# Patient Record
Sex: Female | Born: 1990 | Race: Black or African American | Hispanic: No | Marital: Married | Smoking: Never smoker
Health system: Southern US, Community
[De-identification: ages and names within clinical notes are randomized; demographics above are authoritative.]

## PROBLEM LIST (undated history)

## (undated) ENCOUNTER — Inpatient Hospital Stay (HOSPITAL_COMMUNITY): Payer: Self-pay

## (undated) DIAGNOSIS — I10 Essential (primary) hypertension: Secondary | ICD-10-CM

## (undated) HISTORY — PX: NO PAST SURGERIES: SHX2092

## (undated) HISTORY — PX: BONE MARROW TRANSPLANT: SHX200

---

## 2012-05-07 ENCOUNTER — Encounter (HOSPITAL_COMMUNITY): Payer: Self-pay | Admitting: *Deleted

## 2012-05-07 ENCOUNTER — Emergency Department (HOSPITAL_COMMUNITY)
Admission: EM | Admit: 2012-05-07 | Discharge: 2012-05-07 | Disposition: A | Discharge status: Home / Self Care | Payer: Self-pay | Attending: Emergency Medicine | Admitting: Emergency Medicine

## 2012-05-07 DIAGNOSIS — IMO0002 Reserved for concepts with insufficient information to code with codable children: Secondary | ICD-10-CM | POA: Insufficient documentation

## 2012-05-07 DIAGNOSIS — L0390 Cellulitis, unspecified: Secondary | ICD-10-CM

## 2012-05-07 MED ORDER — CEPHALEXIN 250 MG PO CAPS
500.0000 mg | ORAL_CAPSULE | Freq: Once | ORAL | Status: AC
  Administered 2012-05-07: 500 mg via ORAL
  Filled 2012-05-07: qty 2

## 2012-05-07 MED ORDER — CEPHALEXIN 500 MG PO CAPS: 500.0000 mg | ORAL_CAPSULE | Freq: Four times a day (QID) | ORAL | Status: DC

## 2012-05-07 NOTE — ED Provider Notes (Signed)
History   This chart was scribed for Diamond Robbins B. Bernette Mayers, MD, by Frederik Pear, ER scribe. The patient was seen in room TR06C/TR06C and the patient's care was started at 1341.    CSN: [id number]  Arrival date & time 05/07/12  1316   First MD Initiated Contact with Patient 05/07/12 1341      Chief Complaint  Patient presents with  . redness and swelling to right arm     (Consider location/radiation/quality/duration/timing/severity/associated sxs/prior treatment) HPI  Diamond Robbins is a 21 y.o. female who presents to the Emergency Department complaining of constant, moderate right forearm pain with associated erythema and swelling that began when she awoke. She denies any known insect bites or injuries to the area.She denies any chronic health conditions including DM or taking any daily medications She denies any allergies to medications.  History reviewed. No pertinent past medical history.  History reviewed. No pertinent past surgical history.  No family history on file.  History  Substance Use Topics  . Smoking status: Never Smoker   . Smokeless tobacco: Not on file  . Alcohol Use: No    OB History    Grav Para Term Preterm Abortions TAB SAB Ect Mult Living                  Review of Systems A complete 10 system review of systems was obtained and all systems are negative except as noted in the HPI and PMH.   Allergies  Review of patient's allergies indicates no known allergies.  Home Medications  No current outpatient prescriptions on file.  BP 155/105  Pulse 71  Temp 98.3 F (36.8 C) (Oral)  Resp 18  SpO2 99%  LMP 05/05/2012  Physical Exam  Constitutional: She is oriented to person, place, and time. She appears well-developed and well-nourished.  HENT:  Head: Normocephalic and atraumatic.  Neck: Neck supple.  Pulmonary/Chest: Effort normal.  Neurological: She is alert and oriented to person, place, and time. No cranial nerve deficit.  Skin:   She has an area that is erythemas and warm with no fluctuants or streaking to the right forearm.  Psychiatric: She has a normal mood and affect. Her behavior is normal.    ED Course  Procedures (including critical care time)  DIAGNOSTIC STUDIES: Oxygen Saturation is 99% on room air, normal by my interpretation.    COORDINATION OF CARE:  13:52- Discussed planned course of treatment with the patient, including a course of antibiotics and coming back to the ED if the symptoms worsen, who is agreeable at this time.   Labs Reviewed - No data to display No results found.   No diagnosis found.    MDM  Cellulitis of R forearm. Erythema margin is not well demarcated, but marked as best as we could tell. Will begin Keflex, advised ED follow up if not resolved.   I personally performed the services described in this documentation, which was scribed in my presence. The recorded information has been reviewed and is accurate.        Sostenes Kauffmann B. Bernette Mayers, MD 05/07/12 1357

## 2012-05-07 NOTE — ED Notes (Signed)
Pt is here with right forearm redness, swelling and pain that she woke up with.  Pulse is present.

## 2012-05-07 NOTE — Discharge Instructions (Signed)
Cellulitis Cellulitis is an infection of the skin and the tissue beneath it. The infected area is usually red and tender. Cellulitis occurs most often in the arms and lower legs.   CAUSES   Cellulitis is caused by bacteria that enter the skin through cracks or cuts in the skin. The most common types of bacteria that cause cellulitis are Staphylococcus and Streptococcus. SYMPTOMS    Redness and warmth.   Swelling.   Tenderness or pain.   Fever.  DIAGNOSIS  Your caregiver can usually determine what is wrong based on a physical exam. Blood tests may also be done. TREATMENT   Treatment usually involves taking an antibiotic medicine. HOME CARE INSTRUCTIONS    Take your antibiotics as directed. Finish them even if you start to feel better.   Keep the infected arm or leg elevated to reduce swelling.   Apply a warm cloth to the affected area up to 4 times per day to relieve pain.   Only take over-the-counter or prescription medicines for pain, discomfort, or fever as directed by your caregiver.   Keep all follow-up appointments as directed by your caregiver.  SEEK MEDICAL CARE IF:    You notice red streaks coming from the infected area.   Your red area gets larger or turns dark in color.   Your bone or joint underneath the infected area becomes painful after the skin has healed.   Your infection returns in the same area or another area.   You notice a swollen bump in the infected area.   You develop new symptoms.  SEEK IMMEDIATE MEDICAL CARE IF:    You have a fever.   You feel very sleepy.   You develop vomiting or diarrhea.   You have a general ill feeling (malaise) with muscle aches and pains.  MAKE SURE YOU:    Understand these instructions.   Will watch your condition.   Will get help right away if you are not doing well or get worse.  Document Released: 02/09/2005 Document Revised: 11/01/2011 Document Reviewed: 07/18/2011 [employer] Patient Information 2013  [employer]    

## 2013-03-07 ENCOUNTER — Encounter (HOSPITAL_COMMUNITY): Payer: Self-pay | Admitting: Emergency Medicine

## 2013-03-07 ENCOUNTER — Emergency Department (HOSPITAL_COMMUNITY)
Admission: EM | Admit: 2013-03-07 | Discharge: 2013-03-07 | Disposition: A | Discharge status: Home / Self Care | Payer: No Typology Code available for payment source | Attending: Emergency Medicine | Admitting: Emergency Medicine

## 2013-03-07 DIAGNOSIS — Y92410 Unspecified street and highway as the place of occurrence of the external cause: Secondary | ICD-10-CM | POA: Insufficient documentation

## 2013-03-07 DIAGNOSIS — M791 Myalgia, unspecified site: Secondary | ICD-10-CM

## 2013-03-07 DIAGNOSIS — Y939 Activity, unspecified: Secondary | ICD-10-CM | POA: Insufficient documentation

## 2013-03-07 DIAGNOSIS — M546 Pain in thoracic spine: Secondary | ICD-10-CM | POA: Insufficient documentation

## 2013-03-07 MED ORDER — IBUPROFEN 400 MG PO TABS
800.0000 mg | ORAL_TABLET | Freq: Once | ORAL | Status: AC
  Administered 2013-03-07: 800 mg via ORAL
  Filled 2013-03-07: qty 2

## 2013-03-07 MED ORDER — NAPROXEN 500 MG PO TABS: 500.0000 mg | ORAL_TABLET | Freq: Two times a day (BID) | ORAL | Status: DC

## 2013-03-07 MED ORDER — CYCLOBENZAPRINE HCL 10 MG PO TABS: 10.0000 mg | ORAL_TABLET | Freq: Two times a day (BID) | ORAL | Status: DC | PRN
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
## 2013-03-08 NOTE — ED Provider Notes (Signed)
Medical screening examination/treatment/procedure(s) were performed by non-physician practitioner and as supervising physician I was immediately available for consultation/collaboration.  EKG Interpretation   None         Layla Maw Raiyah Speakman, DO 03/08/13 2300

## 2016-05-31 ENCOUNTER — Encounter (HOSPITAL_COMMUNITY): Payer: Self-pay | Admitting: *Deleted

## 2016-06-23 ENCOUNTER — Ambulatory Visit (HOSPITAL_COMMUNITY): Payer: Self-pay

## 2016-07-07 ENCOUNTER — Ambulatory Visit (HOSPITAL_COMMUNITY): Payer: Self-pay

## 2016-07-11 ENCOUNTER — Encounter: Payer: Self-pay | Admitting: Obstetrics and Gynecology

## 2016-08-03 ENCOUNTER — Telehealth (HOSPITAL_COMMUNITY): Payer: Self-pay | Admitting: *Deleted

## 2016-08-03 NOTE — Telephone Encounter (Signed)
Telephoned patient at home number and left message to return call to [facility] 

## 2016-09-06 ENCOUNTER — Encounter (HOSPITAL_COMMUNITY): Payer: Self-pay

## 2016-09-06 ENCOUNTER — Ambulatory Visit (HOSPITAL_COMMUNITY)
Admission: RE | Admit: 2016-09-06 | Discharge: 2016-09-06 | Disposition: A | Discharge status: Home / Self Care | Payer: Self-pay | Source: Ambulatory Visit | Attending: Obstetrics and Gynecology | Admitting: Obstetrics and Gynecology

## 2016-09-06 VITALS — BP 160/100 | Ht 64.0 in | Wt 169.0 lb

## 2016-09-06 DIAGNOSIS — R87810 Cervical high risk human papillomavirus (HPV) DNA test positive: Secondary | ICD-10-CM

## 2016-09-06 DIAGNOSIS — Z1239 Encounter for other screening for malignant neoplasm of breast: Secondary | ICD-10-CM

## 2016-09-06 DIAGNOSIS — R87610 Atypical squamous cells of undetermined significance on cytologic smear of cervix (ASC-US): Secondary | ICD-10-CM

## 2016-09-06 HISTORY — DX: Essential (primary) hypertension: I10

## 2016-09-06 NOTE — Patient Instructions (Signed)
Explained breast self awareness with [name]. Patient did not need a Pap smear today due to last Pap smear was 05/18/2016. Patient referred to the Center for [facility] at [facility] for a colpscopy. Appointment scheduled for Tuesday, Sep 13, 2016 at 1440. Patient aware of appointment and will be there. Let patient know that a screening mammogram is recommended at age 26 unless clinically indicated prior.  Diamond Robbins verbalized understanding.  [name], Arvil Chaco, RN 3:17 PM

## 2016-09-06 NOTE — Progress Notes (Signed)
Patient referred to [place] by the [facility] Department due to having an abnormal Pap smear 05/18/2016 that a colposcopy is recommended for follow up.   Pap Smear: Pap smear not completed today. Last Pap smear was 05/18/2016 at the [facility] Department and ASCUS with positive HPV. Patient referred to the Center for [facility] at [facility] for a colpscopy. Appointment scheduled for Tuesday, Sep 13, 2016 at 1440. Per patient has no history of abnormal Pap smears prior to the most recent Pap smear. Last Pap smear result is in EPIC.  Physical exam: Breasts Breasts symmetrical. No skin abnormalities bilateral breasts. No nipple retraction bilateral breasts. No nipple discharge bilateral breasts. No lymphadenopathy. No lumps palpated bilateral breasts. No complaints of pain or tenderness on exam. Screening mammogram recommended at age 26 unless clinically indicated prior.     Pelvic/Bimanual No Pap smear completed today since last Pap smear was 05/18/2016. Pap smear not indicated per [facility] guidelines.   Smoking History: Patient has never smoked.  Patient Navigation: Patient education provided. Access to services provided for patient through [facility] program.

## 2016-09-07 ENCOUNTER — Encounter (HOSPITAL_COMMUNITY): Payer: Self-pay | Admitting: *Deleted

## 2016-09-13 ENCOUNTER — Ambulatory Visit: Payer: Self-pay | Admitting: Obstetrics and Gynecology

## 2016-10-11 ENCOUNTER — Ambulatory Visit: Payer: Self-pay | Admitting: Family Medicine

## 2016-11-30 ENCOUNTER — Encounter (HOSPITAL_COMMUNITY): Payer: Self-pay | Admitting: *Deleted

## 2016-11-30 NOTE — Progress Notes (Signed)
Letter mailed to patient concerning missed colpo appointment.

## 2019-03-30 ENCOUNTER — Emergency Department (HOSPITAL_COMMUNITY)
Admission: EM | Admit: 2019-03-30 | Discharge: 2019-03-30 | Disposition: A | Discharge status: Home / Self Care | Payer: Medicaid Other | Attending: Emergency Medicine | Admitting: Emergency Medicine

## 2019-03-30 ENCOUNTER — Encounter (HOSPITAL_COMMUNITY): Payer: Self-pay | Admitting: Emergency Medicine

## 2019-03-30 ENCOUNTER — Other Ambulatory Visit: Payer: Self-pay

## 2019-03-30 DIAGNOSIS — S3992XA Unspecified injury of lower back, initial encounter: Secondary | ICD-10-CM | POA: Diagnosis present

## 2019-03-30 DIAGNOSIS — M546 Pain in thoracic spine: Secondary | ICD-10-CM | POA: Insufficient documentation

## 2019-03-30 DIAGNOSIS — I10 Essential (primary) hypertension: Secondary | ICD-10-CM | POA: Diagnosis not present

## 2019-03-30 DIAGNOSIS — S39012A Strain of muscle, fascia and tendon of lower back, initial encounter: Secondary | ICD-10-CM | POA: Insufficient documentation

## 2019-03-30 DIAGNOSIS — Z79899 Other long term (current) drug therapy: Secondary | ICD-10-CM | POA: Insufficient documentation

## 2019-03-30 DIAGNOSIS — W01198A Fall on same level from slipping, tripping and stumbling with subsequent striking against other object, initial encounter: Secondary | ICD-10-CM | POA: Diagnosis not present

## 2019-03-30 DIAGNOSIS — W19XXXA Unspecified fall, initial encounter: Secondary | ICD-10-CM | POA: Diagnosis not present

## 2019-03-30 DIAGNOSIS — Y92008 Other place in unspecified non-institutional (private) residence as the place of occurrence of the external cause: Secondary | ICD-10-CM | POA: Diagnosis not present

## 2019-03-30 DIAGNOSIS — Y9389 Activity, other specified: Secondary | ICD-10-CM | POA: Insufficient documentation

## 2019-03-30 DIAGNOSIS — Y999 Unspecified external cause status: Secondary | ICD-10-CM | POA: Diagnosis not present

## 2019-03-30 DIAGNOSIS — M545 Low back pain: Secondary | ICD-10-CM | POA: Diagnosis not present

## 2019-03-30 DIAGNOSIS — R52 Pain, unspecified: Secondary | ICD-10-CM | POA: Diagnosis not present

## 2019-03-30 DIAGNOSIS — T148XXA Other injury of unspecified body region, initial encounter: Secondary | ICD-10-CM

## 2019-03-30 MED ORDER — KETOROLAC TROMETHAMINE 60 MG/2ML IM SOLN
30.0000 mg | Freq: Once | INTRAMUSCULAR | Status: AC
  Administered 2019-03-30: 30 mg via INTRAMUSCULAR
  Filled 2019-03-30: qty 2

## 2019-03-30 NOTE — ED Triage Notes (Signed)
Patient here from home with complaints of lower back pain. Reports that she slipped on water in kitchen.

## 2019-03-30 NOTE — ED Provider Notes (Signed)
[facility] DEPT Provider Note  CSN: [id number] Arrival date & time: 03/30/19 0126  Chief Complaint(s) Back Pain  HPI Diamond Robbins is a 28 y.o. female who presents to the emergency department with mid and lower back pain following mechanical fall at home.  Patient reports that she slipped on wet floor causing her to fall backwards.  Pain is aching in nature, moderate to severe.  Exacerbated with movement and palpation.  Localized to bilateral paraspinal musculature.  No lower extremity weakness or loss of sensation.  No pain radiation to the lower extremities.  No bladder/bowel incontinence.  Patient has not tried taking any medicine for the pain.  Denied any head trauma or loss of consciousness.  No other physical complaints.  HPI  Past Medical History Past Medical History:  Diagnosis Date  . Hypertension    There are no active problems to display for this patient.  Home Medication(s) Prior to Admission medications   Medication Sig Start Date End Date Taking? Authorizing Provider  ibuprofen (ADVIL) 200 MG tablet Take 400 mg by mouth every 6 (six) hours as needed for moderate pain.   Yes [provider]  cyclobenzaprine (FLEXERIL) 10 MG tablet Take 1 tablet (10 mg total) by mouth 2 (two) times daily as needed for muscle spasms. Patient not taking: Reported on 09/06/2016 03/07/13   Sciacca, Mable Fill, PA-C  naproxen (NAPROSYN) 500 MG tablet Take 1 tablet (500 mg total) by mouth 2 (two) times daily. Patient not taking: Reported on 09/06/2016 03/07/13   Jamse Mead, PA-C                                                                                                                                    Past Surgical History History reviewed. No pertinent surgical history. Family History Family History  Problem Relation Age of Onset  . Hypertension Mother   . Hypertension Father   . Diabetes Maternal Grandmother     Social History Social  History   Tobacco Use  . Smoking status: Never Smoker  . Smokeless tobacco: Never Used  Substance Use Topics  . Alcohol use: Yes    Comment: occ  . Drug use: No   Allergies Patient has no known allergies.  Review of Systems Review of Systems All other systems are reviewed and are negative for acute change except as noted in the HPI  Physical Exam Vital Signs  I have reviewed the triage vital signs BP (!) 168/108 (BP Location: Left Arm)   Pulse 93   Temp 98.4 F (36.9 C) (Oral)   Resp 20   SpO2 100%   Physical Exam Vitals signs reviewed.  Constitutional:      General: She is not in acute distress.    Appearance: She is well-developed. She is not diaphoretic.  HENT:     Head: Normocephalic and atraumatic.     Right Ear: External ear normal.  Left Ear: External ear normal.     Nose: Nose normal.  Eyes:     General: No scleral icterus.    Conjunctiva/sclera: Conjunctivae normal.  Neck:     Musculoskeletal: Normal range of motion.     Trachea: Phonation normal.  Cardiovascular:     Rate and Rhythm: Normal rate and regular rhythm.  Pulmonary:     Effort: Pulmonary effort is normal. No respiratory distress.     Breath sounds: No stridor.  Abdominal:     General: There is no distension.  Musculoskeletal: Normal range of motion.     Thoracic back: She exhibits tenderness and spasm. She exhibits no bony tenderness.     Lumbar back: She exhibits tenderness and spasm. She exhibits no bony tenderness.       Back:  Neurological:     Mental Status: She is alert and oriented to person, place, and time.     Comments: Spine Exam: Strength: 5/5 throughout LE bilaterally (hip flexion/extension, adduction/abduction; knee flexion/extension; foot dorsiflexion/plantarflexion, inversion/eversion; great toe inversion) Sensation: Intact to light touch in proximal and distal LE bilaterally Reflexes: 1+ quadriceps and achilles reflexes   Psychiatric:        Behavior: Behavior  normal.     ED Results and Treatments Labs (all labs ordered are listed, but only abnormal results are displayed) Labs Reviewed - No data to display                                                                                                                       EKG  EKG Interpretation  Date/Time:    Ventricular Rate:    PR Interval:    QRS Duration:   QT Interval:    QTC Calculation:   R Axis:     Text Interpretation:        Radiology No results found.  Pertinent labs & imaging results that were available during my care of the patient were reviewed by me and considered in my medical decision making (see chart for details).  Medications Ordered in ED Medications  ketorolac (TORADOL) injection 30 mg (30 mg Intramuscular Given 03/30/19 0303)                                                                                                                                    Procedures Procedures  (including critical care time)  Medical Decision Making / ED Course I  have reviewed the nursing notes for this encounter and the patient's prior records (if available in EHR or on provided paperwork).   Diamond Robbins was evaluated in Emergency Department on 03/30/2019 for the symptoms described in the history of present illness. She was evaluated in the context of the global COVID-19 pandemic, which necessitated consideration that the patient might be at risk for infection with the SARS-CoV-2 virus that causes COVID-19. Institutional protocols and algorithms that pertain to the evaluation of patients at risk for COVID-19 are in a state of rapid change based on information released by regulatory bodies including the CDC and federal and state organizations. These policies and algorithms were followed during the patient's care in the ED.  28 y.o. female presents with back pain in thoracolumbar area for 2 hrs without signs of radicular pain.  No red flag symptoms of fever, weight loss,  saddle anesthesia, weakness, fecal/urinary incontinence or urinary retention.   Suspect muscular etiology. No indication for imaging emergently.   IM toradol provided with near complete resolution of pain. Sleeping comfortably on reassessment.  Patient was recommended to take short course of scheduled NSAIDs and engage in early mobility as definitive treatment. Return precautions discussed for worsening or new concerning symptoms.        Final Clinical Impression(s) / ED Diagnoses Final diagnoses:  Fall, initial encounter  Muscle strain    The patient appears reasonably screened and/or stabilized for discharge and I doubt any other medical condition or other [facility] requiring further screening, evaluation, or treatment in the ED at this time prior to discharge.  Disposition: Discharge  Condition: Good  I have discussed the results, Dx and Tx plan with the patient who expressed understanding and agree(s) with the plan. Discharge instructions discussed at great length. The patient was given strict return precautions who verbalized understanding of the instructions. No further questions at time of discharge.    ED Discharge Orders    None        Follow Up: Primary care provider  Schedule an appointment as soon as possible for a visit  As needed      This chart was dictated using voice recognition software.  Despite best efforts to proofread,  errors can occur which can change the documentation meaning.   Fatima Blank, MD 03/30/19 [phone]

## 2019-03-30 NOTE — Discharge Instructions (Addendum)
You may use over-the-counter Motrin (Ibuprofen), Acetaminophen (Tylenol), topical muscle creams such as SalonPas, Icy Hot, Bengay, etc. Please stretch, apply heat, and have massage therapy for additional assistance. ° °

## 2019-05-24 ENCOUNTER — Inpatient Hospital Stay (HOSPITAL_COMMUNITY): Payer: Medicaid Other

## 2019-05-24 ENCOUNTER — Other Ambulatory Visit: Payer: Self-pay

## 2019-05-24 ENCOUNTER — Encounter (HOSPITAL_COMMUNITY): Payer: Self-pay | Admitting: Obstetrics and Gynecology

## 2019-05-24 ENCOUNTER — Inpatient Hospital Stay (HOSPITAL_COMMUNITY)
Admission: AD | Admit: 2019-05-24 | Discharge: 2019-05-25 | Disposition: A | Discharge status: Home / Self Care | Payer: Medicaid Other | Attending: Obstetrics and Gynecology | Admitting: Obstetrics and Gynecology

## 2019-05-24 DIAGNOSIS — O209 Hemorrhage in early pregnancy, unspecified: Secondary | ICD-10-CM | POA: Insufficient documentation

## 2019-05-24 DIAGNOSIS — Z8249 Family history of ischemic heart disease and other diseases of the circulatory system: Secondary | ICD-10-CM | POA: Diagnosis not present

## 2019-05-24 DIAGNOSIS — O200 Threatened abortion: Secondary | ICD-10-CM | POA: Diagnosis not present

## 2019-05-24 DIAGNOSIS — Z3A01 Less than 8 weeks gestation of pregnancy: Secondary | ICD-10-CM | POA: Diagnosis not present

## 2019-05-24 DIAGNOSIS — O10911 Unspecified pre-existing hypertension complicating pregnancy, first trimester: Secondary | ICD-10-CM | POA: Insufficient documentation

## 2019-05-24 DIAGNOSIS — Z79899 Other long term (current) drug therapy: Secondary | ICD-10-CM | POA: Insufficient documentation

## 2019-05-24 DIAGNOSIS — O3411 Maternal care for benign tumor of corpus uteri, first trimester: Secondary | ICD-10-CM | POA: Diagnosis not present

## 2019-05-24 DIAGNOSIS — O4690 Antepartum hemorrhage, unspecified, unspecified trimester: Secondary | ICD-10-CM

## 2019-05-24 DIAGNOSIS — Z3A10 10 weeks gestation of pregnancy: Secondary | ICD-10-CM | POA: Insufficient documentation

## 2019-05-24 DIAGNOSIS — A599 Trichomoniasis, unspecified: Secondary | ICD-10-CM

## 2019-05-24 DIAGNOSIS — E1059 Type 1 diabetes mellitus with other circulatory complications: Secondary | ICD-10-CM | POA: Diagnosis present

## 2019-05-24 DIAGNOSIS — D252 Subserosal leiomyoma of uterus: Secondary | ICD-10-CM | POA: Diagnosis not present

## 2019-05-24 DIAGNOSIS — I10 Essential (primary) hypertension: Secondary | ICD-10-CM | POA: Diagnosis present

## 2019-05-24 DIAGNOSIS — I152 Hypertension secondary to endocrine disorders: Secondary | ICD-10-CM | POA: Diagnosis present

## 2019-05-24 LAB — CBC
HCT: 32.7 % — ABNORMAL LOW (ref 36.0–46.0)
Hemoglobin: 10.7 g/dL — ABNORMAL LOW (ref 12.0–15.0)
MCH: 27.8 pg (ref 26.0–34.0)
MCHC: 32.7 g/dL (ref 30.0–36.0)
MCV: 84.9 fL (ref 80.0–100.0)
Platelets: 291 10*3/uL (ref 150–400)
RBC: 3.85 MIL/uL — ABNORMAL LOW (ref 3.87–5.11)
RDW: 15.3 % (ref 11.5–15.5)
WBC: 8.7 10*3/uL (ref 4.0–10.5)
nRBC: 0 % (ref 0.0–0.2)

## 2019-05-24 LAB — URINALYSIS, ROUTINE W REFLEX MICROSCOPIC
Bilirubin Urine: NEGATIVE
Glucose, UA: 150 mg/dL — AB
Ketones, ur: 5 mg/dL — AB
Leukocytes,Ua: NEGATIVE
Nitrite: NEGATIVE
Protein, ur: 100 mg/dL — AB
RBC / HPF: >50 RBC/hpf — ABNORMAL HIGH (ref 0–5)
Specific Gravity, Urine: 1.027 (ref 1.005–1.030)
pH: 6 (ref 5.0–8.0)

## 2019-05-24 LAB — WET PREP, GENITAL
Sperm: NONE SEEN
Yeast Wet Prep HPF POC: NONE SEEN

## 2019-05-24 LAB — ABO/RH: ABO/RH(D): B POS

## 2019-05-24 LAB — HCG, QUANTITATIVE, PREGNANCY: hCG, Beta Chain, Quant, S: 1104 m[IU]/mL — ABNORMAL HIGH (ref <5)

## 2019-05-24 LAB — POCT PREGNANCY, URINE: Preg Test, Ur: POSITIVE — AB

## 2019-05-24 MED ORDER — LABETALOL HCL 100 MG PO TABS
200.0000 mg | ORAL_TABLET | Freq: Once | ORAL | Status: AC
  Administered 2019-05-24: 200 mg via ORAL
  Filled 2019-05-24: qty 2

## 2019-05-24 MED ORDER — METRONIDAZOLE 500 MG PO TABS
2000.0000 mg | ORAL_TABLET | Freq: Once | ORAL | Status: AC
  Administered 2019-05-24: 2000 mg via ORAL
  Filled 2019-05-24: qty 4

## 2019-05-24 NOTE — MAU Note (Addendum)
Has 1st appt with Ione on 05-31-2019.  Pink spotting x2 days then today at 7:30 pm, crampy pains and then went to bathroom and bleeding increased like starting period.  Takes Labetalol 100 mg twice a day - started in November - prescribed by Urgent Care.  Took her morning dose but hasn't took evening dose yet.

## 2019-05-24 NOTE — MAU Provider Note (Addendum)
History     CSN: [id number]  Arrival date and time: 05/24/19 2036   First Provider Initiated Contact with Patient 05/24/19 2211      Chief Complaint  Patient presents with  . Vaginal Bleeding   Diamond Robbins is a 29 y.o. G1P0 at 10.1 by Definite LMP weeks who plans to receive care at [facility] starting May 31, 2019.  She presents today for Vaginal Bleeding.  She states she was having dinner with her BF and started having stomach pain.  She states she went to the bathroom and noticed blood in the toilet.  She states that the amount was similar to starting a cycle.  She states the pain was located in her lower abdomen and is described as an "intense cramp."  She states the pain was "continuous for awhile and has eased up and now goes and comes and is real mild right now."  Patient rates pain a 3/10 currently, but reports it was a 7/10 when the bleeding started.  She denies vaginal discharge prior to the bleeding and has had no sexual activity in the past 3 days.  She also denies visual disturbances, headaches, or SOB. Patient endorses a history of CHTN and states she is on labetalol 100mg  bid.  She reports she took her initial dose at 0800, but has not taken her 2nd dose this evening.       OB History    Gravida  1   Para  0   Term  0   Preterm  0   AB  0   Living  0     SAB  0   TAB  0   Ectopic  0   Multiple  0   Live Births  0           Past Medical History:  Diagnosis Date  . Hypertension     Past Surgical History:  Procedure Laterality Date  . NO PAST SURGERIES      Family History  Problem Relation Age of Onset  . Hypertension Mother   . Hypertension Father   . Diabetes Maternal Grandmother     Social History   Tobacco Use  . Smoking status: Never Smoker  . Smokeless tobacco: Never Used  Substance Use Topics  . Alcohol use: Not Currently    Comment: occ  . Drug use: No    Allergies: No Known Allergies  Medications Prior to  Admission  Medication Sig Dispense Refill Last Dose  . labetalol (NORMODYNE) 100 MG tablet Take 100 mg by mouth 2 (two) times daily.   05/24/2019 at Unknown time  . Prenatal Vit-Fe Fumarate-FA (PRENATAL MULTIVITAMIN) TABS tablet Take 1 tablet by mouth daily at 12 noon.   05/24/2019 at Unknown time  . cyclobenzaprine (FLEXERIL) 10 MG tablet Take 1 tablet (10 mg total) by mouth 2 (two) times daily as needed for muscle spasms. (Patient not taking: Reported on 09/06/2016) 20 tablet 0   . ibuprofen (ADVIL) 200 MG tablet Take 400 mg by mouth every 6 (six) hours as needed for moderate pain.     . naproxen (NAPROSYN) 500 MG tablet Take 1 tablet (500 mg total) by mouth 2 (two) times daily. (Patient not taking: Reported on 09/06/2016) 30 tablet 0     Review of Systems  Constitutional: Negative for chills and fever.  Eyes: Negative for visual disturbance.  Respiratory: Negative for cough and shortness of breath.   Gastrointestinal: Positive for abdominal pain. Negative for constipation, diarrhea,  nausea and vomiting.  Genitourinary: Positive for vaginal bleeding. Negative for difficulty urinating, dyspareunia, dysuria, pelvic pain and vaginal discharge.  Musculoskeletal: Negative for back pain.  Neurological: Negative for dizziness, light-headedness and headaches.   Physical Exam   Blood pressure (!) 160/110, pulse 99, temperature 98.8 F (37.1 C), temperature source Oral, resp. rate 16, height 5\' 4"  (1.626 m), weight 83.9 kg, last menstrual period 03/14/2019, SpO2 98 %.   Vitals:   05/24/19 2316 05/24/19 2320 05/24/19 2340 05/25/19 0018  BP: (!) 145/99 (!) 143/89 (!) 159/108 (!) 135/95  Pulse: 94 96 (!) 102 (!) 107  Resp:    16  Temp:    98.6 F (37 C)  TempSrc:    Oral  SpO2:      Weight:      Height:         Physical Exam  Constitutional: She is oriented to person, place, and time. She appears well-developed and well-nourished. No distress.  HENT:  Head: Normocephalic and atraumatic.   Eyes: Conjunctivae are normal.  Cardiovascular: Normal rate.  Respiratory: Effort normal.  GI: Soft. She exhibits no mass. There is no abdominal tenderness. There is no guarding.  Genitourinary: Cervix exhibits friability.    Vaginal bleeding present.  There is bleeding in the vagina.    Genitourinary Comments: Speculum Exam: -Normal External Genitalia: Non tender, Questionable gestational sac noted introitus. Collected and sent to pathology. -Vaginal Vault: Pink mucosa with good rugae. Small amt blood noted and removed with faux swab x 2 -wet prep collected -Cervix:Pink, no lesions, cysts, or polyps.  Appears closed. Bleeding from os wiped away with faux swab with some bleeding from external area of cervix noted-GC/CT collected -Bimanual Exam:  No tenderness noted.  Uterus size difficult to assess, but not c/w [redacted]wk GA.    Musculoskeletal:        General: Normal range of motion.     Cervical back: Normal range of motion.  Neurological: She is alert and oriented to person, place, and time.  Skin: Skin is warm and dry.  Psychiatric: She has a normal mood and affect. Her behavior is normal.    MAU Course  Procedures Results for orders placed or performed during the hospital encounter of 05/24/19 (from the past 24 hour(s))  Pregnancy, urine POC     Status: Abnormal   Collection Time: 05/24/19  9:29 PM  Result Value Ref Range   Preg Test, Ur POSITIVE (A) NEGATIVE  Urinalysis, Routine w reflex microscopic     Status: Abnormal   Collection Time: 05/24/19  9:36 PM  Result Value Ref Range   Color, Urine YELLOW YELLOW   APPearance CLOUDY (A) CLEAR   Specific Gravity, Urine 1.027 1.005 - 1.030   pH 6.0 5.0 - 8.0   Glucose, UA 150 (A) NEGATIVE mg/dL   Hgb urine dipstick LARGE (A) NEGATIVE   Bilirubin Urine NEGATIVE NEGATIVE   Ketones, ur 5 (A) NEGATIVE mg/dL   Protein, ur 100 (A) NEGATIVE mg/dL   Nitrite NEGATIVE NEGATIVE   Leukocytes,Ua NEGATIVE NEGATIVE   RBC / HPF >50 (H) 0 - 5  RBC/hpf   WBC, UA 11-20 0 - 5 WBC/hpf   Bacteria, UA RARE (A) NONE SEEN   Mucus PRESENT   Wet prep, genital     Status: Abnormal   Collection Time: 05/24/19 10:19 PM   Specimen: Cervix  Result Value Ref Range   Yeast Wet Prep HPF POC NONE SEEN NONE SEEN   Trich, Wet Prep PRESENT (A) NONE  SEEN   Clue Cells Wet Prep HPF POC PRESENT (A) NONE SEEN   WBC, Wet Prep HPF POC MANY (A) NONE SEEN   Sperm NONE SEEN   ABO/Rh     Status: None   Collection Time: 05/24/19 10:32 PM  Result Value Ref Range   ABO/RH(D) B POS    No rh immune globuloin      NOT A RH IMMUNE GLOBULIN CANDIDATE, PT RH POSITIVE Performed at [facility] [address] [place],  [address]   CBC     Status: Abnormal   Collection Time: 05/24/19 10:32 PM  Result Value Ref Range   WBC 8.7 4.0 - 10.5 K/uL   RBC 3.85 (L) 3.87 - 5.11 MIL/uL   Hemoglobin 10.7 (L) 12.0 - 15.0 g/dL   HCT 32.7 (L) 36.0 - 46.0 %   MCV 84.9 80.0 - 100.0 fL   MCH 27.8 26.0 - 34.0 pg   MCHC 32.7 30.0 - 36.0 g/dL   RDW 15.3 11.5 - 15.5 %   Platelets 291 150 - 400 K/uL   nRBC 0.0 0.0 - 0.2 %  hCG, quantitative, pregnancy     Status: Abnormal   Collection Time: 05/24/19 10:32 PM  Result Value Ref Range   hCG, Beta Chain, Quant, S 1,104 (H) <5 mIU/mL   US OB LESS THAN 14 WEEKS WITH OB TRANSVAGINAL  Result Date: 05/24/2019 CLINICAL DATA:  Vaginal bleeding with positive urine pregnancy test EXAM: OBSTETRIC <14 WK [place] AND TRANSVAGINAL OB US TECHNIQUE: Both transabdominal and transvaginal ultrasound examinations were performed for complete evaluation of the gestation as well as the maternal uterus, adnexal regions, and pelvic cul-de-sac. Transvaginal technique was performed to assess early pregnancy. COMPARISON:  None. FINDINGS: Intrauterine gestational sac: Irregular fluid collection within the uterus. Yolk sac:  Not Visualized. Embryo:  Not Visualized. MSD:  8.07 mm 5 weeks 4 days Maternal uterus/adnexae: Posterior subserosal uterine fibroid  measuring 2.7 by 2.3 x 1.7 cm. Anterior fundal intramural fibroid measuring 3.6 x 2.2 x 2.3 cm. No significant free fluid. Ovaries are within normal limits. Right ovary contains corpus luteum and measures 2.5 by 3.8 x 2.3 cm. Left ovary measures 1.8 x 2.4 x 1.8 cm. IMPRESSION: Irregular fluid collection within the uterus is questionable for intrauterine gestational sac. Negative for yolk sac or embryo. Recommend trending of HCG and potential ultrasound follow-up. Uterine fibroids Electronically Signed   By: Donavan Foil M.D.   On: 05/24/2019 23:16    MDM Physical Exam Pelvic Exam; Wet Prep and GC/CT Labs: UA, UPT, CBC, hCG, ABO Ultrasound Assessment and Plan  29 year old G1P0 at 10.1weeks CHTN Vaginal Bleeding  -Given Labetalol 200mg  after initial review of vitals.  -Once at bedside, extensive discussion on need to follow up with primary care provider regarding management of CHTN. -Reviewed POC regarding vaginal bleeding. -Exam performed and findings discussed.  -Cultures collected and pending.  -Informed that questionable gestational sac noted at introitus and will be sent to pathology.  -Labs ordered. -Will send for [place]. -Patient offered and declines pain medication. -Will await results.   Maryann Conners 05/24/2019, 10:11 PM   Reassessment (11:40 PM) Irregular IUGS Trichomoniasis Threatened Abortion  -[place] results as above. -Reviewed how findings not c/w definite LMP. -Discussed need for follow up in 48 hours for repeat lab work. -Scheduled for Monday afternoon at 2pm in [place] office. -Patient informed of need keep appt.   -Informed of trichomoniasis diagnosis including what it is and how to treat  it. -Patient agreeable to EPT and script given for Delila Pereyra II DOB 01/03/1987. -Discussed need to abstain from sex until at least 10 days s/p partner treatment. -Bleeding Precautions given. -Improvement of blood pressures with labetalol 200mg  dosing. -Will send script for  modification in therapy from 100 to 200mg  BID. -Reiterated need for PCP if pregnancy does not got to fruition for monitoring of blood pressure and mgmt of medications.  -Encouraged to call or return to MAU if symptoms worsen or with the onset of new symptoms. -Discharged to home in stable condition.  Maryann Conners MSN, CNM Advanced Practice Provider, [facility]

## 2019-05-25 MED ORDER — LABETALOL HCL 200 MG PO TABS: 200.0000 mg | ORAL_TABLET | Freq: Two times a day (BID) | ORAL | 3 refills | Status: DC

## 2019-05-25 NOTE — Discharge Instructions (Signed)
Threatened Miscarriage  A threatened miscarriage occurs when a woman has vaginal bleeding during the first 20 weeks of pregnancy but the pregnancy has not ended. If you have vaginal bleeding during this time, your health care provider will do tests to make sure you are still pregnant. If the tests show that you are still pregnant and that the developing baby (fetus) inside your uterus is still growing, your condition is considered a threatened miscarriage. A threatened miscarriage does not mean your pregnancy will end, but it does increase the risk of losing your pregnancy (complete miscarriage). What are the causes? The cause of this condition is usually not known. For women who go on to have a complete miscarriage, the most common cause is an abnormal number of chromosomes in the developing baby. Chromosomes are the structures inside cells that hold all of a person's genetic material. What increases the risk? The following lifestyle factors may increase your risk of a miscarriage in early pregnancy:  Smoking.  Drinking excessive amounts of alcohol or caffeine.  Recreational drug use. The following preexisting health conditions may increase your risk of a miscarriage in early pregnancy:  Polycystic ovary syndrome.  Uterine fibroids.  Infections.  Diabetes mellitus. What are the signs or symptoms? Symptoms of this condition include:  Vaginal bleeding.  Mild abdominal pain or cramps. How is this diagnosed? If you have bleeding with or without abdominal pain before 20 weeks of pregnancy, your health care provider will do tests to check whether you are still pregnant. These will include:  Ultrasound. This test uses sound waves to create images of the inside of your uterus. This allows your health care provider to look at your developing baby and other structures, such as your placenta.  Pelvic exam. This is an internal exam of your vagina and cervix.  Measurement of your baby's heart  rate.  Laboratory tests such as blood tests, urine tests, or swabs for infection You may be diagnosed with a threatened miscarriage if:  Ultrasound testing shows that you are still pregnant.  Your baby's heart rate is strong.  A pelvic exam shows that the opening between your uterus and your vagina (cervix) is closed.  Blood tests confirm that you are still pregnant. How is this treated? No treatments have been shown to prevent a threatened miscarriage from going on to a complete miscarriage. However, the right home care is important. Follow these instructions at home:  Get plenty of rest.  Do not have sex or use tampons if you have vaginal bleeding.  Do not douche.  Do not smoke or use recreational drugs.  Do not drink alcohol.  Avoid caffeine.  Keep all follow-up prenatal visits as told by your health care provider. This is important. Contact a health care provider if:  You have light vaginal bleeding or spotting while pregnant.  You have abdominal pain or cramping.  You have a fever. Get help right away if:  You have heavy vaginal bleeding.  You have blood clots coming from your vagina.  You pass tissue from your vagina.  You leak fluid, or you have a gush of fluid from your vagina.  You have severe low back pain or abdominal cramps.  You have fever, chills, and severe abdominal pain. Summary  A threatened miscarriage occurs when a woman has vaginal bleeding during the first 20 weeks of pregnancy but the pregnancy has not ended.  The cause of a threatened miscarriage is usually not known.  Symptoms of this condition may   include vaginal bleeding and mild abdominal pain or cramps.  No treatments have been shown to prevent a threatened miscarriage from going on to a complete miscarriage.  Keep all follow-up prenatal visits as told by your health care provider. This is important. This information is not intended to replace advice given to you by your health  care provider. Make sure you discuss any questions you have with your health care provider. Document Revised: 06/08/2017 Document Reviewed: 07/29/2016 Elsevier Patient Education  2020 Elsevier Inc.  

## 2019-05-27 ENCOUNTER — Ambulatory Visit (INDEPENDENT_AMBULATORY_CARE_PROVIDER_SITE_OTHER): Payer: Medicaid Other | Admitting: *Deleted

## 2019-05-27 ENCOUNTER — Other Ambulatory Visit: Payer: Self-pay

## 2019-05-27 ENCOUNTER — Encounter: Payer: Self-pay | Admitting: Obstetrics and Gynecology

## 2019-05-27 ENCOUNTER — Telehealth: Payer: Self-pay | Admitting: *Deleted

## 2019-05-27 DIAGNOSIS — Z3490 Encounter for supervision of normal pregnancy, unspecified, unspecified trimester: Secondary | ICD-10-CM

## 2019-05-27 DIAGNOSIS — O3680X0 Pregnancy with inconclusive fetal viability, not applicable or unspecified: Secondary | ICD-10-CM | POA: Insufficient documentation

## 2019-05-27 LAB — BETA HCG QUANT (REF LAB): hCG Quant: 59 m[IU]/mL

## 2019-05-27 LAB — GC/CHLAMYDIA PROBE AMP (~~LOC~~) NOT AT ARMC
Chlamydia: NEGATIVE
Comment: NEGATIVE
Comment: NORMAL
Neisseria Gonorrhea: NEGATIVE

## 2019-05-27 LAB — SURGICAL PATHOLOGY

## 2019-05-27 NOTE — Progress Notes (Addendum)
Pt was seen MAU on 1/8 with abd pain and bleeding. Still having some pain in lower abd that is achy, crampy and rates as 3. States bleeding is less than has been. Here for repeat BHCG. Understands we will get results today and call her with result and plan of care. Understanding voiced 1615 BHCG results called in of 43. Results discussed with Dr Nehemiah Settle. Pt not to get pregnant for 5months. Make appt with office if she would like birth control. Return to office if she does not have a period in 6wks. Will call pt with results and give her above info from Dr Nehemiah Settle

## 2019-05-27 NOTE — Addendum Note (Signed)
Addended by: Kandyce Rud on: 05/27/2019 02:52 PM   Modules accepted: Orders

## 2019-05-27 NOTE — Telephone Encounter (Signed)
Pt notified of BHCG results today which indicate a miscarriage. Pt advised not to get pregnant for 78mos. If she desires birth control she may make appt to discuss that. Also if she has not had a period in 6wks call and make an appt. Pt states she has an appt at [facility] this Friday and should she keep that appt. Advised pt to call that office in am and tell them what has happened. Pt and [facility] can discuss further plan of care. Pt voices understanding and agrees with plan. Condolences offered to pt

## 2019-11-14 DIAGNOSIS — Z419 Encounter for procedure for purposes other than remedying health state, unspecified: Secondary | ICD-10-CM | POA: Diagnosis not present

## 2019-12-15 DIAGNOSIS — Z419 Encounter for procedure for purposes other than remedying health state, unspecified: Secondary | ICD-10-CM | POA: Diagnosis not present

## 2020-01-15 DIAGNOSIS — Z419 Encounter for procedure for purposes other than remedying health state, unspecified: Secondary | ICD-10-CM | POA: Diagnosis not present

## 2020-02-14 DIAGNOSIS — Z419 Encounter for procedure for purposes other than remedying health state, unspecified: Secondary | ICD-10-CM | POA: Diagnosis not present

## 2020-02-22 DIAGNOSIS — J189 Pneumonia, unspecified organism: Secondary | ICD-10-CM | POA: Diagnosis not present

## 2020-02-22 DIAGNOSIS — N170 Acute kidney failure with tubular necrosis: Secondary | ICD-10-CM | POA: Diagnosis not present

## 2020-02-22 DIAGNOSIS — Z781 Physical restraint status: Secondary | ICD-10-CM | POA: Diagnosis not present

## 2020-02-22 DIAGNOSIS — R778 Other specified abnormalities of plasma proteins: Secondary | ICD-10-CM | POA: Diagnosis not present

## 2020-02-22 DIAGNOSIS — E875 Hyperkalemia: Secondary | ICD-10-CM | POA: Diagnosis not present

## 2020-02-22 DIAGNOSIS — N390 Urinary tract infection, site not specified: Secondary | ICD-10-CM | POA: Diagnosis not present

## 2020-02-22 DIAGNOSIS — C9000 Multiple myeloma not having achieved remission: Secondary | ICD-10-CM | POA: Diagnosis not present

## 2020-02-22 DIAGNOSIS — Z20822 Contact with and (suspected) exposure to covid-19: Secondary | ICD-10-CM | POA: Diagnosis not present

## 2020-02-22 DIAGNOSIS — D696 Thrombocytopenia, unspecified: Secondary | ICD-10-CM | POA: Diagnosis not present

## 2020-02-22 DIAGNOSIS — D509 Iron deficiency anemia, unspecified: Secondary | ICD-10-CM | POA: Diagnosis not present

## 2020-02-22 DIAGNOSIS — E1010 Type 1 diabetes mellitus with ketoacidosis without coma: Secondary | ICD-10-CM | POA: Diagnosis not present

## 2020-02-22 DIAGNOSIS — J9691 Respiratory failure, unspecified with hypoxia: Secondary | ICD-10-CM | POA: Diagnosis not present

## 2020-02-22 DIAGNOSIS — N921 Excessive and frequent menstruation with irregular cycle: Secondary | ICD-10-CM | POA: Diagnosis not present

## 2020-02-22 DIAGNOSIS — G9341 Metabolic encephalopathy: Secondary | ICD-10-CM | POA: Diagnosis not present

## 2020-02-22 DIAGNOSIS — R55 Syncope and collapse: Secondary | ICD-10-CM | POA: Diagnosis not present

## 2020-02-22 DIAGNOSIS — B9620 Unspecified Escherichia coli [E. coli] as the cause of diseases classified elsewhere: Secondary | ICD-10-CM | POA: Diagnosis not present

## 2020-02-22 DIAGNOSIS — E559 Vitamin D deficiency, unspecified: Secondary | ICD-10-CM | POA: Diagnosis not present

## 2020-02-23 DIAGNOSIS — E1110 Type 2 diabetes mellitus with ketoacidosis without coma: Secondary | ICD-10-CM

## 2020-02-23 DIAGNOSIS — E109 Type 1 diabetes mellitus without complications: Secondary | ICD-10-CM

## 2020-02-23 HISTORY — DX: Type 1 diabetes mellitus without complications: E10.9

## 2020-02-23 HISTORY — DX: Type 2 diabetes mellitus with ketoacidosis without coma: E11.10

## 2020-03-09 ENCOUNTER — Inpatient Hospital Stay (HOSPITAL_COMMUNITY): Payer: Medicaid Other | Attending: Hematology | Admitting: Hematology

## 2020-03-09 ENCOUNTER — Encounter (HOSPITAL_COMMUNITY): Payer: Self-pay | Admitting: Hematology

## 2020-03-09 ENCOUNTER — Other Ambulatory Visit: Payer: Self-pay

## 2020-03-09 VITALS — BP 140/94 | HR 114 | Temp 97.5°F | Resp 18 | Ht 63.0 in | Wt 158.2 lb

## 2020-03-09 DIAGNOSIS — D472 Monoclonal gammopathy: Secondary | ICD-10-CM

## 2020-03-09 DIAGNOSIS — E1010 Type 1 diabetes mellitus with ketoacidosis without coma: Secondary | ICD-10-CM | POA: Insufficient documentation

## 2020-03-09 DIAGNOSIS — C9000 Multiple myeloma not having achieved remission: Secondary | ICD-10-CM | POA: Diagnosis not present

## 2020-03-09 DIAGNOSIS — Z7189 Other specified counseling: Secondary | ICD-10-CM

## 2020-03-09 DIAGNOSIS — I10 Essential (primary) hypertension: Secondary | ICD-10-CM | POA: Diagnosis not present

## 2020-03-09 DIAGNOSIS — Z0001 Encounter for general adult medical examination with abnormal findings: Secondary | ICD-10-CM | POA: Insufficient documentation

## 2020-03-09 DIAGNOSIS — Z7689 Persons encountering health services in other specified circumstances: Secondary | ICD-10-CM | POA: Insufficient documentation

## 2020-03-09 NOTE — Progress Notes (Signed)
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 Vern Claude from [facility] in [place], [place].  Today she is accompanied by her fiancee. She reports feeling well since discharge from the hospital. She reports that she was diagnosed with HTN when she was 20 controlled with metoprolol, but during the recent hospitalization was informed that she had type 1 DM. She denies having any new pains.  She denies having any history of cancer or T1DM in her family, though she has family history of HTN. She used to work in a daycare center; now she is unemployed. She denies having any chemical exposure. She grew up in this area, between [place]. She had an unprovoked abortion at 10 weeks in January 2021; she denies having any other pregnancies.   MEDICAL HISTORY:  Past Medical History:  Diagnosis Date  . DKA (diabetic ketoacidosis) ([place]) 02/23/2020  . Hypertension   . Type 1 diabetes mellitus ([place]) 02/23/2020    SURGICAL HISTORY: Past Surgical History:  Procedure Laterality Date  . NO PAST SURGERIES      SOCIAL HISTORY: Social History   Socioeconomic History  . Marital status: Single    Spouse name: Not on file  . Number of children: Not on file  . Years of education: Not on file  . Highest education level: Not on file  Occupational History  . Occupation: unemployed  Tobacco Use  . Smoking status: Never Smoker  . Smokeless tobacco: Never Used  Vaping Use  . Vaping Use: Never used  Substance and Sexual Activity  . Alcohol use: Not Currently    Comment: occ  .  Drug use: No  . Sexual activity: Yes    Birth control/protection: Condom  Other Topics Concern  . Not on file  Social History Narrative   Pt is engaged   Social Determinants of Health   Financial Resource Strain: Low Risk   . Difficulty of Paying Living Expenses: Not hard at all  Food Insecurity: No Food Insecurity  . Worried About Charity fundraiser in the Last Year: Never true  . Ran Out of Food in the Last Year: Never true  Transportation Needs: No Transportation Needs  . Lack of Transportation (Medical): No  . Lack of Transportation (Non-Medical): No  Physical Activity: Insufficiently Active  . Days of Exercise per Week: 3 days  . Minutes of Exercise per Session: 10 min  Stress: No Stress Concern Present  . Feeling of Stress : Not at all  Social Connections: Unknown  . Frequency of Communication with Friends and Family: More than three times a week  . Frequency of Social Gatherings with Friends and Family: More than three times a week  . Attends Religious Services: More than 4 times per year  . Active Member of Clubs or Organizations: No  . Attends Archivist Meetings: More than 4 times per year  . Marital Status: Not on file  Intimate Partner Violence: Not At Risk  . Fear of Current or Ex-Partner: No  . Emotionally Abused: No  . Physically Abused: No  . Sexually Abused: No    FAMILY HISTORY:  Family History  Problem Relation Age of Onset  . Hypertension Mother   . Diabetes Mother   . Hypertension Father   . Diabetes Maternal Grandmother     ALLERGIES:  has No Known Allergies.  MEDICATIONS:  Current Outpatient Medications  Medication Sig Dispense Refill  . ACCU-CHEK GUIDE test strip     . Accu-Chek Softclix Lancets lancets 1 each 3 (three) times daily.    Marland Kitchen amLODipine (NORVASC) 10 MG tablet Take 10 mg by mouth daily.    Marland Kitchen ELIQUIS 5 MG TABS tablet Take by mouth at bedtime.    Marland Kitchen LANTUS SOLOSTAR 100 UNIT/ML Solostar Pen Inject 20 Units into the skin  at bedtime.    . metoprolol tartrate (LOPRESSOR) 25 MG tablet Take 25 mg by mouth daily.    . predniSONE (DELTASONE) 20 MG tablet Take 20 mg by mouth 2 (two) times daily.     No current facility-administered medications for this visit.    REVIEW OF SYSTEMS:   Review of Systems  Constitutional: Positive for appetite change (75%) and fatigue (50%).  Cardiovascular: Positive for leg swelling (feet swelling).  Musculoskeletal: Negative for arthralgias.  All other systems reviewed and are negative.    PHYSICAL EXAMINATION: ECOG PERFORMANCE STATUS: 1 - Symptomatic but completely ambulatory  Vitals:   03/09/20 1543  BP: (!) 140/94  Pulse: (!) 114  Resp: 18  Temp: (!) 97.5 F (36.4 C)  SpO2: 100%   Filed Weights   03/09/20 1543  Weight: 158 lb 3.2 oz (71.8 kg)   Physical Exam Vitals reviewed.  Constitutional:      Appearance: Normal appearance.  Cardiovascular:     Rate and Rhythm: Normal rate and regular rhythm.     Pulses: Normal pulses.     Heart sounds: Normal heart sounds.  Pulmonary:     Effort: Pulmonary effort is normal.     Breath sounds: Examination of the right-lower field reveals decreased breath sounds. Examination of the left-lower field reveals decreased breath sounds. Decreased breath sounds present.  Abdominal:     Palpations: Abdomen is soft. There is no hepatomegaly, splenomegaly or mass.     Tenderness: There is no abdominal tenderness.     Hernia: No hernia is present.  Musculoskeletal:     Right lower leg: Edema (1+) present.     Left lower leg: Edema (1+) present.  Lymphadenopathy:     Upper Body:     Right upper body: No axillary or pectoral adenopathy.     Left upper body: No axillary or pectoral adenopathy.     Lower Body: No right inguinal adenopathy. No left inguinal adenopathy.  Neurological:     General: No focal deficit present.     Mental Status: She is alert and oriented to person, place, and time.  Psychiatric:        Mood and  Affect: Mood normal.        Behavior: Behavior normal.      LABORATORY DATA:  I have reviewed the data as listed No results found for this or any previous visit (from the past 2160 hour(s)).  RADIOGRAPHIC STUDIES: I have personally reviewed the radiological images as listed and agreed with the findings in the report. No results found.  ASSESSMENT:  1.  IgA kappa multiple myeloma: -Presentation to [facility] in [place] with confusion and weakness. -Found to have diabetic ketoacidosis with newly diagnosed type 1 diabetes. -Also found to have acute renal failure. -Bone marrow biopsy on 02/25/2020 with  flow cytometry with the population CD138 positive, CD56 positive IgA kappa monoclonal plasma cells 12%.  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 -Ultrasound abdomen on 02/23/2020 showed increased hepatic echogenicity with normal spleen. -Renal ultrasound on 02/24/2020 was normal. -CT chest on 02/22/2020 with moth eaten appearance of the bones.  2.  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  Non-smoker. -No family history of myeloma or other malignancies.   PLAN:  1.  IgA kappa multiple myeloma: -I have discussed with new diagnosis of multiple myeloma. -I would request cytogenetics and FISH from bone marrow biopsy from [facility]. -If they were not done, we need to repeat the biopsy. -Recommended PET CT scan ASAP. -We will also check blood work including baseline SPEP, free light chains, immunofixation, CBC, CMP, 24-hour urine for UPEP, urine immunofixation and light chains.  We will check LDH and beta-2 microglobulin. -Talk to her about 4 drug regimen based on [place] study with daratumumab, Velcade, Revlimid and dexamethasone. -We also talked about this regimen given every 21 days for 4  cycles followed by autologous stem cell transplant. -We will need to start her on acyclovir 400 mg twice daily. -Her medication list shows Eliquis 5 mg at bedtime.  Not clear why she is on it.  We will obtain records.  2.  Type I diabetes: -Newly diagnosed during the most recent admission. -She is currently on Lantus 20 units at bedtime. -We will get a baseline hemoglobin A1c.  We need to seek help from endocrinology once we start her on dexamethasone regimen for myeloma.  3.  Hypertension: -Continue Norvasc 10 mg daily.  Continue metoprolol 25 mg daily.   All questions were answered. The patient knows to call the clinic with any problems, questions or concerns.  [name], MD 03/09/20 4:34 PM  [place] [phone]   I, Milinda Antis, am acting as a scribe for Dr. Sanda Linger.  I, [name] MD, have reviewed the above documentation for accuracy and completeness, and I agree with the above.

## 2020-03-09 NOTE — Progress Notes (Signed)
START ON PATHWAY REGIMEN - Multiple Myeloma and Other Plasma Cell Dyscrasias   DaraVRd (Daratumumab SUBQ + Bortezomib SUBQ + Lenalidomide PO + Dexamethasone IV/PO) q21 Days (Induction Schema):   A cycle is every 21 days:     Lenalidomide      Dexamethasone      Bortezomib      Daratumumab and hyaluronidase-fihj   **Always confirm dose/schedule in your pharmacy ordering system**  DaraVRd (Daratumumab SUBQ + Bortezomib SUBQ + Lenalidomide PO + Dexamethasone IV/PO) q21 Days (Consolidation Schema):   A cycle is every 21 days:     Lenalidomide      Dexamethasone      Bortezomib      Daratumumab and hyaluronidase-fihj   **Always confirm dose/schedule in your pharmacy ordering system**  Patient Characteristics: Multiple Myeloma, Newly Diagnosed, Transplant Eligible, High Risk Disease Classification: Multiple Myeloma R-ISS Staging: Unknown Therapeutic Status: Newly Diagnosed Is Patient Eligible for Transplant<= Transplant Eligible Risk Status: High Risk Intent of Therapy: Non-Curative / Palliative Intent, Discussed with Patient

## 2020-03-09 NOTE — Patient Instructions (Addendum)
[place] at [facility] Discharge Instructions  You were seen and examined today by Dr. Delton Coombes. Dr. Delton Coombes is a medical oncologist, meaning he specializes in the management of cancer. Dr. Delton Coombes discussed your past medical history, family history of cancer and the events that led to you being here today.  Dr. Delton Coombes has recommended a PET scan, which is a specialized CT scan, that illuminates cancer present in your body. Dr. Delton Coombes has also recommended additional blood work.  We will call the hospital to see if a FISH test was ordered on the bone marrow biopsy. The FISH test will show Dr. Delton Coombes the "big picture." The FISH test will help drive recommendations on long term treatment following the bone marrow transplant because it tells Dr. Delton Coombes how you multiple myeloma behaves.  Generally, multiple myeloma is a very treatable type of cancer. Multiple myeloma is not curable. Dr. Delton Coombes has recommended chemotherapy followed by a bone marrow transplant. Chemotherapy consists of pills and shots. Shots are done here in the clinic. A bone marrow transplant is done at [facility] or [facility] typically.   Thank you for choosing [place] at [facility] to provide your oncology and hematology care.  To afford each patient quality time with our provider, please arrive at least 15 minutes before your scheduled appointment time.   If you have a lab appointment with the [place] please come in thru the Main Entrance and check in at the main information desk.  You need to re-schedule your appointment should you arrive 10 or more minutes late.  We strive to give you quality time with our providers, and arriving late affects you and other patients whose appointments are after yours.  Also, if you no show three or more times for appointments you may be dismissed from the clinic at the providers discretion.     Again, thank you for  choosing [facility].  Our hope is that these requests will decrease the amount of time that you wait before being seen by our physicians.       _____________________________________________________________  Should you have questions after your visit to [facility], please contact our office at [phone] and follow the prompts.  Our office hours are 8:00 a.m. and 4:30 p.m. Monday - Friday.  Please note that voicemails left after 4:00 p.m. may not be returned until the following business day.  We are closed weekends and major holidays.  You do have access to a nurse 24-7, just call the main number to the clinic [phone] and do not press any options, hold on the line and a nurse will answer the phone.    For prescription refill requests, have your pharmacy contact our office and allow 72 hours.    Due to Covid, you will need to wear a mask upon entering the hospital. If you do not have a mask, a mask will be given to you at the Main Entrance upon arrival. For doctor visits, patients may have 1 support person age 26 or older with them. For treatment visits, patients can not have anyone with them due to social distancing guidelines and our immunocompromised population.

## 2020-03-11 ENCOUNTER — Inpatient Hospital Stay (HOSPITAL_COMMUNITY): Payer: Medicaid Other

## 2020-03-11 ENCOUNTER — Other Ambulatory Visit: Payer: Self-pay

## 2020-03-11 ENCOUNTER — Other Ambulatory Visit (HOSPITAL_COMMUNITY): Payer: Self-pay

## 2020-03-11 DIAGNOSIS — E1010 Type 1 diabetes mellitus with ketoacidosis without coma: Secondary | ICD-10-CM | POA: Diagnosis not present

## 2020-03-11 DIAGNOSIS — D472 Monoclonal gammopathy: Secondary | ICD-10-CM

## 2020-03-11 DIAGNOSIS — C9000 Multiple myeloma not having achieved remission: Secondary | ICD-10-CM | POA: Diagnosis not present

## 2020-03-11 DIAGNOSIS — I10 Essential (primary) hypertension: Secondary | ICD-10-CM | POA: Diagnosis not present

## 2020-03-11 LAB — COMPREHENSIVE METABOLIC PANEL
ALT: 25 U/L (ref 0–44)
AST: 16 U/L (ref 15–41)
Albumin: 3.7 g/dL (ref 3.5–5.0)
Alkaline Phosphatase: 87 U/L (ref 38–126)
Anion gap: 12 (ref 5–15)
BUN: 10 mg/dL (ref 6–20)
CO2: 20 mmol/L — ABNORMAL LOW (ref 22–32)
Calcium: 8 mg/dL — ABNORMAL LOW (ref 8.9–10.3)
Chloride: 102 mmol/L (ref 98–111)
Creatinine, Ser: 0.66 mg/dL (ref 0.44–1.00)
GFR, Estimated: >60 mL/min (ref >60)
Glucose, Bld: 353 mg/dL — ABNORMAL HIGH (ref 70–99)
Potassium: 4.4 mmol/L (ref 3.5–5.1)
Sodium: 134 mmol/L — ABNORMAL LOW (ref 135–145)
Total Bilirubin: 0.8 mg/dL (ref 0.3–1.2)
Total Protein: 8 g/dL (ref 6.5–8.1)

## 2020-03-11 LAB — LACTATE DEHYDROGENASE: LDH: 322 U/L — ABNORMAL HIGH (ref 98–192)

## 2020-03-11 LAB — CBC WITH DIFFERENTIAL/PLATELET
Abs Immature Granulocytes: 0.23 10*3/uL — ABNORMAL HIGH (ref 0.00–0.07)
Basophils Absolute: 0.1 10*3/uL (ref 0.0–0.1)
Basophils Relative: 1 %
Eosinophils Absolute: 0 10*3/uL (ref 0.0–0.5)
Eosinophils Relative: 0 %
HCT: 36.2 % (ref 36.0–46.0)
Hemoglobin: 11.7 g/dL — ABNORMAL LOW (ref 12.0–15.0)
Immature Granulocytes: 2 %
Lymphocytes Relative: 13 %
Lymphs Abs: 1.7 10*3/uL (ref 0.7–4.0)
MCH: 28.7 pg (ref 26.0–34.0)
MCHC: 32.3 g/dL (ref 30.0–36.0)
MCV: 88.9 fL (ref 80.0–100.0)
Monocytes Absolute: 0.6 10*3/uL (ref 0.1–1.0)
Monocytes Relative: 5 %
Neutro Abs: 10.2 10*3/uL — ABNORMAL HIGH (ref 1.7–7.7)
Neutrophils Relative %: 79 %
Platelets: 199 10*3/uL (ref 150–400)
RBC: 4.07 MIL/uL (ref 3.87–5.11)
RDW: 17.7 % — ABNORMAL HIGH (ref 11.5–15.5)
WBC: 12.8 10*3/uL — ABNORMAL HIGH (ref 4.0–10.5)
nRBC: 0.4 % — ABNORMAL HIGH (ref 0.0–0.2)

## 2020-03-12 DIAGNOSIS — C9000 Multiple myeloma not having achieved remission: Secondary | ICD-10-CM | POA: Diagnosis not present

## 2020-03-12 DIAGNOSIS — E1110 Type 2 diabetes mellitus with ketoacidosis without coma: Secondary | ICD-10-CM | POA: Diagnosis not present

## 2020-03-12 DIAGNOSIS — N289 Disorder of kidney and ureter, unspecified: Secondary | ICD-10-CM | POA: Diagnosis not present

## 2020-03-12 DIAGNOSIS — R000 Tachycardia, unspecified: Secondary | ICD-10-CM | POA: Diagnosis not present

## 2020-03-12 LAB — BETA 2 MICROGLOBULIN, SERUM: Beta-2 Microglobulin: 2.1 mg/L (ref 0.6–2.4)

## 2020-03-12 LAB — HEMOGLOBIN A1C
Hgb A1c MFr Bld: 8.8 % — ABNORMAL HIGH (ref 4.8–5.6)
Mean Plasma Glucose: 206 mg/dL

## 2020-03-13 LAB — UPEP/UIFE/LIGHT CHAINS/TP, 24-HR UR
% BETA, Urine: 29.8 %
ALPHA 1 URINE: 3.2 %
Albumin, U: 30.6 %
Alpha 2, Urine: 11.4 %
Free Kappa Lt Chains,Ur: 76.29 mg/L (ref 0.63–113.79)
Free Kappa/Lambda Ratio: 3.52 (ref 1.03–31.76)
Free Lambda Lt Chains,Ur: 21.67 mg/L — ABNORMAL HIGH (ref 0.47–11.77)
GAMMA GLOBULIN URINE: 25 %
Total Protein, Urine-Ur/day: 251 mg/24 hr — ABNORMAL HIGH (ref 30–150)
Total Protein, Urine: 17.3 mg/dL
Total Volume: 1450

## 2020-03-13 NOTE — Progress Notes (Signed)
Per Dr. Delton Coombes for first dose of chemotherapy give pre-medications of diphenhydramine 50 mg and dexamethasone 20 mg as intravenous medications.  Diphenhydramine 50 mg IV push and Dexamethasone 20 mg IVPB added to cycle 1 day 1.  T.O. Dr Rhys Martini, PharmD

## 2020-03-14 LAB — IMMUNOFIXATION ELECTROPHORESIS
IgA: 105 mg/dL (ref 87–352)
IgG (Immunoglobin G), Serum: 1366 mg/dL (ref 586–1602)
IgM (Immunoglobulin M), Srm: 60 mg/dL (ref 26–217)
Total Protein ELP: 7.2 g/dL (ref 6.0–8.5)

## 2020-03-16 ENCOUNTER — Other Ambulatory Visit (HOSPITAL_COMMUNITY): Payer: Self-pay

## 2020-03-16 ENCOUNTER — Inpatient Hospital Stay (HOSPITAL_COMMUNITY): Payer: Medicaid Other | Attending: Hematology

## 2020-03-16 ENCOUNTER — Encounter (HOSPITAL_COMMUNITY): Payer: Self-pay

## 2020-03-16 ENCOUNTER — Other Ambulatory Visit: Payer: Self-pay

## 2020-03-16 DIAGNOSIS — Z794 Long term (current) use of insulin: Secondary | ICD-10-CM | POA: Diagnosis not present

## 2020-03-16 DIAGNOSIS — E876 Hypokalemia: Secondary | ICD-10-CM | POA: Diagnosis not present

## 2020-03-16 DIAGNOSIS — E109 Type 1 diabetes mellitus without complications: Secondary | ICD-10-CM | POA: Diagnosis not present

## 2020-03-16 DIAGNOSIS — Z5112 Encounter for antineoplastic immunotherapy: Secondary | ICD-10-CM | POA: Insufficient documentation

## 2020-03-16 DIAGNOSIS — C9000 Multiple myeloma not having achieved remission: Secondary | ICD-10-CM

## 2020-03-16 DIAGNOSIS — I10 Essential (primary) hypertension: Secondary | ICD-10-CM | POA: Diagnosis not present

## 2020-03-16 DIAGNOSIS — Z419 Encounter for procedure for purposes other than remedying health state, unspecified: Secondary | ICD-10-CM | POA: Diagnosis not present

## 2020-03-16 LAB — PREGNANCY, URINE: Preg Test, Ur: NEGATIVE

## 2020-03-16 MED ORDER — LENALIDOMIDE 20 MG PO CAPS: 20.0000 mg | ORAL_CAPSULE | Freq: Every day | ORAL | 0 refills | Status: DC

## 2020-03-16 NOTE — Progress Notes (Signed)
Revlimid prescription faxed to [facility]. [employer] Auth # [id number] obtained on 03/16/2020.

## 2020-03-16 NOTE — Progress Notes (Signed)
Urine pregnancy order placed prior to Revlimid prescription per Dr. Delton Coombes

## 2020-03-17 LAB — KAPPA/LAMBDA LIGHT CHAINS
Kappa free light chain: 36.8 mg/L — ABNORMAL HIGH (ref 3.3–19.4)
Kappa, lambda light chain ratio: 2.85 — ABNORMAL HIGH (ref 0.26–1.65)
Lambda free light chains: 12.9 mg/L (ref 5.7–26.3)

## 2020-03-18 ENCOUNTER — Encounter (HOSPITAL_COMMUNITY): Payer: Self-pay

## 2020-03-18 LAB — PROTEIN ELECTROPHORESIS, SERUM
A/G Ratio: 0.9 (ref 0.7–1.7)
Albumin ELP: 3.6 g/dL (ref 2.9–4.4)
Alpha-1-Globulin: 0.4 g/dL (ref 0.0–0.4)
Alpha-2-Globulin: 1 g/dL (ref 0.4–1.0)
Beta Globulin: 1.2 g/dL (ref 0.7–1.3)
Gamma Globulin: 1.2 g/dL (ref 0.4–1.8)
Globulin, Total: 3.8 g/dL (ref 2.2–3.9)
Total Protein ELP: 7.4 g/dL (ref 6.0–8.5)

## 2020-03-18 NOTE — Progress Notes (Signed)
Revlimid prescription clarified with [employer]. 2 weeks on, 1 week of for a 21 day cycle per Dr. Delton Coombes.

## 2020-03-23 ENCOUNTER — Other Ambulatory Visit: Payer: Self-pay

## 2020-03-23 ENCOUNTER — Encounter (HOSPITAL_COMMUNITY)
Admission: RE | Admit: 2020-03-23 | Discharge: 2020-03-23 | Disposition: A | Discharge status: Home / Self Care | Payer: Medicaid Other | Source: Ambulatory Visit | Attending: Hematology | Admitting: Hematology

## 2020-03-23 DIAGNOSIS — D72829 Elevated white blood cell count, unspecified: Secondary | ICD-10-CM | POA: Diagnosis not present

## 2020-03-23 DIAGNOSIS — C9000 Multiple myeloma not having achieved remission: Secondary | ICD-10-CM | POA: Diagnosis not present

## 2020-03-23 DIAGNOSIS — D472 Monoclonal gammopathy: Secondary | ICD-10-CM

## 2020-03-23 DIAGNOSIS — R000 Tachycardia, unspecified: Secondary | ICD-10-CM | POA: Diagnosis not present

## 2020-03-23 DIAGNOSIS — E109 Type 1 diabetes mellitus without complications: Secondary | ICD-10-CM | POA: Diagnosis not present

## 2020-03-23 DIAGNOSIS — R8290 Unspecified abnormal findings in urine: Secondary | ICD-10-CM | POA: Diagnosis not present

## 2020-03-23 MED ORDER — FLUDEOXYGLUCOSE F - 18 (FDG) INJECTION
8.3200 | Freq: Once | INTRAVENOUS | Status: AC | PRN
  Administered 2020-03-23: 8.32 via INTRAVENOUS

## 2020-03-25 ENCOUNTER — Inpatient Hospital Stay (HOSPITAL_COMMUNITY): Payer: Medicaid Other

## 2020-03-25 ENCOUNTER — Encounter (HOSPITAL_COMMUNITY): Payer: Self-pay

## 2020-03-25 ENCOUNTER — Other Ambulatory Visit: Payer: Self-pay

## 2020-03-25 ENCOUNTER — Other Ambulatory Visit (HOSPITAL_COMMUNITY): Payer: Self-pay

## 2020-03-25 ENCOUNTER — Inpatient Hospital Stay (HOSPITAL_BASED_OUTPATIENT_CLINIC_OR_DEPARTMENT_OTHER): Payer: Medicaid Other | Admitting: Hematology

## 2020-03-25 VITALS — BP 130/90 | HR 137 | Temp 98.1°F | Resp 16 | Wt 150.0 lb

## 2020-03-25 DIAGNOSIS — I10 Essential (primary) hypertension: Secondary | ICD-10-CM | POA: Diagnosis not present

## 2020-03-25 DIAGNOSIS — E876 Hypokalemia: Secondary | ICD-10-CM | POA: Diagnosis not present

## 2020-03-25 DIAGNOSIS — C9000 Multiple myeloma not having achieved remission: Secondary | ICD-10-CM

## 2020-03-25 DIAGNOSIS — Z5112 Encounter for antineoplastic immunotherapy: Secondary | ICD-10-CM | POA: Diagnosis not present

## 2020-03-25 DIAGNOSIS — E109 Type 1 diabetes mellitus without complications: Secondary | ICD-10-CM | POA: Diagnosis not present

## 2020-03-25 DIAGNOSIS — Z794 Long term (current) use of insulin: Secondary | ICD-10-CM | POA: Diagnosis not present

## 2020-03-25 LAB — COMPREHENSIVE METABOLIC PANEL
ALT: 17 U/L (ref 0–44)
AST: 13 U/L — ABNORMAL LOW (ref 15–41)
Albumin: 3.6 g/dL (ref 3.5–5.0)
Alkaline Phosphatase: 95 U/L (ref 38–126)
Anion gap: 10 (ref 5–15)
BUN: 11 mg/dL (ref 6–20)
CO2: 20 mmol/L — ABNORMAL LOW (ref 22–32)
Calcium: 9 mg/dL (ref 8.9–10.3)
Chloride: 104 mmol/L (ref 98–111)
Creatinine, Ser: 0.69 mg/dL (ref 0.44–1.00)
GFR, Estimated: >60 mL/min (ref >60)
Glucose, Bld: 294 mg/dL — ABNORMAL HIGH (ref 70–99)
Potassium: 3.8 mmol/L (ref 3.5–5.1)
Sodium: 134 mmol/L — ABNORMAL LOW (ref 135–145)
Total Bilirubin: 0.5 mg/dL (ref 0.3–1.2)
Total Protein: 7.6 g/dL (ref 6.5–8.1)

## 2020-03-25 LAB — CBC WITH DIFFERENTIAL/PLATELET
Abs Immature Granulocytes: 0.03 10*3/uL (ref 0.00–0.07)
Basophils Absolute: 0 10*3/uL (ref 0.0–0.1)
Basophils Relative: 1 %
Eosinophils Absolute: 0.3 10*3/uL (ref 0.0–0.5)
Eosinophils Relative: 4 %
HCT: 32.8 % — ABNORMAL LOW (ref 36.0–46.0)
Hemoglobin: 10.5 g/dL — ABNORMAL LOW (ref 12.0–15.0)
Immature Granulocytes: 0 %
Lymphocytes Relative: 19 %
Lymphs Abs: 1.3 10*3/uL (ref 0.7–4.0)
MCH: 29.5 pg (ref 26.0–34.0)
MCHC: 32 g/dL (ref 30.0–36.0)
MCV: 92.1 fL (ref 80.0–100.0)
Monocytes Absolute: 0.6 10*3/uL (ref 0.1–1.0)
Monocytes Relative: 8 %
Neutro Abs: 4.8 10*3/uL (ref 1.7–7.7)
Neutrophils Relative %: 68 %
Platelets: 317 10*3/uL (ref 150–400)
RBC: 3.56 MIL/uL — ABNORMAL LOW (ref 3.87–5.11)
RDW: 17.7 % — ABNORMAL HIGH (ref 11.5–15.5)
WBC: 7 10*3/uL (ref 4.0–10.5)
nRBC: 0 % (ref 0.0–0.2)

## 2020-03-25 LAB — LACTATE DEHYDROGENASE: LDH: 143 U/L (ref 98–192)

## 2020-03-25 LAB — PREGNANCY, URINE: Preg Test, Ur: NEGATIVE

## 2020-03-25 MED ORDER — LENALIDOMIDE 20 MG PO CAPS
20.0000 mg | ORAL_CAPSULE | Freq: Every day | ORAL | 0 refills | Status: DC
Start: 2020-03-25 — End: 2020-03-25

## 2020-03-25 MED ORDER — ASPIRIN EC 81 MG PO TBEC: 81.0000 mg | DELAYED_RELEASE_TABLET | Freq: Every day | ORAL | 11 refills | Status: DC

## 2020-03-25 MED ORDER — LENALIDOMIDE 20 MG PO CAPS
20.0000 mg | ORAL_CAPSULE | Freq: Every day | ORAL | 0 refills | Status: DC
Start: 2020-03-25 — End: 2020-04-14

## 2020-03-25 MED ORDER — ACYCLOVIR 400 MG PO TABS: 400.0000 mg | ORAL_TABLET | Freq: Two times a day (BID) | ORAL | 6 refills | Status: DC

## 2020-03-25 NOTE — Progress Notes (Signed)
I have faxed prescription and negative urine pregnancy test to [employer]. Call placed to patient's fiance, Randall Hiss. I informed Randall Hiss that [facility] does not have a payment plan. Randall Hiss tells me that he is contacting foundations that may assist with financial expenses of fertility preservation and call the clinic if the patient's treatment will be delayed. I also spoke with the patient about transportation concerns. I provided the number to [facility] [phone], per Webb Silversmith (LCSW) they can arrange transport through her insurance. All questions were addressed and answered. Randall Hiss to call the clinic with updates regarding fertility preservation.

## 2020-03-25 NOTE — Progress Notes (Signed)
[name], [name]   CLINIC:  Medical Oncology/Hematology  PCP:  Patient, No Pcp Per None  None  REASON FOR VISIT:  Follow-up for IgA kappa multiple myeloma  PRIOR THERAPY: None  CURRENT THERAPY: RVD to begin  INTERVAL HISTORY:  [name], a 29 y.o. female, returns for routine follow-up for her multiple myeloma. [name] was last seen on 03/09/2020.  Today she is accompanied by her fiancee and she reports feeling okay. She already submitted her urine sample for the pregnancy test. She has finished her bottle of Eliquis. She is taking 25 units of Lantus at bedtime; she continues taking metoprolol and amlodipine.   REVIEW OF SYSTEMS:  Review of Systems  Constitutional: Positive for appetite change (75%) and fatigue (75%).  All other systems reviewed and are negative.   PAST MEDICAL/SURGICAL HISTORY:  Past Medical History:  Diagnosis Date  . DKA (diabetic ketoacidosis) ([place]) 02/23/2020  . Hypertension   . Type 1 diabetes mellitus ([place]) 02/23/2020   Past Surgical History:  Procedure Laterality Date  . NO PAST SURGERIES      SOCIAL HISTORY:  Social History   Socioeconomic History  . Marital status: Single    Spouse name: Not on file  . Number of children: Not on file  . Years of education: Not on file  . Highest education level: Not on file  Occupational History  . Occupation: unemployed  Tobacco Use  . Smoking status: Never Smoker  . Smokeless tobacco: Never Used  Vaping Use  . Vaping Use: Never used  Substance and Sexual Activity  . Alcohol use: Not Currently    Comment: occ  . Drug use: No  . Sexual activity: Yes    Birth control/protection: Condom  Other Topics Concern  . Not on file  Social History Narrative   Pt is engaged   Social Determinants of Health   Financial Resource Strain: Low Risk   . Difficulty of Paying Living Expenses: Not hard at all  Food Insecurity: No Food Insecurity    . Worried About Charity fundraiser in the Last Year: Never true  . Ran Out of Food in the Last Year: Never true  Transportation Needs: No Transportation Needs  . Lack of Transportation (Medical): No  . Lack of Transportation (Non-Medical): No  Physical Activity: Insufficiently Active  . Days of Exercise per Week: 3 days  . Minutes of Exercise per Session: 10 min  Stress: No Stress Concern Present  . Feeling of Stress : Not at all  Social Connections: Unknown  . Frequency of Communication with Friends and Family: More than three times a week  . Frequency of Social Gatherings with Friends and Family: More than three times a week  . Attends Religious Services: More than 4 times per year  . Active Member of Clubs or Organizations: No  . Attends Archivist Meetings: More than 4 times per year  . Marital Status: Not on file  Intimate Partner Violence: Not At Risk  . Fear of Current or Ex-Partner: No  . Emotionally Abused: No  . Physically Abused: No  . Sexually Abused: No    FAMILY HISTORY:  Family History  Problem Relation Age of Onset  . Hypertension Mother   . Diabetes Mother   . Hypertension Father   . Diabetes Maternal Grandmother     CURRENT MEDICATIONS:  Current Outpatient Medications  Medication Sig Dispense Refill  . ACCU-CHEK GUIDE test strip     .  Accu-Chek Softclix Lancets lancets 1 each 3 (three) times daily.    Marland Kitchen amLODipine (NORVASC) 10 MG tablet Take 10 mg by mouth daily.    . Blood Glucose Monitoring Suppl (ACCU-CHEK GUIDE) w/Device KIT     . ELIQUIS 5 MG TABS tablet Take by mouth at bedtime.    Marland Kitchen LANTUS SOLOSTAR 100 UNIT/ML Solostar Pen Inject 20 Units into the skin at bedtime.    Marland Kitchen lenalidomide (REVLIMID) 20 MG capsule Take 1 capsule (20 mg total) by mouth daily. 14 capsule 0  . metoprolol tartrate (LOPRESSOR) 25 MG tablet Take 25 mg by mouth daily.    Marland Kitchen acyclovir (ZOVIRAX) 400 MG tablet Take 1 tablet (400 mg total) by mouth 2 (two) times daily. 60  tablet 6   No current facility-administered medications for this visit.    ALLERGIES:  No Known Allergies  PHYSICAL EXAM:  Performance status (ECOG): 1 - Symptomatic but completely ambulatory  Vitals:   03/25/20 1040  BP: 130/90  Pulse: (!) 137  Resp: 16  Temp: 98.1 F (36.7 C)  SpO2: 100%   Wt Readings from Last 3 Encounters:  03/25/20 150 lb (68 kg)  03/09/20 158 lb 3.2 oz (71.8 kg)  05/24/19 185 lb (83.9 kg)   Physical Exam Vitals reviewed.  Constitutional:      Appearance: Normal appearance.  Neurological:     General: No focal deficit present.     Mental Status: She is alert and oriented to person, place, and time.  Psychiatric:        Mood and Affect: Mood normal.        Behavior: Behavior normal.     LABORATORY DATA:  I have reviewed the labs as listed.  CBC Latest Ref Rng & Units 03/11/2020 05/24/2019  WBC 4.0 - 10.5 K/uL 12.8(H) 8.7  Hemoglobin 12.0 - 15.0 g/dL 11.7(L) 10.7(L)  Hematocrit 36 - 46 % 36.2 32.7(L)  Platelets 150 - 400 K/uL 199 291   CMP Latest Ref Rng & Units 03/11/2020  Glucose 70 - 99 mg/dL 353(H)  BUN 6 - 20 mg/dL 10  Creatinine 0.44 - 1.00 mg/dL 0.66  Sodium 135 - 145 mmol/L 134(L)  Potassium 3.5 - 5.1 mmol/L 4.4  Chloride 98 - 111 mmol/L 102  CO2 22 - 32 mmol/L 20(L)  Calcium 8.9 - 10.3 mg/dL 8.0(L)  Total Protein 6.5 - 8.1 g/dL 8.0  Total Bilirubin 0.3 - 1.2 mg/dL 0.8  Alkaline Phos 38 - 126 U/L 87  AST 15 - 41 U/L 16  ALT 0 - 44 U/L 25      Component Value Date/Time   RBC 4.07 03/11/2020 1411   MCV 88.9 03/11/2020 1411   MCH 28.7 03/11/2020 1411   MCHC 32.3 03/11/2020 1411   RDW 17.7 (H) 03/11/2020 1411   LYMPHSABS 1.7 03/11/2020 1411   MONOABS 0.6 03/11/2020 1411   EOSABS 0.0 03/11/2020 1411   BASOSABS 0.1 03/11/2020 1411   Lab Results  Component Value Date   LDH 322 (H) 03/11/2020   Lab Results  Component Value Date   TOTALPROTELP 7.4 03/16/2020   ALBUMINELP 3.6 03/16/2020   A1GS 0.4 03/16/2020   A2GS 1.0  03/16/2020   BETS 1.2 03/16/2020   GAMS 1.2 03/16/2020   MSPIKE Not Observed 03/16/2020   SPEI Comment 03/16/2020    Lab Results  Component Value Date   KPAFRELGTCHN 36.8 (H) 03/16/2020   LAMBDASER 12.9 03/16/2020   KAPLAMBRATIO 2.85 (H) 03/16/2020    DIAGNOSTIC IMAGING:  I have independently  reviewed the scans and discussed with the patient.   ASSESSMENT:  1.  IgA kappa multiple myeloma: -Presentation to [facility] in [place] with confusion and weakness. -Found to have diabetic ketoacidosis with newly diagnosed type 1 diabetes. -Also found to have acute renal failure and hypercalcemia. -Bone marrow biopsy on 02/25/2020 with flow cytometry with the population CD138 positive, CD56 positive IgA kappa monoclonal plasma cells 12%.  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 -Ultrasound abdomen on 02/23/2020 showed increased hepatic echogenicity with normal spleen. -Renal ultrasound on 02/24/2020 was normal. -CT chest on 02/22/2020 with moth eaten appearance of the bones. -Beta-2 microglobulin 2.1.  LDH 322.  SPEP and immunofixation were normal.  Kappa light chains are elevated at 36.8.  Lambda light chains 12.9 and ratio of 2.85. -24-hour urine immunofixation was negative.  M spike was negative. -PET scan on 03/23/2020 showed no FDG radiotracer activity, no soft tissue plasmacytoma.  Diffuse multiple small lytic lesions within the pelvis, spine and vertebral bodies consistent with myeloma. -Chromosome analysis was 36, XX.  Multiple myeloma FISH panel was normal.  2.  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  Non-smoker. -No family history of myeloma or other malignancies.   PLAN:  1.  Stage III IgA kappa multiple myeloma: -We talked about the PET scan and blood work in detail. -We have also talked about  fertility preservation.  They would be interested in looking into it. -We will obtain Revlimid for her. -We have talked about 4 drug regimen with daratumumab, bortezomib, dexamethasone and Revlimid. -We discussed side effects in detail. -She is on Eliquis which was started for DVT prophylaxis in the hospital.  We have told her to discontinue Eliquis. -She was told to take aspirin 81 mg daily. -We have sent a prescription for acyclovir twice daily. -We'll likely start her treatment as soon as her fertility preservation is done.  2.  Type I diabetes: -Her hemoglobin A1c is 8.8. -She is currently on Lantus 25 units at bedtime.  3.  Hypertension: -Continue Norvasc 10 mg daily.  Continue metoprolol 25 mg daily.  Orders placed this encounter:  No orders of the defined types were placed in this encounter.    [name], MD [place] [phone]   I, Milinda Antis, am acting as a scribe for Dr. Sanda Linger.  I, [name] MD, have reviewed the above documentation for accuracy and completeness, and I agree with the above.

## 2020-03-25 NOTE — Patient Instructions (Signed)
[place] at [facility] Discharge Instructions  You were seen today by Dr. Delton Coombes. He went over your recent results. You will be started on Velcade injections several times per week, along with Revlimid and Decadron to take on a schedule; if you receive the Revlimid, hold it until you start treatment on Monday, November 15th. You will be prescribed acyclovir to take daily. Purchase aspirin 81 mg over the counter and take 1 tablet daily. You will be referred to a transplant center, either in [place] or [facility]. Dr. Delton Coombes will see you back on November 29th for labs and follow up.   Thank you for choosing [place] at [facility] to provide your oncology and hematology care.  To afford each patient quality time with our provider, please arrive at least 15 minutes before your scheduled appointment time.   If you have a lab appointment with the [place] please come in thru the Main Entrance and check in at the main information desk  You need to re-schedule your appointment should you arrive 10 or more minutes late.  We strive to give you quality time with our providers, and arriving late affects you and other patients whose appointments are after yours.  Also, if you no show three or more times for appointments you may be dismissed from the clinic at the providers discretion.     Again, thank you for choosing [facility].  Our hope is that these requests will decrease the amount of time that you wait before being seen by our physicians.       _____________________________________________________________  Should you have questions after your visit to [facility], please contact our office at [phone] between the hours of 8:00 a.m. and 4:30 p.m.  Voicemails left after 4:00 p.m. will not be returned until the following business day.  For prescription refill requests, have your pharmacy contact our  office and allow 72 hours.    Cancer Center Support Programs:   > Cancer Support Group  2nd Tuesday of the month 1pm-2pm, Journey Room

## 2020-03-26 LAB — KAPPA/LAMBDA LIGHT CHAINS
Kappa free light chain: 47.6 mg/L — ABNORMAL HIGH (ref 3.3–19.4)
Kappa, lambda light chain ratio: 2.74 — ABNORMAL HIGH (ref 0.26–1.65)
Lambda free light chains: 17.4 mg/L (ref 5.7–26.3)

## 2020-03-27 ENCOUNTER — Inpatient Hospital Stay: Payer: Medicaid Other | Admitting: Family Medicine

## 2020-03-28 NOTE — Patient Instructions (Addendum)
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  You will be treated in the clinic twice weekly for the first eight cycles of treatment with two drugs - bortezomib (Velcade) and daratumumab (Darzalex).  You will also take a chemo pill (Revlimid) at home per the directions given to you by Dr. Delton Coombes.  The intent of treatment is to control your disease and to alleviate any symptoms you may be having related to this disease. You will see the doctor regularly throughout treatment.  We will obtain blood work from you prior to every treatment and monitor your results to make sure it is safe to give your treatment. The doctor monitors your response to treatment by the way you are feeling, your blood work, and by obtaining scans periodically. There will be wait times while you are here for treatment.  It will take about 30 minutes to 1 hour for your lab work to result.  Then there will be wait times while pharmacy mixes your medications.    Daratumumab (Darzalex Faspro)  About This Drug Daratumumab is used to treat cancer. It is given under the skin in your abdomen (subcutaneously).  Possible Side Effects  . You may have a reaction to the drug. Sometimes you may be given medication to stop or lessen these side effects. Your nurse will check you closely for these signs: fever or shaking chills, flushing, facial swelling, feeling dizzy, headache, trouble breathing, rash, itching, chest tightness, or chest pain. These reactions may happen after your infusion. If this happens, call 911 for emergency care.  . Decrease in the number of white blood cells and platelets. This may raise your risk of infection, and raise your risk of bleeding.  . Fever and chills  . Tiredness  . Feeling dizzy  . Trouble sleeping  . Cough and trouble breathing  . Upper respiratory infection  . Nausea and throwing up (vomiting)  . Loose bowel movements  (diarrhea)  . Constipation (not able to move bowels)  . Muscle spasms  . Pain in the joints  . Back pain  . Swelling of your legs, ankles and/or feet  . Effects on the nerves are called peripheral neuropathy. You may feel numbness, tingling, or pain in your hands and feet. It may be hard for you to button your clothes, open jars, or walk as usual. The effect on the nerves may get worse with more doses of the drug. These effects get better in some people after the drug is stopped but it does not get better in all people.  Note: Each of the side effects above was reported in 20% or greater of patients treated with daratumumab. Not all possible side effects are included above.  Warnings and Precautions  . Severe decrease in the number of white blood cells and platelets    Severe allergic reaction  . This medication can affect the results of blood tests that match your blood type. Your blood type will be tested before treatment. Be sure to tell all healthcare providers you are taking this medicine before receiving blood transfusions, even for 6 months after your last dose.  Important Information  . This drug may be present in the saliva, tears, sweat, urine, stool, vomit, semen, and vaginal secretions. Talk to your doctor and/or your nurse about the necessary precautions to take during this time.  Treating Side Effects  . Drink plenty of fluids (a minimum of eight glasses per day is  recommended).  . If you throw up or have loose bowel movements, you should drink more fluids so that you do not become dehydrated (lack of water in the body from losing too much fluid).  . To help with nausea and vomiting, eat small, frequent meals instead of three large meals a day. Choose foods and drinks that are at room temperature. Ask your nurse or doctor about other helpful tips and medicine that is available to help stop or lessen these symptoms.  . If you have diarrhea, eat low-fiber foods that  are high in protein and calories and avoid foods that can irritate your digestive tracts or lead to cramping.  . Ask your nurse or doctor about medicine that can lessen or stop your diarrhea or constipation.  . If you are not able to move your bowels, check with your doctor or nurse before you use enemas, laxatives, or suppositories.  . Manage tiredness by pacing your activities for the day.  . Be sure to include periods of rest between energy-draining activities.  . To decrease the risk of infection, wash your hands regularly.  . Avoid close contact with people who have a cold, the flu, or other infections.  . Take your temperature as your doctor or nurse tells you, and whenever you feel like you may have a fever.  . To help decrease the risk of bleeding, use a soft toothbrush. Check with your nurse before using dental floss.  . Be very careful when using knives or tools.  . Use an electric shaver instead of a razor.  [place] [employer] Keeping your pain under control is important to your well-being. Please tell your doctor or nurse if you are experiencing pain.  . If you are dizzy, get up slowly after sitting or lying.  . If you are having trouble sleeping, talk to your nurse or doctor on tips to help you sleep better.  . If you have numbness and tingling in your hands and feet, be careful when cooking, walking, and handling sharp objects and hot liquids.  . Infusion reactions may rarely occur after your infusion. If this happens, call 911 for emergency care.  Food and Drug Interactions  . There are no known interactions of daratumumab with food.  . This drug may interact with other medicines. Tell your doctor and pharmacist about all the prescription and over-the-counter medicines and dietary supplements (vitamins, minerals, herbs and others) that you are taking at this time. Also, check with your doctor or pharmacist before starting any new prescription or over-the-counter medicines, or  dietary supplements to make sure that there are no interactions.  When to Call the Doctor  Call your doctor or nurse if you have any of these symptoms and/or any new or unusual symptoms:  . Fever of 100.4 F (38 C) or higher  . Chills  . Pain in your chest  . Coughing up yellow, green, or bloody mucus.  . Wheezing or trouble breathing  . Tiredness that interferes with your daily activities  . Trouble falling or staying asleep  . Feeling dizzy or lightheaded  . Easy bleeding or bruising  . Nausea that stops you from eating or drinking and/or is not relieved by prescribed medicines  . Vomiting  . No bowel movement in 3 days or when you feel uncomfortable.  . Loose bowel movements (diarrhea) 4 times a day or loose bowel movements with lack of strength or a feeling of being dizzy  . Weight gain of 5 pounds  in one week (fluid retention)  . Swelling of your legs, ankles and/or feet  . Pain that does not go away, or is not relieved by prescribed medicines  . Signs of infusion reaction: fever or shaking chills, flushing, facial swelling, feeling dizzy, headache, trouble breathing, rash, itching, chest tightness, or chest pain. If this happens, call 911 for emergency care.  . Numbness, tingling, or pain in your hands and feet  . If you think you may be pregnant or may have impregnated your partner  Reproduction Warnings  . Pregnancy warning: This drug may have harmful effects on the unborn baby. Women of childbearing potential should use effective methods of birth control during your cancer treatment and for at least 3 months after treatment. Let your doctor know right away if you think you may be pregnant.  . Breastfeeding warning: It is not known if this drug passes into breast milk. For this reason, women should talk to their doctor about the risks and benefits of breastfeeding during treatment with this drug because this drug may enter the breast milk and cause harm to a  breastfeeding baby.  . Fertility warning: Human fertility studies have not been done with this drug. Talk with your doctor or nurse if you plan to have children. Ask for information on sperm or egg banking.   Bortezomib (Velcade)  About This Drug  Bortezomib is used to treat cancer. It is given in the vein (IV) or by a shot under the skin (subcutaneously).  You will receive this injection under your skin.  Possible Side Effects  . Bone marrow suppression. Decrease in the number of white blood cells, red blood cells, and platelets. This may raise your risk of infection, make you tired and weak (fatigue), and raise your risk of bleeding.  . Nausea and vomiting (throwing up)  . Constipation (not able to move bowels)  . Diarrhea (loose bowel movements)  . Fever  . Tiredness  . Decreased appetite (decreased hunger)  . Effects on the nerves are called peripheral neuropathy. You may feel numbness, tingling, or pain in your hands and feet. It may be hard for you to button your clothes, open jars, or walk as usual. The effect on the nerves may get worse with more doses of the drug. These effects get better in some people after the drug is stopped but it does not get better in all people.  . Rash  Note: Each of the side effects above was reported in 20% or greater of patients treated with bortezomib. Not all possible side effects are included above.  Warnings and Precautions  . Severe peripheral neuropathy  . Low blood pressure  . Congestive heart failure - your heart has less ability to pump blood properly.  . Trouble breathing because of fluid build-up and/or inflammation in your lungs  . Nausea, vomiting, diarrhea and constipation which sometimes requires treatment to help lessen these side effects. There is also an increased risk of developing a partial or complete blockage of your small and/or large intestine.  . Changes in your central nervous system can happen. The central  nervous system is made up of your brain and spinal cord. You could feel extreme tiredness, agitation, confusion, have hallucinations (see or hear things that are not there), trouble understanding or speaking, loss of control of your bowels or bladder, eyesight changes, numbness or lack of strength to your arms, legs, face, or body, seizures or coma. If you start to have any of these symptoms  let your doctor know right away.  . Tumor lysis syndrome: This drug may act on the cancer cells very quickly. This may affect how your kidneys work.  . Changes in your liver function Increased risk of a syndrome that affects your red blood cells, platelets and blood vessels in your kidneys, which can cause kidney failure and be life-threatening.  Important Information  . This drug may be present in the saliva, tears, sweat, urine, stool, vomit, semen, and vaginal secretions. Talk to your doctor and/or your nurse about the necessary precautions to take during this time.  . This drug may impair your ability to drive or use machinery. Use caution and tell your nurse or doctor if you feel dizzy, very sleepy, and/or experience low blood pressure.  Treating Side Effects  . Manage tiredness by pacing your activities for the day.  . Be sure to include periods of rest between energy-draining activities.  . To decrease the risk of infection, wash your hands regularly.  . Avoid close contact with people who have a cold, the flu, or other infections.  . Take your temperature as your doctor or nurse tells you, and whenever you feel like you may have a fever.  . To help decrease the risk of bleeding, use a soft toothbrush. Check with your nurse before using dental floss.  . Be very careful when using knives or tools.  . Use an electric shaver instead of a razor.  . Ask your doctor or nurse about medicines that are available to help stop or lessen constipation.  . If you are not able to move your bowels,  check with your doctor or nurse before you use enemas, laxatives, or suppositories.  . Drink plenty of fluids (a minimum of eight glasses per day is recommended).  . If you throw up or have loose bowel movements, you should drink more fluids so that you do not become dehydrated (lack of water in the body from losing too much fluid).  . If you have diarrhea, eat low-fiber foods that are high in protein and calories and avoid foods that can irritate your digestive tracts or lead to cramping.  . Ask your nurse or doctor about medicine that can lessen or stop your diarrhea.  . To help with nausea and vomiting, eat small, frequent meals instead of three large meals a day. Choose foods and drinks that are at room temperature. Ask your nurse or doctor about other helpful tips and medicine that is available to help stop or lessen these symptoms.  . To help with decreased appetite, eat foods high in calories and protein, such as meat, poultry, fish, dry beans, tofu, eggs, nuts, milk, yogurt, cheese, ice cream, pudding, and nutritional supplements.  . Consider using sauces and spices to increase taste. Daily exercise, with your doctor's approval, may increase your appetite.  . If you have numbness and tingling in your hands and feet, be careful when cooking, walking, and handling sharp objects and hot liquids.  . If you get a rash do not put anything on it unless your doctor or nurse says you may. Keep the area around the rash clean and dry. Ask your doctor for medicine if your rash bothers you.  Food and Drug Interactions  . This drug may interact with grapefruit and grapefruit juice. Talk to your doctor as this could make side effects worse.  . Check with your doctor or pharmacist about all other prescription medicines and over-the-counter medicines and dietary supplements (vitamins,  minerals, herbs and others) you are taking before starting this medicine as there are known drug interactions with  bortezomib. Also, check with your doctor or pharmacist before starting any new prescription or over-the-counter medicines, or dietary supplements to make sure that there are no interactions.  . Avoid the use of St. John's Wort with bortezomib as this may lower the levels of the drug in your body, which can make it less effective.  When to Call the Doctor  Call your doctor or nurse if you have any of these symptoms and/or any new or unusual symptoms:  . Fever of 100.4 F (38 C) or higher  . Chills  . Tiredness that interferes with your daily activities  . Feeling dizzy or lightheaded  . Feeling that your heart is beating in a fast or not normal way (palpitations)  . Cough  . Wheezing or trouble breathing  . Easy bleeding or bruising  . Confusion and/or agitation  . Hallucinations  . Trouble understanding or speaking  . Blurry vision or changes in your eyesight  . Numbness or lack of strength to your arms, legs, face, or body  . Symptoms of a seizure such as confusion, blacking out, passing out, loss of hearing or vision, blurred vision, unusual smells or tastes (such as burning rubber), trouble talking, tremors or shaking in parts or all of the body, repeated body movements, tense muscles that do not relax, and loss of control of urine and bowels. If you or your family member suspects you are having a seizure, call 911 right away.  . Nausea that stops you from eating or drinking and/or is not relieved by prescribed medicines  . Throwing up   . Lasting loss of appetite or rapid weight loss of five pounds in a week  . No bowel movement in 3 days or when you feel uncomfortable.  . Abdominal pain that does not go away  . Diarrhea, 4 times in one day or diarrhea with lack of strength or a feeling of being dizzy  . Numbness, tingling, or pain your hands and feet  . Swelling of legs, ankles, and/or feet  . Weight gain of 5 pounds in one week (fluid retention)  .  Decreased urine, or very dark urine  . New rash and/or itching  . Rash that is not relieved by prescribed medicines  . Signs of tumor lysis: Confusion or agitation, decreased urine, nausea/vomiting, diarrhea, muscle cramping, numbness and/or tingling, seizures.  . Signs of possible liver problems: dark urine, pale bowel movements, bad stomach pain, feeling very tired and weak, unusual itching, or yellowing of the eyes or skin  . If you think you are pregnant or may have impregnated your partner  Reproduction Warnings  . Pregnancy warning: This drug can have harmful effects on the unborn baby. Women of child bearing potential should use effective methods of birth control during your cancer treatment and for at least 7 months after treatment. Men with female partners of childbearing potential should use effective methods of birth control during your cancer treatment and for at least 4 months after your cancer treatment. Let your doctor know right away if you think you may be pregnant or may have impregnated your partner.  . Breastfeeding warning: Women should not breastfeed during treatment and for 2 months month after treatment because this drug could enter the breast milk and cause harm to a breastfeeding baby.  . Fertility warning: In men and women both, this drug may affect your  ability to have children in the future. Talk with your doctor or nurse if you plan to have children. Ask for information on sperm or egg banking.   Lenalidomide (Revlimid)  About This Drug Lenalidomide is used to treat cancer. It is given orally (by mouth).  Possible Side Effects . Bone marrow suppression. This is a decrease in the number of white blood cells, red blood cells, and platelets. This may raise your risk of infection, make you tired and weak (fatigue), and raise your risk of bleeding.  . Nausea  . Diarrhea (loose bowel movements)  . Constipation (unable to move bowels)  . Inflammation of your  stomach and/or intestines  . Pain in your abdomen or back pain  . Fever  . Tiredness and weakness  . Swelling of your legs, ankles and/or feet  . Decreased appetite (decreased hunger)  . Muscle cramps/spasms  . Pain in your joints  . Headache  . Feeling dizzy  . Tremor  . Trouble sleeping  . Nosebleed  . Upper respiratory infection, bronchitis  . Inflammation of the nasal passages and throat  . Trouble breathing  . Cough  . Rash and itching  Note: Each of the side effects above was reported in 15% or greater of patients treated with lenalidomide. Not all possible side effects are included above.  Warnings and Precautions . Blood clots and events such as stroke and heart attack. A blood clot in your leg may cause your leg to swell, appear red and warm, and/or cause pain. A blood clot in your lungs may cause trouble breathing, pain when breathing, and/or chest pain.  . Severe bone marrow suppression  . Changes in your liver function, which may cause liver failure and be life-threatening.  . Tumor lysis syndrome: This drug may act on the cancer cells very quickly. This may affect how your kidneys work and can be life-threatening.  . Changes in your thyroid function  . Severe allergic skin reaction which may be life-threatening. You may develop blisters on your skin that are filled with fluid or a severe red rash all over your body that may be painful.  . This drug may raise your risk of getting a second cancer.  . You may develop a syndrome called tumor flare reaction. You may have painful lymph nodes, enlarged spleen, fever, and a rash.  . This drug may make it more difficult to collect your stem cells if a stem cell transplant is part of your treatment plan.  . There is a rare increased risk of death in patients with chronic lymphocytic leukemia and a risk of early death (dying sooner) in patient with mantle cell lymphoma.  . Allergic reactions, including  anaphylaxis are rare but may happen in some patients. Signs of allergic reaction to this drug may be swelling of the face, feeling like your tongue or throat are swelling, trouble breathing, rash, itching, fever, chills, feeling dizzy, and/or feeling that your heart is beating in a fast or not normal way. If this happens, do not take another dose of this drug. You should get urgent medical treatment.  Note: Some of the side effects above are very rare. If you have concerns and/or questions, please discuss them with your medical team.  Important Information . You will need to sign up for a special program called Revlimid REMS when you start taking this drug. Your nurse will help you get started.  . Two negative pregnancy tests are required in women of childbearing potential  prior to starting treatment. Routine pregnancy tests are required during treatment.  . Do not donate blood during your treatment and for 4 weeks after your treatment.  . Men should not donate sperm during your treatment and for 4 weeks after your treatment because this drug is present in semen and may badly harm a baby.   How to Take Your Medication . Swallow the medicine whole with water, with or without food. Do not chew, break, or open it.  . Take this medicine at about the same time each day  . Missed dose: If you miss a dose, take it as soon as you think about it ONLY if it has been less than 12 hours since you normally take the missed dose. If it has been more than 12 hours, skip the missed dose and contact your physician. Take your next dose at the regular time. Do not take 2 doses at the same time and do not double up on the next dose.  . If you vomit a dose, take your next dose at the regular time.  . Handling: Wash your hands after handling your medicine, your caretakers should not handle your medicine with bare hands and should wear latex gloves.  . If you get any of the content of a broken capsules on your  skin, you should wash the area of the skin well with soap and water right away. Call your doctor if you get a skin reaction.  . This drug may be present in the saliva, tears, sweat, urine, stool, vomit, semen, and vaginal secretions. Talk to your doctor and/or your nurse about the necessary precautions to take during this time.  . Storage: Store this medicine in the original container at room temperature.  . Disposal of unused medicine: Do not flush any expired and/or unused medicine down the toilet or drain unless you are specifically instructed to do so on the medication label. Some facilities have take-back programs and/or other options. If you do not have a take-back program in your area, then please discuss with your nurse or your doctor how to dispose of unused medicine.  Treating Side Effects . Manage tiredness by pacing your activities for the day.  . Be sure to include periods of rest between energy-draining activities.  . If you are dizzy, get up slowly after sitting or lying.  . To decrease the risk of infection, wash your hands regularly.  . Avoid close contact with people who have a cold, the flu, or other infections.  . Take your temperature as your doctor or nurse tells you, and whenever you feel like you may have a fever.  . To help decrease the risk of bleeding, use a soft toothbrush. Check with your nurse before using dental floss.  . Be very careful when using knives or tools.  . Use an electric shaver instead of a razor.  . Ask your doctor or nurse about medicines that are available to help stop or lessen constipation and/or diarrhea.  . If you are not able to move your bowels, check with your doctor or nurse before you use enemas, laxatives, or suppositories.  . Drink plenty of fluids (a minimum of eight glasses per day is recommended).  . Drink fluids that contribute calories (whole milk, juice, soft drinks, sweetened beverages, milkshakes, and nutritional  supplements) instead of water.  . If you throw up or have loose bowel movements, you should drink more fluids so that you do not become dehydrated (lack of  water in the body from losing too much fluid).  . If you have diarrhea, eat low-fiber foods that are high in protein and calories and avoid foods that can irritate your digestive tracts or lead to cramping.  . To help with nausea and vomiting, eat small, frequent meals instead of three large meals a day. Choose foods and drinks that are at room temperature. Ask your nurse or doctor about other helpful tips and medicine that is available to help stop or lessen these symptoms.  . To help with decreased appetite, eat small, frequent meals. Eat foods high in calories and protein, such as meat, poultry, fish, dry beans, tofu, eggs, nuts, milk, yogurt, cheese, ice cream, pudding, and nutritional supplements.  . Consider using sauces and spices to increase taste. Daily exercise, with your doctor's approval, may increase your appetite.  . If you get a rash, do not put anything on it unless your doctor or nurse says you may. Keep the area around the rash clean and dry. Ask your doctor for medicine if your rash bothers you.  Marland Kitchen Keeping your pain under control is important to your well-being. Please tell your doctor or nurse if you are experiencing pain.  . If you are having trouble sleeping, talk to your nurse or doctor on tips to help you sleep better.  . If you have a nosebleed, sit with your head tipped slightly forward. Apply pressure by lightly pinching the bridge of your nose between your thumb and forefinger. Call your doctor if you feel dizzy or faint or if the bleeding does not stop after 10 to 15 minutes.  . Moisturize your skin several times a day.  . Avoid sun exposure and apply sunscreen routinely when outdoors.  Food and Drug Interactions . There are no known interactions of lenalidomide with food.  . Check with your doctor or  pharmacist about all other prescription medicines and over-the-counter medicines and dietary supplements (vitamins, minerals, herbs, and others) you are taking before starting this medicine as there are known drug interactions with lenalidomide. Also, check with your doctor or pharmacist before starting any new prescription or over-the-counter medicines, or dietary supplements to make sure that there are no interactions.  . There are known interactions of lenalidomide with blood-thinning medicine such as warfarin. Ask your doctor what precautions you should take.  When to Call the Doctor Call your doctor or nurse if you have any of these symptoms and/or any new or unusual symptoms: . Fever of 100.4 F (38 C) or higher . Chills . Tiredness that interferes with your daily activities . Feeling dizzy or lightheaded . Easy bleeding or bruising . Your leg or arm is swollen, red, warm, and/or painful . Headache that does not go away . Nosebleed that does not stop bleeding after 10-15 minutes . Painful lymph nodes . Wheezing and/or trouble breathing . Chest pain or symptoms of a heart attack. Most heart attacks involve pain in the center of the chest that lasts more than a few minutes. The pain may go away and come back. It can feel like pressure, squeezing, fullness, or pain. Sometimes pain is felt in one or both arms, the back, neck, jaw, or stomach. If any of these symptoms last 2 minutes, call 911. Marland Kitchen Symptoms of a stroke such as sudden numbness or weakness of your face, arm, or leg, mostly on one side of your body; sudden confusion, trouble speaking or understanding; sudden trouble seeing in one or both eyes; sudden trouble  walking, feeling dizzy, loss of balance or coordination; or sudden, bad headache with no known cause. If you have any of these symptoms for 2 minutes, call 911. . Signs of allergic reaction: swelling of the face, feeling like your tongue or throat are swelling, trouble breathing,  rash, itching, fever, chills, feeling dizzy, and/or feeling that your heart is beating in a fast or not normal way. If this happens, call 911 for emergency care. . Coughing up yellow, green, or bloody mucus . Feeling that your heart is beating in a fast or not normal way (palpitations) . Nausea that stops you from eating or drinking and/or is not relieved by prescribed medicines . Throwing up more than 3 times a day . Loose bowel movements (diarrhea) 4 times a day or loose bowel movements with lack of strength or a feeling of being dizzy . No bowel movement in 3 days or when you feel uncomfortable . Trouble falling or staying asleep . Pain in your abdomen that does not go away . Weight gain of 5 pounds in one week (fluid retention) . Swelling of your legs, ankles and/or feet . Unexplained weight gain . Lasting loss of appetite or rapid weight loss of five pounds in a week . Pain that does not go away, or is not relieved by prescribed medicines . Flu-like symptoms: fever, headache, muscle and joint aches, and fatigue (low energy, feeling weak) . A new rash or itching that is not relieved by prescribed medicines . Signs of possible liver problems: dark urine, pale bowel movements, bad stomach pain, feeling very tired and weak, unusual itching, or yellowing of the eyes or skin . Signs of tumor lysis: confusion or agitation, decreased urine, nausea/vomiting, diarrhea, muscle cramping, numbness and/or tingling, seizures . If you think you may be pregnant or may have impregnated your partner  Reproduction Warnings . Pregnancy warning: This drug can have harmful effects on the unborn baby. Women of childbearing potential must commit to abstain from heterosexual intercourse or use 2 effective methods of birth control, one of which, must be a highly effective method of birth control, beginning at least 4 weeks before treatment starts, during your cancer treatment, including dose interruptions, and for at  least 4 weeks after treatment. A highly effective method of birth control includes tubal ligation, intrauterine device (IUD), hormonal (birth control pills, injections, patch and/or implants) or a partner's vasectomy. Stop taking lenalidomide immediately and let your doctor know right away if you think you may be pregnant, miss your menstrual period, or experience unusual menstrual bleeding. . Men with female partners of childbearing potential should use effective methods of birth control during your cancer treatment and for at least 4 weeks after your cancer treatment.   . Breastfeeding warning: Women should not breastfeed during treatment because this drug could enter the breast milk and cause harm to a breastfeeding baby.  . Fertility warning: Human fertility studies have not been done with this drug. Talk with your doctor or nurse if you plan to have children. Ask for information on sperm or egg banking.  Dexamethasone (Decadron)  About This Drug  Dexamethasone is used to treat cancer, to decrease inflammation and sometimes used before and after chemotherapy to prevent or treat nausea and/or vomiting. It is given in the vein (IV) or orally (by mouth).  Possible Side Effects  . Headache  . High blood pressure  . Abnormal heart beat  . Tiredness and weakness  . Changes in mood, which may include depression  or a feeling of extreme well-being  . Trouble sleeping  . Increased sweating  . Increased appetite (increased hunger)  . Weight gain  . Increase risk of infections  . Pain in your abdomen  . Nausea  . Skin changes such as rash, dryness, redness  . Blood sugar levels may change  . Electrolyte changes  . Swelling of your legs, ankles and/or feet  . Changes in your liver function  . You may be at risk for cataracts, glaucoma or infections of the eye  . Muscle loss and / or weakness (lack of muscle strength)  . Increased risk of developing osteoporosis- your  bones may become weak and brittle  Note: Not all possible side effects are included above.  Warnings and Precautions  . This drug may cause you to feel irritable, nervous or restless.  . Allergic reactions, including anaphylaxis are rare but may happen in some patients. Signs of allergic reaction to this drug may be swelling of the face, feeling like your tongue or throat are swelling, trouble breathing, rash, itching, fever, chills, feeling dizzy, and/or feeling that your heart is beating in a fast or not normal way. If this happens, do not take another dose of this drug. You should get urgent medical treatment.  . High blood pressure and changes in electrolytes, which can cause fluid build-up around your heart, lungs or elsewhere.  . Increased risk of developing a hole in your stomach, small, and/or large intestine if you have ulcers in the lining of your stomach and/or intestine, or have diverticulitis, ulcerative colitis and/or other diseases that affect the gastrointestinal tract.  . Effects on the endocrine glands including the pituitary, adrenals or thyroid during or after use of this medication.  . Changes in the tissue of the heart, that can cause your heart to have less ability to pump blood. You may be short of breath or our arms, hands, legs and feet may swell.  . Increased risk of heart attack.  . Severe depression and other psychiatric disorders such as mood changes.  . Burning, pain and itching around your anus may happen when this drug is given in the vein too rapidly (IV). It usually happens suddenly and resolves in less than 1 minute.  Important Information  . Talk to your doctor or your nurse before stopping this medication, it should be stopped gradually. Depending on the dose and length of treatment, you could experience serious side effects if stopped abruptly (suddenly).  . Talk to your doctor before receiving any vaccinations during your treatment. Some  vaccinations are not recommended while receiving dexamethasone.  How to Take Your Medication  . For Oral (by mouth): You can take the medicine with or without food. If you have nausea or upset stomach, take it with food.  . Missed dose: If you miss a dose, do not take 2 doses at the same time or extra doses. . If you vomit a dose, take your next dose at the regular time. Do not take 2 doses at the same time  . Handling: Wash your hands after handling your medicine, your caretakers should not handle your medicine with bare hands and should wear latex gloves.  . Storage: Store this medicine in the original container at room temperature. Protect from moisture and light. Discuss with your nurse or your doctor how to dispose of unused medicine.   Treating Side Effects  . Drink plenty of fluids (a minimum of eight glasses per day is recommended).  Marland Kitchen  To help with nausea and vomiting, eat small, frequent meals instead of three large meals a day. Choose foods and drinks that are at room temperature. Ask your nurse or doctor about other helpful tips and medicine that is available to help stop or lessen these symptoms.  . If you throw up, you should drink more fluids so that you do not become dehydrated (lack of water in the body from losing too much fluid).  . Manage tiredness by pacing your activities for the day.  . Be sure to include periods of rest between energy-draining activities.  . To help with muscle weakness, get regular exercise. If you feel too tired to exercise vigorously, try taking a short walk.  . If you are having trouble sleeping, talk to your nurse or doctor on tips to help you sleep better.  . If you are feeling depressed, talk to your nurse or doctor about it.  Marland Kitchen Keeping your pain under control is important to your well-being. Please tell your doctor or nurse if you are experiencing pain.  . If you have diabetes, keep good control of your blood sugar level. Tell your  nurse or your doctor if your glucose levels are higher or lower than normal.  . To decrease the risk of infection, wash your hands regularly.  . Avoid close contact with people who have a cold, the flu, or other infections.  . Take your temperature as your doctor or nurse tells you, and whenever you feel like you may have a fever.  . If you get a rash do not put anything on it unless your doctor or nurse says you may. Keep the area around the rash clean and dry. Ask your doctor for medicine if your rash bothers you.  . Moisturize your skin several times day.  . Avoid sun exposure and apply sunscreen routinely when outdoors.  Food and Drug Interactions  . There are no known interactions of dexamethasone with food.  . Check with your doctor or pharmacist about all other prescription medicines and over-the-counter medicines and dietary supplements (vitamins, minerals, herbs and others) you are taking before starting this medicine as there are known drug interactions with dexamethasone. Also, check with your doctor or pharmacist before starting any new prescription or over-the-counter medicines, or dietary supplement to make sure that there are no interactions.  . There are known interactions of dexamethasone with other medicines and products like acetaminophen, aspirin, and ibuprofen. Ask your doctor what over-the-counter (OTC) medicines you can take.  When to Call the Doctor  Call your doctor or nurse if you have any of these symptoms and/or any new or unusual symptoms:  . Fever of 100.4 F (38 C) or higher  . Chills  . A headache that does not go away  . Trouble breathing  . Blurry vision or other changes in eyesight  . Feel irritable, nervous or restless  . Trouble falling or staying asleep  . Severe mood changes such as depression or unusual thoughts and/or behaviors  . Thoughts of hurting yourself or others, and suicide  . Tiredness that interferes with your daily  activities  . Feeling that your heart is beating in a fast, slow or not normal way  . Feeling dizzy or lightheaded  . Chest pain or symptoms of a heart attack. Most heart attacks involve pain in the center of the chest that lasts more than a few minutes. The pain may go away and come back, or it can be constant.  It can feel like pressure, squeezing, fullness, or pain. Sometimes pain is felt in one or both arms, the back, neck, jaw, or stomach. If any of these symptoms last 2 minutes, call 911.  Marland Kitchen Heartburn or indigestion  . Nausea that stops you from eating or drinking and/or is not relieved by prescribed medicines  . Throwing up more than 3 times a day  . Pain in your abdomen that does not go away  . Abnormal blood sugar  . Unusual thirst, passing urine often, headache, sweating, shakiness, irritability  . Swelling of legs, ankles, or feet  . Weight gain of 5 pounds in one week (fluid retention)  . Signs of possible liver problems: dark urine, pale bowel movements, bad stomach pain, feeling very tired and weak, unusual itching, or yellowing of the eyes or skin  . Severe muscle weakness  . A new rash or a rash that is not relieved by prescribed medicines  . Signs of allergic reaction: swelling of the face, feeling like your tongue or throat are swelling, trouble breathing, rash, itching, fever, chills, feeling dizzy, and/or feeling that your heart is beating in a fast or not normal way. If this happens, call 911 for emergency care.  . If you think you may be pregnant  Reproduction Warnings  . Pregnancy warning: It is not known if this drug may harm an unborn child. For this reason, be sure to talk with your doctor if you are pregnant or planning to become pregnant while receiving this drug. Let your doctor know right away if you think you may be pregnant or may have impregnated your partner.  . Breastfeeding warning: It is not known if this drug passes into breast milk. For this  reason, women should talk to their doctor about the risks and benefits of breastfeeding during treatment with this drug because this drug may enter the breast milk and cause harm to a breastfeeding baby.  . Fertility warning: Human fertility studies have not been done with this drug. Talk with your doctor or nurse if you plan to have children. Ask for information on sperm banking.    SELF CARE ACTIVITIES WHILE ON IMMUNOTHERAPY:  Hydration Increase your fluid intake 48 hours prior to treatment and drink at least 8 to 12 cups (64 ounces) of water/decaffeinated beverages per day after treatment. You can still have your cup of coffee or soda but these beverages do not count as part of your 8 to 12 cups that you need to drink daily. No alcohol intake.  Medications Continue taking your normal prescription medication as prescribed.  If you start any new herbal or new supplements please let us know first to make sure it is safe.  Mouth Care Have teeth cleaned professionally before starting treatment. Keep dentures and partial plates clean. Use soft toothbrush and do not use mouthwashes that contain alcohol. Biotene is a good mouthwash that is available at most pharmacies or may be ordered by calling [phone]. Use warm salt water gargles (1 teaspoon salt per 1 quart warm water) before and after meals and at bedtime. Or you may rinse with 2 tablespoons of three-percent hydrogen peroxide mixed in eight ounces of water. If you are still having problems with your mouth or sores in your mouth please call the clinic. If you need dental work, please let the doctor know before you go for your appointment so that we can coordinate the best possible time for you in regards to your chemo regimen. You  need to also let your dentist know that you are actively taking chemo. We may need to do labs prior to your dental appointment.  Skin Care Always use sunscreen that has not expired and with SPF (Sun Protection  Factor) of 50 or higher. Wear hats to protect your head from the sun. Remember to use sunscreen on your hands, ears, face, & feet.  Use good moisturizing lotions such as udder cream, eucerin, or even Vaseline. Some chemotherapies can cause dry skin, color changes in your skin and nails.    . Avoid long, hot showers or baths. . Use gentle, fragrance-free soaps and laundry detergent. . Use moisturizers, preferably creams or ointments rather than lotions because the thicker consistency is better at preventing skin dehydration. Apply the cream or ointment within 15 minutes of showering. Reapply moisturizer at night, and moisturize your hands every time after you wash them.   Infection Prevention Please wash your hands for at least 30 seconds using warm soapy water. Handwashing is the #1 way to prevent the spread of germs. Stay away from sick people or people who are getting over a cold. If you develop respiratory systems such as green/yellow mucus production or productive cough or persistent cough let us know and we will see if you need an antibiotic. It is a good idea to keep a pair of gloves on when going into grocery stores/[employer] to decrease your risk of coming into contact with germs on the carts, etc. Carry alcohol hand gel with you at all times and use it frequently if out in public. If your temperature reaches 100.5 or higher please call the clinic and let us know.  If it is after hours or on the weekend please go to the ER if your temperature is over 100.4.  Please have your own personal thermometer at home to use.    Sex and bodily fluids If you are going to have sex, a condom must be used to protect the person that isn't taking immunotherapy. For a few days after treatment, immunotherapy can be excreted through your bodily fluids.  When using the toilet please close the lid and flush the toilet twice.  Do this for a few day after you have had immunotherapy.   Contraception It is not known for  sure whether or not immunotherapy drugs can be passed on through semen or secretions from the vagina. Because of this some doctors advise people to use a barrier method if you have sex during treatment. This applies to vaginal, anal or oral sex.  Generally, doctors advise a barrier method only for the time you are actually having the treatment and for about a week after your treatment.  Advice like this can be worrying, but this does not mean that you have to avoid being intimate with your partner. You can still have close contact with your partner and continue to enjoy sex.  Animals If you have cats or birds we just ask that you not change the litter or change the cage.  Please have someone else do this for you while you are on immunotherapy.   Food Safety During and After Cancer Treatment Food safety is important for people both during and after cancer treatment. Cancer and cancer treatments, such as chemotherapy, radiation therapy, and stem cell/bone marrow transplantation, often weaken the immune system. This makes it harder for your body to protect itself from foodborne illness, also called food poisoning. Foodborne illness is caused by eating food that contains harmful bacteria,  parasites, or viruses.  Foods to avoid Some foods have a higher risk of becoming tainted with bacteria. These include: Marland Kitchen Unwashed fresh fruit and vegetables, especially leafy vegetables that can hide dirt and other contaminants . Raw sprouts, such as alfalfa sprouts . Raw or undercooked beef, especially ground beef, or other raw or undercooked meat and poultry . Fatty, fried, or spicy foods immediately before or after treatment.  These can sit heavy on your stomach and make you feel nauseous. . Raw or undercooked shellfish, such as oysters. . Sushi and sashimi, which often contain raw fish.  . Unpasteurized beverages, such as unpasteurized fruit juices, raw milk, raw yogurt, or cider . Undercooked eggs, such as  soft boiled, over easy, and poached; raw, unpasteurized eggs; or foods made with raw egg, such as homemade raw cookie dough and homemade mayonnaise  Simple steps for food safety  Shop smart. . Do not buy food stored or displayed in an unclean area. . Do not buy bruised or damaged fruits or vegetables. . Do not buy cans that have cracks, dents, or bulges. . Pick up foods that can spoil at the end of your shopping trip and store them in a cooler on the way home.  Prepare and clean up foods carefully. . Rinse all fresh fruits and vegetables under running water, and dry them with a clean towel or paper towel. . Clean the top of cans before opening them. . After preparing food, wash your hands for 20 seconds with hot water and soap. Pay special attention to areas between fingers and under nails. . Clean your utensils and dishes with hot water and soap. Marland Kitchen Disinfect your kitchen and cutting boards using 1 teaspoon of liquid, unscented bleach mixed into 1 quart of water.    Dispose of old food. . Eat canned and packaged food before its expiration date (the "use by" or "best before" date). . Consume refrigerated leftovers within 3 to 4 days. After that time, throw out the food. Even if the food does not smell or look spoiled, it still may be unsafe. Some bacteria, such as Listeria, can grow even on foods stored in the refrigerator if they are kept for too long.  Take precautions when eating out. . At restaurants, avoid buffets and salad bars where food sits out for a long time and comes in contact with many people. Food can become contaminated when someone with a virus, often a norovirus, or another "bug" handles it. . Put any leftover food in a "to-go" container yourself, rather than having the server do it. And, refrigerate leftovers as soon as you get home. . Choose restaurants that are clean and that are willing to prepare your food as you order it cooked.    SYMPTOMS TO REPORT AS SOON AS  POSSIBLE AFTER TREATMENT:   FEVER GREATER THAN 100.4 F  CHILLS WITH OR WITHOUT FEVER  NAUSEA AND VOMITING THAT IS NOT CONTROLLED WITH YOUR NAUSEA MEDICATION  UNUSUAL SHORTNESS OF BREATH  UNUSUAL BRUISING OR BLEEDING  TENDERNESS IN MOUTH AND THROAT WITH OR WITHOUT PRESENCE OF ULCERS  URINARY PROBLEMS  BOWEL PROBLEMS  UNUSUAL RASH     Wear comfortable clothing and clothing appropriate for easy access to any Portacath or PICC line. Let us know if there is anything that we can do to make your therapy better!   What to do if you need assistance after hours or on the weekends: CALL [phone].  HOLD on the line, do not hang up.  You will hear multiple messages but at the end you will be connected with a nurse triage line.  They will contact the doctor if necessary.  Most of the time they will be able to assist you.  Do not call the hospital operator.    I have been informed and understand all of the instructions given to me and have received a copy. I have been instructed to call the clinic [phone] or my family physician as soon as possible for continued medical care, if indicated. I do not have any more questions at this time but understand that I may call the [facility] or the Patient Navigator at [phone] during office hours should I have questions or need assistance in obtaining follow-up care.

## 2020-03-30 ENCOUNTER — Inpatient Hospital Stay (HOSPITAL_COMMUNITY): Payer: Medicaid Other

## 2020-03-30 ENCOUNTER — Ambulatory Visit (HOSPITAL_COMMUNITY): Payer: Medicaid Other

## 2020-03-30 DIAGNOSIS — C9000 Multiple myeloma not having achieved remission: Secondary | ICD-10-CM

## 2020-03-30 LAB — PROTEIN ELECTROPHORESIS, SERUM
A/G Ratio: 0.8 (ref 0.7–1.7)
Albumin ELP: 3.1 g/dL (ref 2.9–4.4)
Alpha-1-Globulin: 0.4 g/dL (ref 0.0–0.4)
Alpha-2-Globulin: 1.1 g/dL — ABNORMAL HIGH (ref 0.4–1.0)
Beta Globulin: 1.2 g/dL (ref 0.7–1.3)
Gamma Globulin: 1.1 g/dL (ref 0.4–1.8)
Globulin, Total: 3.9 g/dL (ref 2.2–3.9)
Total Protein ELP: 7 g/dL (ref 6.0–8.5)

## 2020-03-31 ENCOUNTER — Inpatient Hospital Stay (HOSPITAL_COMMUNITY): Payer: Medicaid Other

## 2020-03-31 VITALS — BP 100/70 | HR 101 | Temp 97.9°F | Resp 18 | Wt 149.0 lb

## 2020-03-31 DIAGNOSIS — I10 Essential (primary) hypertension: Secondary | ICD-10-CM | POA: Diagnosis not present

## 2020-03-31 DIAGNOSIS — E109 Type 1 diabetes mellitus without complications: Secondary | ICD-10-CM | POA: Diagnosis not present

## 2020-03-31 DIAGNOSIS — Z5112 Encounter for antineoplastic immunotherapy: Secondary | ICD-10-CM | POA: Diagnosis not present

## 2020-03-31 DIAGNOSIS — E876 Hypokalemia: Secondary | ICD-10-CM | POA: Diagnosis not present

## 2020-03-31 DIAGNOSIS — Z794 Long term (current) use of insulin: Secondary | ICD-10-CM | POA: Diagnosis not present

## 2020-03-31 DIAGNOSIS — C9000 Multiple myeloma not having achieved remission: Secondary | ICD-10-CM

## 2020-03-31 LAB — TYPE AND SCREEN
ABO/RH(D): B POS
Antibody Screen: NEGATIVE

## 2020-03-31 MED ORDER — DIPHENHYDRAMINE HCL 50 MG/ML IJ SOLN
50.0000 mg | Freq: Once | INTRAMUSCULAR | Status: AC
  Administered 2020-03-31: 50 mg via INTRAVENOUS
  Filled 2020-03-31: qty 1

## 2020-03-31 MED ORDER — DARATUMUMAB-HYALURONIDASE-FIHJ 1800-30000 MG-UT/15ML ~~LOC~~ SOLN
1800.0000 mg | Freq: Once | SUBCUTANEOUS | Status: AC
  Administered 2020-03-31: 1800 mg via SUBCUTANEOUS
  Filled 2020-03-31: qty 15

## 2020-03-31 MED ORDER — BORTEZOMIB CHEMO SQ INJECTION 3.5 MG (2.5MG/ML)
1.3000 mg/m2 | Freq: Once | INTRAMUSCULAR | Status: AC
  Administered 2020-03-31: 2.25 mg via SUBCUTANEOUS
  Filled 2020-03-31: qty 0.9

## 2020-03-31 MED ORDER — SODIUM CHLORIDE 0.9 % IV SOLN: INTRAVENOUS | Status: DC

## 2020-03-31 MED ORDER — SODIUM CHLORIDE 0.9 % IV SOLN
20.0000 mg | Freq: Once | INTRAVENOUS | Status: AC
  Administered 2020-03-31: 20 mg via INTRAVENOUS
  Filled 2020-03-31: qty 20

## 2020-03-31 MED ORDER — MONTELUKAST SODIUM 10 MG PO TABS
10.0000 mg | ORAL_TABLET | Freq: Once | ORAL | Status: AC
  Administered 2020-03-31: 10 mg via ORAL
  Filled 2020-03-31: qty 1

## 2020-03-31 MED ORDER — ACETAMINOPHEN 325 MG PO TABS
650.0000 mg | ORAL_TABLET | Freq: Once | ORAL | Status: AC
  Administered 2020-03-31: 650 mg via ORAL
  Filled 2020-03-31: qty 2

## 2020-03-31 NOTE — Progress Notes (Signed)
Per Dr. Delton Coombes for first dose of chemotherapy give pre-medications of diphenhydramine 50 mg and dexamethasone 20 mg as intravenous medications.  Diphenhydramine 50 mg IV push and Dexamethasone 20 mg IVPB added to cycle 1 day 1.  See original chart progress note from 03/13/20.  T.O. Dr Rhys Martini, PharmD

## 2020-03-31 NOTE — Progress Notes (Signed)
Chemotherapy/Immunotherapy education packet given and discussed with pt in detail.  Discussed diagnosis, staging, tx regimen, and intent of tx.  Reviewed chemotherapy/immunotherapy medications and side effects.  Instructed on how to manage side effects at home, and when to call the clinic.  Importance of fever/chills discussed with pt and family. Discussed precautions to implement at home after receiving tx, as well as self care strategies. Phone numbers provided for clinic during regular working hours, also how to reach the clinic after hours and on weekends. Pt provided the opportunity to ask questions - all questions answered to pt's satisfaction.

## 2020-03-31 NOTE — Progress Notes (Signed)
Diamond Robbins presents today for D1C1 Daratumumab and Velcade injections. Pt denies any new changes or symptoms since last visit. Lab results from 03-25-20 office visit reviewed and are stable and within parameters for treatment. VSS, HR recheck 102 which is patients baseline HR. Type and screen and RBC phenotype drawn prior to any treatment. Findings reviewed with Dr. Delton Coombes who has approved proceeding with treatment today as planned.  Injections tolerated without incident or complaint. VSS upon completion of treatment. IV flushed and removed per protocol, see MAR and IV flowsheet for details. Discharged in satisfactory condition with follow up instructions.

## 2020-03-31 NOTE — Patient Instructions (Signed)
[facility] Discharge Instructions for Patients Receiving Chemotherapy   Beginning January 23rd 2017 lab work for the [facility] will be done in the  Main lab at [facility] on 1st floor. If you have a lab appointment with the [facility] please come in thru the  Main Entrance and check in at the main information desk   Today you received the following chemotherapy agents Velcade and Daratumumab  To help prevent nausea and vomiting after your treatment, we encourage you to take your nausea medication   If you develop nausea and vomiting, or diarrhea that is not controlled by your medication, call the clinic.  The clinic phone number is [phone]. Office hours are Monday-Friday 8:30am-5:00pm.  BELOW ARE SYMPTOMS THAT SHOULD BE REPORTED IMMEDIATELY:  *FEVER GREATER THAN 101.0 F  *CHILLS WITH OR WITHOUT FEVER  NAUSEA AND VOMITING THAT IS NOT CONTROLLED WITH YOUR NAUSEA MEDICATION  *UNUSUAL SHORTNESS OF BREATH  *UNUSUAL BRUISING OR BLEEDING  TENDERNESS IN MOUTH AND THROAT WITH OR WITHOUT PRESENCE OF ULCERS  *URINARY PROBLEMS  *BOWEL PROBLEMS  UNUSUAL RASH Items with * indicate a potential emergency and should be followed up as soon as possible. If you have an emergency after office hours please contact your primary care physician or go to the nearest emergency department.  Please call the clinic during office hours if you have any questions or concerns.   You may also contact the Patient Navigator at [phone] should you have any questions or need assistance in obtaining follow up care.      Resources For Cancer Patients and their Caregivers ? American Cancer Society: Can assist with transportation, wigs, general needs, runs Look Good Feel Better.        607 311 8666 ? Cancer Care: Provides financial assistance, online support groups, medication/co-pay assistance.  1-800-813-HOPE 325-172-5008) ? [place] Assists  [place] cancer patients and their families through emotional , educational and financial support.  267-612-1773 ? [place] DSS Where to apply for food stamps, Medicaid and utility assistance. 304-383-5238 ? [employer]: Transportation to medical appointments. (308) 174-5049 ? Social Security Administration: May apply for disability if have a Stage IV cancer. 240-257-5176 785-457-5163 ? [employer], Disability and Transit Services: Assists with nutrition, care and transit needs. 2561455198

## 2020-04-01 ENCOUNTER — Telehealth (HOSPITAL_COMMUNITY): Payer: Self-pay

## 2020-04-01 LAB — PRETREATMENT RBC PHENOTYPE

## 2020-04-01 NOTE — Telephone Encounter (Signed)
24 hr Chemo F/U call: Pt doing well without any complaints at this time. Pt encouraged to call for any problems or questions and verbalized understanding

## 2020-04-02 ENCOUNTER — Ambulatory Visit (HOSPITAL_COMMUNITY): Payer: Medicaid Other

## 2020-04-03 ENCOUNTER — Encounter (HOSPITAL_COMMUNITY): Payer: Self-pay

## 2020-04-03 ENCOUNTER — Inpatient Hospital Stay (HOSPITAL_COMMUNITY): Payer: Medicaid Other

## 2020-04-03 ENCOUNTER — Other Ambulatory Visit: Payer: Self-pay

## 2020-04-03 VITALS — BP 130/82 | HR 104 | Temp 97.3°F | Resp 18

## 2020-04-03 DIAGNOSIS — C9000 Multiple myeloma not having achieved remission: Secondary | ICD-10-CM

## 2020-04-03 DIAGNOSIS — Z5112 Encounter for antineoplastic immunotherapy: Secondary | ICD-10-CM | POA: Diagnosis not present

## 2020-04-03 DIAGNOSIS — Z794 Long term (current) use of insulin: Secondary | ICD-10-CM | POA: Diagnosis not present

## 2020-04-03 DIAGNOSIS — E876 Hypokalemia: Secondary | ICD-10-CM | POA: Diagnosis not present

## 2020-04-03 DIAGNOSIS — E109 Type 1 diabetes mellitus without complications: Secondary | ICD-10-CM | POA: Diagnosis not present

## 2020-04-03 DIAGNOSIS — I10 Essential (primary) hypertension: Secondary | ICD-10-CM | POA: Diagnosis not present

## 2020-04-03 MED ORDER — BORTEZOMIB CHEMO SQ INJECTION 3.5 MG (2.5MG/ML)
1.3000 mg/m2 | Freq: Once | INTRAMUSCULAR | Status: AC
  Administered 2020-04-03: 2.25 mg via SUBCUTANEOUS
  Filled 2020-04-03: qty 0.9

## 2020-04-03 MED ORDER — DEXAMETHASONE 4 MG PO TABS
20.0000 mg | ORAL_TABLET | Freq: Once | ORAL | Status: AC
  Administered 2020-04-03: 20 mg via ORAL
  Filled 2020-04-03: qty 5

## 2020-04-03 NOTE — Progress Notes (Signed)
HR recheck 104; pt alert, in no distress.  Okay to proceed with Velcade injection today per Lorretta Harp, NP.   Diamond Robbins presents today for injection per the provider's orders.  Velcade administration without incident; injection site WNL; see MAR for injection details.  Patient tolerated procedure well and without incident.  No questions or complaints noted at this time.  Discharged ambulatory in stable condition.

## 2020-04-04 ENCOUNTER — Other Ambulatory Visit: Payer: Self-pay

## 2020-04-04 ENCOUNTER — Encounter (HOSPITAL_COMMUNITY): Payer: Self-pay | Admitting: Emergency Medicine

## 2020-04-04 ENCOUNTER — Emergency Department (HOSPITAL_COMMUNITY)
Admission: EM | Admit: 2020-04-04 | Discharge: 2020-04-04 | Disposition: A | Discharge status: Home / Self Care | Payer: Medicaid Other | Attending: Emergency Medicine | Admitting: Emergency Medicine

## 2020-04-04 DIAGNOSIS — I10 Essential (primary) hypertension: Secondary | ICD-10-CM | POA: Insufficient documentation

## 2020-04-04 DIAGNOSIS — E10649 Type 1 diabetes mellitus with hypoglycemia without coma: Secondary | ICD-10-CM | POA: Insufficient documentation

## 2020-04-04 DIAGNOSIS — Z79899 Other long term (current) drug therapy: Secondary | ICD-10-CM | POA: Diagnosis not present

## 2020-04-04 DIAGNOSIS — E1165 Type 2 diabetes mellitus with hyperglycemia: Secondary | ICD-10-CM | POA: Diagnosis not present

## 2020-04-04 DIAGNOSIS — Z794 Long term (current) use of insulin: Secondary | ICD-10-CM | POA: Diagnosis not present

## 2020-04-04 DIAGNOSIS — Z7982 Long term (current) use of aspirin: Secondary | ICD-10-CM | POA: Insufficient documentation

## 2020-04-04 DIAGNOSIS — R739 Hyperglycemia, unspecified: Secondary | ICD-10-CM

## 2020-04-04 LAB — I-STAT CHEM 8, ED
BUN: 14 mg/dL (ref 6–20)
Calcium, Ion: 1.11 mmol/L — ABNORMAL LOW (ref 1.15–1.40)
Chloride: 103 mmol/L (ref 98–111)
Creatinine, Ser: 0.7 mg/dL (ref 0.44–1.00)
Glucose, Bld: 360 mg/dL — ABNORMAL HIGH (ref 70–99)
HCT: 31 % — ABNORMAL LOW (ref 36.0–46.0)
Hemoglobin: 10.5 g/dL — ABNORMAL LOW (ref 12.0–15.0)
Potassium: 4.5 mmol/L (ref 3.5–5.1)
Sodium: 134 mmol/L — ABNORMAL LOW (ref 135–145)
TCO2: 22 mmol/L (ref 22–32)

## 2020-04-04 LAB — CBG MONITORING, ED: Glucose-Capillary: 381 mg/dL — ABNORMAL HIGH (ref 70–99)

## 2020-04-04 MED ORDER — SODIUM CHLORIDE 0.9 % IV BOLUS: 1000.0000 mL | Freq: Once | INTRAVENOUS | Status: DC

## 2020-04-04 NOTE — ED Notes (Signed)
Pt has drank 4 cups of water and given two more.

## 2020-04-04 NOTE — Discharge Instructions (Signed)
Please follow up with your primary care provider within 5-7 days for re-evaluation of your symptoms. If you do not have a primary care provider, information for a healthcare clinic has been provided for you to make arrangements for follow up care. Please return to the emergency department for any new or worsening symptoms. ° °

## 2020-04-04 NOTE — ED Notes (Signed)
Pt says she got her Lantus pen today and took 7 units today because she only had 13 units last night as that is all her old pen had left in it. Pt says she is supposed to take 25 units at night, which she has not taken yet. Pt given cup of water and encouraged to drink. Pt is having no symptoms at this time.

## 2020-04-04 NOTE — ED Notes (Signed)
Pt has drank 3 cups of water and is working on 4th cup now.

## 2020-04-04 NOTE — ED Triage Notes (Addendum)
Pt c/o of hyperglycemia, but denies being symptomatic. Patient states she forgot to get another Lantis pen today. Pt states her CBG was 555 prior to arrival. CBG in triage is 381.

## 2020-04-04 NOTE — ED Provider Notes (Signed)
[facility] EMERGENCY DEPARTMENT Provider Note   CSN: [id number] Arrival date & time: 04/04/20  1851     History Chief Complaint  Patient presents with  . Hyperglycemia    Diamond Robbins is a 29 y.o. female.  HPI   28 year old female with a history of DKA, hypertension, type 1 diabetes, who presents emergency department today for evaluation of hyperglycemia.  States last night she was at her fianc's house and did not realize that her Lantus pen only had 13 units in it.  She states she normally takes 20 5 at night.  She took 13 last night and took the remainder of her 7 units this morning.  She checked her blood sugar at home prior to this and noted that it was 555.  She drank some fluids prior to arrival and came to the ED for further evaluation.  She denies any vomiting, nausea, abdominal pain, chest pain, shortness of breath, frequent urination or other symptoms at this time.  Past Medical History:  Diagnosis Date  . DKA (diabetic ketoacidosis) ([place]) 02/23/2020  . Hypertension   . Type 1 diabetes mellitus ([place]) 02/23/2020    Patient Active Problem List   Diagnosis Date Noted  . Multiple myeloma ([place]) 03/09/2020  . Goals of care, counseling/discussion 03/09/2020  . Pregnancy of unknown anatomic location 05/27/2019  . Chronic hypertension 05/24/2019    Past Surgical History:  Procedure Laterality Date  . NO PAST SURGERIES       OB History    Gravida  1   Para  0   Term  0   Preterm  0   AB  0   Living  0     SAB  0   TAB  0   Ectopic  0   Multiple  0   Live Births  0           Family History  Problem Relation Age of Onset  . Hypertension Mother   . Diabetes Mother   . Hypertension Father   . Diabetes Maternal Grandmother     Social History   Tobacco Use  . Smoking status: Never Smoker  . Smokeless tobacco: Never Used  Vaping Use  . Vaping Use: Never used  Substance Use Topics  . Alcohol use: Not Currently    Comment: occ  .  Drug use: No    Home Medications Prior to Admission medications   Medication Sig Start Date End Date Taking? Authorizing Provider  acyclovir (ZOVIRAX) 400 MG tablet Take 1 tablet (400 mg total) by mouth 2 (two) times daily. 03/25/20  Yes [name], MD  amLODipine (NORVASC) 10 MG tablet Take 10 mg by mouth daily. 03/06/20  Yes [provider]  aspirin EC 81 MG tablet Take 1 tablet (81 mg total) by mouth daily. Swallow whole. 03/25/20  Yes [name], MD  bortezomib IV (VELCADE) 3.5 MG injection Inject 1.3 mg/m2 into the vein once.   Yes [provider]  Daratumumab-Hyaluronidase-fihj (DARZALEX FASPRO [place]) Inject 1,800 mg into the skin.   Yes [provider]  LANTUS SOLOSTAR 100 UNIT/ML Solostar Pen Inject 25 Units into the skin at bedtime.  03/06/20  Yes [provider]  metoprolol tartrate (LOPRESSOR) 25 MG tablet Take 25 mg by mouth daily. 03/06/20  Yes [provider]  ACCU-CHEK GUIDE test strip  03/08/20   [provider]  Accu-Chek Softclix Lancets lancets 1 each 3 (three) times daily. 03/08/20   [provider]  Blood Glucose  Monitoring Suppl (ACCU-CHEK GUIDE) w/Device KIT  03/08/20   [provider]  lenalidomide (REVLIMID) 20 MG capsule Take 1 capsule (20 mg total) by mouth daily. Patient not taking: Reported on 04/04/2020 03/25/20   [name], MD    Allergies    Patient has no known allergies.  Review of Systems   Review of Systems  Constitutional: Negative for fever.  HENT: Negative for ear pain and sore throat.   Eyes: Negative for visual disturbance.  Respiratory: Negative for cough and shortness of breath.   Cardiovascular: Negative for chest pain.  Gastrointestinal: Negative for abdominal pain, constipation, diarrhea, nausea and vomiting.  Genitourinary: Negative for dysuria and hematuria.  Musculoskeletal: Negative for back pain.  Skin: Negative for rash.    Neurological: Negative for seizures and syncope.  All other systems reviewed and are negative.   Physical Exam Updated Vital Signs BP (!) 137/93   Pulse 86   Temp 97.7 F (36.5 C) (Oral)   Resp 19   Ht 5' 2"  (1.575 m)   Wt 67.6 kg   LMP 04/04/2020 (Exact Date)   SpO2 100%   BMI 27.25 kg/m   Physical Exam Vitals and nursing note reviewed.  Constitutional:      General: She is not in acute distress.    Appearance: She is well-developed.  HENT:     Head: Normocephalic and atraumatic.  Eyes:     Conjunctiva/sclera: Conjunctivae normal.  Cardiovascular:     Rate and Rhythm: Normal rate and regular rhythm.     Heart sounds: No murmur heard.   Pulmonary:     Effort: Pulmonary effort is normal. No respiratory distress.     Breath sounds: Normal breath sounds.  Abdominal:     General: Bowel sounds are normal.     Palpations: Abdomen is soft.     Tenderness: There is no abdominal tenderness.  Musculoskeletal:     Cervical back: Neck supple.  Skin:    General: Skin is warm and dry.  Neurological:     Mental Status: She is alert.     ED Results / Procedures / Treatments   Labs (all labs ordered are listed, but only abnormal results are displayed) Labs Reviewed  CBG MONITORING, ED - Abnormal; Notable for the following components:      Result Value   Glucose-Capillary 381 (*)    All other components within normal limits  I-STAT CHEM 8, ED - Abnormal; Notable for the following components:   Sodium 134 (*)    Glucose, Bld 360 (*)    Calcium, Ion 1.11 (*)    Hemoglobin 10.5 (*)    HCT 31.0 (*)    All other components within normal limits    EKG None  Radiology No results found.  Procedures Procedures (including critical care time)  Medications Ordered in ED Medications - No data to display  ED Course  I have reviewed the triage vital signs and the nursing notes.  Pertinent labs & imaging results that were available during my care of the patient were  reviewed by me and considered in my medical decision making (see chart for details).    MDM Rules/Calculators/A&P                          29 year old female presented emergency department today for evaluation of hyperglycemia.  Did not take Lantus last night and blood sugar was 555 today.  She took additional Lantus this morning but did  not take the full dose.  She has no systemic symptoms at this time.  Blood sugar in the ED elevated above 350.  Will check Chem-8 and orally hydrate patient.  Chem-8 with glucose of 360, normal bicarb. Normal anion gap at 9.  No signs of DKA today.  Advised patient to take remainder of her dose today and resume normal dose tomorrow night.  Advise follow-up with PCP and return the ED for new or worsening symptoms.  She voiced understanding of plan reasons to return but all questions answered.  Patient stable for discharge.   Final Clinical Impression(s) / ED Diagnoses Final diagnoses:  Hyperglycemia    Rx / DC Orders ED Discharge Orders    None       Bishop Dublin 04/04/20 2136    [name], MD 04/05/20 1538

## 2020-04-06 ENCOUNTER — Ambulatory Visit (HOSPITAL_COMMUNITY): Payer: Medicaid Other

## 2020-04-06 ENCOUNTER — Other Ambulatory Visit (HOSPITAL_COMMUNITY): Payer: Medicaid Other

## 2020-04-06 DIAGNOSIS — E109 Type 1 diabetes mellitus without complications: Secondary | ICD-10-CM | POA: Diagnosis not present

## 2020-04-06 DIAGNOSIS — C9000 Multiple myeloma not having achieved remission: Secondary | ICD-10-CM | POA: Diagnosis not present

## 2020-04-06 DIAGNOSIS — R000 Tachycardia, unspecified: Secondary | ICD-10-CM | POA: Diagnosis not present

## 2020-04-06 DIAGNOSIS — R8290 Unspecified abnormal findings in urine: Secondary | ICD-10-CM | POA: Diagnosis not present

## 2020-04-07 ENCOUNTER — Other Ambulatory Visit: Payer: Self-pay

## 2020-04-07 ENCOUNTER — Inpatient Hospital Stay (HOSPITAL_COMMUNITY): Payer: Medicaid Other

## 2020-04-07 ENCOUNTER — Encounter (HOSPITAL_COMMUNITY): Payer: Self-pay

## 2020-04-07 VITALS — BP 104/70 | HR 97 | Temp 98.4°F | Resp 18 | Wt 151.0 lb

## 2020-04-07 DIAGNOSIS — C9000 Multiple myeloma not having achieved remission: Secondary | ICD-10-CM

## 2020-04-07 DIAGNOSIS — I10 Essential (primary) hypertension: Secondary | ICD-10-CM | POA: Diagnosis not present

## 2020-04-07 DIAGNOSIS — E109 Type 1 diabetes mellitus without complications: Secondary | ICD-10-CM | POA: Diagnosis not present

## 2020-04-07 DIAGNOSIS — Z794 Long term (current) use of insulin: Secondary | ICD-10-CM | POA: Diagnosis not present

## 2020-04-07 DIAGNOSIS — Z5112 Encounter for antineoplastic immunotherapy: Secondary | ICD-10-CM | POA: Diagnosis not present

## 2020-04-07 DIAGNOSIS — E876 Hypokalemia: Secondary | ICD-10-CM | POA: Diagnosis not present

## 2020-04-07 LAB — CBC WITH DIFFERENTIAL/PLATELET
Abs Immature Granulocytes: 0.03 10*3/uL (ref 0.00–0.07)
Basophils Absolute: 0 10*3/uL (ref 0.0–0.1)
Basophils Relative: 0 %
Eosinophils Absolute: 0.2 10*3/uL (ref 0.0–0.5)
Eosinophils Relative: 3 %
HCT: 34.4 % — ABNORMAL LOW (ref 36.0–46.0)
Hemoglobin: 11.5 g/dL — ABNORMAL LOW (ref 12.0–15.0)
Immature Granulocytes: 0 %
Lymphocytes Relative: 12 %
Lymphs Abs: 0.9 10*3/uL (ref 0.7–4.0)
MCH: 30.7 pg (ref 26.0–34.0)
MCHC: 33.4 g/dL (ref 30.0–36.0)
MCV: 92 fL (ref 80.0–100.0)
Monocytes Absolute: 0.7 10*3/uL (ref 0.1–1.0)
Monocytes Relative: 9 %
Neutro Abs: 5.4 10*3/uL (ref 1.7–7.7)
Neutrophils Relative %: 76 %
Platelets: 304 10*3/uL (ref 150–400)
RBC: 3.74 MIL/uL — ABNORMAL LOW (ref 3.87–5.11)
RDW: 16.6 % — ABNORMAL HIGH (ref 11.5–15.5)
WBC: 7.2 10*3/uL (ref 4.0–10.5)
nRBC: 0 % (ref 0.0–0.2)

## 2020-04-07 LAB — COMPREHENSIVE METABOLIC PANEL
ALT: 13 U/L (ref 0–44)
AST: 9 U/L — ABNORMAL LOW (ref 15–41)
Albumin: 3.6 g/dL (ref 3.5–5.0)
Alkaline Phosphatase: 93 U/L (ref 38–126)
Anion gap: 11 (ref 5–15)
BUN: 6 mg/dL (ref 6–20)
CO2: 24 mmol/L (ref 22–32)
Calcium: 8.9 mg/dL (ref 8.9–10.3)
Chloride: 101 mmol/L (ref 98–111)
Creatinine, Ser: 0.61 mg/dL (ref 0.44–1.00)
GFR, Estimated: >60 mL/min (ref >60)
Glucose, Bld: 181 mg/dL — ABNORMAL HIGH (ref 70–99)
Potassium: 3.3 mmol/L — ABNORMAL LOW (ref 3.5–5.1)
Sodium: 136 mmol/L (ref 135–145)
Total Bilirubin: 0.5 mg/dL (ref 0.3–1.2)
Total Protein: 7.5 g/dL (ref 6.5–8.1)

## 2020-04-07 MED ORDER — DARATUMUMAB-HYALURONIDASE-FIHJ 1800-30000 MG-UT/15ML ~~LOC~~ SOLN
1800.0000 mg | Freq: Once | SUBCUTANEOUS | Status: AC
  Administered 2020-04-07: 1800 mg via SUBCUTANEOUS
  Filled 2020-04-07: qty 15

## 2020-04-07 MED ORDER — DEXAMETHASONE 4 MG PO TABS
20.0000 mg | ORAL_TABLET | Freq: Once | ORAL | Status: AC
  Administered 2020-04-07: 20 mg via ORAL
  Filled 2020-04-07: qty 5

## 2020-04-07 MED ORDER — ACETAMINOPHEN 325 MG PO TABS
650.0000 mg | ORAL_TABLET | Freq: Once | ORAL | Status: AC
  Administered 2020-04-07: 650 mg via ORAL
  Filled 2020-04-07: qty 2

## 2020-04-07 MED ORDER — DIPHENHYDRAMINE HCL 25 MG PO CAPS
50.0000 mg | ORAL_CAPSULE | Freq: Once | ORAL | Status: AC
  Administered 2020-04-07: 50 mg via ORAL
  Filled 2020-04-07: qty 2

## 2020-04-07 MED ORDER — BORTEZOMIB CHEMO SQ INJECTION 3.5 MG (2.5MG/ML)
1.3000 mg/m2 | Freq: Once | INTRAMUSCULAR | Status: AC
  Administered 2020-04-07: 2.25 mg via SUBCUTANEOUS
  Filled 2020-04-07: qty 0.9

## 2020-04-07 MED ORDER — MONTELUKAST SODIUM 10 MG PO TABS
10.0000 mg | ORAL_TABLET | Freq: Once | ORAL | Status: AC
  Administered 2020-04-07: 10 mg via ORAL
  Filled 2020-04-07: qty 1

## 2020-04-07 NOTE — Progress Notes (Signed)
Pt here for C1D8.  No complaints today and no pain. Labs are WNL limits for treatment.   Tolerated treatment well today.  1 Hr wait time post dara.  Vital signs stable prior to discharge.  Discharged in stable condition ambulatory.

## 2020-04-07 NOTE — Patient Instructions (Signed)
[facility] Discharge Instructions for Patients Receiving Chemotherapy  Today you received the following chemotherapy agents   To help prevent nausea and vomiting after your treatment, we encourage you to take your nausea medication   If you develop nausea and vomiting that is not controlled by your nausea medication, call the clinic.   BELOW ARE SYMPTOMS THAT SHOULD BE REPORTED IMMEDIATELY:  *FEVER GREATER THAN 100.5 F  *CHILLS WITH OR WITHOUT FEVER  NAUSEA AND VOMITING THAT IS NOT CONTROLLED WITH YOUR NAUSEA MEDICATION  *UNUSUAL SHORTNESS OF BREATH  *UNUSUAL BRUISING OR BLEEDING  TENDERNESS IN MOUTH AND THROAT WITH OR WITHOUT PRESENCE OF ULCERS  *URINARY PROBLEMS  *BOWEL PROBLEMS  UNUSUAL RASH Items with * indicate a potential emergency and should be followed up as soon as possible.  Feel free to call the clinic should you have any questions or concerns. The clinic phone number is [phone].  Please show the CHEMO ALERT CARD at check-in to the Emergency Department and triage nurse.   

## 2020-04-13 ENCOUNTER — Ambulatory Visit (HOSPITAL_COMMUNITY): Payer: Medicaid Other

## 2020-04-13 ENCOUNTER — Other Ambulatory Visit (HOSPITAL_COMMUNITY): Payer: Medicaid Other

## 2020-04-13 ENCOUNTER — Ambulatory Visit (HOSPITAL_COMMUNITY): Payer: Medicaid Other | Admitting: Hematology

## 2020-04-14 ENCOUNTER — Inpatient Hospital Stay (HOSPITAL_BASED_OUTPATIENT_CLINIC_OR_DEPARTMENT_OTHER): Payer: Medicaid Other | Admitting: Hematology

## 2020-04-14 ENCOUNTER — Inpatient Hospital Stay (HOSPITAL_COMMUNITY): Payer: Medicaid Other

## 2020-04-14 ENCOUNTER — Telehealth: Payer: Medicaid Other | Admitting: Internal Medicine

## 2020-04-14 ENCOUNTER — Other Ambulatory Visit (HOSPITAL_COMMUNITY): Payer: Self-pay

## 2020-04-14 ENCOUNTER — Other Ambulatory Visit: Payer: Self-pay

## 2020-04-14 VITALS — BP 125/89 | HR 120 | Temp 98.4°F | Resp 18 | Wt 147.4 lb

## 2020-04-14 DIAGNOSIS — Z794 Long term (current) use of insulin: Secondary | ICD-10-CM | POA: Diagnosis not present

## 2020-04-14 DIAGNOSIS — C9000 Multiple myeloma not having achieved remission: Secondary | ICD-10-CM

## 2020-04-14 DIAGNOSIS — E109 Type 1 diabetes mellitus without complications: Secondary | ICD-10-CM | POA: Diagnosis not present

## 2020-04-14 DIAGNOSIS — E876 Hypokalemia: Secondary | ICD-10-CM | POA: Diagnosis not present

## 2020-04-14 DIAGNOSIS — I10 Essential (primary) hypertension: Secondary | ICD-10-CM | POA: Diagnosis not present

## 2020-04-14 DIAGNOSIS — Z5112 Encounter for antineoplastic immunotherapy: Secondary | ICD-10-CM | POA: Diagnosis not present

## 2020-04-14 LAB — COMPREHENSIVE METABOLIC PANEL
ALT: 18 U/L (ref 0–44)
AST: 16 U/L (ref 15–41)
Albumin: 3.7 g/dL (ref 3.5–5.0)
Alkaline Phosphatase: 92 U/L (ref 38–126)
Anion gap: 11 (ref 5–15)
BUN: <5 mg/dL — ABNORMAL LOW (ref 6–20)
CO2: 23 mmol/L (ref 22–32)
Calcium: 8.9 mg/dL (ref 8.9–10.3)
Chloride: 104 mmol/L (ref 98–111)
Creatinine, Ser: 0.54 mg/dL (ref 0.44–1.00)
GFR, Estimated: >60 mL/min (ref >60)
Glucose, Bld: 162 mg/dL — ABNORMAL HIGH (ref 70–99)
Potassium: 3.1 mmol/L — ABNORMAL LOW (ref 3.5–5.1)
Sodium: 138 mmol/L (ref 135–145)
Total Bilirubin: 0.5 mg/dL (ref 0.3–1.2)
Total Protein: 7.5 g/dL (ref 6.5–8.1)

## 2020-04-14 LAB — CBC WITH DIFFERENTIAL/PLATELET
Abs Immature Granulocytes: 0.04 10*3/uL (ref 0.00–0.07)
Basophils Absolute: 0 10*3/uL (ref 0.0–0.1)
Basophils Relative: 0 %
Eosinophils Absolute: 0.2 10*3/uL (ref 0.0–0.5)
Eosinophils Relative: 4 %
HCT: 39.2 % (ref 36.0–46.0)
Hemoglobin: 12.7 g/dL (ref 12.0–15.0)
Immature Granulocytes: 1 %
Lymphocytes Relative: 10 %
Lymphs Abs: 0.6 10*3/uL — ABNORMAL LOW (ref 0.7–4.0)
MCH: 29.9 pg (ref 26.0–34.0)
MCHC: 32.4 g/dL (ref 30.0–36.0)
MCV: 92.2 fL (ref 80.0–100.0)
Monocytes Absolute: 0.9 10*3/uL (ref 0.1–1.0)
Monocytes Relative: 15 %
Neutro Abs: 4.1 10*3/uL (ref 1.7–7.7)
Neutrophils Relative %: 70 %
Platelets: 343 10*3/uL (ref 150–400)
RBC: 4.25 MIL/uL (ref 3.87–5.11)
RDW: 15.6 % — ABNORMAL HIGH (ref 11.5–15.5)
WBC: 5.9 10*3/uL (ref 4.0–10.5)
nRBC: 0 % (ref 0.0–0.2)

## 2020-04-14 LAB — PREGNANCY, URINE: Preg Test, Ur: NEGATIVE

## 2020-04-14 MED ORDER — DIPHENHYDRAMINE HCL 25 MG PO CAPS
50.0000 mg | ORAL_CAPSULE | Freq: Once | ORAL | Status: AC
  Administered 2020-04-14: 50 mg via ORAL
  Filled 2020-04-14: qty 2

## 2020-04-14 MED ORDER — POTASSIUM CHLORIDE CRYS ER 20 MEQ PO TBCR: 20.0000 meq | EXTENDED_RELEASE_TABLET | Freq: Every day | ORAL | 0 refills | Status: DC

## 2020-04-14 MED ORDER — ACETAMINOPHEN 325 MG PO TABS
650.0000 mg | ORAL_TABLET | Freq: Once | ORAL | Status: AC
  Administered 2020-04-14: 650 mg via ORAL
  Filled 2020-04-14: qty 2

## 2020-04-14 MED ORDER — DARATUMUMAB-HYALURONIDASE-FIHJ 1800-30000 MG-UT/15ML ~~LOC~~ SOLN
1800.0000 mg | Freq: Once | SUBCUTANEOUS | Status: AC
  Administered 2020-04-14: 1800 mg via SUBCUTANEOUS
  Filled 2020-04-14: qty 15

## 2020-04-14 MED ORDER — LENALIDOMIDE 20 MG PO CAPS
20.0000 mg | ORAL_CAPSULE | Freq: Every day | ORAL | 0 refills | Status: DC
Start: 2020-04-14 — End: 2020-05-05

## 2020-04-14 MED ORDER — MONTELUKAST SODIUM 10 MG PO TABS
10.0000 mg | ORAL_TABLET | Freq: Once | ORAL | Status: AC
  Administered 2020-04-14: 10 mg via ORAL
  Filled 2020-04-14: qty 1

## 2020-04-14 MED ORDER — DEXAMETHASONE 4 MG PO TABS
20.0000 mg | ORAL_TABLET | Freq: Once | ORAL | Status: AC
  Administered 2020-04-14: 20 mg via ORAL
  Filled 2020-04-14: qty 5

## 2020-04-14 NOTE — Progress Notes (Signed)
Patient has been examined, vital signs and labs have been reviewed by Dr. Delton Coombes. ANC, Creatinine, LFTs, hemoglobin, and platelets are within treatment parameters per Dr. Delton Coombes. Patient is okay to proceed with treatment per M.D.   Lubna Stegeman presents today for injection per the provider's orders.  Darazalex Faspro administration without incident; injection site WNL; see MAR for injection details.  Patient tolerated procedure well and without incident.  No questions or complaints noted at this time.  Discharged ambulatory in stable condition.

## 2020-04-14 NOTE — Progress Notes (Signed)
[place], [place] [address]   CLINIC:  Medical Oncology/Hematology  PCP:  Patient, No Pcp Per None None   REASON FOR VISIT:  Follow-up for IgA kappa multiple myeloma  PRIOR THERAPY: None  NGS Results: Not done  CURRENT THERAPY: RVD and Darzalex Faspro weekly  BRIEF ONCOLOGIC HISTORY:  Oncology History  Multiple myeloma ([place])  03/09/2020 Initial Diagnosis   Multiple myeloma ([place])   03/31/2020 -  Chemotherapy   The patient had dexamethasone (DECADRON) tablet 20 mg, 20 mg, Oral,  Once, 1 of 8 cycles Administration: 20 mg (04/03/2020) dexamethasone (DECADRON) 4 MG tablet, 2 of 13 cycles Administration: 20 mg (04/07/2020) daratumumab-hyaluronidase-fihj (DARZALEX FASPRO) 1800-30000 MG-UT/15ML chemo SQ injection 1,800 mg, 1,800 mg, Subcutaneous,  Once, 1 of 12 cycles Administration: 1,800 mg (03/31/2020), 1,800 mg (04/07/2020) bortezomib SQ (VELCADE) chemo injection (2.5mg /mL concentration) 2.25 mg, 1.3 mg/m2 = 2.25 mg, Subcutaneous,  Once, 1 of 8 cycles Administration: 2.25 mg (03/31/2020), 2.25 mg (04/03/2020), 2.25 mg (04/07/2020)  for chemotherapy treatment.      CANCER STAGING: Cancer Staging No matching staging information was found for the patient.  INTERVAL HISTORY:  Diamond Robbins, a 29 y.o. female, returns for routine follow-up and consideration for next cycle of chemotherapy. Diamond Robbins was last seen on 03/25/2020.  Due for day #15 of cycle #1 of Velcade and Darzalex Faspro today.   Overall, she tells me she has been feeling pretty well. She tolerated the previous treatment well and denies N/V/D/C, numbness or tingling, extreme fatigue, extremity weakness or rashes. She reports having some discoloration after getting the shot. She took her last Revlimid yesterday. She is up to 30 units of Lantus at bedtime. She is trying to eat healthier to control her diabetes. She denies having jitteriness or restlessness since starting  Decadron.  Overall, she feels ready for next cycle of chemo today.    REVIEW OF SYSTEMS:  Review of Systems  Constitutional: Positive for fatigue (95%). Negative for appetite change.  Gastrointestinal: Negative for constipation, diarrhea, nausea and vomiting.  Skin: Negative for rash.  Neurological: Negative for extremity weakness and numbness.  All other systems reviewed and are negative.   PAST MEDICAL/SURGICAL HISTORY:  Past Medical History:  Diagnosis Date  . DKA (diabetic ketoacidosis) ([place]) 02/23/2020  . Hypertension   . Type 1 diabetes mellitus ([place]) 02/23/2020   Past Surgical History:  Procedure Laterality Date  . NO PAST SURGERIES      SOCIAL HISTORY:  Social History   Socioeconomic History  . Marital status: Single    Spouse name: Not on file  . Number of children: Not on file  . Years of education: Not on file  . Highest education level: Not on file  Occupational History  . Occupation: unemployed  Tobacco Use  . Smoking status: Never Smoker  . Smokeless tobacco: Never Used  Vaping Use  . Vaping Use: Never used  Substance and Sexual Activity  . Alcohol use: Not Currently    Comment: occ  . Drug use: No  . Sexual activity: Yes    Birth control/protection: Condom  Other Topics Concern  . Not on file  Social History Narrative   Pt is engaged   Social Determinants of Health   Financial Resource Strain: Low Risk   . Difficulty of Paying Living Expenses: Not hard at all  Food Insecurity: No Food Insecurity  . Worried About Charity fundraiser in the Last Year: Never true  . Ran  Out of Food in the Last Year: Never true  Transportation Needs: No Transportation Needs  . Lack of Transportation (Medical): No  . Lack of Transportation (Non-Medical): No  Physical Activity: Insufficiently Active  . Days of Exercise per Week: 3 days  . Minutes of Exercise per Session: 10 min  Stress: No Stress Concern Present  . Feeling of Stress : Not at all  Social  Connections: Unknown  . Frequency of Communication with Friends and Family: More than three times a week  . Frequency of Social Gatherings with Friends and Family: More than three times a week  . Attends Religious Services: More than 4 times per year  . Active Member of Clubs or Organizations: No  . Attends Archivist Meetings: More than 4 times per year  . Marital Status: Not on file  Intimate Partner Violence: Not At Risk  . Fear of Current or Ex-Partner: No  . Emotionally Abused: No  . Physically Abused: No  . Sexually Abused: No    FAMILY HISTORY:  Family History  Problem Relation Age of Onset  . Hypertension Mother   . Diabetes Mother   . Hypertension Father   . Diabetes Maternal Grandmother     CURRENT MEDICATIONS:  Current Outpatient Medications  Medication Sig Dispense Refill  . ACCU-CHEK GUIDE test strip     . Accu-Chek Softclix Lancets lancets 1 each 3 (three) times daily.    Marland Kitchen acyclovir (ZOVIRAX) 400 MG tablet Take 1 tablet (400 mg total) by mouth 2 (two) times daily. 60 tablet 6  . amLODipine (NORVASC) 10 MG tablet Take 10 mg by mouth daily.    Marland Kitchen aspirin EC 81 MG tablet Take 1 tablet (81 mg total) by mouth daily. Swallow whole. 30 tablet 11  . Blood Glucose Monitoring Suppl (ACCU-CHEK GUIDE) w/Device KIT     . bortezomib IV (VELCADE) 3.5 MG injection Inject 1.3 mg/m2 into the vein once.    . Daratumumab-Hyaluronidase-fihj (DARZALEX FASPRO Pe Ell) Inject 1,800 mg into the skin.    Marland Kitchen LANTUS SOLOSTAR 100 UNIT/ML Solostar Pen Inject 25 Units into the skin at bedtime.     Marland Kitchen lenalidomide (REVLIMID) 20 MG capsule Take 1 capsule (20 mg total) by mouth daily. 14 capsule 0  . metoprolol tartrate (LOPRESSOR) 25 MG tablet Take 25 mg by mouth daily.    . potassium chloride SA (KLOR-CON) 20 MEQ tablet Take 1 tablet (20 mEq total) by mouth daily. 30 tablet 0   No current facility-administered medications for this visit.    ALLERGIES:  No Known Allergies  PHYSICAL EXAM:   Performance status (ECOG): 1 - Symptomatic but completely ambulatory  Vitals:   04/14/20 0852  BP: 125/89  Pulse: (!) 120  Resp: 18  Temp: 98.4 F (36.9 C)  SpO2: 98%   Wt Readings from Last 3 Encounters:  04/14/20 147 lb 6.4 oz (66.9 kg)  04/07/20 151 lb (68.5 kg)  04/04/20 149 lb (67.6 kg)   Physical Exam Vitals reviewed.  Constitutional:      Appearance: Normal appearance.  Cardiovascular:     Rate and Rhythm: Normal rate and regular rhythm.     Pulses: Normal pulses.     Heart sounds: Normal heart sounds.  Pulmonary:     Effort: Pulmonary effort is normal.     Breath sounds: Normal breath sounds.  Musculoskeletal:     Right lower leg: No edema.     Left lower leg: No edema.  Neurological:     General: No  focal deficit present.     Mental Status: She is alert and oriented to person, place, and time.  Psychiatric:        Mood and Affect: Mood normal.        Behavior: Behavior normal.     LABORATORY DATA:  I have reviewed the labs as listed.  CBC Latest Ref Rng & Units 04/14/2020 04/07/2020 04/04/2020  WBC 4.0 - 10.5 K/uL 5.9 7.2 -  Hemoglobin 12.0 - 15.0 g/dL 12.7 11.5(L) 10.5(L)  Hematocrit 36 - 46 % 39.2 34.4(L) 31.0(L)  Platelets 150 - 400 K/uL 343 304 -   CMP Latest Ref Rng & Units 04/14/2020 04/07/2020 04/04/2020  Glucose 70 - 99 mg/dL 162(H) 181(H) 360(H)  BUN 6 - 20 mg/dL <5(L) 6 14  Creatinine 0.44 - 1.00 mg/dL 0.54 0.61 0.70  Sodium 135 - 145 mmol/L 138 136 134(L)  Potassium 3.5 - 5.1 mmol/L 3.1(L) 3.3(L) 4.5  Chloride 98 - 111 mmol/L 104 101 103  CO2 22 - 32 mmol/L 23 24 -  Calcium 8.9 - 10.3 mg/dL 8.9 8.9 -  Total Protein 6.5 - 8.1 g/dL 7.5 7.5 -  Total Bilirubin 0.3 - 1.2 mg/dL 0.5 0.5 -  Alkaline Phos 38 - 126 U/L 92 93 -  AST 15 - 41 U/L 16 9(L) -  ALT 0 - 44 U/L 18 13 -    DIAGNOSTIC IMAGING:  I have independently reviewed the scans and discussed with the patient. NM PET Image Initial (PI) Whole Body  Result Date:  03/25/2020 CLINICAL DATA:  Initial treatment strategy for multiple myeloma. EXAM: NUCLEAR MEDICINE PET WHOLE BODY TECHNIQUE: 8.3 mCi F-18 FDG was injected intravenously. Full-ring PET imaging was performed from the head to foot after the radiotracer. CT data was obtained and used for attenuation correction and anatomic localization. Fasting blood glucose: 178 mg/dl COMPARISON:  None. FINDINGS: Mediastinal blood pool activity: SUV max 2.8 HEAD/NECK: No hypermetabolic activity in the scalp. No hypermetabolic cervical lymph nodes. Incidental CT findings: None CHEST: No hypermetabolic mediastinal or hilar nodes. No suspicious pulmonary nodules on the CT scan. Incidental CT findings: none ABDOMEN/PELVIS: No abnormal hypermetabolic activity within the liver, pancreas, adrenal glands, or spleen. No hypermetabolic lymph nodes in the abdomen or pelvis. Incidental CT findings: Normal volume spleen. Soft tissue plasmacytoma. SKELETON: No focal hypermetabolic activity to suggest skeletal metastasis. Incidental CT findings: Extensive diffuse lucencies through the iliac bones and sacrum. Findings extend into the lumbar vertebral bodies with there multiple small lucent lesions. Similar findings in the thoracic vertebral bodies. Small lucent lesions within the scapula. EXTREMITIES: No abnormal hypermetabolic activity in the lower extremities. Incidental CT findings: No significant lucent lesions identified. IMPRESSION: 1. No focal FDG radiotracer activity to suggest hypermetabolic active multiple myeloma. 2. No soft tissue plasmacytoma. 3. Diffuse multiple small lytic lesions within the pelvis, spine and vertebral bodies is a pattern typical of multiple myeloma. Electronically Signed   By: Suzy Bouchard M.D.   On: 03/25/2020 16:57     ASSESSMENT:  1.  Stage IIIIgA kappa multiple myeloma: -Presentation to [facility] in [place] with confusion and weakness. -Found to have diabetic ketoacidosis with  newly diagnosed type 1 diabetes. -Also found to have acute renal failure and hypercalcemia. -Bone marrow biopsy on 02/25/2020 with flow cytometry with the population CD138 positive, CD56 positive IgA kappa monoclonal plasma cells 12%. 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 -Ultrasound abdomen on 02/23/2020 showed  increased hepatic echogenicity with normal spleen. -Renal ultrasound on 02/24/2020 was normal. -CT chest on 02/22/2020 withmoth eatenappearance of the bones. -Beta-2 microglobulin 2.1.  LDH 322.  SPEP and immunofixation were normal.  Kappa light chains are elevated at 36.8.  Lambda light chains 12.9 and ratio of 2.85. -24-hour urine immunofixation was negative.  M spike was negative. -PET scan on 03/23/2020 showed no FDG radiotracer activity, no soft tissue plasmacytoma.  Diffuse multiple small lytic lesions within the pelvis, spine and vertebral bodies consistent with myeloma. -Chromosome analysis was 66, XX.  Multiple myeloma FISH panel was normal. -She is considered high risk based on elevated LDH level. -Cycle 1 of Velcade, Revlimid, Darzalex and dexamethasone started on 03/31/2020.  2. 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 Non-smoker. -No family history of myeloma or other malignancies.   PLAN:  1.  Stage IIIIgA kappa multiple myeloma: -She decided not to do fertility preservation because of cost reasons.  She started cycle 1 of treatment on 03/31/2020. -She has tolerated Revlimid very well.  Did not experience any major side effects from Velcade and Darzalex. -Reviewed labs from 04/14/2020 which showed normal creatinine and calcium levels.  CBC was also normal. -We will proceed with day 15 of Darzalex today. -She will start her next cycle of Revlimid next week along with cycle 3. -RTC 4 weeks with labs.  2.  Type I diabetes: -Continue Lantus 25 units at bedtime.  3. Hypertension: -Continue Norvasc 10 mg and metoprolol 25 mg daily.  4.  Hypokalemia: -Potassium today is 3.1.  We will start her on potassium 20 mEq daily.   Orders placed this encounter:  No orders of the defined types were placed in this encounter.    [name], MD [place] [phone]   I, Milinda Antis, am acting as a scribe for Dr. Sanda Linger.  I, [name] MD, have reviewed the above documentation for accuracy and completeness, and I agree with the above.

## 2020-04-14 NOTE — Progress Notes (Signed)
Patient has been taking Revlimid as prescribed and denies any side effects.

## 2020-04-14 NOTE — Telephone Encounter (Signed)
Revlimid refilled per Dr. Katragadda 

## 2020-04-14 NOTE — Progress Notes (Signed)
Patient was assessed by Dr. Katragadda and labs have been reviewed.  Patient is okay to proceed with treatment today. Primary RN and pharmacy aware.   

## 2020-04-14 NOTE — Patient Instructions (Signed)
[place] at [facility] Discharge Instructions  You were seen today by Dr. Delton Coombes. He went over your recent results. You received your treatment today; continue getting your treatment weekly. You will be prescribed potassium 20 mEq to take daily. Dr. Delton Coombes will see you back in 4 weeks for labs and follow up.   Thank you for choosing [place] at [facility] to provide your oncology and hematology care.  To afford each patient quality time with our provider, please arrive at least 15 minutes before your scheduled appointment time.   If you have a lab appointment with the [facility] please come in thru the Main Entrance and check in at the main information desk  You need to re-schedule your appointment should you arrive 10 or more minutes late.  We strive to give you quality time with our providers, and arriving late affects you and other patients whose appointments are after yours.  Also, if you no show three or more times for appointments you may be dismissed from the clinic at the providers discretion.     Again, thank you for choosing [facility].  Our hope is that these requests will decrease the amount of time that you wait before being seen by our physicians.       _____________________________________________________________  Should you have questions after your visit to [facility], please contact our office at [phone] between the hours of 8:00 a.m. and 4:30 p.m.  Voicemails left after 4:00 p.m. will not be returned until the following business day.  For prescription refill requests, have your pharmacy contact our office and allow 72 hours.    Cancer Center Support Programs:   > Cancer Support Group  2nd Tuesday of the month 1pm-2pm, Journey Room

## 2020-04-15 DIAGNOSIS — Z419 Encounter for procedure for purposes other than remedying health state, unspecified: Secondary | ICD-10-CM | POA: Diagnosis not present

## 2020-04-21 ENCOUNTER — Inpatient Hospital Stay (HOSPITAL_COMMUNITY): Payer: Medicaid Other

## 2020-04-21 ENCOUNTER — Other Ambulatory Visit: Payer: Self-pay

## 2020-04-21 ENCOUNTER — Other Ambulatory Visit: Payer: Self-pay | Admitting: Hematology and Oncology

## 2020-04-21 ENCOUNTER — Inpatient Hospital Stay (HOSPITAL_COMMUNITY): Payer: Medicaid Other | Attending: Hematology and Oncology

## 2020-04-21 ENCOUNTER — Encounter (HOSPITAL_COMMUNITY): Payer: Self-pay

## 2020-04-21 VITALS — BP 122/83 | HR 96 | Temp 97.4°F | Resp 17

## 2020-04-21 DIAGNOSIS — Z79899 Other long term (current) drug therapy: Secondary | ICD-10-CM | POA: Insufficient documentation

## 2020-04-21 DIAGNOSIS — E876 Hypokalemia: Secondary | ICD-10-CM | POA: Diagnosis not present

## 2020-04-21 DIAGNOSIS — C9000 Multiple myeloma not having achieved remission: Secondary | ICD-10-CM | POA: Insufficient documentation

## 2020-04-21 DIAGNOSIS — Z794 Long term (current) use of insulin: Secondary | ICD-10-CM | POA: Diagnosis not present

## 2020-04-21 DIAGNOSIS — Z5112 Encounter for antineoplastic immunotherapy: Secondary | ICD-10-CM | POA: Diagnosis not present

## 2020-04-21 DIAGNOSIS — E108 Type 1 diabetes mellitus with unspecified complications: Secondary | ICD-10-CM | POA: Diagnosis not present

## 2020-04-21 DIAGNOSIS — I10 Essential (primary) hypertension: Secondary | ICD-10-CM | POA: Insufficient documentation

## 2020-04-21 LAB — CBC WITH DIFFERENTIAL/PLATELET
Abs Immature Granulocytes: 0.01 10*3/uL (ref 0.00–0.07)
Basophils Absolute: 0.1 10*3/uL (ref 0.0–0.1)
Basophils Relative: 1 %
Eosinophils Absolute: 0.1 10*3/uL (ref 0.0–0.5)
Eosinophils Relative: 2 %
HCT: 37.3 % (ref 36.0–46.0)
Hemoglobin: 12 g/dL (ref 12.0–15.0)
Immature Granulocytes: 0 %
Lymphocytes Relative: 22 %
Lymphs Abs: 1 10*3/uL (ref 0.7–4.0)
MCH: 30.2 pg (ref 26.0–34.0)
MCHC: 32.2 g/dL (ref 30.0–36.0)
MCV: 94 fL (ref 80.0–100.0)
Monocytes Absolute: 0.8 10*3/uL (ref 0.1–1.0)
Monocytes Relative: 17 %
Neutro Abs: 2.7 10*3/uL (ref 1.7–7.7)
Neutrophils Relative %: 58 %
Platelets: 497 10*3/uL — ABNORMAL HIGH (ref 150–400)
RBC: 3.97 MIL/uL (ref 3.87–5.11)
RDW: 15.4 % (ref 11.5–15.5)
WBC: 4.6 10*3/uL (ref 4.0–10.5)
nRBC: 0 % (ref 0.0–0.2)

## 2020-04-21 LAB — COMPREHENSIVE METABOLIC PANEL
ALT: 14 U/L (ref 0–44)
AST: 12 U/L — ABNORMAL LOW (ref 15–41)
Albumin: 3.8 g/dL (ref 3.5–5.0)
Alkaline Phosphatase: 116 U/L (ref 38–126)
Anion gap: 10 (ref 5–15)
BUN: 7 mg/dL (ref 6–20)
CO2: 22 mmol/L (ref 22–32)
Calcium: 9 mg/dL (ref 8.9–10.3)
Chloride: 105 mmol/L (ref 98–111)
Creatinine, Ser: 0.56 mg/dL (ref 0.44–1.00)
GFR, Estimated: >60 mL/min (ref >60)
Glucose, Bld: 149 mg/dL — ABNORMAL HIGH (ref 70–99)
Potassium: 3.5 mmol/L (ref 3.5–5.1)
Sodium: 137 mmol/L (ref 135–145)
Total Bilirubin: 0.4 mg/dL (ref 0.3–1.2)
Total Protein: 7.7 g/dL (ref 6.5–8.1)

## 2020-04-21 MED ORDER — DIPHENHYDRAMINE HCL 25 MG PO CAPS
ORAL_CAPSULE | ORAL | Status: AC
  Filled 2020-04-21: qty 2

## 2020-04-21 MED ORDER — DEXAMETHASONE 4 MG PO TABS
20.0000 mg | ORAL_TABLET | Freq: Once | ORAL | Status: AC
  Administered 2020-04-21: 20 mg via ORAL

## 2020-04-21 MED ORDER — ACETAMINOPHEN 325 MG PO TABS
ORAL_TABLET | ORAL | Status: AC
  Filled 2020-04-21: qty 2

## 2020-04-21 MED ORDER — DEXAMETHASONE 4 MG PO TABS
ORAL_TABLET | ORAL | Status: AC
  Filled 2020-04-21: qty 1

## 2020-04-21 MED ORDER — DIPHENHYDRAMINE HCL 25 MG PO CAPS
50.0000 mg | ORAL_CAPSULE | Freq: Once | ORAL | Status: AC
  Administered 2020-04-21: 50 mg via ORAL

## 2020-04-21 MED ORDER — DEXAMETHASONE 4 MG PO TABS
ORAL_TABLET | ORAL | Status: AC
  Filled 2020-04-21: qty 5

## 2020-04-21 MED ORDER — DARATUMUMAB-HYALURONIDASE-FIHJ 1800-30000 MG-UT/15ML ~~LOC~~ SOLN
1800.0000 mg | Freq: Once | SUBCUTANEOUS | Status: AC
  Administered 2020-04-21: 1800 mg via SUBCUTANEOUS
  Filled 2020-04-21: qty 15

## 2020-04-21 MED ORDER — ACETAMINOPHEN 325 MG PO TABS
650.0000 mg | ORAL_TABLET | Freq: Once | ORAL | Status: AC
  Administered 2020-04-21: 650 mg via ORAL

## 2020-04-21 MED ORDER — BORTEZOMIB CHEMO SQ INJECTION 3.5 MG (2.5MG/ML)
1.3000 mg/m2 | Freq: Once | INTRAMUSCULAR | Status: AC
  Administered 2020-04-21: 2.25 mg via SUBCUTANEOUS
  Filled 2020-04-21: qty 0.9

## 2020-04-21 NOTE — Progress Notes (Signed)
Diamond Robbins presents today for injection per the provider's orders.  Velcade and Darzalex Faspro administrations without incident; injection site WNL; see MAR for injection details.  Patient tolerated procedure well and without incident.  No questions or complaints noted at this time.  Discharged ambulatory in stable condition.

## 2020-04-24 ENCOUNTER — Inpatient Hospital Stay (HOSPITAL_COMMUNITY): Payer: Medicaid Other

## 2020-04-24 ENCOUNTER — Other Ambulatory Visit: Payer: Self-pay

## 2020-04-24 ENCOUNTER — Other Ambulatory Visit (HOSPITAL_COMMUNITY): Payer: Medicaid Other

## 2020-04-24 VITALS — BP 118/77 | HR 99 | Temp 97.5°F | Resp 18

## 2020-04-24 DIAGNOSIS — Z5112 Encounter for antineoplastic immunotherapy: Secondary | ICD-10-CM | POA: Diagnosis not present

## 2020-04-24 DIAGNOSIS — C9000 Multiple myeloma not having achieved remission: Secondary | ICD-10-CM | POA: Diagnosis not present

## 2020-04-24 DIAGNOSIS — E876 Hypokalemia: Secondary | ICD-10-CM | POA: Diagnosis not present

## 2020-04-24 DIAGNOSIS — E108 Type 1 diabetes mellitus with unspecified complications: Secondary | ICD-10-CM | POA: Diagnosis not present

## 2020-04-24 DIAGNOSIS — I10 Essential (primary) hypertension: Secondary | ICD-10-CM | POA: Diagnosis not present

## 2020-04-24 DIAGNOSIS — Z794 Long term (current) use of insulin: Secondary | ICD-10-CM | POA: Diagnosis not present

## 2020-04-24 DIAGNOSIS — Z79899 Other long term (current) drug therapy: Secondary | ICD-10-CM | POA: Diagnosis not present

## 2020-04-24 MED ORDER — DEXAMETHASONE 4 MG PO TABS
20.0000 mg | ORAL_TABLET | Freq: Once | ORAL | Status: AC
  Administered 2020-04-24: 20 mg via ORAL
  Filled 2020-04-24: qty 5

## 2020-04-24 MED ORDER — BORTEZOMIB CHEMO SQ INJECTION 3.5 MG (2.5MG/ML)
1.3000 mg/m2 | Freq: Once | INTRAMUSCULAR | Status: AC
  Administered 2020-04-24: 2.25 mg via SUBCUTANEOUS
  Filled 2020-04-24: qty 0.9

## 2020-04-24 NOTE — Patient Instructions (Signed)
[facility] Discharge Instructions for Patients Receiving Chemotherapy  Today you received the following chemotherapy agents   To help prevent nausea and vomiting after your treatment, we encourage you to take your nausea medication   If you develop nausea and vomiting that is not controlled by your nausea medication, call the clinic.   BELOW ARE SYMPTOMS THAT SHOULD BE REPORTED IMMEDIATELY:  *FEVER GREATER THAN 100.5 F  *CHILLS WITH OR WITHOUT FEVER  NAUSEA AND VOMITING THAT IS NOT CONTROLLED WITH YOUR NAUSEA MEDICATION  *UNUSUAL SHORTNESS OF BREATH  *UNUSUAL BRUISING OR BLEEDING  TENDERNESS IN MOUTH AND THROAT WITH OR WITHOUT PRESENCE OF ULCERS  *URINARY PROBLEMS  *BOWEL PROBLEMS  UNUSUAL RASH Items with * indicate a potential emergency and should be followed up as soon as possible.  Feel free to call the clinic should you have any questions or concerns. The clinic phone number is [phone].  Please show the CHEMO ALERT CARD at check-in to the Emergency Department and triage nurse.   

## 2020-04-24 NOTE — Progress Notes (Signed)
Velcade injection given today per MD orders. Tolerated without adverse affects. Vital signs stable. No complaints at this time. Discharged from clinic ambulatory in stable condition. Alert and oriented x 3. F/U with [facility] as scheduled.   

## 2020-04-24 NOTE — Progress Notes (Signed)
Patient presents for Velcade injection today for.  Vital signs are stable. Patient taking Revlimid as ordered.  No new complaints since last visit.  Labs were drawn on 04/21/20 and were with in parameters for treatment.    Treatment plan, nursing communication advises that no labs are required for this treatment.

## 2020-04-28 ENCOUNTER — Inpatient Hospital Stay (HOSPITAL_COMMUNITY): Payer: Medicaid Other

## 2020-04-28 ENCOUNTER — Other Ambulatory Visit: Payer: Self-pay

## 2020-04-28 ENCOUNTER — Encounter (HOSPITAL_COMMUNITY): Payer: Self-pay

## 2020-04-28 VITALS — BP 109/69 | HR 96 | Temp 97.5°F | Resp 18 | Wt 147.0 lb

## 2020-04-28 DIAGNOSIS — C9000 Multiple myeloma not having achieved remission: Secondary | ICD-10-CM

## 2020-04-28 DIAGNOSIS — Z794 Long term (current) use of insulin: Secondary | ICD-10-CM | POA: Diagnosis not present

## 2020-04-28 DIAGNOSIS — Z5112 Encounter for antineoplastic immunotherapy: Secondary | ICD-10-CM | POA: Diagnosis not present

## 2020-04-28 DIAGNOSIS — E108 Type 1 diabetes mellitus with unspecified complications: Secondary | ICD-10-CM | POA: Diagnosis not present

## 2020-04-28 DIAGNOSIS — Z79899 Other long term (current) drug therapy: Secondary | ICD-10-CM | POA: Diagnosis not present

## 2020-04-28 DIAGNOSIS — I10 Essential (primary) hypertension: Secondary | ICD-10-CM | POA: Diagnosis not present

## 2020-04-28 DIAGNOSIS — E876 Hypokalemia: Secondary | ICD-10-CM | POA: Diagnosis not present

## 2020-04-28 LAB — CBC WITH DIFFERENTIAL/PLATELET
Abs Immature Granulocytes: 0.01 10*3/uL (ref 0.00–0.07)
Basophils Absolute: 0 10*3/uL (ref 0.0–0.1)
Basophils Relative: 1 %
Eosinophils Absolute: 0.2 10*3/uL (ref 0.0–0.5)
Eosinophils Relative: 3 %
HCT: 34.8 % — ABNORMAL LOW (ref 36.0–46.0)
Hemoglobin: 11.3 g/dL — ABNORMAL LOW (ref 12.0–15.0)
Immature Granulocytes: 0 %
Lymphocytes Relative: 15 %
Lymphs Abs: 0.9 10*3/uL (ref 0.7–4.0)
MCH: 30 pg (ref 26.0–34.0)
MCHC: 32.5 g/dL (ref 30.0–36.0)
MCV: 92.3 fL (ref 80.0–100.0)
Monocytes Absolute: 0.4 10*3/uL (ref 0.1–1.0)
Monocytes Relative: 6 %
Neutro Abs: 4.4 10*3/uL (ref 1.7–7.7)
Neutrophils Relative %: 75 %
Platelets: 269 10*3/uL (ref 150–400)
RBC: 3.77 MIL/uL — ABNORMAL LOW (ref 3.87–5.11)
RDW: 15.2 % (ref 11.5–15.5)
WBC: 5.8 10*3/uL (ref 4.0–10.5)
nRBC: 0 % (ref 0.0–0.2)

## 2020-04-28 LAB — COMPREHENSIVE METABOLIC PANEL
ALT: 11 U/L (ref 0–44)
AST: 8 U/L — ABNORMAL LOW (ref 15–41)
Albumin: 3.6 g/dL (ref 3.5–5.0)
Alkaline Phosphatase: 134 U/L — ABNORMAL HIGH (ref 38–126)
Anion gap: 9 (ref 5–15)
BUN: 8 mg/dL (ref 6–20)
CO2: 22 mmol/L (ref 22–32)
Calcium: 8.4 mg/dL — ABNORMAL LOW (ref 8.9–10.3)
Chloride: 103 mmol/L (ref 98–111)
Creatinine, Ser: 0.54 mg/dL (ref 0.44–1.00)
GFR, Estimated: >60 mL/min (ref >60)
Glucose, Bld: 174 mg/dL — ABNORMAL HIGH (ref 70–99)
Potassium: 3.5 mmol/L (ref 3.5–5.1)
Sodium: 134 mmol/L — ABNORMAL LOW (ref 135–145)
Total Bilirubin: 0.7 mg/dL (ref 0.3–1.2)
Total Protein: 6.8 g/dL (ref 6.5–8.1)

## 2020-04-28 MED ORDER — ACETAMINOPHEN 325 MG PO TABS
650.0000 mg | ORAL_TABLET | Freq: Once | ORAL | Status: AC
  Administered 2020-04-28: 09:00:00 650 mg via ORAL
  Filled 2020-04-28: qty 2

## 2020-04-28 MED ORDER — DIPHENHYDRAMINE HCL 25 MG PO CAPS
50.0000 mg | ORAL_CAPSULE | Freq: Once | ORAL | Status: AC
  Administered 2020-04-28: 09:00:00 50 mg via ORAL
  Filled 2020-04-28: qty 2

## 2020-04-28 MED ORDER — DEXAMETHASONE 4 MG PO TABS
20.0000 mg | ORAL_TABLET | Freq: Once | ORAL | Status: AC
  Administered 2020-04-28: 09:00:00 20 mg via ORAL
  Filled 2020-04-28: qty 5

## 2020-04-28 MED ORDER — BORTEZOMIB CHEMO SQ INJECTION 3.5 MG (2.5MG/ML)
1.3000 mg/m2 | Freq: Once | INTRAMUSCULAR | Status: AC
  Administered 2020-04-28: 10:00:00 2.25 mg via SUBCUTANEOUS
  Filled 2020-04-28: qty 0.9

## 2020-04-28 MED ORDER — DARATUMUMAB-HYALURONIDASE-FIHJ 1800-30000 MG-UT/15ML ~~LOC~~ SOLN
1800.0000 mg | Freq: Once | SUBCUTANEOUS | Status: AC
  Administered 2020-04-28: 10:00:00 1800 mg via SUBCUTANEOUS
  Filled 2020-04-28: qty 15

## 2020-04-28 NOTE — Progress Notes (Signed)
Patient tolerating Revlimid with no complaints voiced.  No s/s of distress noted.   Patient tolerated Velcade and Daratumumab injection with no complaints voiced.  Lab work reviewed.  See MAR for details.  Injection sites clean and dry with no bruising or swelling noted.  Band aids applied.  VSS.  Patient left in satisfactory condition with no s/s of distress noted.

## 2020-05-01 ENCOUNTER — Other Ambulatory Visit (HOSPITAL_COMMUNITY): Payer: Medicaid Other

## 2020-05-01 ENCOUNTER — Other Ambulatory Visit: Payer: Self-pay

## 2020-05-01 ENCOUNTER — Inpatient Hospital Stay (HOSPITAL_COMMUNITY): Payer: Medicaid Other

## 2020-05-01 VITALS — BP 120/69 | HR 78 | Temp 97.2°F | Resp 18

## 2020-05-01 DIAGNOSIS — C9000 Multiple myeloma not having achieved remission: Secondary | ICD-10-CM | POA: Diagnosis not present

## 2020-05-01 DIAGNOSIS — Z79899 Other long term (current) drug therapy: Secondary | ICD-10-CM | POA: Diagnosis not present

## 2020-05-01 DIAGNOSIS — E108 Type 1 diabetes mellitus with unspecified complications: Secondary | ICD-10-CM | POA: Diagnosis not present

## 2020-05-01 DIAGNOSIS — I10 Essential (primary) hypertension: Secondary | ICD-10-CM | POA: Diagnosis not present

## 2020-05-01 DIAGNOSIS — Z794 Long term (current) use of insulin: Secondary | ICD-10-CM | POA: Diagnosis not present

## 2020-05-01 DIAGNOSIS — Z5112 Encounter for antineoplastic immunotherapy: Secondary | ICD-10-CM | POA: Diagnosis not present

## 2020-05-01 DIAGNOSIS — E876 Hypokalemia: Secondary | ICD-10-CM | POA: Diagnosis not present

## 2020-05-01 MED ORDER — BORTEZOMIB CHEMO SQ INJECTION 3.5 MG (2.5MG/ML)
1.3000 mg/m2 | Freq: Once | INTRAMUSCULAR | Status: AC
  Administered 2020-05-01: 11:00:00 2.25 mg via SUBCUTANEOUS
  Filled 2020-05-01: qty 0.9

## 2020-05-01 MED ORDER — DEXAMETHASONE 4 MG PO TABS
20.0000 mg | ORAL_TABLET | Freq: Once | ORAL | Status: AC
  Administered 2020-05-01: 11:00:00 20 mg via ORAL
  Filled 2020-05-01: qty 5

## 2020-05-01 NOTE — Progress Notes (Signed)
Patient presents today for Velcade injection.  Vital signs with in parameters for treatment.  Labs on 04/28/20 within parameters for treatment.  Patient has no new complaints since last visit.  Treatment given today per MD orders.  Tolerated injection without adverse affects.  Injection site WNL.  Vital signs stable.  No complaints at this time.  Discharge from clinic ambulatory in stable condition.  Alert and oriented X 3.  Follow up with [facility] as scheduled.

## 2020-05-01 NOTE — Patient Instructions (Signed)
[facility] Discharge Instructions for Patients Receiving Chemotherapy  Today you received the following chemotherapy agents   To help prevent nausea and vomiting after your treatment, we encourage you to take your nausea medication   If you develop nausea and vomiting that is not controlled by your nausea medication, call the clinic.   BELOW ARE SYMPTOMS THAT SHOULD BE REPORTED IMMEDIATELY:  *FEVER GREATER THAN 100.5 F  *CHILLS WITH OR WITHOUT FEVER  NAUSEA AND VOMITING THAT IS NOT CONTROLLED WITH YOUR NAUSEA MEDICATION  *UNUSUAL SHORTNESS OF BREATH  *UNUSUAL BRUISING OR BLEEDING  TENDERNESS IN MOUTH AND THROAT WITH OR WITHOUT PRESENCE OF ULCERS  *URINARY PROBLEMS  *BOWEL PROBLEMS  UNUSUAL RASH Items with * indicate a potential emergency and should be followed up as soon as possible.  Feel free to call the clinic should you have any questions or concerns. The clinic phone number is [phone].  Please show the CHEMO ALERT CARD at check-in to the Emergency Department and triage nurse.   

## 2020-05-05 ENCOUNTER — Other Ambulatory Visit: Payer: Self-pay

## 2020-05-05 ENCOUNTER — Other Ambulatory Visit (HOSPITAL_COMMUNITY): Payer: Self-pay

## 2020-05-05 ENCOUNTER — Encounter (HOSPITAL_COMMUNITY): Payer: Self-pay

## 2020-05-05 ENCOUNTER — Inpatient Hospital Stay (HOSPITAL_COMMUNITY): Payer: Medicaid Other

## 2020-05-05 VITALS — BP 114/74 | HR 73 | Temp 98.3°F | Resp 18 | Wt 151.0 lb

## 2020-05-05 DIAGNOSIS — E108 Type 1 diabetes mellitus with unspecified complications: Secondary | ICD-10-CM | POA: Diagnosis not present

## 2020-05-05 DIAGNOSIS — C9000 Multiple myeloma not having achieved remission: Secondary | ICD-10-CM

## 2020-05-05 DIAGNOSIS — E876 Hypokalemia: Secondary | ICD-10-CM | POA: Diagnosis not present

## 2020-05-05 DIAGNOSIS — Z5112 Encounter for antineoplastic immunotherapy: Secondary | ICD-10-CM | POA: Diagnosis not present

## 2020-05-05 DIAGNOSIS — Z794 Long term (current) use of insulin: Secondary | ICD-10-CM | POA: Diagnosis not present

## 2020-05-05 DIAGNOSIS — Z79899 Other long term (current) drug therapy: Secondary | ICD-10-CM | POA: Diagnosis not present

## 2020-05-05 DIAGNOSIS — I10 Essential (primary) hypertension: Secondary | ICD-10-CM | POA: Diagnosis not present

## 2020-05-05 LAB — COMPREHENSIVE METABOLIC PANEL
ALT: 11 U/L (ref 0–44)
AST: 10 U/L — ABNORMAL LOW (ref 15–41)
Albumin: 3.5 g/dL (ref 3.5–5.0)
Alkaline Phosphatase: 125 U/L (ref 38–126)
Anion gap: 8 (ref 5–15)
BUN: 9 mg/dL (ref 6–20)
CO2: 23 mmol/L (ref 22–32)
Calcium: 8.5 mg/dL — ABNORMAL LOW (ref 8.9–10.3)
Chloride: 103 mmol/L (ref 98–111)
Creatinine, Ser: 0.54 mg/dL (ref 0.44–1.00)
GFR, Estimated: >60 mL/min (ref >60)
Glucose, Bld: 203 mg/dL — ABNORMAL HIGH (ref 70–99)
Potassium: 3.5 mmol/L (ref 3.5–5.1)
Sodium: 134 mmol/L — ABNORMAL LOW (ref 135–145)
Total Bilirubin: 0.4 mg/dL (ref 0.3–1.2)
Total Protein: 6.4 g/dL — ABNORMAL LOW (ref 6.5–8.1)

## 2020-05-05 LAB — CBC WITH DIFFERENTIAL/PLATELET
Abs Immature Granulocytes: 0.03 10*3/uL (ref 0.00–0.07)
Basophils Absolute: 0 10*3/uL (ref 0.0–0.1)
Basophils Relative: 0 %
Eosinophils Absolute: 0.1 10*3/uL (ref 0.0–0.5)
Eosinophils Relative: 3 %
HCT: 34.5 % — ABNORMAL LOW (ref 36.0–46.0)
Hemoglobin: 11.3 g/dL — ABNORMAL LOW (ref 12.0–15.0)
Immature Granulocytes: 1 %
Lymphocytes Relative: 12 %
Lymphs Abs: 0.6 10*3/uL — ABNORMAL LOW (ref 0.7–4.0)
MCH: 30.1 pg (ref 26.0–34.0)
MCHC: 32.8 g/dL (ref 30.0–36.0)
MCV: 92 fL (ref 80.0–100.0)
Monocytes Absolute: 0.9 10*3/uL (ref 0.1–1.0)
Monocytes Relative: 16 %
Neutro Abs: 3.5 10*3/uL (ref 1.7–7.7)
Neutrophils Relative %: 68 %
Platelets: 259 10*3/uL (ref 150–400)
RBC: 3.75 MIL/uL — ABNORMAL LOW (ref 3.87–5.11)
RDW: 14.8 % (ref 11.5–15.5)
WBC: 5.2 10*3/uL (ref 4.0–10.5)
nRBC: 0 % (ref 0.0–0.2)

## 2020-05-05 LAB — PREGNANCY, URINE: Preg Test, Ur: NEGATIVE

## 2020-05-05 MED ORDER — DEXAMETHASONE 4 MG PO TABS
20.0000 mg | ORAL_TABLET | Freq: Once | ORAL | Status: AC
Start: 2020-05-05 — End: 2020-05-05
  Administered 2020-05-05: 10:00:00 20 mg via ORAL
  Filled 2020-05-05: qty 5

## 2020-05-05 MED ORDER — LENALIDOMIDE 20 MG PO CAPS: 20.0000 mg | ORAL_CAPSULE | Freq: Every day | ORAL | 0 refills | Status: DC

## 2020-05-05 MED ORDER — ACETAMINOPHEN 325 MG PO TABS
650.0000 mg | ORAL_TABLET | Freq: Once | ORAL | Status: AC
  Administered 2020-05-05: 10:00:00 650 mg via ORAL
  Filled 2020-05-05: qty 2

## 2020-05-05 MED ORDER — DARATUMUMAB-HYALURONIDASE-FIHJ 1800-30000 MG-UT/15ML ~~LOC~~ SOLN
1800.0000 mg | Freq: Once | SUBCUTANEOUS | Status: AC
  Administered 2020-05-05: 10:00:00 1800 mg via SUBCUTANEOUS
  Filled 2020-05-05: qty 15

## 2020-05-05 MED ORDER — DIPHENHYDRAMINE HCL 25 MG PO CAPS
50.0000 mg | ORAL_CAPSULE | Freq: Once | ORAL | Status: AC
  Administered 2020-05-05: 10:00:00 50 mg via ORAL
  Filled 2020-05-05: qty 2

## 2020-05-05 NOTE — Telephone Encounter (Signed)
Chart reviewed. Revlimid refilled per Dr. Delton Coombes.

## 2020-05-05 NOTE — Progress Notes (Signed)
Diamond Robbins tolerated Darzalex injection well without complaints or incident. Labs reviewed prior to administering this medication.Pt continues to take her Revlimid PO as prescribed without issues. Pt discharged self ambulatory in satisfactory condition

## 2020-05-05 NOTE — Patient Instructions (Signed)
[facility] Discharge Instructions for Patients Receiving Chemotherapy   Beginning January 23rd 2017 lab work for the [facility] will be done in the  Main lab at [facility] on 1st floor. If you have a lab appointment with the [facility] please come in thru the  Main Entrance and check in at the main information desk   Today you received the following chemotherapy agents Darzalex SQ.Follow-up as scheduled  To help prevent nausea and vomiting after your treatment, we encourage you to take your nausea medication   If you develop nausea and vomiting, or diarrhea that is not controlled by your medication, call the clinic.  The clinic phone number is [phone]. Office hours are Monday-Friday 8:30am-5:00pm.  BELOW ARE SYMPTOMS THAT SHOULD BE REPORTED IMMEDIATELY:  *FEVER GREATER THAN 101.0 F  *CHILLS WITH OR WITHOUT FEVER  NAUSEA AND VOMITING THAT IS NOT CONTROLLED WITH YOUR NAUSEA MEDICATION  *UNUSUAL SHORTNESS OF BREATH  *UNUSUAL BRUISING OR BLEEDING  TENDERNESS IN MOUTH AND THROAT WITH OR WITHOUT PRESENCE OF ULCERS  *URINARY PROBLEMS  *BOWEL PROBLEMS  UNUSUAL RASH Items with * indicate a potential emergency and should be followed up as soon as possible. If you have an emergency after office hours please contact your primary care physician or go to the nearest emergency department.  Please call the clinic during office hours if you have any questions or concerns.   You may also contact the Patient Navigator at [phone] should you have any questions or need assistance in obtaining follow up care.      Resources For Cancer Patients and their Caregivers ? American Cancer Society: Can assist with transportation, wigs, general needs, runs Look Good Feel Better.        323-184-5813 ? Cancer Care: Provides financial assistance, online support groups, medication/co-pay assistance.  1-800-813-HOPE 614 744 4234) ? [place] Assists [place] cancer patients and their families through emotional , educational and financial support.  585-791-5257 ? [place] DSS Where to apply for food stamps, Medicaid and utility assistance. (804)677-0992 ? [facility]: Transportation to medical appointments. 720-877-6866 ? Social Security Administration: May apply for disability if have a Stage IV cancer. 8197474614 534-104-0972 ? [employer], Disability and Transit Services: Assists with nutrition, care and transit needs. [phone]

## 2020-05-06 LAB — KAPPA/LAMBDA LIGHT CHAINS
Kappa free light chain: 8.3 mg/L (ref 3.3–19.4)
Kappa, lambda light chain ratio: 1.8 — ABNORMAL HIGH (ref 0.26–1.65)
Lambda free light chains: 4.6 mg/L — ABNORMAL LOW (ref 5.7–26.3)

## 2020-05-06 LAB — PROTEIN ELECTROPHORESIS, SERUM
A/G Ratio: 1.3 (ref 0.7–1.7)
Albumin ELP: 3.5 g/dL (ref 2.9–4.4)
Alpha-1-Globulin: 0.3 g/dL (ref 0.0–0.4)
Alpha-2-Globulin: 0.9 g/dL (ref 0.4–1.0)
Beta Globulin: 1 g/dL (ref 0.7–1.3)
Gamma Globulin: 0.7 g/dL (ref 0.4–1.8)
Globulin, Total: 2.8 g/dL (ref 2.2–3.9)
M-Spike, %: 0.2 g/dL — ABNORMAL HIGH
Total Protein ELP: 6.3 g/dL (ref 6.0–8.5)

## 2020-05-12 ENCOUNTER — Inpatient Hospital Stay (HOSPITAL_COMMUNITY): Payer: Medicaid Other

## 2020-05-12 ENCOUNTER — Other Ambulatory Visit: Payer: Self-pay

## 2020-05-12 ENCOUNTER — Inpatient Hospital Stay (HOSPITAL_BASED_OUTPATIENT_CLINIC_OR_DEPARTMENT_OTHER): Payer: Medicaid Other | Admitting: Hematology

## 2020-05-12 VITALS — BP 111/77 | HR 71 | Temp 97.6°F | Resp 17 | Wt 156.0 lb

## 2020-05-12 DIAGNOSIS — Z794 Long term (current) use of insulin: Secondary | ICD-10-CM | POA: Diagnosis not present

## 2020-05-12 DIAGNOSIS — E108 Type 1 diabetes mellitus with unspecified complications: Secondary | ICD-10-CM | POA: Diagnosis not present

## 2020-05-12 DIAGNOSIS — I10 Essential (primary) hypertension: Secondary | ICD-10-CM | POA: Diagnosis not present

## 2020-05-12 DIAGNOSIS — Z5112 Encounter for antineoplastic immunotherapy: Secondary | ICD-10-CM | POA: Diagnosis not present

## 2020-05-12 DIAGNOSIS — C9000 Multiple myeloma not having achieved remission: Secondary | ICD-10-CM | POA: Diagnosis not present

## 2020-05-12 DIAGNOSIS — E876 Hypokalemia: Secondary | ICD-10-CM | POA: Diagnosis not present

## 2020-05-12 DIAGNOSIS — Z79899 Other long term (current) drug therapy: Secondary | ICD-10-CM | POA: Diagnosis not present

## 2020-05-12 LAB — CBC WITH DIFFERENTIAL/PLATELET
Abs Immature Granulocytes: 0.02 10*3/uL (ref 0.00–0.07)
Basophils Absolute: 0.1 10*3/uL (ref 0.0–0.1)
Basophils Relative: 1 %
Eosinophils Absolute: 0.8 10*3/uL — ABNORMAL HIGH (ref 0.0–0.5)
Eosinophils Relative: 12 %
HCT: 36.2 % (ref 36.0–46.0)
Hemoglobin: 11.5 g/dL — ABNORMAL LOW (ref 12.0–15.0)
Immature Granulocytes: 0 %
Lymphocytes Relative: 14 %
Lymphs Abs: 0.9 10*3/uL (ref 0.7–4.0)
MCH: 30.3 pg (ref 26.0–34.0)
MCHC: 31.8 g/dL (ref 30.0–36.0)
MCV: 95.5 fL (ref 80.0–100.0)
Monocytes Absolute: 0.9 10*3/uL (ref 0.1–1.0)
Monocytes Relative: 14 %
Neutro Abs: 4 10*3/uL (ref 1.7–7.7)
Neutrophils Relative %: 59 %
Platelets: 475 10*3/uL — ABNORMAL HIGH (ref 150–400)
RBC: 3.79 MIL/uL — ABNORMAL LOW (ref 3.87–5.11)
RDW: 14.6 % (ref 11.5–15.5)
WBC: 6.7 10*3/uL (ref 4.0–10.5)
nRBC: 0 % (ref 0.0–0.2)

## 2020-05-12 LAB — COMPREHENSIVE METABOLIC PANEL
ALT: 11 U/L (ref 0–44)
AST: 12 U/L — ABNORMAL LOW (ref 15–41)
Albumin: 3.6 g/dL (ref 3.5–5.0)
Alkaline Phosphatase: 138 U/L — ABNORMAL HIGH (ref 38–126)
Anion gap: 10 (ref 5–15)
BUN: 6 mg/dL (ref 6–20)
CO2: 19 mmol/L — ABNORMAL LOW (ref 22–32)
Calcium: 8.1 mg/dL — ABNORMAL LOW (ref 8.9–10.3)
Chloride: 106 mmol/L (ref 98–111)
Creatinine, Ser: 0.41 mg/dL — ABNORMAL LOW (ref 0.44–1.00)
GFR, Estimated: >60 mL/min (ref >60)
Glucose, Bld: 181 mg/dL — ABNORMAL HIGH (ref 70–99)
Potassium: 3.6 mmol/L (ref 3.5–5.1)
Sodium: 135 mmol/L (ref 135–145)
Total Bilirubin: 0.4 mg/dL (ref 0.3–1.2)
Total Protein: 6.5 g/dL (ref 6.5–8.1)

## 2020-05-12 MED ORDER — BORTEZOMIB CHEMO SQ INJECTION 3.5 MG (2.5MG/ML)
1.3000 mg/m2 | Freq: Once | INTRAMUSCULAR | Status: AC
Start: 2020-05-12 — End: 2020-05-12
  Administered 2020-05-12: 12:00:00 2.25 mg via SUBCUTANEOUS
  Filled 2020-05-12: qty 0.9

## 2020-05-12 MED ORDER — ACETAMINOPHEN 325 MG PO TABS
650.0000 mg | ORAL_TABLET | Freq: Once | ORAL | Status: AC
  Administered 2020-05-12: 11:00:00 650 mg via ORAL
  Filled 2020-05-12: qty 2

## 2020-05-12 MED ORDER — DIPHENHYDRAMINE HCL 25 MG PO CAPS
50.0000 mg | ORAL_CAPSULE | Freq: Once | ORAL | Status: AC
  Administered 2020-05-12: 11:00:00 50 mg via ORAL
  Filled 2020-05-12: qty 2

## 2020-05-12 MED ORDER — DEXAMETHASONE 4 MG PO TABS
20.0000 mg | ORAL_TABLET | Freq: Once | ORAL | Status: AC
  Administered 2020-05-12: 11:00:00 20 mg via ORAL
  Filled 2020-05-12: qty 5

## 2020-05-12 MED ORDER — DARATUMUMAB-HYALURONIDASE-FIHJ 1800-30000 MG-UT/15ML ~~LOC~~ SOLN
1800.0000 mg | Freq: Once | SUBCUTANEOUS | Status: AC
Start: 2020-05-12 — End: 2020-05-12
  Administered 2020-05-12: 12:00:00 1800 mg via SUBCUTANEOUS
  Filled 2020-05-12: qty 15

## 2020-05-12 NOTE — Progress Notes (Signed)
Patient presents today for D1C3 treatment.  Patient stated no problems and feeling well.  No complaints of nausea with Revlimid.  No s/s of distress noted.   Labs reviewed with oncology visit and ok to treat today verbal order Dr. Ellin Saba.   Patient stated after oncology visit she had a "fine" rash to appear on bilateral arms that itches prn.  No new detergents or soaps.  Reviewed with Dr. Ellin Saba with verbal order ok to proceed with treatment.   Patient tolerated Velcade and Daratumumab injection with no complaints voiced.  Lab work reviewed.  See MAR for details.  Injection sites clean and dry with no bruising or swelling noted.  Band aids applied.  VSS.  Patient left in satisfactory condition with no s/s of distress noted.

## 2020-05-12 NOTE — Patient Instructions (Signed)
[facility] at [facility] Discharge Instructions  You were seen today by Dr. Ellin Saba. He went over your recent results. You received your treatment today; continue getting your scheduled treatments. Purchase calcium over the counter and taking 600 mg daily. You will be referred to Dr. Dimas Aguas in [facility] for your transplant planning. Dr. Ellin Saba will see you back in 4 weeks for labs and follow up.   Thank you for choosing [facility] at [facility] to provide your oncology and hematology care.  To afford each patient quality time with our provider, please arrive at least 15 minutes before your scheduled appointment time.   If you have a lab appointment with the Cancer Center please come in thru the Main Entrance and check in at the main information desk  You need to re-schedule your appointment should you arrive 10 or more minutes late.  We strive to give you quality time with our providers, and arriving late affects you and other patients whose appointments are after yours.  Also, if you no show three or more times for appointments you may be dismissed from the clinic at the providers discretion.     Again, thank you for choosing [facility].  Our hope is that these requests will decrease the amount of time that you wait before being seen by our physicians.       _____________________________________________________________  Should you have questions after your visit to [facility], please contact our office at [phone] between the hours of 8:00 a.m. and 4:30 p.m.  Voicemails left after 4:00 p.m. will not be returned until the following business day.  For prescription refill requests, have your pharmacy contact our office and allow 72 hours.    Cancer Center Support Programs:   > Cancer Support Group  2nd Tuesday of the month 1pm-2pm, Journey Room

## 2020-05-12 NOTE — Progress Notes (Signed)
Diamond Robbins, [place] [address]   CLINIC:  Medical Oncology/Hematology  PCP:  Patient, No Pcp Per None None   REASON FOR VISIT:  Follow-up for IgA kappa multiple myeloma  PRIOR THERAPY: None  NGS Results: Not done  CURRENT THERAPY: RVD and Darzalex Faspro q 3 days for 3 weeks; Revlimid 2 weeks on, 1 week off  BRIEF ONCOLOGIC HISTORY:  Oncology History  Multiple myeloma ([place])  03/09/2020 Initial Diagnosis   Multiple myeloma ([place])   03/31/2020 -  Chemotherapy   The patient had dexamethasone (DECADRON) tablet 20 mg, 20 mg, Oral,  Once, 2 of 8 cycles Administration: 20 mg (04/03/2020), 20 mg (04/24/2020), 20 mg (05/01/2020) dexamethasone (DECADRON) 4 MG tablet, 3 of 13 cycles Administration: 20 mg (04/07/2020), 20 mg (04/14/2020), 20 mg (04/21/2020), 20 mg (04/28/2020), 20 mg (05/05/2020) daratumumab-hyaluronidase-fihj (DARZALEX FASPRO) 1800-30000 MG-UT/15ML chemo SQ injection 1,800 mg, 1,800 mg, Subcutaneous,  Once, 2 of 12 cycles Administration: 1,800 mg (03/31/2020), 1,800 mg (04/07/2020), 1,800 mg (04/14/2020), 1,800 mg (04/21/2020), 1,800 mg (04/28/2020), 1,800 mg (05/05/2020) bortezomib SQ (VELCADE) chemo injection (2.17m/mL concentration) 2.25 mg, 1.3 mg/m2 = 2.25 mg, Subcutaneous,  Once, 2 of 8 cycles Administration: 2.25 mg (03/31/2020), 2.25 mg (04/03/2020), 2.25 mg (04/07/2020), 2.25 mg (04/21/2020), 2.25 mg (04/24/2020), 2.25 mg (04/28/2020), 2.25 mg (05/01/2020)  for chemotherapy treatment.      CANCER STAGING: Cancer Staging No matching staging information was found for the patient.  INTERVAL HISTORY:  Diamond Robbins a 29y.o. female, returns for routine follow-up and consideration for next cycle of chemotherapy. [place] last seen on 04/14/2020.  Due for cycle #3 of Velcade and Darzalex Faspro today.   Overall, Diamond Robbins tells me Diamond Robbins has been feeling pretty well. Diamond Robbins tolerated the previous treatment well. Diamond Robbins will start  taking the Revlimid today and has 14 tablets. Diamond Robbins is checking her BG at home daily and notes that the steroid raises her BG. Diamond Robbins denies having any cough, SOB, leg swelling or rashes. Diamond Robbins denies having any dental issues or carries.  Overall, Diamond Robbins feels ready for next cycle of chemo today.    REVIEW OF SYSTEMS:  Review of Systems  Constitutional: Positive for fatigue (75%). Negative for appetite change.  Respiratory: Negative for cough and shortness of breath.   Cardiovascular: Negative for leg swelling.  Skin: Negative for rash.  All other systems reviewed and are negative.   PAST MEDICAL/SURGICAL HISTORY:  Past Medical History:  Diagnosis Date  . [place]23[place]  . Hypertension   . Type 1 diabetes mellitus ([place] 02/23/2020   Past Surgical History:  Procedure Laterality Date  . NO PAST SURGERIES      SOCIAL HISTORY:  Social History   Socioeconomic History  . Marital status: Single    Spouse name: Not on file  . Number of children: Not on file  . Years of education: Not on file  . Highest education level: Not on file  Occupational History  . Occupation: unemployed  Tobacco Use  . Smoking status: Never Smoker  . Smokeless tobacco: Never Used  Vaping Use  . Vaping Use: Never used  Substance and Sexual Activity  . Alcohol use: Not Currently    Comment: occ  . Drug use: No  . Sexual activity: Yes    Birth control/protection: Condom  Other Topics Concern  . Not on file  Social History Narrative   Pt is engaged   Social Determinants of HEngineer, drilling  Resource Strain: Low Risk   . Difficulty of Paying Living Expenses: Not hard at all  Food Insecurity: No Food Insecurity  . Worried About Charity fundraiser in the Last Year: Never true  . Ran Out of Food in the Last Year: Never true  Transportation Needs: No Transportation Needs  . Lack of Transportation (Medical): No  . Lack of Transportation (Non-Medical): No  Physical Activity:  Insufficiently Active  . Days of Exercise per Week: 3 days  . Minutes of Exercise per Session: 10 min  Stress: No Stress Concern Present  . Feeling of Stress : Not at all  Social Connections: Unknown  . Frequency of Communication with Friends and Family: More than three times a week  . Frequency of Social Gatherings with Friends and Family: More than three times a week  . Attends Religious Services: More than 4 times per year  . Active Member of Clubs or Organizations: No  . Attends Archivist Meetings: More than 4 times per year  . Marital Status: Not on file  Intimate Partner Violence: Not At Risk  . Fear of Current or Ex-Partner: No  . Emotionally Abused: No  . Physically Abused: No  . Sexually Abused: No    FAMILY HISTORY:  Family History  Problem Relation Age of Onset  . Hypertension Mother   . Diabetes Mother   . Hypertension Father   . Diabetes Maternal Grandmother     CURRENT MEDICATIONS:  Current Outpatient Medications  Medication Sig Dispense Refill  . ACCU-CHEK GUIDE test strip     . Accu-Chek Softclix Lancets lancets 1 each 3 (three) times daily.    Marland Kitchen acyclovir (ZOVIRAX) 400 MG tablet Take 1 tablet (400 mg total) by mouth 2 (two) times daily. 60 tablet 6  . amLODipine (NORVASC) 10 MG tablet Take 10 mg by mouth daily.    Marland Kitchen aspirin EC 81 MG tablet Take 1 tablet (81 mg total) by mouth daily. Swallow whole. 30 tablet 11  . Blood Glucose Monitoring Suppl (ACCU-CHEK GUIDE) w/Device KIT     . bortezomib IV (VELCADE) 3.5 MG injection Inject 1.3 mg/m2 into the vein once.    . Daratumumab-Hyaluronidase-fihj (DARZALEX FASPRO [place]) Inject 1,800 mg into the skin.    Marland Kitchen LANTUS SOLOSTAR 100 UNIT/ML Solostar Pen Inject 25 Units into the skin at bedtime.     Marland Kitchen lenalidomide (REVLIMID) 20 MG capsule Take 1 capsule (20 mg total) by mouth daily. 14 capsule 0  . metoprolol tartrate (LOPRESSOR) 25 MG tablet Take 25 mg by mouth daily.    . potassium chloride SA (KLOR-CON) 20 MEQ  tablet Take 1 tablet (20 mEq total) by mouth daily. 30 tablet 0   No current facility-administered medications for this visit.    ALLERGIES:  No Known Allergies  PHYSICAL EXAM:  Performance status (ECOG): 1 - Symptomatic but completely ambulatory  Vitals:   05/12/20 0908  BP: 111/77  Pulse: 71  Resp: 17  Temp: 97.6 F (36.4 C)  SpO2: 100%   Wt Readings from Last 3 Encounters:  05/12/20 156 lb (70.8 kg)  05/05/20 151 lb (68.5 kg)  04/28/20 147 lb (66.7 kg)   Physical Exam Vitals reviewed.  Constitutional:      Appearance: Normal appearance.  Cardiovascular:     Rate and Rhythm: Normal rate and regular rhythm.     Pulses: Normal pulses.     Heart sounds: Normal heart sounds.  Pulmonary:     Effort: Pulmonary effort is normal.  Breath sounds: Normal breath sounds.  Musculoskeletal:     Right lower leg: No edema.     Left lower leg: No edema.  Neurological:     General: No focal deficit present.     Mental Status: Diamond Robbins is alert and oriented to person, place, and time.  Psychiatric:        Mood and Affect: Mood normal.        Behavior: Behavior normal.     LABORATORY DATA:  I have reviewed the labs as listed.  CBC Latest Ref Rng & Units 05/12/2020 05/05/2020 04/28/2020  WBC 4.0 - 10.5 K/uL 6.7 5.2 5.8  Hemoglobin 12.0 - 15.0 g/dL 11.5(L) 11.3(L) 11.3(L)  Hematocrit 36.0 - 46.0 % 36.2 34.5(L) 34.8(L)  Platelets 150 - 400 K/uL 475(H) 259 269   CMP Latest Ref Rng & Units 05/12/2020 05/05/2020 04/28/2020  Glucose 70 - 99 mg/dL 181(H) 203(H) 174(H)  BUN 6 - 20 mg/dL _0 Creatinine 0.44 - 1.00 mg/dL 0.41(L) 0.54 0.54  Sodium 135 - 145 mmol/L 135 134(L) 134(L)  Potassium 3.5 - 5.1 mmol/L 3.6 3.5 3.5  Chloride 98 - 111 mmol/L 106 103 103  CO2 22 - 32 mmol/L 19(L) 23 22  Calcium 8.9 - 10.3 mg/dL 8.1(L) 8.5(L) 8.4(L)  Total Protein 6.5 - 8.1 g/dL 6.5 6.4(L) 6.8  Total Bilirubin 0.3 - 1.2 mg/dL 0.4 0.4 0.7  Alkaline Phos 38 - 126 U/L 138(H) 125 134(H)  AST 15  - 41 U/L 12(L) 10(L) 8(L)  ALT 0 - 44 U/L _1 DIAGNOSTIC IMAGING:  I have independently reviewed the scans and discussed with the patient. No results found.   ASSESSMENT:  1.  Stage IIIIgA kappa multiple myeloma: -Presentation to [facility] in [place] with confusion and weakness. -Found to have diabetic ketoacidosis with newly diagnosed type 1 diabetes. -Also found to have acute renal failureand hypercalcemia. -Bone marrow biopsy on 02/25/2020 with flow cytometry with the population CD138 positive, CD56 positive IgA kappa monoclonal plasma cells 12%. 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 -Ultrasound abdomen on 02/23/2020 showed increased hepatic echogenicity with normal spleen. -Renal ultrasound on 02/24/2020 was normal. -CT chest on 02/22/2020 withmoth eatenappearance of the bones. -Beta-2 microglobulin 2.1. LDH 322. SPEP and immunofixation were normal. Kappa light chains are elevated at 36.8. Lambda light chains 12.9 and ratio of 2.85. -24-hour urine immunofixation was negative. M spike was negative. -PET scan on 03/23/2020 showed no FDG radiotracer activity, no soft tissue plasmacytoma. Diffuse multiple small lytic lesions within the pelvis, spine and vertebral bodies consistent with myeloma. -Chromosome analysis was 91, XX. Multiple myeloma FISH panel was normal. -Diamond Robbins is considered high risk based on elevated LDH level. -Cycle 1 of Velcade, Revlimid, Darzalex and dexamethasone started on 03/31/2020.  2. 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 Non-smoker. -No family history of myeloma or other malignancies.   PLAN:  1.Stage IIIIgA kappa multiple myeloma: -Diamond Robbins is tolerating current regimen very well. -Reviewed myeloma labs from 05/05/2020.  Kappa light chains improved to 8.3.   Light chain ratio also improved to 1.8.  M spike increased to 0.2, likely from Darzalex. -Recommend continuing with cycle 3 today. -Recommend consultation with Dr. Junius Finner at [facility] for autologous stem cell transplant. -RTC 4 weeks for follow-up with repeat myeloma labs.  2. Type I diabetes: -Continue Lantus 25 units at bedtime.  3. Hypertension: -Continue Norvasc 10 mg daily and metoprolol 25 mg daily.  4.  Hypokalemia: -Diamond Robbins took potassium for a month and stopped it.  Potassium today is 3.6.   Orders placed this encounter:  No orders of the defined types were placed in this encounter.    [name], MD [place] [phone]   I, Milinda Antis, am acting as a scribe for Dr. Sanda Linger.  I, [name] MD, have reviewed the above documentation for accuracy and completeness, and I agree with the above.

## 2020-05-13 ENCOUNTER — Encounter (HOSPITAL_COMMUNITY): Payer: Self-pay | Admitting: Lab

## 2020-05-13 NOTE — Progress Notes (Unsigned)
Referral sent [facility] Dr Dimas Aguas.  Spoke with Renee at [place] and they will contact pt for appt

## 2020-05-16 DIAGNOSIS — Z419 Encounter for procedure for purposes other than remedying health state, unspecified: Secondary | ICD-10-CM | POA: Diagnosis not present

## 2020-05-19 ENCOUNTER — Other Ambulatory Visit: Payer: Self-pay

## 2020-05-19 ENCOUNTER — Inpatient Hospital Stay (HOSPITAL_COMMUNITY): Payer: Medicaid Other | Attending: Hematology

## 2020-05-19 ENCOUNTER — Inpatient Hospital Stay (HOSPITAL_COMMUNITY): Payer: Medicaid Other

## 2020-05-19 VITALS — BP 122/85 | HR 78 | Temp 97.3°F | Resp 18 | Wt 154.4 lb

## 2020-05-19 DIAGNOSIS — E876 Hypokalemia: Secondary | ICD-10-CM | POA: Insufficient documentation

## 2020-05-19 DIAGNOSIS — Z5112 Encounter for antineoplastic immunotherapy: Secondary | ICD-10-CM | POA: Insufficient documentation

## 2020-05-19 DIAGNOSIS — I10 Essential (primary) hypertension: Secondary | ICD-10-CM | POA: Insufficient documentation

## 2020-05-19 DIAGNOSIS — E109 Type 1 diabetes mellitus without complications: Secondary | ICD-10-CM | POA: Insufficient documentation

## 2020-05-19 DIAGNOSIS — C9000 Multiple myeloma not having achieved remission: Secondary | ICD-10-CM | POA: Insufficient documentation

## 2020-05-19 LAB — CBC WITH DIFFERENTIAL/PLATELET
Abs Immature Granulocytes: 0.03 10*3/uL (ref 0.00–0.07)
Basophils Absolute: 0.1 10*3/uL (ref 0.0–0.1)
Basophils Relative: 1 %
Eosinophils Absolute: 0.6 10*3/uL — ABNORMAL HIGH (ref 0.0–0.5)
Eosinophils Relative: 11 %
HCT: 37.5 % (ref 36.0–46.0)
Hemoglobin: 12.4 g/dL (ref 12.0–15.0)
Immature Granulocytes: 1 %
Lymphocytes Relative: 14 %
Lymphs Abs: 0.7 10*3/uL (ref 0.7–4.0)
MCH: 30.6 pg (ref 26.0–34.0)
MCHC: 33.1 g/dL (ref 30.0–36.0)
MCV: 92.6 fL (ref 80.0–100.0)
Monocytes Absolute: 0.4 10*3/uL (ref 0.1–1.0)
Monocytes Relative: 7 %
Neutro Abs: 3.5 10*3/uL (ref 1.7–7.7)
Neutrophils Relative %: 66 %
Platelets: 308 10*3/uL (ref 150–400)
RBC: 4.05 MIL/uL (ref 3.87–5.11)
RDW: 14 % (ref 11.5–15.5)
WBC: 5.3 10*3/uL (ref 4.0–10.5)
nRBC: 0 % (ref 0.0–0.2)

## 2020-05-19 LAB — COMPREHENSIVE METABOLIC PANEL
ALT: 13 U/L (ref 0–44)
AST: 12 U/L — ABNORMAL LOW (ref 15–41)
Albumin: 3.7 g/dL (ref 3.5–5.0)
Alkaline Phosphatase: 169 U/L — ABNORMAL HIGH (ref 38–126)
Anion gap: 9 (ref 5–15)
BUN: 8 mg/dL (ref 6–20)
CO2: 22 mmol/L (ref 22–32)
Calcium: 8.6 mg/dL — ABNORMAL LOW (ref 8.9–10.3)
Chloride: 104 mmol/L (ref 98–111)
Creatinine, Ser: 0.46 mg/dL (ref 0.44–1.00)
GFR, Estimated: >60 mL/min (ref >60)
Glucose, Bld: 246 mg/dL — ABNORMAL HIGH (ref 70–99)
Potassium: 3.5 mmol/L (ref 3.5–5.1)
Sodium: 135 mmol/L (ref 135–145)
Total Bilirubin: 0.4 mg/dL (ref 0.3–1.2)
Total Protein: 6.9 g/dL (ref 6.5–8.1)

## 2020-05-19 MED ORDER — DIPHENHYDRAMINE HCL 25 MG PO CAPS
50.0000 mg | ORAL_CAPSULE | Freq: Once | ORAL | Status: AC
  Administered 2020-05-19: 50 mg via ORAL
  Filled 2020-05-19: qty 2

## 2020-05-19 MED ORDER — BORTEZOMIB CHEMO SQ INJECTION 3.5 MG (2.5MG/ML)
1.3000 mg/m2 | Freq: Once | INTRAMUSCULAR | Status: AC
  Administered 2020-05-19: 2.25 mg via SUBCUTANEOUS
  Filled 2020-05-19: qty 0.9

## 2020-05-19 MED ORDER — DEXAMETHASONE 4 MG PO TABS
20.0000 mg | ORAL_TABLET | Freq: Once | ORAL | Status: AC
  Administered 2020-05-19: 20 mg via ORAL
  Filled 2020-05-19: qty 5

## 2020-05-19 MED ORDER — DARATUMUMAB-HYALURONIDASE-FIHJ 1800-30000 MG-UT/15ML ~~LOC~~ SOLN
1800.0000 mg | Freq: Once | SUBCUTANEOUS | Status: AC
  Administered 2020-05-19: 1800 mg via SUBCUTANEOUS
  Filled 2020-05-19: qty 15

## 2020-05-19 MED ORDER — ACETAMINOPHEN 325 MG PO TABS
650.0000 mg | ORAL_TABLET | Freq: Once | ORAL | Status: AC
  Administered 2020-05-19: 650 mg via ORAL
  Filled 2020-05-19: qty 2

## 2020-05-19 NOTE — Progress Notes (Signed)
Dara and Velcade injections given today per MD orders.  Tolerated injections without adverse affects.  Injection sites WNL.  Vital signs stable.  No complaints at this time.  Discharge from clinic ambulatory in stable condition.  Alert and oriented X 3.  Follow up with [facility] as scheduled.

## 2020-05-19 NOTE — Patient Instructions (Signed)
[facility] Discharge Instructions for Patients Receiving Chemotherapy  Today you received the following chemotherapy agents   To help prevent nausea and vomiting after your treatment, we encourage you to take your nausea medication   If you develop nausea and vomiting that is not controlled by your nausea medication, call the clinic.   BELOW ARE SYMPTOMS THAT SHOULD BE REPORTED IMMEDIATELY:  *FEVER GREATER THAN 100.5 F  *CHILLS WITH OR WITHOUT FEVER  NAUSEA AND VOMITING THAT IS NOT CONTROLLED WITH YOUR NAUSEA MEDICATION  *UNUSUAL SHORTNESS OF BREATH  *UNUSUAL BRUISING OR BLEEDING  TENDERNESS IN MOUTH AND THROAT WITH OR WITHOUT PRESENCE OF ULCERS  *URINARY PROBLEMS  *BOWEL PROBLEMS  UNUSUAL RASH Items with * indicate a potential emergency and should be followed up as soon as possible.  Feel free to call the clinic should you have any questions or concerns. The clinic phone number is [phone].  Please show the CHEMO ALERT CARD at check-in to the Emergency Department and triage nurse.   

## 2020-05-19 NOTE — Progress Notes (Signed)
Patient presents today for Dara injection and Velcade injection.  Vital signs within parameters for treatment.  Labs reviewed and within parameters for treatment.  No new complaints since last visit.

## 2020-05-22 ENCOUNTER — Other Ambulatory Visit: Payer: Self-pay

## 2020-05-22 ENCOUNTER — Inpatient Hospital Stay (HOSPITAL_COMMUNITY): Payer: Medicaid Other

## 2020-05-22 ENCOUNTER — Encounter (HOSPITAL_COMMUNITY): Payer: Self-pay

## 2020-05-22 VITALS — BP 129/89 | HR 81 | Temp 97.0°F | Resp 18 | Wt 155.4 lb

## 2020-05-22 DIAGNOSIS — Z5112 Encounter for antineoplastic immunotherapy: Secondary | ICD-10-CM | POA: Diagnosis not present

## 2020-05-22 DIAGNOSIS — C9000 Multiple myeloma not having achieved remission: Secondary | ICD-10-CM | POA: Diagnosis not present

## 2020-05-22 DIAGNOSIS — I10 Essential (primary) hypertension: Secondary | ICD-10-CM | POA: Diagnosis not present

## 2020-05-22 DIAGNOSIS — E876 Hypokalemia: Secondary | ICD-10-CM | POA: Diagnosis not present

## 2020-05-22 DIAGNOSIS — E109 Type 1 diabetes mellitus without complications: Secondary | ICD-10-CM | POA: Diagnosis not present

## 2020-05-22 MED ORDER — BORTEZOMIB CHEMO SQ INJECTION 3.5 MG (2.5MG/ML)
1.3000 mg/m2 | Freq: Once | INTRAMUSCULAR | Status: AC
  Administered 2020-05-22: 2.25 mg via SUBCUTANEOUS
  Filled 2020-05-22: qty 0.9

## 2020-05-22 MED ORDER — DEXAMETHASONE 4 MG PO TABS
20.0000 mg | ORAL_TABLET | Freq: Once | ORAL | Status: AC
  Administered 2020-05-22: 20 mg via ORAL
  Filled 2020-05-22: qty 5

## 2020-05-22 NOTE — Progress Notes (Signed)
Patient tolerated Velcade injection with no complaints voiced.  Lab work reviewed.  See MAR for details.  Injection site clean and dry with no bruising or swelling noted.  Band aid applied.  VSS.  Patient left in satisfactory condition with no s/s of distress noted.  

## 2020-05-26 ENCOUNTER — Inpatient Hospital Stay (HOSPITAL_COMMUNITY): Payer: Medicaid Other

## 2020-05-26 ENCOUNTER — Encounter (HOSPITAL_COMMUNITY): Payer: Self-pay

## 2020-05-26 ENCOUNTER — Other Ambulatory Visit: Payer: Self-pay

## 2020-05-26 VITALS — BP 113/77 | HR 76 | Temp 97.2°F | Resp 18

## 2020-05-26 DIAGNOSIS — C9000 Multiple myeloma not having achieved remission: Secondary | ICD-10-CM

## 2020-05-26 DIAGNOSIS — I10 Essential (primary) hypertension: Secondary | ICD-10-CM | POA: Diagnosis not present

## 2020-05-26 DIAGNOSIS — E109 Type 1 diabetes mellitus without complications: Secondary | ICD-10-CM | POA: Diagnosis not present

## 2020-05-26 DIAGNOSIS — E876 Hypokalemia: Secondary | ICD-10-CM | POA: Diagnosis not present

## 2020-05-26 DIAGNOSIS — Z5112 Encounter for antineoplastic immunotherapy: Secondary | ICD-10-CM | POA: Diagnosis not present

## 2020-05-26 LAB — CBC WITH DIFFERENTIAL/PLATELET
Abs Immature Granulocytes: 0.05 10*3/uL (ref 0.00–0.07)
Basophils Absolute: 0.1 10*3/uL (ref 0.0–0.1)
Basophils Relative: 1 %
Eosinophils Absolute: 0.4 10*3/uL (ref 0.0–0.5)
Eosinophils Relative: 7 %
HCT: 42.4 % (ref 36.0–46.0)
Hemoglobin: 13.9 g/dL (ref 12.0–15.0)
Immature Granulocytes: 1 %
Lymphocytes Relative: 18 %
Lymphs Abs: 1 10*3/uL (ref 0.7–4.0)
MCH: 30.3 pg (ref 26.0–34.0)
MCHC: 32.8 g/dL (ref 30.0–36.0)
MCV: 92.6 fL (ref 80.0–100.0)
Monocytes Absolute: 0.9 10*3/uL (ref 0.1–1.0)
Monocytes Relative: 17 %
Neutro Abs: 3.1 10*3/uL (ref 1.7–7.7)
Neutrophils Relative %: 56 %
Platelets: 227 10*3/uL (ref 150–400)
RBC: 4.58 MIL/uL (ref 3.87–5.11)
RDW: 13.6 % (ref 11.5–15.5)
WBC: 5.5 10*3/uL (ref 4.0–10.5)
nRBC: 0 % (ref 0.0–0.2)

## 2020-05-26 LAB — COMPREHENSIVE METABOLIC PANEL
ALT: 15 U/L (ref 0–44)
AST: 13 U/L — ABNORMAL LOW (ref 15–41)
Albumin: 4.1 g/dL (ref 3.5–5.0)
Alkaline Phosphatase: 199 U/L — ABNORMAL HIGH (ref 38–126)
Anion gap: 11 (ref 5–15)
BUN: 12 mg/dL (ref 6–20)
CO2: 23 mmol/L (ref 22–32)
Calcium: 8.7 mg/dL — ABNORMAL LOW (ref 8.9–10.3)
Chloride: 102 mmol/L (ref 98–111)
Creatinine, Ser: 0.51 mg/dL (ref 0.44–1.00)
GFR, Estimated: >60 mL/min (ref >60)
Glucose, Bld: 231 mg/dL — ABNORMAL HIGH (ref 70–99)
Potassium: 3.3 mmol/L — ABNORMAL LOW (ref 3.5–5.1)
Sodium: 136 mmol/L (ref 135–145)
Total Bilirubin: 0.6 mg/dL (ref 0.3–1.2)
Total Protein: 7.1 g/dL (ref 6.5–8.1)

## 2020-05-26 LAB — PREGNANCY, URINE: Preg Test, Ur: NEGATIVE

## 2020-05-26 MED ORDER — ACETAMINOPHEN 325 MG PO TABS
ORAL_TABLET | ORAL | Status: AC
  Filled 2020-05-26: qty 2

## 2020-05-26 MED ORDER — DARATUMUMAB-HYALURONIDASE-FIHJ 1800-30000 MG-UT/15ML ~~LOC~~ SOLN
1800.0000 mg | Freq: Once | SUBCUTANEOUS | Status: AC
  Administered 2020-05-26: 1800 mg via SUBCUTANEOUS
  Filled 2020-05-26: qty 15

## 2020-05-26 MED ORDER — DIPHENHYDRAMINE HCL 25 MG PO CAPS
50.0000 mg | ORAL_CAPSULE | Freq: Once | ORAL | Status: AC
  Administered 2020-05-26: 50 mg via ORAL

## 2020-05-26 MED ORDER — DEXAMETHASONE 4 MG PO TABS
20.0000 mg | ORAL_TABLET | Freq: Once | ORAL | Status: AC
  Administered 2020-05-26: 20 mg via ORAL

## 2020-05-26 MED ORDER — ACETAMINOPHEN 325 MG PO TABS
650.0000 mg | ORAL_TABLET | Freq: Once | ORAL | Status: AC
  Administered 2020-05-26: 650 mg via ORAL

## 2020-05-26 MED ORDER — DIPHENHYDRAMINE HCL 25 MG PO CAPS
ORAL_CAPSULE | ORAL | Status: AC
  Filled 2020-05-26: qty 2

## 2020-05-26 MED ORDER — DEXAMETHASONE 4 MG PO TABS
ORAL_TABLET | ORAL | Status: AC
  Filled 2020-05-26: qty 5

## 2020-05-26 NOTE — Patient Instructions (Signed)
[facility] Discharge Instructions for Patients Receiving Chemotherapy  Today you received the following chemotherapy agents   To help prevent nausea and vomiting after your treatment, we encourage you to take your nausea medication   If you develop nausea and vomiting that is not controlled by your nausea medication, call the clinic.   BELOW ARE SYMPTOMS THAT SHOULD BE REPORTED IMMEDIATELY:  *FEVER GREATER THAN 100.5 F  *CHILLS WITH OR WITHOUT FEVER  NAUSEA AND VOMITING THAT IS NOT CONTROLLED WITH YOUR NAUSEA MEDICATION  *UNUSUAL SHORTNESS OF BREATH  *UNUSUAL BRUISING OR BLEEDING  TENDERNESS IN MOUTH AND THROAT WITH OR WITHOUT PRESENCE OF ULCERS  *URINARY PROBLEMS  *BOWEL PROBLEMS  UNUSUAL RASH Items with * indicate a potential emergency and should be followed up as soon as possible.  Feel free to call the clinic should you have any questions or concerns. The clinic phone number is [phone].  Please show the CHEMO ALERT CARD at check-in to the Emergency Department and triage nurse.   

## 2020-05-26 NOTE — Progress Notes (Signed)
Pt here for D15C3 of [place] DARA.  ALK PHOS 199 and potassium 3.3.  Verbal order from Dr Raliegh Ip okay for treatment today.  Tolerated treatment today without incidence.  Discharged in stable condition ambulatory.

## 2020-05-27 LAB — PROTEIN ELECTROPHORESIS, SERUM
A/G Ratio: 1.5 (ref 0.7–1.7)
Albumin ELP: 4.2 g/dL (ref 2.9–4.4)
Alpha-1-Globulin: 0.2 g/dL (ref 0.0–0.4)
Alpha-2-Globulin: 0.9 g/dL (ref 0.4–1.0)
Beta Globulin: 1.1 g/dL (ref 0.7–1.3)
Gamma Globulin: 0.6 g/dL (ref 0.4–1.8)
Globulin, Total: 2.8 g/dL (ref 2.2–3.9)
M-Spike, %: 0.2 g/dL — ABNORMAL HIGH
Total Protein ELP: 7 g/dL (ref 6.0–8.5)

## 2020-05-27 LAB — KAPPA/LAMBDA LIGHT CHAINS
Kappa free light chain: 7.5 mg/L (ref 3.3–19.4)
Kappa, lambda light chain ratio: 1.97 — ABNORMAL HIGH (ref 0.26–1.65)
Lambda free light chains: 3.8 mg/L — ABNORMAL LOW (ref 5.7–26.3)

## 2020-05-28 ENCOUNTER — Other Ambulatory Visit (HOSPITAL_COMMUNITY): Payer: Self-pay

## 2020-05-28 MED ORDER — LENALIDOMIDE 20 MG PO CAPS: 20.0000 mg | ORAL_CAPSULE | Freq: Every day | ORAL | 0 refills | Status: DC

## 2020-05-28 NOTE — Telephone Encounter (Signed)
Chart reviewed. Revlimid refilled per Dr. Delton Coombes

## 2020-05-29 DIAGNOSIS — C9000 Multiple myeloma not having achieved remission: Secondary | ICD-10-CM | POA: Diagnosis not present

## 2020-06-02 ENCOUNTER — Inpatient Hospital Stay (HOSPITAL_COMMUNITY): Payer: Medicaid Other

## 2020-06-02 ENCOUNTER — Ambulatory Visit (HOSPITAL_COMMUNITY): Payer: Medicaid Other

## 2020-06-02 ENCOUNTER — Other Ambulatory Visit: Payer: Self-pay

## 2020-06-02 VITALS — BP 121/83 | HR 81 | Temp 97.2°F | Resp 18 | Wt 158.8 lb

## 2020-06-02 DIAGNOSIS — C9000 Multiple myeloma not having achieved remission: Secondary | ICD-10-CM

## 2020-06-02 DIAGNOSIS — E109 Type 1 diabetes mellitus without complications: Secondary | ICD-10-CM | POA: Diagnosis not present

## 2020-06-02 DIAGNOSIS — Z5112 Encounter for antineoplastic immunotherapy: Secondary | ICD-10-CM | POA: Diagnosis not present

## 2020-06-02 DIAGNOSIS — I10 Essential (primary) hypertension: Secondary | ICD-10-CM | POA: Diagnosis not present

## 2020-06-02 DIAGNOSIS — E876 Hypokalemia: Secondary | ICD-10-CM | POA: Diagnosis not present

## 2020-06-02 LAB — CBC WITH DIFFERENTIAL/PLATELET
Abs Immature Granulocytes: 0.02 10*3/uL (ref 0.00–0.07)
Basophils Absolute: 0.1 10*3/uL (ref 0.0–0.1)
Basophils Relative: 1 %
Eosinophils Absolute: 0.6 10*3/uL — ABNORMAL HIGH (ref 0.0–0.5)
Eosinophils Relative: 10 %
HCT: 41 % (ref 36.0–46.0)
Hemoglobin: 13.2 g/dL (ref 12.0–15.0)
Immature Granulocytes: 0 %
Lymphocytes Relative: 20 %
Lymphs Abs: 1.2 10*3/uL (ref 0.7–4.0)
MCH: 30 pg (ref 26.0–34.0)
MCHC: 32.2 g/dL (ref 30.0–36.0)
MCV: 93.2 fL (ref 80.0–100.0)
Monocytes Absolute: 1 10*3/uL (ref 0.1–1.0)
Monocytes Relative: 16 %
Neutro Abs: 3.3 10*3/uL (ref 1.7–7.7)
Neutrophils Relative %: 53 %
Platelets: 383 10*3/uL (ref 150–400)
RBC: 4.4 MIL/uL (ref 3.87–5.11)
RDW: 13.5 % (ref 11.5–15.5)
WBC: 6.2 10*3/uL (ref 4.0–10.5)
nRBC: 0 % (ref 0.0–0.2)

## 2020-06-02 LAB — COMPREHENSIVE METABOLIC PANEL
ALT: 15 U/L (ref 0–44)
AST: 12 U/L — ABNORMAL LOW (ref 15–41)
Albumin: 3.9 g/dL (ref 3.5–5.0)
Alkaline Phosphatase: 146 U/L — ABNORMAL HIGH (ref 38–126)
Anion gap: 7 (ref 5–15)
BUN: 12 mg/dL (ref 6–20)
CO2: 24 mmol/L (ref 22–32)
Calcium: 9.3 mg/dL (ref 8.9–10.3)
Chloride: 107 mmol/L (ref 98–111)
Creatinine, Ser: 0.51 mg/dL (ref 0.44–1.00)
GFR, Estimated: >60 mL/min (ref >60)
Glucose, Bld: 147 mg/dL — ABNORMAL HIGH (ref 70–99)
Potassium: 3.9 mmol/L (ref 3.5–5.1)
Sodium: 138 mmol/L (ref 135–145)
Total Bilirubin: 0.2 mg/dL — ABNORMAL LOW (ref 0.3–1.2)
Total Protein: 6.9 g/dL (ref 6.5–8.1)

## 2020-06-02 MED ORDER — DARATUMUMAB-HYALURONIDASE-FIHJ 1800-30000 MG-UT/15ML ~~LOC~~ SOLN
1800.0000 mg | Freq: Once | SUBCUTANEOUS | Status: AC
Start: 2020-06-02 — End: 2020-06-02
  Administered 2020-06-02: 1800 mg via SUBCUTANEOUS
  Filled 2020-06-02: qty 15

## 2020-06-02 MED ORDER — ACETAMINOPHEN 325 MG PO TABS
650.0000 mg | ORAL_TABLET | Freq: Once | ORAL | Status: AC
  Administered 2020-06-02: 650 mg via ORAL
  Filled 2020-06-02: qty 2

## 2020-06-02 MED ORDER — DIPHENHYDRAMINE HCL 25 MG PO CAPS
50.0000 mg | ORAL_CAPSULE | Freq: Once | ORAL | Status: AC
  Administered 2020-06-02: 50 mg via ORAL
  Filled 2020-06-02: qty 2

## 2020-06-02 MED ORDER — DEXAMETHASONE 4 MG PO TABS
20.0000 mg | ORAL_TABLET | Freq: Once | ORAL | Status: AC
  Administered 2020-06-02: 20 mg via ORAL
  Filled 2020-06-02: qty 5

## 2020-06-02 MED ORDER — BORTEZOMIB CHEMO SQ INJECTION 3.5 MG (2.5MG/ML)
1.3000 mg/m2 | Freq: Once | INTRAMUSCULAR | Status: AC
Start: 2020-06-02 — End: 2020-06-02
  Administered 2020-06-02: 2.25 mg via SUBCUTANEOUS
  Filled 2020-06-02: qty 0.9

## 2020-06-02 NOTE — Patient Instructions (Signed)
[facility] Discharge Instructions for Patients Receiving Chemotherapy  Today you received the following chemotherapy agents   To help prevent nausea and vomiting after your treatment, we encourage you to take your nausea medication   If you develop nausea and vomiting that is not controlled by your nausea medication, call the clinic.   BELOW ARE SYMPTOMS THAT SHOULD BE REPORTED IMMEDIATELY:  *FEVER GREATER THAN 100.5 F  *CHILLS WITH OR WITHOUT FEVER  NAUSEA AND VOMITING THAT IS NOT CONTROLLED WITH YOUR NAUSEA MEDICATION  *UNUSUAL SHORTNESS OF BREATH  *UNUSUAL BRUISING OR BLEEDING  TENDERNESS IN MOUTH AND THROAT WITH OR WITHOUT PRESENCE OF ULCERS  *URINARY PROBLEMS  *BOWEL PROBLEMS  UNUSUAL RASH Items with * indicate a potential emergency and should be followed up as soon as possible.  Feel free to call the clinic should you have any questions or concerns. The clinic phone number is [phone].  Please show the CHEMO ALERT CARD at check-in to the Emergency Department and triage nurse.   

## 2020-06-02 NOTE — Progress Notes (Signed)
Patient presets today for velcade and daratumumab injections.  Vital signs within parameters for treatment.  Labs pending.  No new complaints since last visit.  Labs within parameters for treatment.  Patient taking Revilimid at home as prescribed.  Velcade and daratumumab injections. given today per MD orders.  Tolerated injections without adverse affects.  Injection sites WNL. Vital signs stable.  No complaints at this time.  Discharge from clinic ambulatory in stable condition.  Alert and oriented X 3.  Follow up with [facility] as scheduled.

## 2020-06-05 ENCOUNTER — Inpatient Hospital Stay (HOSPITAL_COMMUNITY): Payer: Medicaid Other

## 2020-06-05 ENCOUNTER — Other Ambulatory Visit: Payer: Self-pay

## 2020-06-05 VITALS — BP 117/84 | HR 70 | Temp 97.2°F | Resp 20

## 2020-06-05 DIAGNOSIS — E109 Type 1 diabetes mellitus without complications: Secondary | ICD-10-CM | POA: Diagnosis not present

## 2020-06-05 DIAGNOSIS — C9000 Multiple myeloma not having achieved remission: Secondary | ICD-10-CM | POA: Diagnosis not present

## 2020-06-05 DIAGNOSIS — E876 Hypokalemia: Secondary | ICD-10-CM | POA: Diagnosis not present

## 2020-06-05 DIAGNOSIS — Z5112 Encounter for antineoplastic immunotherapy: Secondary | ICD-10-CM | POA: Diagnosis not present

## 2020-06-05 DIAGNOSIS — I10 Essential (primary) hypertension: Secondary | ICD-10-CM | POA: Diagnosis not present

## 2020-06-05 LAB — CBC WITH DIFFERENTIAL/PLATELET
Abs Immature Granulocytes: 0.02 10*3/uL (ref 0.00–0.07)
Basophils Absolute: 0.1 10*3/uL (ref 0.0–0.1)
Basophils Relative: 2 %
Eosinophils Absolute: 0.2 10*3/uL (ref 0.0–0.5)
Eosinophils Relative: 4 %
HCT: 42.1 % (ref 36.0–46.0)
Hemoglobin: 13.9 g/dL (ref 12.0–15.0)
Immature Granulocytes: 0 %
Lymphocytes Relative: 34 %
Lymphs Abs: 1.7 10*3/uL (ref 0.7–4.0)
MCH: 30.9 pg (ref 26.0–34.0)
MCHC: 33 g/dL (ref 30.0–36.0)
MCV: 93.6 fL (ref 80.0–100.0)
Monocytes Absolute: 0.8 10*3/uL (ref 0.1–1.0)
Monocytes Relative: 16 %
Neutro Abs: 2.2 10*3/uL (ref 1.7–7.7)
Neutrophils Relative %: 44 %
Platelets: 367 10*3/uL (ref 150–400)
RBC: 4.5 MIL/uL (ref 3.87–5.11)
RDW: 13.3 % (ref 11.5–15.5)
WBC: 5 10*3/uL (ref 4.0–10.5)
nRBC: 0 % (ref 0.0–0.2)

## 2020-06-05 LAB — COMPREHENSIVE METABOLIC PANEL
ALT: 15 U/L (ref 0–44)
AST: 10 U/L — ABNORMAL LOW (ref 15–41)
Albumin: 4.3 g/dL (ref 3.5–5.0)
Alkaline Phosphatase: 166 U/L — ABNORMAL HIGH (ref 38–126)
Anion gap: 10 (ref 5–15)
BUN: 10 mg/dL (ref 6–20)
CO2: 24 mmol/L (ref 22–32)
Calcium: 8.6 mg/dL — ABNORMAL LOW (ref 8.9–10.3)
Chloride: 100 mmol/L (ref 98–111)
Creatinine, Ser: 0.51 mg/dL (ref 0.44–1.00)
GFR, Estimated: >60 mL/min (ref >60)
Glucose, Bld: 164 mg/dL — ABNORMAL HIGH (ref 70–99)
Potassium: 3.3 mmol/L — ABNORMAL LOW (ref 3.5–5.1)
Sodium: 134 mmol/L — ABNORMAL LOW (ref 135–145)
Total Bilirubin: 0.4 mg/dL (ref 0.3–1.2)
Total Protein: 7.2 g/dL (ref 6.5–8.1)

## 2020-06-05 LAB — PREGNANCY, URINE: Preg Test, Ur: NEGATIVE

## 2020-06-05 MED ORDER — DEXAMETHASONE 4 MG PO TABS
20.0000 mg | ORAL_TABLET | Freq: Once | ORAL | Status: AC
  Administered 2020-06-05: 20 mg via ORAL
  Filled 2020-06-05: qty 5

## 2020-06-05 MED ORDER — BORTEZOMIB CHEMO SQ INJECTION 3.5 MG (2.5MG/ML)
1.3000 mg/m2 | Freq: Once | INTRAMUSCULAR | Status: AC
  Administered 2020-06-05: 2.25 mg via SUBCUTANEOUS
  Filled 2020-06-05: qty 0.9

## 2020-06-05 NOTE — Progress Notes (Signed)
Patient presents today for Velcade injection.  Vital signs within parameters for treatment.  No new complaints since last visit.  Velcade injection given today per MD orders.  Tolerated injection without adverse affects.  Injection site WNL. No complaints at this time.  Discharge from clinic ambulatory in stable condition.  Alert and oriented X 3.  Follow up with [facility] as scheduled.

## 2020-06-05 NOTE — Patient Instructions (Signed)
[facility] Discharge Instructions for Patients Receiving Chemotherapy  Today you received the following chemotherapy agents   To help prevent nausea and vomiting after your treatment, we encourage you to take your nausea medication   If you develop nausea and vomiting that is not controlled by your nausea medication, call the clinic.   BELOW ARE SYMPTOMS THAT SHOULD BE REPORTED IMMEDIATELY:  *FEVER GREATER THAN 100.5 F  *CHILLS WITH OR WITHOUT FEVER  NAUSEA AND VOMITING THAT IS NOT CONTROLLED WITH YOUR NAUSEA MEDICATION  *UNUSUAL SHORTNESS OF BREATH  *UNUSUAL BRUISING OR BLEEDING  TENDERNESS IN MOUTH AND THROAT WITH OR WITHOUT PRESENCE OF ULCERS  *URINARY PROBLEMS  *BOWEL PROBLEMS  UNUSUAL RASH Items with * indicate a potential emergency and should be followed up as soon as possible.  Feel free to call the clinic should you have any questions or concerns. The clinic phone number is [phone].  Please show the CHEMO ALERT CARD at check-in to the Emergency Department and triage nurse.   

## 2020-06-08 LAB — PROTEIN ELECTROPHORESIS, SERUM
A/G Ratio: 1.3 (ref 0.7–1.7)
Albumin ELP: 3.9 g/dL (ref 2.9–4.4)
Alpha-1-Globulin: 0.2 g/dL (ref 0.0–0.4)
Alpha-2-Globulin: 0.9 g/dL (ref 0.4–1.0)
Beta Globulin: 1.3 g/dL (ref 0.7–1.3)
Gamma Globulin: 0.6 g/dL (ref 0.4–1.8)
Globulin, Total: 3 g/dL (ref 2.2–3.9)
M-Spike, %: 0.2 g/dL — ABNORMAL HIGH
Total Protein ELP: 6.9 g/dL (ref 6.0–8.5)

## 2020-06-08 LAB — KAPPA/LAMBDA LIGHT CHAINS
Kappa free light chain: 8.4 mg/L (ref 3.3–19.4)
Kappa, lambda light chain ratio: 2.71 — ABNORMAL HIGH (ref 0.26–1.65)
Lambda free light chains: 3.1 mg/L — ABNORMAL LOW (ref 5.7–26.3)

## 2020-06-09 ENCOUNTER — Other Ambulatory Visit: Payer: Self-pay

## 2020-06-09 ENCOUNTER — Inpatient Hospital Stay (HOSPITAL_BASED_OUTPATIENT_CLINIC_OR_DEPARTMENT_OTHER): Payer: Medicaid Other | Admitting: Hematology

## 2020-06-09 ENCOUNTER — Inpatient Hospital Stay (HOSPITAL_COMMUNITY): Payer: Medicaid Other

## 2020-06-09 ENCOUNTER — Encounter (HOSPITAL_COMMUNITY): Payer: Self-pay | Admitting: Hematology

## 2020-06-09 ENCOUNTER — Ambulatory Visit (HOSPITAL_COMMUNITY): Payer: Medicaid Other

## 2020-06-09 VITALS — BP 125/83 | HR 82 | Temp 97.3°F | Resp 18 | Wt 162.0 lb

## 2020-06-09 DIAGNOSIS — C9000 Multiple myeloma not having achieved remission: Secondary | ICD-10-CM

## 2020-06-09 DIAGNOSIS — I10 Essential (primary) hypertension: Secondary | ICD-10-CM | POA: Diagnosis not present

## 2020-06-09 DIAGNOSIS — E109 Type 1 diabetes mellitus without complications: Secondary | ICD-10-CM | POA: Diagnosis not present

## 2020-06-09 DIAGNOSIS — E876 Hypokalemia: Secondary | ICD-10-CM | POA: Diagnosis not present

## 2020-06-09 DIAGNOSIS — Z5112 Encounter for antineoplastic immunotherapy: Secondary | ICD-10-CM | POA: Diagnosis not present

## 2020-06-09 LAB — CBC WITH DIFFERENTIAL/PLATELET
Abs Immature Granulocytes: 0.04 10*3/uL (ref 0.00–0.07)
Basophils Absolute: 0 10*3/uL (ref 0.0–0.1)
Basophils Relative: 1 %
Eosinophils Absolute: 0.3 10*3/uL (ref 0.0–0.5)
Eosinophils Relative: 7 %
HCT: 39.9 % (ref 36.0–46.0)
Hemoglobin: 13.1 g/dL (ref 12.0–15.0)
Immature Granulocytes: 1 %
Lymphocytes Relative: 21 %
Lymphs Abs: 0.9 10*3/uL (ref 0.7–4.0)
MCH: 30.5 pg (ref 26.0–34.0)
MCHC: 32.8 g/dL (ref 30.0–36.0)
MCV: 92.8 fL (ref 80.0–100.0)
Monocytes Absolute: 0.3 10*3/uL (ref 0.1–1.0)
Monocytes Relative: 7 %
Neutro Abs: 2.9 10*3/uL (ref 1.7–7.7)
Neutrophils Relative %: 63 %
Platelets: 281 10*3/uL (ref 150–400)
RBC: 4.3 MIL/uL (ref 3.87–5.11)
RDW: 13.4 % (ref 11.5–15.5)
WBC: 4.5 10*3/uL (ref 4.0–10.5)
nRBC: 0 % (ref 0.0–0.2)

## 2020-06-09 LAB — COMPREHENSIVE METABOLIC PANEL
ALT: 15 U/L (ref 0–44)
AST: 12 U/L — ABNORMAL LOW (ref 15–41)
Albumin: 3.8 g/dL (ref 3.5–5.0)
Alkaline Phosphatase: 146 U/L — ABNORMAL HIGH (ref 38–126)
Anion gap: 8 (ref 5–15)
BUN: 11 mg/dL (ref 6–20)
CO2: 24 mmol/L (ref 22–32)
Calcium: 8.4 mg/dL — ABNORMAL LOW (ref 8.9–10.3)
Chloride: 104 mmol/L (ref 98–111)
Creatinine, Ser: 0.53 mg/dL (ref 0.44–1.00)
GFR, Estimated: >60 mL/min (ref >60)
Glucose, Bld: 229 mg/dL — ABNORMAL HIGH (ref 70–99)
Potassium: 3.5 mmol/L (ref 3.5–5.1)
Sodium: 136 mmol/L (ref 135–145)
Total Bilirubin: 0.5 mg/dL (ref 0.3–1.2)
Total Protein: 6.3 g/dL — ABNORMAL LOW (ref 6.5–8.1)

## 2020-06-09 MED ORDER — DARATUMUMAB-HYALURONIDASE-FIHJ 1800-30000 MG-UT/15ML ~~LOC~~ SOLN
1800.0000 mg | Freq: Once | SUBCUTANEOUS | Status: AC
  Administered 2020-06-09: 1800 mg via SUBCUTANEOUS
  Filled 2020-06-09: qty 15

## 2020-06-09 MED ORDER — DIPHENHYDRAMINE HCL 25 MG PO CAPS
50.0000 mg | ORAL_CAPSULE | Freq: Once | ORAL | Status: AC
  Administered 2020-06-09: 50 mg via ORAL
  Filled 2020-06-09: qty 2

## 2020-06-09 MED ORDER — DEXAMETHASONE 4 MG PO TABS
20.0000 mg | ORAL_TABLET | Freq: Once | ORAL | Status: AC
  Administered 2020-06-09: 20 mg via ORAL
  Filled 2020-06-09: qty 5

## 2020-06-09 MED ORDER — ACETAMINOPHEN 325 MG PO TABS
650.0000 mg | ORAL_TABLET | Freq: Once | ORAL | Status: AC
  Administered 2020-06-09: 650 mg via ORAL
  Filled 2020-06-09: qty 2

## 2020-06-09 MED ORDER — BORTEZOMIB CHEMO SQ INJECTION 3.5 MG (2.5MG/ML)
1.3000 mg/m2 | Freq: Once | INTRAMUSCULAR | Status: AC
  Administered 2020-06-09: 2.25 mg via SUBCUTANEOUS
  Filled 2020-06-09: qty 0.9

## 2020-06-09 NOTE — Patient Instructions (Addendum)
[place] at [facility] Discharge Instructions  You were seen today by Dr. Delton Coombes. He went over your recent results. You received your treatment today. You may get your COVID booster vaccine next week. Keep your appointment at [facility]. Dr. Delton Coombes will see you back in 2 weeks for labs and follow up.   Thank you for choosing [place] at [facility] to provide your oncology and hematology care.  To afford each patient quality time with our provider, please arrive at least 15 minutes before your scheduled appointment time.   If you have a lab appointment with the [facility] please come in thru the Main Entrance and check in at the main information desk  You need to re-schedule your appointment should you arrive 10 or more minutes late.  We strive to give you quality time with our providers, and arriving late affects you and other patients whose appointments are after yours.  Also, if you no show three or more times for appointments you may be dismissed from the clinic at the providers discretion.     Again, thank you for choosing [facility].  Our hope is that these requests will decrease the amount of time that you wait before being seen by our physicians.       _____________________________________________________________  Should you have questions after your visit to [facility], please contact our office at [phone] between the hours of 8:00 a.m. and 4:30 p.m.  Voicemails left after 4:00 p.m. will not be returned until the following business day.  For prescription refill requests, have your pharmacy contact our office and allow 72 hours.    Cancer Center Support Programs:   > Cancer Support Group  2nd Tuesday of the month 1pm-2pm, Journey Room

## 2020-06-09 NOTE — Progress Notes (Signed)
Patient assessed and labs reviewed by Dr. Katragadda. Okay to proceed with treatment. Primary RN and pharmacy aware. 

## 2020-06-09 NOTE — Progress Notes (Signed)
Pt is taking Revlimid as prescribed with no side effects. 

## 2020-06-09 NOTE — Progress Notes (Signed)
Pt here for D8C4 of velcade. ALK PHOS 146.  Okay for treatment today.  Tolerated treatment well today without incidence.  Discharged in stable condition ambulatory.  

## 2020-06-09 NOTE — Progress Notes (Signed)
Received orders to give Darzalex Faspro today.  Orders entered into the plan.  T.O. Dr Rhys Martini, PharmD

## 2020-06-09 NOTE — Progress Notes (Signed)
Diamond Robbins, [name] [id number]   CLINIC:  Medical Oncology/Hematology  PCP:  Patient, No Pcp Per Diamond Robbins Diamond Robbins   REASON FOR VISIT:  Follow-up for IgA kappa multiple myeloma  PRIOR THERAPY: Diamond Robbins  NGS Results: Not done  CURRENT THERAPY: RVD and Darzalex Faspro every 3 days for 3 weeks; Revlimid 2 weeks, 1 week off  BRIEF ONCOLOGIC HISTORY:  Oncology History  Multiple myeloma ([place])  03/09/2020 Initial Diagnosis   Multiple myeloma ([place])   03/31/2020 -  Chemotherapy    Patient is on Treatment Plan: MYELOMA RELAPSED / REFRACTORY DARATUMUMAB SQ + BORTEZOMIB + DEXAMETHASONE (DARAVD) Q21D / DARATUMUMAB SQ Q28D         CANCER STAGING: Cancer Staging No matching staging information was found for the patient.  INTERVAL HISTORY:  [name], a 30 y.o. female, returns for routine follow-up and consideration for next cycle of chemotherapy. [name] was last seen on 05/12/2020.  Due for day #8 of cycle #4 of bortezomib today.   Overall, she tells me she has been feeling pretty well. She reports that her menstrual cycle was 1 day late, but she does not think that she is pregnant. She tolerated the previous treatment well and denies having N/V/D/C, numbness or abdominal pain. She is tolerating the Revlimid well. She is taking Lantus 25 units at bedtime. She denies having any recent infections, F/C, or leg swelling and her appetite is excellent.  She will go to [facility] on 1/27 for transplant planning. She is wondering if she can get her COVID booster.  Overall, she feels ready for next cycle of chemo today.    REVIEW OF SYSTEMS:  Review of Systems  Constitutional: Negative for appetite change, chills, fatigue and fever.  Cardiovascular: Negative for leg swelling.  Gastrointestinal: Negative for abdominal pain, constipation, diarrhea, nausea and vomiting.  Neurological: Negative for numbness.  All other systems reviewed and are  negative.   PAST MEDICAL/SURGICAL HISTORY:  Past Medical History:  Diagnosis Date  . DKA (diabetic ketoacidosis) ([place]) 02/23/2020  . Hypertension   . Type 1 diabetes mellitus ([place]) 02/23/2020   Past Surgical History:  Procedure Laterality Date  . NO PAST SURGERIES      SOCIAL HISTORY:  Social History   Socioeconomic History  . Marital status: Single    Spouse name: Not on file  . Number of children: Not on file  . Years of education: Not on file  . Highest education level: Not on file  Occupational History  . Occupation: unemployed  Tobacco Use  . Smoking status: Never Smoker  . Smokeless tobacco: Never Used  Vaping Use  . Vaping Use: Never used  Substance and Sexual Activity  . Alcohol use: Not Currently    Comment: occ  . Drug use: No  . Sexual activity: Yes    Birth control/protection: Condom  Other Topics Concern  . Not on file  Social History Narrative   Pt is engaged   Social Determinants of Health   Financial Resource Strain: Low Risk   . Difficulty of Paying Living Expenses: Not hard at all  Food Insecurity: No Food Insecurity  . Worried About Charity fundraiser in the Last Year: Never true  . Ran Out of Food in the Last Year: Never true  Transportation Needs: No Transportation Needs  . Lack of Transportation (Medical): No  . Lack of Transportation (Non-Medical): No  Physical Activity: Insufficiently Active  . Days of Exercise per  Week: 3 days  . Minutes of Exercise per Session: 10 min  Stress: No Stress Concern Present  . Feeling of Stress : Not at all  Social Connections: Moderately Integrated  . Frequency of Communication with Friends and Family: More than three times a week  . Frequency of Social Gatherings with Friends and Family: More than three times a week  . Attends Religious Services: More than 4 times per year  . Active Member of Clubs or Organizations: No  . Attends Archivist Meetings: Never  . Marital Status: Married   Human resources officer Violence: Not At Risk  . Fear of Current or Ex-Partner: No  . Emotionally Abused: No  . Physically Abused: No  . Sexually Abused: No    FAMILY HISTORY:  Family History  Problem Relation Age of Onset  . Hypertension Mother   . Diabetes Mother   . Hypertension Father   . Diabetes Maternal Grandmother     CURRENT MEDICATIONS:  Current Outpatient Medications  Medication Sig Dispense Refill  . ACCU-CHEK GUIDE test strip     . Accu-Chek Softclix Lancets lancets 1 each 3 (three) times daily.    Marland Kitchen acyclovir (ZOVIRAX) 400 MG tablet Take 1 tablet (400 mg total) by mouth 2 (two) times daily. 60 tablet 6  . amLODipine (NORVASC) 10 MG tablet Take 10 mg by mouth daily.    Marland Kitchen aspirin EC 81 MG tablet Take 1 tablet (81 mg total) by mouth daily. Swallow whole. 30 tablet 11  . Blood Glucose Monitoring Suppl (ACCU-CHEK GUIDE) w/Device KIT     . bortezomib IV (VELCADE) 3.5 MG injection Inject 1.3 mg/m2 into the vein once.    . Daratumumab-Hyaluronidase-fihj (DARZALEX FASPRO [place]) Inject 1,800 mg into the skin.    Marland Kitchen LANTUS SOLOSTAR 100 UNIT/ML Solostar Pen Inject 25 Units into the skin at bedtime.     Marland Kitchen lenalidomide (REVLIMID) 20 MG capsule Take 1 capsule (20 mg total) by mouth daily. 14 capsule 0  . metoprolol tartrate (LOPRESSOR) 25 MG tablet Take 25 mg by mouth daily.    . potassium chloride SA (KLOR-CON) 20 MEQ tablet Take 1 tablet (20 mEq total) by mouth daily. 30 tablet 0   No current facility-administered medications for this visit.    ALLERGIES:  No Known Allergies  PHYSICAL EXAM:  Performance status (ECOG): 1 - Symptomatic but completely ambulatory  Vitals:   06/09/20 1005  BP: 125/83  Pulse: 82  Resp: 18  Temp: (!) 97.3 F (36.3 C)  SpO2: 100%   Wt Readings from Last 3 Encounters:  06/09/20 162 lb (73.5 kg)  06/02/20 158 lb 12.8 oz (72 kg)  05/22/20 155 lb 6.4 oz (70.5 kg)   Physical Exam Vitals reviewed.  Constitutional:      Appearance: Normal  appearance.  Cardiovascular:     Rate and Rhythm: Normal rate and regular rhythm.     Pulses: Normal pulses.     Heart sounds: Normal heart sounds.  Pulmonary:     Effort: Pulmonary effort is normal.     Breath sounds: Normal breath sounds.  Abdominal:     Palpations: Abdomen is soft. There is no mass.     Tenderness: There is no abdominal tenderness.  Musculoskeletal:     Right lower leg: No edema.     Left lower leg: No edema.  Neurological:     General: No focal deficit present.     Mental Status: She is alert and oriented to person, place, and time.  Psychiatric:        Mood and Affect: Mood normal.        Behavior: Behavior normal.     LABORATORY DATA:  I have reviewed the labs as listed.  CBC Latest Ref Rng & Units 06/09/2020 06/05/2020 06/02/2020  WBC 4.0 - 10.5 K/uL 4.5 5.0 6.2  Hemoglobin 12.0 - 15.0 g/dL 13.1 13.9 13.2  Hematocrit 36.0 - 46.0 % 39.9 42.1 41.0  Platelets 150 - 400 K/uL 281 367 383   CMP Latest Ref Rng & Units 06/09/2020 06/05/2020 06/02/2020  Glucose 70 - 99 mg/dL 229(H) 164(H) 147(H)  BUN 6 - 20 mg/dL _0 Creatinine 0.44 - 1.00 mg/dL 0.53 0.51 0.51  Sodium 135 - 145 mmol/L 136 134(L) 138  Potassium 3.5 - 5.1 mmol/L 3.5 3.3(L) 3.9  Chloride 98 - 111 mmol/L 104 100 107  CO2 22 - 32 mmol/L _1 Calcium 8.9 - 10.3 mg/dL 8.4(L) 8.6(L) 9.3  Total Protein 6.5 - 8.1 g/dL 6.3(L) 7.2 6.9  Total Bilirubin 0.3 - 1.2 mg/dL 0.5 0.4 0.2(L)  Alkaline Phos 38 - 126 U/L 146(H) 166(H) 146(H)  AST 15 - 41 U/L 12(L) 10(L) 12(L)  ALT 0 - 44 U/L _2 Lab Results  Component Value Date   TOTALPROTELP 6.9 06/05/2020   ALBUMINELP 3.9 06/05/2020   A1GS 0.2 06/05/2020   A2GS 0.9 06/05/2020   BETS 1.3 06/05/2020   GAMS 0.6 06/05/2020   MSPIKE 0.2 (H) 06/05/2020   SPEI Comment 06/05/2020    Lab Results  Component Value Date   KPAFRELGTCHN 8.4 06/05/2020   LAMBDASER 3.1 (L) 06/05/2020   KAPLAMBRATIO 2.71 (H) 06/05/2020    DIAGNOSTIC IMAGING:  I  have independently reviewed the scans and discussed with the patient. No results found.   ASSESSMENT:  1.Stage IIIIgA kappa multiple myeloma: -Presentation to [facility] in [place] with confusion and weakness. -Found to have diabetic ketoacidosis with newly diagnosed type 1 diabetes. -Also found to have acute renal failureand hypercalcemia. -Bone marrow biopsy on 02/25/2020 with flow cytometry with the population CD138 positive, CD56 positive IgA kappa monoclonal plasma cells 12%. 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 -Ultrasound abdomen on 02/23/2020 showed increased hepatic echogenicity with normal spleen. -Renal ultrasound on 02/24/2020 was normal. -CT chest on 02/22/2020 withmoth eatenappearance of the bones. -Beta-2 microglobulin 2.1. LDH 322. SPEP and immunofixation were normal. Kappa light chains are elevated at 36.8. Lambda light chains 12.9 and ratio of 2.85. -24-hour urine immunofixation was negative. M spike was negative. -PET scan on 03/23/2020 showed no FDG radiotracer activity, no soft tissue plasmacytoma. Diffuse multiple small lytic lesions within the pelvis, spine and vertebral bodies consistent with myeloma. -Chromosome analysis was 37, XX. Multiple myeloma FISH panel was normal. -She is considered high risk based on elevated LDH level. -Cycle 1 of Velcade, Revlimid, Darzalex and dexamethasone started on 03/31/2020.  2. 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 Non-smoker. -No family history of myeloma or other malignancies.   PLAN:  1.Stage IIIIgA kappa multiple myeloma: -She is tolerating 4 drug regimen very well. -Reviewed myeloma panel from 06/05/2020.  M spike is 0.2 g.  Kappa light chains are normal.  Ratio is 2.71. -M spike has become positive after 2-3 treatments of  daratumumab.  Hence it is likely IgG kappa protein. -She has an appointment to see Dr. Feliciana Rossetti at [facility] on Friday. -I plan to see her back in 2 weeks for follow-up.  2. Type I diabetes: -Continue Lantus 25 units - 28 units at nighttime.  3. Hypertension: -Continue Norvasc and metoprolol.  This is well controlled.  4. Hypokalemia: -She is no longer requiring potassium supplements.  Potassium is 3.5 today.   Orders placed this encounter:  No orders of the defined types were placed in this encounter.    [name], MD [place] [phone]   I, Milinda Antis, am acting as a scribe for Dr. Sanda Linger.  I, [name] MD, have reviewed the above documentation for accuracy and completeness, and I agree with the above.

## 2020-06-09 NOTE — Patient Instructions (Signed)
[facility] Discharge Instructions for Patients Receiving Chemotherapy  Today you received the following chemotherapy agents   To help prevent nausea and vomiting after your treatment, we encourage you to take your nausea medication   If you develop nausea and vomiting that is not controlled by your nausea medication, call the clinic.   BELOW ARE SYMPTOMS THAT SHOULD BE REPORTED IMMEDIATELY:  *FEVER GREATER THAN 100.5 F  *CHILLS WITH OR WITHOUT FEVER  NAUSEA AND VOMITING THAT IS NOT CONTROLLED WITH YOUR NAUSEA MEDICATION  *UNUSUAL SHORTNESS OF BREATH  *UNUSUAL BRUISING OR BLEEDING  TENDERNESS IN MOUTH AND THROAT WITH OR WITHOUT PRESENCE OF ULCERS  *URINARY PROBLEMS  *BOWEL PROBLEMS  UNUSUAL RASH Items with * indicate a potential emergency and should be followed up as soon as possible.  Feel free to call the clinic should you have any questions or concerns. The clinic phone number is [phone].  Please show the CHEMO ALERT CARD at check-in to the Emergency Department and triage nurse.   

## 2020-06-11 DIAGNOSIS — C9000 Multiple myeloma not having achieved remission: Secondary | ICD-10-CM | POA: Diagnosis not present

## 2020-06-11 DIAGNOSIS — E109 Type 1 diabetes mellitus without complications: Secondary | ICD-10-CM | POA: Insufficient documentation

## 2020-06-12 ENCOUNTER — Inpatient Hospital Stay (HOSPITAL_COMMUNITY): Payer: Medicaid Other

## 2020-06-12 ENCOUNTER — Other Ambulatory Visit: Payer: Self-pay

## 2020-06-12 ENCOUNTER — Encounter (HOSPITAL_COMMUNITY): Payer: Self-pay

## 2020-06-12 VITALS — BP 120/84 | HR 83 | Temp 97.0°F | Resp 18 | Wt 158.8 lb

## 2020-06-12 DIAGNOSIS — E876 Hypokalemia: Secondary | ICD-10-CM | POA: Diagnosis not present

## 2020-06-12 DIAGNOSIS — Z5112 Encounter for antineoplastic immunotherapy: Secondary | ICD-10-CM | POA: Diagnosis not present

## 2020-06-12 DIAGNOSIS — E109 Type 1 diabetes mellitus without complications: Secondary | ICD-10-CM | POA: Diagnosis not present

## 2020-06-12 DIAGNOSIS — C9000 Multiple myeloma not having achieved remission: Secondary | ICD-10-CM

## 2020-06-12 DIAGNOSIS — I10 Essential (primary) hypertension: Secondary | ICD-10-CM | POA: Diagnosis not present

## 2020-06-12 MED ORDER — DEXAMETHASONE 4 MG PO TABS
20.0000 mg | ORAL_TABLET | Freq: Once | ORAL | Status: AC
  Administered 2020-06-12: 20 mg via ORAL
  Filled 2020-06-12: qty 5

## 2020-06-12 MED ORDER — BORTEZOMIB CHEMO SQ INJECTION 3.5 MG (2.5MG/ML)
1.3000 mg/m2 | Freq: Once | INTRAMUSCULAR | Status: AC
Start: 2020-06-12 — End: 2020-06-12
  Administered 2020-06-12: 2.25 mg via SUBCUTANEOUS
  Filled 2020-06-12: qty 0.9

## 2020-06-12 NOTE — Progress Notes (Signed)
Patient tolerated Velcade injection with no complaints voiced.  Lab work reviewed.  See MAR for details.  Injection site clean and dry with no bruising or swelling noted.  Band aid applied.  VSS.  Patient left in satisfactory condition with no s/s of distress noted.  

## 2020-06-16 DIAGNOSIS — Z419 Encounter for procedure for purposes other than remedying health state, unspecified: Secondary | ICD-10-CM | POA: Diagnosis not present

## 2020-06-17 ENCOUNTER — Telehealth: Payer: Self-pay | Admitting: General Practice

## 2020-06-17 ENCOUNTER — Other Ambulatory Visit: Payer: Self-pay

## 2020-06-17 ENCOUNTER — Other Ambulatory Visit: Payer: Self-pay | Admitting: *Deleted

## 2020-06-17 NOTE — Patient Outreach (Signed)
Medicaid Managed Care   Nurse Care Manager Note  06/17/2020 Name:  Diamond Robbins MRN:  [id number] DOB:  12-09-1990  Diamond Robbins is an 30 y.o. year old female who is a primary patient of Patient, No Pcp Per.  The [facility] Managed Care Coordination team was consulted for assistance with:    DMI multiple myeloma  Diamond Robbins was given information about Medicaid Managed Care Coordination team services today. Diamond Robbins agreed to services and verbal consent obtained.  Engaged with patient by telephone for initial visit in response to provider referral for case management and/or care coordination services.   Assessments/Interventions:  Review of past medical history, allergies, medications, health status, including review of consultants reports, laboratory and other test data, was performed as part of comprehensive evaluation and provision of chronic care management services.  SDOH (Social Determinants of Health) assessments and interventions performed:   Care Plan  No Known Allergies  Medications Reviewed Today    Reviewed by Penelope Galas, RN (Registered Nurse) on 06/12/20 at 45  Med List Status: <None>  Medication Order Taking? Sig Documenting Provider Last Dose Status Informant  ACCU-CHEK GUIDE test strip [id number]   [provider]  Active Self  Accu-Chek Softclix Lancets lancets [id number]  1 each 3 (three) times daily. [provider]  Active Self  acyclovir (ZOVIRAX) 400 MG tablet [id number]  Take 1 tablet (400 mg total) by mouth 2 (two) times daily. [name], MD  Active Self  amLODipine (NORVASC) 10 MG tablet [id number]  Take 10 mg by mouth daily. [provider]  Active Self  aspirin EC 81 MG tablet [id number]  Take 1 tablet (81 mg total) by mouth daily. Swallow whole. [name], MD  Active Self  Blood Glucose Monitoring Suppl (ACCU-CHEK GUIDE) w/Device Drucie Opitz [id number]   [provider]  Active Self  bortezomib IV  (VELCADE) 3.5 MG injection [id number]  Inject 1.3 mg/m2 into the vein once. [provider]  Active Self  bortezomib SQ (VELCADE) chemo injection (2.4m/mL concentration) 2.25 mg [id number]  KDerek Jack MD  Active   Daratumumab-Hyaluronidase-fihj ([facility] IncFASPRO [place]) [id number] Inject 1,800 mg into the skin. [provider]  Active Self  dexamethasone (DECADRON) tablet 20 mg [id number]  KDerek Jack MD  Active   LANTUS SOLOSTAR 100 UNIT/ML Solostar Pen [id number] Inject 25 Units into the skin at bedtime.  [provider]  Active Self  lenalidomide (REVLIMID) 20 MG capsule [id number] Take 1 capsule (20 mg total) by mouth daily. KDerek Jack MD  Active   metoprolol tartrate (LOPRESSOR) 25 MG tablet [id number] Take 25 mg by mouth daily. [provider]  Active Self  potassium chloride SA (KLOR-CON) 20 MEQ tablet [id number] Take 1 tablet (20 mEq total) by mouth daily. KDerek Jack MD  Active   Med List Note ([name] CPhT 03/07/13 1442): No preferred pharmacy.           Patient Active Problem List   Diagnosis Date Noted  . T1DM (type 1 diabetes mellitus) ([place] 06/11/2020  . Multiple myeloma ([place] 03/09/2020  . Goals of care, counseling/discussion 03/09/2020  . Pregnancy of unknown anatomic location 05/27/2019  . Chronic hypertension 05/24/2019    Conditions to be addressed/monitored per PCP order:  DMI  Care Plan : Diabetes Type 1 (Adult)  Updates made by RMelissa Montane RN since 06/17/2020 12:00 AM    Problem: Glycemic Management (Diabetes, Type 1)     Long-Range Goal: Glycemic Management  Optimized   Start Date: 06/17/2020  Expected End Date: 09/14/2020  This Visit's Progress: On track  Priority: High  Note:   CARE PLAN ENTRY Medicaid Managed Care (see longtitudinal plan of care for additional care plan information)  Objective:  Lab Results  Component Value Date   HGBA1C 8.8 (H) 03/11/2020 .   Lab  Results  Component Value Date   CREATININE 0.53 06/09/2020   CREATININE 0.51 06/05/2020   CREATININE 0.51 06/02/2020   . Patient reported cbg findings: preprandial readings typically 90-100 unless during the weeks of chemo treatments, due to the steroids readings are higher around 200  Current Barriers:  Marland Kitchen Knowledge Deficits related to basic Diabetes pathophysiology and self care/management-Diamond Robbins has a recent diagnosis of Type 1 Diabetes Mellitis and Multiple Myeloma. Treatment for MM is 2 weeks on and 1 week off. During her treatment weeks her BS readings are higher than usual due to her receiving steroids. She reports that at the time of diagnosis she received very helpful diabetic education. She is managing DM with diet and insulin. She is scheduled for a bone marrow transplant at [facility] on 07/16/20.  Case Manager Clinical Goal(s):  Marland Kitchen Over the next 30 days, patient will demonstrate improved adherence to prescribed treatment plan for diabetes self care/management as evidenced by:  . daily monitoring and recording of CBG . adherence to ADA/ carb modified diet . exercise 3-5 days/week . adherence to prescribed medication regimen  Interventions:  . Provided education to patient about basic DM disease process . Reviewed medications with patient and discussed importance of medication adherence . Discussed plans with patient for ongoing care management follow up and provided patient with direct contact information for care management team . Reviewed scheduled/upcoming provider appointments  . Referral made to pharmacy team for assistance with medication management . Review of patient status, including review of consultants reports, relevant laboratory and other test results, and medications completed. . Encouraged patient to schedule a follow up with her PCP and for a routine eye exam  Patient Self Care Activities:  . - call to cancel if needed . - keep a calendar with prescription  refill dates . - keep a calendar with appointment dates  . - schedule a follow up appointment with PCP .  check blood sugar at prescribed times . - check blood sugar if I feel it is too high or too low . - enter blood sugar readings and medication or insulin into daily log . - take the blood sugar log to all doctor visits . - take the blood sugar meter to all doctor visits . - schedule appointment with eye doctor  Initial goal documentation      Follow Up:  Patient agrees to Care Plan and Follow-up.  Plan: The Managed Medicaid care management team will reach out to the patient again over the next 30 days.  Date/time of next scheduled RN care management/care coordination outreach: 07/15/20 @ [facility] RN, [place] RN Care Coordinator

## 2020-06-17 NOTE — Patient Instructions (Signed)
Visit Information  Diamond Robbins was given information about Medicaid Managed Care team care coordination services as a part of their [facility] Medicaid benefit. Diamond Robbins verbally consented to engagement with the [facility] Managed Care team.   For questions related to your [facility] health plan, please call: [phone]  If you would like to schedule transportation through your [facility] plan, please call the following number at least 2 days in advance of your appointment: [phone]  Diamond Robbins - following are the goals we discussed in your visit today:  Goals Addressed            This Visit's Progress   . Make and Keep All Appointments       Timeframe:  Long-Range Goal Priority:  High Start Date:  06/17/20                           Expected End Date: 09/14/20                      Follow Up Date 07/15/20    - call to cancel if needed - keep a calendar with prescription refill dates - keep a calendar with appointment dates  - schedule a follow up appointment with PCP   Why is this important?    Part of staying healthy is seeing the doctor for follow-up care.   If you forget your appointments, there are some things you can do to stay on track.        . Monitor and Manage My Blood Sugar-Diabetes Type 1       Timeframe:  Long-Range Goal Priority:  High Start Date:   06/17/20                          Expected End Date:  09/14/20                     Follow Up Date 07/15/20   - check blood sugar at prescribed times - check blood sugar if I feel it is too high or too low - enter blood sugar readings and medication or insulin into daily log - take the blood sugar log to all doctor visits - take the blood sugar meter to all doctor visits    Why is this important?    Checking your blood sugar at home helps to keep it from getting very high or very low.   Writing the results in a diary or log helps the doctor know how to care for you.   Your blood sugar log  should have the time, the date and the results.   Also, write down the amount of insulin or other medicine you take.   Other information like what you ate, exercise done and how you were feeling will also be helpful..         . Obtain Eye Exam-Diabetes Type 1       Timeframe:  Short-Term Goal Priority:  Medium Start Date: 06/17/20                            Expected End Date:  07/15/20                     Follow Up Date 07/15/20   - schedule appointment with eye doctor    Why is this important?  Eye check-ups are important when you have diabetes.   Vision loss can be prevented.           Please see education materials related to diabetes diet provided by MyChart link.    Diabetes Mellitus and Nutrition, Adult When you have diabetes, or diabetes mellitus, it is very important to have healthy eating habits because your blood sugar (glucose) levels are greatly affected by what you eat and drink. Eating healthy foods in the right amounts, at about the same times every day, can help you:  Control your blood glucose.  Lower your risk of heart disease.  Improve your blood pressure.  Reach or maintain a healthy weight. What can affect my meal plan? Every person with diabetes is different, and each person has different needs for a meal plan. Your health care provider may recommend that you work with a dietitian to make a meal plan that is best for you. Your meal plan may vary depending on factors such as:  The calories you need.  The medicines you take.  Your weight.  Your blood glucose, blood pressure, and cholesterol levels.  Your activity level.  Other health conditions you have, such as heart or kidney disease. How do carbohydrates affect me? Carbohydrates, also called carbs, affect your blood glucose level more than any other type of food. Eating carbs naturally raises the amount of glucose in your blood. Carb counting is a method for keeping track of how many carbs  you eat. Counting carbs is important to keep your blood glucose at a healthy level, especially if you use insulin or take certain oral diabetes medicines. It is important to know how many carbs you can safely have in each meal. This is different for every person. Your dietitian can help you calculate how many carbs you should have at each meal and for each snack. How does alcohol affect me? Alcohol can cause a sudden decrease in blood glucose (hypoglycemia), especially if you use insulin or take certain oral diabetes medicines. Hypoglycemia can be a life-threatening condition. Symptoms of hypoglycemia, such as sleepiness, dizziness, and confusion, are similar to symptoms of having too much alcohol.  Do not drink alcohol if: ? Your health care provider tells you not to drink. ? You are pregnant, may be pregnant, or are planning to become pregnant.  If you drink alcohol: ? Do not drink on an empty stomach. ? Limit how much you use to:  0-1 drink a day for women.  0-2 drinks a day for men. ? Be aware of how much alcohol is in your drink. In the [place] one drink equals one 12 oz bottle of beer (355 mL), one 5 oz glass of wine (148 mL), or one 1 oz glass of hard liquor (44 mL). ? Keep yourself hydrated with water, diet soda, or unsweetened iced tea.  Keep in mind that regular soda, juice, and other mixers may contain a lot of sugar and must be counted as carbs. What are tips for following this plan? Reading food labels  Start by checking the serving size on the "Nutrition Facts" label of packaged foods and drinks. The amount of calories, carbs, fats, and other nutrients listed on the label is based on one serving of the item. Many items contain more than one serving per package.  Check the total grams (g) of carbs in one serving. You can calculate the number of servings of carbs in one serving by dividing the total carbs by 15. For example,  if a food has 30 g of total carbs per serving, it would  be equal to 2 servings of carbs.  Check the number of grams (g) of saturated fats and trans fats in one serving. Choose foods that have a low amount or none of these fats.  Check the number of milligrams (mg) of salt (sodium) in one serving. Most people should limit total sodium intake to less than 2,300 mg per day.  Always check the nutrition information of foods labeled as "low-fat" or "nonfat." These foods may be higher in added sugar or refined carbs and should be avoided.  Talk to your dietitian to identify your daily goals for nutrients listed on the label. Shopping  Avoid buying canned, pre-made, or processed foods. These foods tend to be high in fat, sodium, and added sugar.  Shop around the outside edge of the grocery store. This is where you will most often find fresh fruits and vegetables, bulk grains, fresh meats, and fresh dairy. Cooking  Use low-heat cooking methods, such as baking, instead of high-heat cooking methods like deep frying.  Cook using healthy oils, such as olive, canola, or sunflower oil.  Avoid cooking with butter, cream, or high-fat meats. Meal planning  Eat meals and snacks regularly, preferably at the same times every day. Avoid going long periods of time without eating.  Eat foods that are high in fiber, such as fresh fruits, vegetables, beans, and whole grains. Talk with your dietitian about how many servings of carbs you can eat at each meal.  Eat 4-6 oz (112-168 g) of lean protein each day, such as lean meat, chicken, fish, eggs, or tofu. One ounce (oz) of lean protein is equal to: ? 1 oz (28 g) of meat, chicken, or fish. ? 1 egg. ?  cup (62 g) of tofu.  Eat some foods each day that contain healthy fats, such as avocado, nuts, seeds, and fish.   What foods should I eat? Fruits Berries. Apples. Oranges. Peaches. Apricots. Plums. Grapes. Mango. Papaya. Pomegranate. Kiwi. Cherries. Vegetables Lettuce. Spinach. Leafy greens, including kale, chard,  collard greens, and mustard greens. Beets. Cauliflower. Cabbage. Broccoli. Carrots. Green beans. Tomatoes. Peppers. Onions. Cucumbers. Brussels sprouts. Grains Whole grains, such as whole-wheat or whole-grain bread, crackers, tortillas, cereal, and pasta. Unsweetened oatmeal. Quinoa. Brown or wild rice. Meats and other proteins Seafood. Poultry without skin. Lean cuts of poultry and beef. Tofu. Nuts. Seeds. Dairy Low-fat or fat-free dairy products such as milk, yogurt, and cheese. The items listed above may not be a complete list of foods and beverages you can eat. Contact a dietitian for more information. What foods should I avoid? Fruits Fruits canned with syrup. Vegetables Canned vegetables. Frozen vegetables with butter or cream sauce. Grains Refined white flour and flour products such as bread, pasta, snack foods, and cereals. Avoid all processed foods. Meats and other proteins Fatty cuts of meat. Poultry with skin. Breaded or fried meats. Processed meat. Avoid saturated fats. Dairy Full-fat yogurt, cheese, or milk. Beverages Sweetened drinks, such as soda or iced tea. The items listed above may not be a complete list of foods and beverages you should avoid. Contact a dietitian for more information. Questions to ask a health care provider  Do I need to meet with a diabetes educator?  Do I need to meet with a dietitian?  What number can I call if I have questions?  When are the best times to check my blood glucose? Where to find more information:  American Diabetes Association: diabetes.org  Academy of Nutrition and Dietetics: www.eatright.[employer] of Diabetes and Digestive and Kidney Diseases: [employer]  Association of Diabetes Care and Education Specialists: www.diabeteseducator.org Summary  It is important to have healthy eating habits because your blood sugar (glucose) levels are greatly affected by what you eat and drink.  A healthy meal  plan will help you control your blood glucose and maintain a healthy lifestyle.  Your health care provider may recommend that you work with a dietitian to make a meal plan that is best for you.  Keep in mind that carbohydrates (carbs) and alcohol have immediate effects on your blood glucose levels. It is important to count carbs and to use alcohol carefully. This information is not intended to replace advice given to you by your health care provider. Make sure you discuss any questions you have with your health care provider. Document Revised: 04/09/2019 Document Reviewed: 04/09/2019 Elsevier Patient Education  2021 [place].   Patient verbalizes understanding of instructions provided today.   Telephone follow up appointment with Managed Medicaid care management team member scheduled for:07/15/20 @ 9am  Diamond Montane, RN  Following is a copy of your plan of care:  Patient Care Plan: Diabetes Type 1 (Adult)    Problem Identified: Glycemic Management (Diabetes, Type 1)     Long-Range Goal: Glycemic Management Optimized   Start Date: 06/17/2020  Expected End Date: 09/14/2020  This Visit's Progress: On track  Priority: High  Note:   CARE PLAN ENTRY Medicaid Managed Care (see longtitudinal plan of care for additional care plan information)  Objective:  Lab Results  Component Value Date   HGBA1C 8.8 (H) 03/11/2020 .   Lab Results  Component Value Date   CREATININE 0.53 06/09/2020   CREATININE 0.51 06/05/2020   CREATININE 0.51 06/02/2020   . Patient reported cbg findings: preprandial readings typically 90-100 unless during the weeks of chemo treatments, due to the steroids readings are higher around 200  Current Barriers:  Marland Kitchen Knowledge Deficits related to basic Diabetes pathophysiology and self care/management-Diamond Robbins has a recent diagnosis of Type 1 Diabetes Mellitis and Multiple Myeloma. Treatment for MM is 2 weeks on and 1 week off. During her treatment weeks her BS readings  are higher than usual due to her receiving steroids. She reports that at the time of diagnosis she received very helpful diabetic education. She is managing DM with diet and insulin. 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  Case Manager Clinical Goal(s):  Marland Kitchen Over the next 30 days, patient will demonstrate improved adherence to prescribed treatment plan for diabetes self care/management as evidenced by:  . daily monitoring and recording of CBG . adherence to ADA/ carb modified diet . exercise 3-5 days/week . adherence to prescribed medication regimen  Interventions:  . Provided education to patient about basic DM disease process . Reviewed medications with patient and discussed importance of medication adherence . Discussed plans with patient for ongoing care management follow up and provided patient with direct contact information for care management team . Reviewed scheduled/upcoming provider appointments  . Referral made to pharmacy team for assistance with medication management . Review of patient status, including review of consultants reports, relevant laboratory and other test results, and medications completed. . Encouraged patient to schedule a follow up with her PCP and for a routine eye exam  Patient Self Care Activities:  . - call to cancel if needed . - keep a  calendar with prescription refill dates . - keep a calendar with appointment dates  . - schedule a follow up appointment with PCP .  check blood sugar at prescribed times . - check blood sugar if I feel it is too high or too low . - enter blood sugar readings and medication or insulin into daily log . - take the blood sugar log to all doctor visits . - take the blood sugar meter to all doctor visits . - schedule appointment with eye doctor  Initial goal documentation

## 2020-06-17 NOTE — Telephone Encounter (Signed)
I attempted to reach Diamond Robbins to schedule her for a phone visit with the Managed Medicaid Pharmacist. I left my name and number for her to return my call. I will reach out again in the next 7-14 days if I do not hear back from her.

## 2020-06-18 ENCOUNTER — Encounter (HOSPITAL_COMMUNITY): Payer: Self-pay

## 2020-06-18 DIAGNOSIS — R000 Tachycardia, unspecified: Secondary | ICD-10-CM | POA: Diagnosis not present

## 2020-06-18 DIAGNOSIS — C9000 Multiple myeloma not having achieved remission: Secondary | ICD-10-CM | POA: Diagnosis not present

## 2020-06-18 DIAGNOSIS — Z9484 Stem cells transplant status: Secondary | ICD-10-CM | POA: Diagnosis not present

## 2020-06-18 DIAGNOSIS — Z01810 Encounter for preprocedural cardiovascular examination: Secondary | ICD-10-CM | POA: Diagnosis not present

## 2020-06-18 DIAGNOSIS — Z01818 Encounter for other preprocedural examination: Secondary | ICD-10-CM | POA: Diagnosis not present

## 2020-06-18 DIAGNOSIS — I517 Cardiomegaly: Secondary | ICD-10-CM | POA: Diagnosis not present

## 2020-06-18 NOTE — Progress Notes (Signed)
Notification received from [facility] that patient's tentative date of bone marrow transplant is 07/17/20, therefore asking that patient remain off of Revlimid. Dr. Delton Coombes aware and agreeable with request. Pharmacy aware that patient will be stopping Revlimid at this time.

## 2020-06-19 DIAGNOSIS — C9000 Multiple myeloma not having achieved remission: Secondary | ICD-10-CM | POA: Diagnosis not present

## 2020-06-19 DIAGNOSIS — Z01818 Encounter for other preprocedural examination: Secondary | ICD-10-CM | POA: Diagnosis not present

## 2020-06-22 ENCOUNTER — Inpatient Hospital Stay (HOSPITAL_COMMUNITY): Payer: Medicaid Other | Attending: Hematology

## 2020-06-22 ENCOUNTER — Other Ambulatory Visit: Payer: Self-pay

## 2020-06-22 DIAGNOSIS — E876 Hypokalemia: Secondary | ICD-10-CM | POA: Diagnosis not present

## 2020-06-22 DIAGNOSIS — I10 Essential (primary) hypertension: Secondary | ICD-10-CM | POA: Diagnosis not present

## 2020-06-22 DIAGNOSIS — C9000 Multiple myeloma not having achieved remission: Secondary | ICD-10-CM | POA: Diagnosis not present

## 2020-06-22 DIAGNOSIS — E1010 Type 1 diabetes mellitus with ketoacidosis without coma: Secondary | ICD-10-CM | POA: Diagnosis not present

## 2020-06-22 LAB — CBC WITH DIFFERENTIAL/PLATELET
Abs Immature Granulocytes: 0.02 10*3/uL (ref 0.00–0.07)
Basophils Absolute: 0.1 10*3/uL (ref 0.0–0.1)
Basophils Relative: 2 %
Eosinophils Absolute: 0.4 10*3/uL (ref 0.0–0.5)
Eosinophils Relative: 9 %
HCT: 46.9 % — ABNORMAL HIGH (ref 36.0–46.0)
Hemoglobin: 14.9 g/dL (ref 12.0–15.0)
Immature Granulocytes: 0 %
Lymphocytes Relative: 24 %
Lymphs Abs: 1.1 10*3/uL (ref 0.7–4.0)
MCH: 30.3 pg (ref 26.0–34.0)
MCHC: 31.8 g/dL (ref 30.0–36.0)
MCV: 95.5 fL (ref 80.0–100.0)
Monocytes Absolute: 0.4 10*3/uL (ref 0.1–1.0)
Monocytes Relative: 9 %
Neutro Abs: 2.5 10*3/uL (ref 1.7–7.7)
Neutrophils Relative %: 56 %
Platelets: 313 10*3/uL (ref 150–400)
RBC: 4.91 MIL/uL (ref 3.87–5.11)
RDW: 13 % (ref 11.5–15.5)
WBC: 4.5 10*3/uL (ref 4.0–10.5)
nRBC: 0 % (ref 0.0–0.2)

## 2020-06-22 LAB — COMPREHENSIVE METABOLIC PANEL
ALT: 24 U/L (ref 0–44)
AST: 17 U/L (ref 15–41)
Albumin: 4.4 g/dL (ref 3.5–5.0)
Alkaline Phosphatase: 111 U/L (ref 38–126)
Anion gap: 10 (ref 5–15)
BUN: 11 mg/dL (ref 6–20)
CO2: 21 mmol/L — ABNORMAL LOW (ref 22–32)
Calcium: 8.9 mg/dL (ref 8.9–10.3)
Chloride: 103 mmol/L (ref 98–111)
Creatinine, Ser: 0.46 mg/dL (ref 0.44–1.00)
GFR, Estimated: >60 mL/min (ref >60)
Glucose, Bld: 168 mg/dL — ABNORMAL HIGH (ref 70–99)
Potassium: 3.6 mmol/L (ref 3.5–5.1)
Sodium: 134 mmol/L — ABNORMAL LOW (ref 135–145)
Total Bilirubin: 0.5 mg/dL (ref 0.3–1.2)
Total Protein: 7.8 g/dL (ref 6.5–8.1)

## 2020-06-22 LAB — PREGNANCY, URINE: Preg Test, Ur: NEGATIVE

## 2020-06-23 ENCOUNTER — Inpatient Hospital Stay (HOSPITAL_BASED_OUTPATIENT_CLINIC_OR_DEPARTMENT_OTHER): Payer: Medicaid Other | Admitting: Hematology

## 2020-06-23 ENCOUNTER — Ambulatory Visit (HOSPITAL_COMMUNITY): Payer: Medicaid Other

## 2020-06-23 ENCOUNTER — Other Ambulatory Visit: Payer: Self-pay

## 2020-06-23 ENCOUNTER — Encounter (HOSPITAL_COMMUNITY): Payer: Self-pay | Admitting: Hematology

## 2020-06-23 VITALS — BP 128/95 | HR 93 | Temp 98.2°F | Resp 16 | Wt 162.8 lb

## 2020-06-23 DIAGNOSIS — E1010 Type 1 diabetes mellitus with ketoacidosis without coma: Secondary | ICD-10-CM | POA: Diagnosis not present

## 2020-06-23 DIAGNOSIS — C9000 Multiple myeloma not having achieved remission: Secondary | ICD-10-CM | POA: Diagnosis not present

## 2020-06-23 DIAGNOSIS — E876 Hypokalemia: Secondary | ICD-10-CM | POA: Diagnosis not present

## 2020-06-23 DIAGNOSIS — I10 Essential (primary) hypertension: Secondary | ICD-10-CM | POA: Diagnosis not present

## 2020-06-23 LAB — PROTEIN ELECTROPHORESIS, SERUM
A/G Ratio: 1.3 (ref 0.7–1.7)
Albumin ELP: 4.1 g/dL (ref 2.9–4.4)
Alpha-1-Globulin: 0.2 g/dL (ref 0.0–0.4)
Alpha-2-Globulin: 1 g/dL (ref 0.4–1.0)
Beta Globulin: 1.4 g/dL — ABNORMAL HIGH (ref 0.7–1.3)
Gamma Globulin: 0.6 g/dL (ref 0.4–1.8)
Globulin, Total: 3.2 g/dL (ref 2.2–3.9)
M-Spike, %: 0.2 g/dL — ABNORMAL HIGH
Total Protein ELP: 7.3 g/dL (ref 6.0–8.5)

## 2020-06-23 LAB — KAPPA/LAMBDA LIGHT CHAINS
Kappa free light chain: 9.6 mg/L (ref 3.3–19.4)
Kappa, lambda light chain ratio: 2.74 — ABNORMAL HIGH (ref 0.26–1.65)
Lambda free light chains: 3.5 mg/L — ABNORMAL LOW (ref 5.7–26.3)

## 2020-06-23 NOTE — Progress Notes (Unsigned)
Patient was assessed by Dr. Delton Coombes and labs have been reviewed.  Patient is okay to proceed with last darzalex today. Discontinuing other treatment for now due to upcoming transplant. Primary RN and pharmacy aware.

## 2020-06-23 NOTE — Patient Instructions (Signed)
[place] at [facility] Discharge Instructions  You were seen today by Dr. Delton Coombes. He went over your recent results. You received your final treatment, only the Darzalex Faspro, today. Stop taking the Revlimid. You may proceed with your transplant at [facility]. Dr. Delton Coombes will see you back after [facility] for follow up.   Thank you for choosing [place] at [facility] to provide your oncology and hematology care.  To afford each patient quality time with our provider, please arrive at least 15 minutes before your scheduled appointment time.   If you have a lab appointment with the [facility] please come in thru the Main Entrance and check in at the main information desk  You need to re-schedule your appointment should you arrive 10 or more minutes late.  We strive to give you quality time with our providers, and arriving late affects you and other patients whose appointments are after yours.  Also, if you no show three or more times for appointments you may be dismissed from the clinic at the providers discretion.     Again, thank you for choosing [facility].  Our hope is that these requests will decrease the amount of time that you wait before being seen by our physicians.       _____________________________________________________________  Should you have questions after your visit to [facility], please contact our office at [phone] between the hours of 8:00 a.m. and 4:30 p.m.  Voicemails left after 4:00 p.m. will not be returned until the following business day.  For prescription refill requests, have your pharmacy contact our office and allow 72 hours.    Cancer Center Support Programs:   > Cancer Support Group  2nd Tuesday of the month 1pm-2pm, Journey Room

## 2020-06-23 NOTE — Progress Notes (Signed)
Diamond Robbins, Diamond Robbins [id number]   CLINIC:  Medical Oncology/Hematology  PCP:  Patient, No Pcp Per None None   REASON FOR VISIT:  Follow-up for IgA kappa multiple myeloma  PRIOR THERAPY: None  NGS Results: Not done  CURRENT THERAPY: RVD and Darzalex Faspro on D1, 4, 8, 11, and 15; Revlimid 2 weeks on, 1 week off  BRIEF ONCOLOGIC HISTORY:  Oncology History  Multiple myeloma ([place])  03/09/2020 Initial Diagnosis   Multiple myeloma ([place])   03/31/2020 -  Chemotherapy    Patient is on Treatment Plan: MYELOMA RELAPSED / REFRACTORY DARATUMUMAB SQ + BORTEZOMIB + DEXAMETHASONE (DARAVD) Q21D / DARATUMUMAB SQ Q28D         CANCER STAGING: Cancer Staging No matching staging information was found for the patient.  INTERVAL HISTORY:  Diamond Robbins, a 30 y.o. female, returns for routine follow-up and consideration for final cycle of immunotherapy. Diamond Robbins was last seen on 06/09/2020.  Due for cycle #5 of Darzalex Faspro today.   Today she is accompanied by her fiance. Overall, she tells me she has been feeling pretty well. She met with Dr. Feliciana Rossetti and will start the transplant process on 03/03, which is slated to last 2 weeks. She tolerated the previous treatment well and denies having any SOB, abdominal pain, N/V/D, leg swelling, F/C or recent infections or ER visits. Her appetite is excellent. Dr. Feliciana Rossetti has already called the pharmacy to stop the Revlimid; she took her last dose on 01/31.  Overall, she feels ready for final cycle of immunotherapy today.    REVIEW OF SYSTEMS:  Review of Systems  Constitutional: Negative for appetite change, chills, fatigue and fever.  Respiratory: Negative for shortness of breath.   Cardiovascular: Negative for leg swelling.  Gastrointestinal: Negative for abdominal pain, nausea and vomiting.    PAST MEDICAL/SURGICAL HISTORY:  Past Medical History:  Diagnosis Date  . DKA (diabetic ketoacidosis) ([place])  02/23/2020  . Hypertension   . Type 1 diabetes mellitus ([place]) 02/23/2020   Past Surgical History:  Procedure Laterality Date  . NO PAST SURGERIES      SOCIAL HISTORY:  Social History   Socioeconomic History  . Marital status: Single    Spouse name: Not on file  . Number of children: Not on file  . Years of education: Not on file  . Highest education level: Not on file  Occupational History  . Occupation: unemployed  Tobacco Use  . Smoking status: Never Smoker  . Smokeless tobacco: Never Used  Vaping Use  . Vaping Use: Never used  Substance and Sexual Activity  . Alcohol use: Not Currently    Comment: occ  . Drug use: No  . Sexual activity: Yes    Birth control/protection: Condom  Other Topics Concern  . Not on file  Social History Narrative   Pt is engaged   Social Determinants of Health   Financial Resource Strain: Low Risk   . Difficulty of Paying Living Expenses: Not hard at all  Food Insecurity: No Food Insecurity  . Worried About Charity fundraiser in the Last Year: Never true  . Ran Out of Food in the Last Year: Never true  Transportation Needs: No Transportation Needs  . Lack of Transportation (Medical): No  . Lack of Transportation (Non-Medical): No  Physical Activity: Insufficiently Active  . Days of Exercise per Week: 3 days  . Minutes of Exercise per Session: 10 min  Stress: No Stress Concern Present  .  Feeling of Stress : Not at all  Social Connections: Moderately Integrated  . Frequency of Communication with Friends and Family: More than three times a week  . Frequency of Social Gatherings with Friends and Family: More than three times a week  . Attends Religious Services: More than 4 times per year  . Active Member of Clubs or Organizations: No  . Attends Archivist Meetings: Never  . Marital Status: Married  Human resources officer Violence: Not At Risk  . Fear of Current or Ex-Partner: No  . Emotionally Abused: No  . Physically Abused:  No  . Sexually Abused: No    FAMILY HISTORY:  Family History  Problem Relation Age of Onset  . Hypertension Mother   . Diabetes Mother   . Hypertension Father   . Diabetes Maternal Grandmother     CURRENT MEDICATIONS:  Current Outpatient Medications  Medication Sig Dispense Refill  . ACCU-CHEK GUIDE test strip     . Accu-Chek Softclix Lancets lancets 1 each 3 (three) times daily.    Marland Kitchen acyclovir (ZOVIRAX) 400 MG tablet Take 1 tablet (400 mg total) by mouth 2 (two) times daily. 60 tablet 6  . amLODipine (NORVASC) 10 MG tablet Take 10 mg by mouth daily.    Marland Kitchen aspirin EC 81 MG tablet Take 1 tablet (81 mg total) by mouth daily. Swallow whole. 30 tablet 11  . Blood Glucose Monitoring Suppl (ACCU-CHEK GUIDE) w/Device KIT     . bortezomib IV (VELCADE) 3.5 MG injection Inject 1.3 mg/m2 into the vein once.    . Daratumumab-Hyaluronidase-fihj (DARZALEX FASPRO [place]) Inject 1,800 mg into the skin.    Marland Kitchen LANTUS SOLOSTAR 100 UNIT/ML Solostar Pen Inject 25 Units into the skin at bedtime.     Marland Kitchen lenalidomide (REVLIMID) 20 MG capsule Take 1 capsule (20 mg total) by mouth daily. (Patient not taking: Reported on 06/23/2020) 14 capsule 0  . metoprolol tartrate (LOPRESSOR) 25 MG tablet Take 25 mg by mouth daily.     No current facility-administered medications for this visit.    ALLERGIES:  No Known Allergies  PHYSICAL EXAM:  Performance status (ECOG): 1 - Symptomatic but completely ambulatory  Vitals:   06/23/20 1229  BP: (!) 128/95  Pulse: 93  Resp: 16  Temp: 98.2 F (36.8 C)  SpO2: 100%   Wt Readings from Last 3 Encounters:  06/23/20 162 lb 12.8 oz (73.8 kg)  06/12/20 158 lb 12.8 oz (72 kg)  06/09/20 162 lb (73.5 kg)   Physical Exam Vitals reviewed.  Constitutional:      Appearance: Normal appearance.  Cardiovascular:     Rate and Rhythm: Normal rate and regular rhythm.     Pulses: Normal pulses.     Heart sounds: Normal heart sounds.  Pulmonary:     Effort: Pulmonary effort is  normal.     Breath sounds: Normal breath sounds.  Musculoskeletal:     Right lower leg: No edema.     Left lower leg: No edema.  Neurological:     General: No focal deficit present.     Mental Status: She is alert and oriented to person, place, and time.  Psychiatric:        Mood and Affect: Mood normal.        Behavior: Behavior normal.     LABORATORY DATA:  I have reviewed the labs as listed.  CBC Latest Ref Rng & Units 06/22/2020 06/09/2020 06/05/2020  WBC 4.0 - 10.5 K/uL 4.5 4.5 5.0  Hemoglobin 12.0 -  15.0 g/dL 14.9 13.1 13.9  Hematocrit 36.0 - 46.0 % 46.9(H) 39.9 42.1  Platelets 150 - 400 K/uL 313 281 367   CMP Latest Ref Rng & Units 06/22/2020 06/09/2020 06/05/2020  Glucose 70 - 99 mg/dL 168(H) 229(H) 164(H)  BUN 6 - 20 mg/dL 11 11 10   Creatinine 0.44 - 1.00 mg/dL 0.46 0.53 0.51  Sodium 135 - 145 mmol/L 134(L) 136 134(L)  Potassium 3.5 - 5.1 mmol/L 3.6 3.5 3.3(L)  Chloride 98 - 111 mmol/L 103 104 100  CO2 22 - 32 mmol/L 21(L) 24 24  Calcium 8.9 - 10.3 mg/dL 8.9 8.4(L) 8.6(L)  Total Protein 6.5 - 8.1 g/dL 7.8 6.3(L) 7.2  Total Bilirubin 0.3 - 1.2 mg/dL 0.5 0.5 0.4  Alkaline Phos 38 - 126 U/L 111 146(H) 166(H)  AST 15 - 41 U/L 17 12(L) 10(L)  ALT 0 - 44 U/L 24 15 15    Lab Results  Component Value Date   TOTALPROTELP 6.9 06/05/2020   ALBUMINELP 3.9 06/05/2020   A1GS 0.2 06/05/2020   A2GS 0.9 06/05/2020   BETS 1.3 06/05/2020   GAMS 0.6 06/05/2020   MSPIKE 0.2 (H) 06/05/2020   SPEI Comment 06/05/2020    Lab Results  Component Value Date   KPAFRELGTCHN 8.4 06/05/2020   LAMBDASER 3.1 (L) 06/05/2020   KAPLAMBRATIO 2.71 (H) 06/05/2020    DIAGNOSTIC IMAGING:  I have independently reviewed the scans and discussed with the patient. No results found.   ASSESSMENT:  1.Stage IIIIgA kappa multiple myeloma: -Presentation to [facility] in [place] with confusion and weakness. -Found to have diabetic ketoacidosis with newly diagnosed type 1  diabetes. -Also found to have acute renal failureand hypercalcemia. -Bone marrow biopsy on 02/25/2020 with flow cytometry with the population CD138 positive, CD56 positive IgA kappa monoclonal plasma cells 12%. 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 -Ultrasound abdomen on 02/23/2020 showed increased hepatic echogenicity with normal spleen. -Renal ultrasound on 02/24/2020 was normal. -CT chest on 02/22/2020 withmoth eatenappearance of the bones. -Beta-2 microglobulin 2.1. LDH 322. SPEP and immunofixation were normal. Kappa light chains are elevated at 36.8. Lambda light chains 12.9 and ratio of 2.85. -24-hour urine immunofixation was negative. M spike was negative. -PET scan on 03/23/2020 showed no FDG radiotracer activity, no soft tissue plasmacytoma. Diffuse multiple small lytic lesions within the pelvis, spine and vertebral bodies consistent with myeloma. -Chromosome analysis was 53, XX. Multiple myeloma FISH panel was normal. -She is considered high risk based on elevated LDH level. -Cycle 1 of Velcade, Revlimid, Darzalex and dexamethasone started on 03/31/2020.  2. 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 Non-smoker. -No family history of myeloma or other malignancies.   PLAN:  1.Stage IIIIgA kappa multiple myeloma: -She received cycle 4-day 11 on 06/12/2020. -He missed day 15 of daratumumab last week. -She was already evaluated by Dr. Feliciana [facility]setti at [facility] for [facility]. -She will have mobilization in 10 to 14 days.  Likely proceed with transplant on 07/16/2020. -I have reviewed her labs today which showed M spike is 0.2 g.  CBC was normal. -Kappa light chains are 9.6 and ratio is 2.74 with lambda light chains of 3.5.  Immunofixation showed IgG kappa consistent with daratumumab. -We have reached out to  transplant team at [facility].  Today we will hold off on day 15 dose of daratumumab.  Return to clinic after transplant.  2. Type I  diabetes: -Continue Lantus 25 units - 28 units at nighttime.  3. Hypertension: -Continue Norvasc and metoprolol.  This is well controlled.  4. Hypokalemia: -She is no longer requiring potassium supplements.  Potassium is 3.5 today.   Orders placed this encounter:  No orders of the defined types were placed in this encounter.    [name], MD [place] [phone]   I, Milinda Antis, am acting as a scribe for Dr. Sanda Linger.  I, [name] MD, have reviewed the above documentation for accuracy and completeness, and I agree with the above.

## 2020-06-24 ENCOUNTER — Telehealth: Payer: Self-pay | Admitting: General Practice

## 2020-06-24 LAB — IMMUNOFIXATION ELECTROPHORESIS
IgA: 12 mg/dL — ABNORMAL LOW (ref 87–352)
IgG (Immunoglobin G), Serum: 584 mg/dL — ABNORMAL LOW (ref 586–1602)
IgM (Immunoglobulin M), Srm: 27 mg/dL (ref 26–217)
Total Protein ELP: 7.4 g/dL (ref 6.0–8.5)

## 2020-06-24 NOTE — Telephone Encounter (Signed)
This was my 2nd attempt to reach Diamond Robbins to get her scheduled for a phone visit with the Managed [employer] Team;s Pharmacist. I left my contact info so she can call me back. I will reach out again in the next 7-14 days if I have not heard back from her.

## 2020-06-25 ENCOUNTER — Other Ambulatory Visit (HOSPITAL_COMMUNITY): Payer: Self-pay | Admitting: *Deleted

## 2020-06-25 MED ORDER — LANTUS SOLOSTAR 100 UNIT/ML ~~LOC~~ SOPN: 25.0000 [IU] | PEN_INJECTOR | Freq: Every evening | SUBCUTANEOUS | 0 refills | Status: DC

## 2020-06-26 ENCOUNTER — Ambulatory Visit (HOSPITAL_COMMUNITY): Payer: Medicaid Other

## 2020-06-26 ENCOUNTER — Other Ambulatory Visit (HOSPITAL_COMMUNITY): Payer: Medicaid Other

## 2020-06-30 ENCOUNTER — Ambulatory Visit (HOSPITAL_COMMUNITY): Payer: Medicaid Other

## 2020-06-30 ENCOUNTER — Other Ambulatory Visit: Payer: Medicaid Other

## 2020-06-30 ENCOUNTER — Other Ambulatory Visit (HOSPITAL_COMMUNITY): Payer: Medicaid Other

## 2020-07-03 ENCOUNTER — Ambulatory Visit (HOSPITAL_COMMUNITY): Payer: Medicaid Other

## 2020-07-14 ENCOUNTER — Other Ambulatory Visit (HOSPITAL_COMMUNITY): Payer: Self-pay

## 2020-07-14 DIAGNOSIS — C9000 Multiple myeloma not having achieved remission: Secondary | ICD-10-CM

## 2020-07-14 DIAGNOSIS — Z419 Encounter for procedure for purposes other than remedying health state, unspecified: Secondary | ICD-10-CM | POA: Diagnosis not present

## 2020-07-15 ENCOUNTER — Other Ambulatory Visit: Payer: Self-pay | Admitting: *Deleted

## 2020-07-15 ENCOUNTER — Other Ambulatory Visit: Payer: Self-pay

## 2020-07-15 NOTE — Patient Instructions (Signed)
Visit Information  Diamond Robbins was given information about Medicaid Managed Care team care coordination services as a part of their [facility] Medicaid benefit. Marka Treloar verbally consented to engagement with the [facility] Managed Care team.   For questions related to your [facility] health plan, please call: [phone]  If you would like to schedule transportation through your [facility] plan, please call the following number at least 2 days in advance of your appointment: [phone]   Ms. Gaetz - following are the goals we discussed in your visit today:  Goals Addressed            This Visit's Progress   . Make and Keep All Appointments       Timeframe:  Long-Range Goal Priority:  High Start Date:  06/17/20                           Expected End Date: 09/14/20                      Follow Up Date 08/18/20    - call to cancel if needed - keep a calendar with prescription refill dates - keep a calendar with appointment dates  - schedule a follow up appointment with PCP for evaluation of diabetes and HTN   Why is this important?    Part of staying healthy is seeing the doctor for follow-up care.   If you forget your appointments, there are some things you can do to stay on track.        . Monitor and Manage My Blood Sugar-Diabetes Type 1       Timeframe:  Long-Range Goal Priority:  High Start Date:   06/17/20                          Expected End Date:  09/14/20                     Follow Up Date 08/18/20   - check blood sugar at prescribed times - check blood sugar if I feel it is too high or too low - enter blood sugar readings and medication or insulin into daily log - take the blood sugar log to all doctor visits - take the blood sugar meter to all doctor visits - adhere to a diabetic diet   Why is this important?    Checking your blood sugar at home helps to keep it from getting very high or very low.   Writing the results in a diary or log helps the  doctor know how to care for you.   Your blood sugar log should have the time, the date and the results.   Also, write down the amount of insulin or other medicine you take.   Other information like what you ate, exercise done and how you were feeling will also be helpful..         . Obtain Eye Exam-Diabetes Type 1       Timeframe:  Short-Term Goal Priority:  Medium Start Date: 06/17/20                            Expected End Date:  09/14/20                     Follow Up Date 08/18/20   - schedule  appointment with eye doctor    Why is this important?    Eye check-ups are important when you have diabetes.   Vision loss can be prevented.           Please see education materials related to diabetic diet provided by MyChart link.    Diabetes Mellitus and Nutrition, Adult When you have diabetes, or diabetes mellitus, it is very important to have healthy eating habits because your blood sugar (glucose) levels are greatly affected by what you eat and drink. Eating healthy foods in the right amounts, at about the same times every day, can help you:  Control your blood glucose.  Lower your risk of heart disease.  Improve your blood pressure.  Reach or maintain a healthy weight. What can affect my meal plan? Every person with diabetes is different, and each person has different needs for a meal plan. Your health care provider may recommend that you work with a dietitian to make a meal plan that is best for you. Your meal plan may vary depending on factors such as:  The calories you need.  The medicines you take.  Your weight.  Your blood glucose, blood pressure, and cholesterol levels.  Your activity level.  Other health conditions you have, such as heart or kidney disease. How do carbohydrates affect me? Carbohydrates, also called carbs, affect your blood glucose level more than any other type of food. Eating carbs naturally raises the amount of glucose in your blood. Carb  counting is a method for keeping track of how many carbs you eat. Counting carbs is important to keep your blood glucose at a healthy level, especially if you use insulin or take certain oral diabetes medicines. It is important to know how many carbs you can safely have in each meal. This is different for every person. Your dietitian can help you calculate how many carbs you should have at each meal and for each snack. How does alcohol affect me? Alcohol can cause a sudden decrease in blood glucose (hypoglycemia), especially if you use insulin or take certain oral diabetes medicines. Hypoglycemia can be a life-threatening condition. Symptoms of hypoglycemia, such as sleepiness, dizziness, and confusion, are similar to symptoms of having too much alcohol.  Do not drink alcohol if: ? Your health care provider tells you not to drink. ? You are pregnant, may be pregnant, or are planning to become pregnant.  If you drink alcohol: ? Do not drink on an empty stomach. ? Limit how much you use to:  0-1 drink a day for women.  0-2 drinks a day for men. ? Be aware of how much alcohol is in your drink. In the [place] one drink equals one 12 oz bottle of beer (355 mL), one 5 oz glass of wine (148 mL), or one 1 oz glass of hard liquor (44 mL). ? Keep yourself hydrated with water, diet soda, or unsweetened iced tea.  Keep in mind that regular soda, juice, and other mixers may contain a lot of sugar and must be counted as carbs. What are tips for following this plan? Reading food labels  Start by checking the serving size on the "Nutrition Facts" label of packaged foods and drinks. The amount of calories, carbs, fats, and other nutrients listed on the label is based on one serving of the item. Many items contain more than one serving per package.  Check the total grams (g) of carbs in one serving. You can calculate the number of servings  of carbs in one serving by dividing the total carbs by 15. For example,  if a food has 30 g of total carbs per serving, it would be equal to 2 servings of carbs.  Check the number of grams (g) of saturated fats and trans fats in one serving. Choose foods that have a low amount or none of these fats.  Check the number of milligrams (mg) of salt (sodium) in one serving. Most people should limit total sodium intake to less than 2,300 mg per day.  Always check the nutrition information of foods labeled as "low-fat" or "nonfat." These foods may be higher in added sugar or refined carbs and should be avoided.  Talk to your dietitian to identify your daily goals for nutrients listed on the label. Shopping  Avoid buying canned, pre-made, or processed foods. These foods tend to be high in fat, sodium, and added sugar.  Shop around the outside edge of the grocery store. This is where you will most often find fresh fruits and vegetables, bulk grains, fresh meats, and fresh dairy. Cooking  Use low-heat cooking methods, such as baking, instead of high-heat cooking methods like deep frying.  Cook using healthy oils, such as olive, canola, or sunflower oil.  Avoid cooking with butter, cream, or high-fat meats. Meal planning  Eat meals and snacks regularly, preferably at the same times every day. Avoid going long periods of time without eating.  Eat foods that are high in fiber, such as fresh fruits, vegetables, beans, and whole grains. Talk with your dietitian about how many servings of carbs you can eat at each meal.  Eat 4-6 oz (112-168 g) of lean protein each day, such as lean meat, chicken, fish, eggs, or tofu. One ounce (oz) of lean protein is equal to: ? 1 oz (28 g) of meat, chicken, or fish. ? 1 egg. ?  cup (62 g) of tofu.  Eat some foods each day that contain healthy fats, such as avocado, nuts, seeds, and fish.   What foods should I eat? Fruits Berries. Apples. Oranges. Peaches. Apricots. Plums. Grapes. Mango. Papaya. Pomegranate. Kiwi.  Cherries. Vegetables Lettuce. Spinach. Leafy greens, including kale, chard, collard greens, and mustard greens. Beets. Cauliflower. Cabbage. Broccoli. Carrots. Green beans. Tomatoes. Peppers. Onions. Cucumbers. Brussels sprouts. Grains Whole grains, such as whole-wheat or whole-grain bread, crackers, tortillas, cereal, and pasta. Unsweetened oatmeal. Quinoa. Brown or wild rice. Meats and other proteins Seafood. Poultry without skin. Lean cuts of poultry and beef. Tofu. Nuts. Seeds. Dairy Low-fat or fat-free dairy products such as milk, yogurt, and cheese. The items listed above may not be a complete list of foods and beverages you can eat. Contact a dietitian for more information. What foods should I avoid? Fruits Fruits canned with syrup. Vegetables Canned vegetables. Frozen vegetables with butter or cream sauce. Grains Refined white flour and flour products such as bread, pasta, snack foods, and cereals. Avoid all processed foods. Meats and other proteins Fatty cuts of meat. Poultry with skin. Breaded or fried meats. Processed meat. Avoid saturated fats. Dairy Full-fat yogurt, cheese, or milk. Beverages Sweetened drinks, such as soda or iced tea. The items listed above may not be a complete list of foods and beverages you should avoid. Contact a dietitian for more information. Questions to ask a health care provider  Do I need to meet with a diabetes educator?  Do I need to meet with a dietitian?  What number can I call if I have questions?  When are  the best times to check my blood glucose? Where to find more information:  American Diabetes Association: diabetes.org  Academy of Nutrition and Dietetics: www.eatright.[employer] of Diabetes and Digestive and Kidney Diseases: [employer]  Association of Diabetes Care and Education Specialists: www.diabeteseducator.org Summary  It is important to have healthy eating habits because your blood sugar  (glucose) levels are greatly affected by what you eat and drink.  A healthy meal plan will help you control your blood glucose and maintain a healthy lifestyle.  Your health care provider may recommend that you work with a dietitian to make a meal plan that is best for you.  Keep in mind that carbohydrates (carbs) and alcohol have immediate effects on your blood glucose levels. It is important to count carbs and to use alcohol carefully. This information is not intended to replace advice given to you by your health care provider. Make sure you discuss any questions you have with your health care provider. Document Revised: 04/09/2019 Document Reviewed: 04/09/2019 Elsevier Patient Education  2021 [place].   Patient verbalizes understanding of instructions provided today.   Telephone follow up appointment with Managed Medicaid care management team member scheduled for:08/18/20 @ 10:30am  Melissa Montane, RN  Following is a copy of your plan of care:  Patient Care Plan: Diabetes Type 1 (Adult)    Problem Identified: Glycemic Management (Diabetes, Type 1)     Long-Range Goal: Glycemic Management Optimized   Start Date: 06/17/2020  Expected End Date: 09/14/2020  Recent Progress: On track  Priority: High  Note:   CARE PLAN ENTRY Medicaid Managed Care (see longtitudinal plan of care for additional care plan information)  Objective:  Lab Results  Component Value Date   HGBA1C 8.8 (H) 03/11/2020 .   Lab Results  Component Value Date   CREATININE 0.53 06/09/2020   CREATININE 0.51 06/05/2020   CREATININE 0.51 06/02/2020   . Patient reported cbg findings: postprandial readings typically 90-140  Current Barriers:  Marland Kitchen Knowledge Deficits related to basic Diabetes pathophysiology and self care/management-Ms Whitehead has a recent diagnosis of Type 1 Diabetes Mellitis and Multiple Myeloma. Treatment for MM is 2 weeks on and 1 week off. During her treatment weeks her BS readings are higher  than usual due to her receiving steroids. She reports that at the time of diagnosis she received very helpful diabetic education. She is managing DM with diet and insulin. She is scheduled for a bone marrow transplant at [facility] on 07/16/20.- Update- Due to a +COVID test on 2/14, bone marrow transplant will be rescheduled. Ms. Reichard will go tomorrow for labs and then her transplant will be rescheduled. She reports postprandial BS readings between 90-140. She feels like she is still learning how to eat. Ms. Cedar is feeling good and reports improvement after a mild case of COVID.  Case Manager Clinical Goal(s):  Marland Kitchen Over the next 30 days, patient will demonstrate improved adherence to prescribed treatment plan for diabetes self care/management as evidenced by:  . daily monitoring and recording of CBG . adherence to ADA/ carb modified diet . exercise 3-5 days/week . adherence to prescribed medication regimen  Interventions:  . Provided education to patient about basic DM diet . Reviewed medications with patient and discussed importance of medication adherence . Discussed plans with patient for ongoing care management follow up and provided patient with direct contact information for care management team . Reviewed scheduled/upcoming provider appointments  . Referral made to pharmacy team for assistance with  medication management-explained that she will receive a call from [facility] for scheduling . Review of patient status, including review of consultants reports, relevant laboratory and other test results, and medications completed. . Encouraged patient to schedule a follow up with her PCP and for a routine eye exam . Discussed plans for bone marrow transplant verified that Ms. Sheldon had contact information for her Navigator at [facility] should any questions arise   Patient Self Care Activities:  - call to cancel if needed - keep a calendar with prescription refill dates - keep a calendar with  appointment dates  - schedule a follow up appointment with PCP for evaluation of diabetes and HTN - check blood sugar at prescribed times - check blood sugar if I feel it is too high or too low - enter blood sugar readings and medication or insulin into daily log - take the blood sugar log to all doctor visits - take the blood sugar meter to all doctor visits - adhere to a diabetic diet - schedule appointment with eye doctor   Please see past updates related to this goal by clicking on the "Past Updates" button in the selected goal

## 2020-07-15 NOTE — Patient Outreach (Signed)
Medicaid Managed Care   Nurse Care Manager Note  07/15/2020 Name:  Reign Dziuba MRN:  [id number] DOB:  1991/04/19  Riannah Stagner is an 30 y.o. year old female who is a primary patient of Patient, No Pcp Per.  The [facility] Managed Care Coordination team was consulted for assistance with:    HTN DMI multiple myeloma  Ms. Sagraves was given information about Medicaid Managed Care Coordination team services today. [name] agreed to services and verbal consent obtained.  Engaged with patient by telephone for follow up visit in response to provider referral for case management and/or care coordination services.   Assessments/Interventions:  Review of past medical history, allergies, medications, health status, including review of consultants reports, laboratory and other test data, was performed as part of comprehensive evaluation and provision of chronic care management services.  SDOH (Social Determinants of Health) assessments and interventions performed:   Care Plan  No Known Allergies  Medications Reviewed Today    Reviewed by Melissa Montane, RN (Registered Nurse) on 07/15/20 at [phone]  Med List Status: <None>  Medication Order Taking? Sig Documenting Provider Last Dose Status Informant  ACCU-CHEK GUIDE test strip [id number] Yes  [provider] Taking Active Self  Accu-Chek Softclix Lancets lancets [id number] Yes 1 each 3 (three) times daily. [provider] Taking Active Self  acyclovir (ZOVIRAX) 400 MG tablet [id number] Yes Take 1 tablet (400 mg total) by mouth 2 (two) times daily. [name], MD Taking Active Self  amLODipine (NORVASC) 10 MG tablet [id number] Yes Take 10 mg by mouth daily. [provider] Taking Active Self  aspirin EC 81 MG tablet [id number] Yes Take 1 tablet (81 mg total) by mouth daily. Swallow whole. [name], MD Taking Active Self  Blood Glucose Monitoring Suppl (ACCU-CHEK GUIDE) w/Device Drucie Opitz [id number] Yes   [provider] Taking Active Self  bortezomib IV (VELCADE) 3.5 MG injection [id number] Yes Inject 1.3 mg/m2 into the vein once. [provider] Taking Active Self           Med Note (ROBB, MELANIE A   Wed Jun 17, 2020  9:12 AM) Frances Maywood at the hospital  Daratumumab-Hyaluronidase-fihj [facility]) [id number] Yes Inject 1,800 mg into the skin. [provider] Taking Active Self           Med Note (ROBB, MELANIE A   Wed Jun 17, 2020  9:12 AM) Frances Maywood at the hospital  LANTUS SOLOSTAR 100 UNIT/ML Solostar Pen [id number] Yes Inject 25 Units into the skin at bedtime. [name], MD Taking Active   lenalidomide (REVLIMID) 20 MG capsule [id number] No Take 1 capsule (20 mg total) by mouth daily.  Patient not taking: No sig reported   [name], MD Not Taking Active            Med Note (ROBB, MELANIE A   Wed Jul 15, 2020  9:09 AM) On hold  metoprolol tartrate (LOPRESSOR) 25 MG tablet [id number] Yes Take 25 mg by mouth daily. [provider] Taking Active Self  Med List Note [name], CPhT 03/07/13 1442): No preferred pharmacy.           Patient Active Problem List   Diagnosis Date Noted  . T1DM (type 1 diabetes mellitus) ([place]) 06/11/2020  . Multiple myeloma ([place]) 03/09/2020  . Goals of care, counseling/discussion 03/09/2020  . Pregnancy of unknown anatomic location 05/27/2019  . Chronic hypertension 05/24/2019    Conditions to be addressed/monitored per PCP order:  DMI and multiple myeloma  Care Plan : Diabetes Type 1 (Adult)  Updates made by Melissa Montane, RN since 07/15/2020 12:00 AM    Problem: Glycemic Management (Diabetes, Type 1)     Long-Range Goal: Glycemic Management Optimized   Start Date: 06/17/2020  Expected End Date: 09/14/2020  Recent Progress: On track  Priority: High  Note:   CARE PLAN ENTRY Medicaid Managed Care (see longtitudinal plan of care for additional care plan information)  Objective:  Lab  Results  Component Value Date   HGBA1C 8.8 (H) 03/11/2020 .   Lab Results  Component Value Date   CREATININE 0.53 06/09/2020   CREATININE 0.51 06/05/2020   CREATININE 0.51 06/02/2020   . Patient reported cbg findings: postprandial readings typically 90-140  Current Barriers:  Marland Kitchen Knowledge Deficits related to basic Diabetes pathophysiology and self care/management-Ms Eckstein has a recent diagnosis of Type 1 Diabetes Mellitis and Multiple Myeloma. Treatment for MM is 2 weeks on and 1 week off. During her treatment weeks her BS readings are higher than usual due to her receiving steroids. She reports that at the time of diagnosis she received very helpful diabetic education. She is managing DM with diet and insulin. She is scheduled for a bone marrow transplant at [facility] on 07/16/20.- Update- Due to a +COVID test on 2/14, bone marrow transplant will be rescheduled. Ms. Bhullar will go tomorrow for labs and then her transplant will be rescheduled. She reports postprandial BS readings between 90-140. She feels like she is still learning how to eat. Ms. Hannis is feeling good and reports improvement after a mild case of COVID.  Case Manager Clinical Goal(s):  Marland Kitchen Over the next 30 days, patient will demonstrate improved adherence to prescribed treatment plan for diabetes self care/management as evidenced by:  . daily monitoring and recording of CBG . adherence to ADA/ carb modified diet . exercise 3-5 days/week . adherence to prescribed medication regimen  Interventions:  . Provided education to patient about basic DM diet . Reviewed medications with patient and discussed importance of medication adherence . Discussed plans with patient for ongoing care management follow up and provided patient with direct contact information for care management team . Reviewed scheduled/upcoming provider appointments  . Referral made to pharmacy team for assistance with medication management-explained that she  will receive a call from [facility] for scheduling . Review of patient status, including review of consultants reports, relevant laboratory and other test results, and medications completed. . Encouraged patient to schedule a follow up with her PCP and for a routine eye exam . Discussed plans for bone marrow transplant verified that Ms. Whitaker had contact information for her Navigator at [facility] should any questions arise   Patient Self Care Activities:  - call to cancel if needed - keep a calendar with prescription refill dates - keep a calendar with appointment dates  - schedule a follow up appointment with PCP for evaluation of diabetes and HTN - check blood sugar at prescribed times - check blood sugar if I feel it is too high or too low - enter blood sugar readings and medication or insulin into daily log - take the blood sugar log to all doctor visits - take the blood sugar meter to all doctor visits - adhere to a diabetic diet - schedule appointment with eye doctor   Please see past updates related to this goal by clicking on the "Past Updates" button in the selected goal      Follow Up:  Patient agrees to  Care Plan and Follow-up.  Plan: The Managed Medicaid care management team will reach out to the patient again over the next 30 days.  Date/time of next scheduled RN care management/care coordination outreach: 08/18/20 @ 10:30am  Lurena Joiner RN, BSN [place] RN Care Coordinator

## 2020-07-20 ENCOUNTER — Other Ambulatory Visit: Payer: Self-pay

## 2020-07-20 ENCOUNTER — Inpatient Hospital Stay (HOSPITAL_COMMUNITY): Payer: Medicaid Other | Attending: Hematology

## 2020-07-20 DIAGNOSIS — C9000 Multiple myeloma not having achieved remission: Secondary | ICD-10-CM | POA: Diagnosis not present

## 2020-07-20 DIAGNOSIS — Z452 Encounter for adjustment and management of vascular access device: Secondary | ICD-10-CM | POA: Insufficient documentation

## 2020-07-21 LAB — IGG, IGA, IGM
IgA: 14 mg/dL — ABNORMAL LOW (ref 87–352)
IgG (Immunoglobin G), Serum: 452 mg/dL — ABNORMAL LOW (ref 586–1602)
IgM (Immunoglobulin M), Srm: 18 mg/dL — ABNORMAL LOW (ref 26–217)

## 2020-07-21 LAB — KAPPA/LAMBDA LIGHT CHAINS
Kappa free light chain: 12.5 mg/L (ref 3.3–19.4)
Kappa, lambda light chain ratio: 3.91 — ABNORMAL HIGH (ref 0.26–1.65)
Lambda free light chains: 3.2 mg/L — ABNORMAL LOW (ref 5.7–26.3)

## 2020-07-22 LAB — IMMUNOFIXATION ELECTROPHORESIS
IgA: 15 mg/dL — ABNORMAL LOW (ref 87–352)
IgG (Immunoglobin G), Serum: 480 mg/dL — ABNORMAL LOW (ref 586–1602)
IgM (Immunoglobulin M), Srm: 19 mg/dL — ABNORMAL LOW (ref 26–217)
Total Protein ELP: 6.5 g/dL (ref 6.0–8.5)

## 2020-07-22 LAB — PROTEIN ELECTROPHORESIS, SERUM
A/G Ratio: 1.8 — ABNORMAL HIGH (ref 0.7–1.7)
Albumin ELP: 4.2 g/dL (ref 2.9–4.4)
Alpha-1-Globulin: 0.2 g/dL (ref 0.0–0.4)
Alpha-2-Globulin: 0.8 g/dL (ref 0.4–1.0)
Beta Globulin: 1.1 g/dL (ref 0.7–1.3)
Gamma Globulin: 0.3 g/dL — ABNORMAL LOW (ref 0.4–1.8)
Globulin, Total: 2.3 g/dL (ref 2.2–3.9)
Total Protein ELP: 6.5 g/dL (ref 6.0–8.5)

## 2020-07-23 DIAGNOSIS — Z7952 Long term (current) use of systemic steroids: Secondary | ICD-10-CM | POA: Diagnosis not present

## 2020-07-23 DIAGNOSIS — R7402 Elevation of levels of lactic acid dehydrogenase (LDH): Secondary | ICD-10-CM | POA: Diagnosis not present

## 2020-07-23 DIAGNOSIS — Z7901 Long term (current) use of anticoagulants: Secondary | ICD-10-CM | POA: Diagnosis not present

## 2020-07-23 DIAGNOSIS — Z79899 Other long term (current) drug therapy: Secondary | ICD-10-CM | POA: Diagnosis not present

## 2020-07-23 DIAGNOSIS — Z52011 Autologous donor, stem cells: Secondary | ICD-10-CM | POA: Diagnosis not present

## 2020-07-23 DIAGNOSIS — C9000 Multiple myeloma not having achieved remission: Secondary | ICD-10-CM | POA: Diagnosis not present

## 2020-07-23 DIAGNOSIS — Z794 Long term (current) use of insulin: Secondary | ICD-10-CM | POA: Diagnosis not present

## 2020-07-23 DIAGNOSIS — Z7982 Long term (current) use of aspirin: Secondary | ICD-10-CM | POA: Diagnosis not present

## 2020-07-23 DIAGNOSIS — Z8616 Personal history of COVID-19: Secondary | ICD-10-CM | POA: Diagnosis not present

## 2020-07-23 DIAGNOSIS — Z01818 Encounter for other preprocedural examination: Secondary | ICD-10-CM | POA: Diagnosis not present

## 2020-07-23 DIAGNOSIS — Z5111 Encounter for antineoplastic chemotherapy: Secondary | ICD-10-CM | POA: Diagnosis not present

## 2020-07-28 DIAGNOSIS — U071 COVID-19: Secondary | ICD-10-CM | POA: Diagnosis not present

## 2020-07-28 DIAGNOSIS — Z7901 Long term (current) use of anticoagulants: Secondary | ICD-10-CM | POA: Diagnosis not present

## 2020-07-28 DIAGNOSIS — Z794 Long term (current) use of insulin: Secondary | ICD-10-CM | POA: Diagnosis not present

## 2020-07-28 DIAGNOSIS — Z888 Allergy status to other drugs, medicaments and biological substances status: Secondary | ICD-10-CM | POA: Diagnosis not present

## 2020-07-28 DIAGNOSIS — Z9484 Stem cells transplant status: Secondary | ICD-10-CM | POA: Insufficient documentation

## 2020-07-28 DIAGNOSIS — Z52011 Autologous donor, stem cells: Secondary | ICD-10-CM | POA: Diagnosis not present

## 2020-07-28 DIAGNOSIS — C9000 Multiple myeloma not having achieved remission: Secondary | ICD-10-CM | POA: Diagnosis not present

## 2020-07-28 DIAGNOSIS — Z7952 Long term (current) use of systemic steroids: Secondary | ICD-10-CM | POA: Diagnosis not present

## 2020-07-28 DIAGNOSIS — Z7982 Long term (current) use of aspirin: Secondary | ICD-10-CM | POA: Diagnosis not present

## 2020-07-28 DIAGNOSIS — Z20822 Contact with and (suspected) exposure to covid-19: Secondary | ICD-10-CM | POA: Diagnosis not present

## 2020-07-28 DIAGNOSIS — Z79899 Other long term (current) drug therapy: Secondary | ICD-10-CM | POA: Diagnosis not present

## 2020-07-29 DIAGNOSIS — Z52011 Autologous donor, stem cells: Secondary | ICD-10-CM | POA: Diagnosis not present

## 2020-07-29 DIAGNOSIS — C9000 Multiple myeloma not having achieved remission: Secondary | ICD-10-CM | POA: Diagnosis not present

## 2020-08-01 ENCOUNTER — Encounter (HOSPITAL_COMMUNITY): Payer: Self-pay | Admitting: Emergency Medicine

## 2020-08-01 ENCOUNTER — Other Ambulatory Visit: Payer: Self-pay

## 2020-08-01 ENCOUNTER — Emergency Department (HOSPITAL_COMMUNITY)
Admission: EM | Admit: 2020-08-01 | Discharge: 2020-08-01 | Disposition: A | Discharge status: Home / Self Care | Payer: Medicaid Other | Attending: Emergency Medicine | Admitting: Emergency Medicine

## 2020-08-01 ENCOUNTER — Emergency Department (HOSPITAL_COMMUNITY): Payer: Medicaid Other

## 2020-08-01 DIAGNOSIS — I10 Essential (primary) hypertension: Secondary | ICD-10-CM | POA: Diagnosis not present

## 2020-08-01 DIAGNOSIS — E1010 Type 1 diabetes mellitus with ketoacidosis without coma: Secondary | ICD-10-CM | POA: Diagnosis not present

## 2020-08-01 DIAGNOSIS — Z7982 Long term (current) use of aspirin: Secondary | ICD-10-CM | POA: Diagnosis not present

## 2020-08-01 DIAGNOSIS — C801 Malignant (primary) neoplasm, unspecified: Secondary | ICD-10-CM

## 2020-08-01 DIAGNOSIS — T829XXA Unspecified complication of cardiac and vascular prosthetic device, implant and graft, initial encounter: Secondary | ICD-10-CM

## 2020-08-01 DIAGNOSIS — Z452 Encounter for adjustment and management of vascular access device: Secondary | ICD-10-CM | POA: Diagnosis not present

## 2020-08-01 DIAGNOSIS — Z79899 Other long term (current) drug therapy: Secondary | ICD-10-CM | POA: Insufficient documentation

## 2020-08-01 DIAGNOSIS — T82898A Other specified complication of vascular prosthetic devices, implants and grafts, initial encounter: Secondary | ICD-10-CM | POA: Diagnosis not present

## 2020-08-01 HISTORY — DX: Malignant (primary) neoplasm, unspecified: C80.1

## 2020-08-01 NOTE — Discharge Instructions (Addendum)
Call your provider at [facility] on Monday.  Return to the emergency department if you have any worsening symptoms.

## 2020-08-01 NOTE — ED Triage Notes (Signed)
Pt had [facility] line placed at [facility] 2 days ago for bone marrow transplant. Pt noticed excess bleeding from site on 07/31/20 around lunch time. Became concerned this date about amount of blood at site and came to get it checked.

## 2020-08-01 NOTE — ED Provider Notes (Signed)
[facility] EMERGENCY DEPARTMENT Provider Note   CSN: [id number] Arrival date & time: 08/01/20  2947     History Chief Complaint  Patient presents with  . Wound Check    Diamond Robbins is a 30 y.o. female.  HPI      Diamond Robbins is a 30 y.o. female with history of multiple myeloma, type 1 diabetes and HTN who presents to the Emergency Department requesting evaluation of bleeding from a right-sided central venous catheter site.  Catheter was placed on 07/28/2020 at [facility].  Catheter was accessed the following day and patient noticed some bleeding under the bandage yesterday.  She believes that she may have slept on her right side which may have precipitated the bleeding.  She denies pain, swelling, redness fever or chills.    Past Medical History:  Diagnosis Date  . DKA (diabetic ketoacidosis) ([place]) 02/23/2020  . Hypertension   . Type 1 diabetes mellitus ([place]) 02/23/2020    Patient Active Problem List   Diagnosis Date Noted  . T1DM (type 1 diabetes mellitus) ([place]) 06/11/2020  . Multiple myeloma ([place]) 03/09/2020  . Goals of care, counseling/discussion 03/09/2020  . Pregnancy of unknown anatomic location 05/27/2019  . Chronic hypertension 05/24/2019    Past Surgical History:  Procedure Laterality Date  . NO PAST SURGERIES       OB History    Gravida  1   Para  0   Term  0   Preterm  0   AB  0   Living  0     SAB  0   IAB  0   Ectopic  0   Multiple  0   Live Births  0           Family History  Problem Relation Age of Onset  . Hypertension Mother   . Diabetes Mother   . Hypertension Father   . Diabetes Maternal Grandmother     Social History   Tobacco Use  . Smoking status: Never Smoker  . Smokeless tobacco: Never Used  Vaping Use  . Vaping Use: Never used  Substance Use Topics  . Alcohol use: Not Currently    Comment: occ  . Drug use: No    Home Medications Prior to Admission medications   Medication Sig Start Date End  Date Taking? Authorizing Provider  ACCU-CHEK GUIDE test strip  03/08/20   [provider]  Accu-Chek Softclix Lancets lancets 1 each 3 (three) times daily. 03/08/20   [provider]  acyclovir (ZOVIRAX) 400 MG tablet Take 1 tablet (400 mg total) by mouth 2 (two) times daily. 03/25/20   [name], MD  amLODipine (NORVASC) 10 MG tablet Take 10 mg by mouth daily. 03/06/20   [provider]  aspirin EC 81 MG tablet Take 1 tablet (81 mg total) by mouth daily. Swallow whole. 03/25/20   [name], MD  Blood Glucose Monitoring Suppl (ACCU-CHEK GUIDE) w/Device KIT  03/08/20   [provider]  bortezomib IV (VELCADE) 3.5 MG injection Inject 1.3 mg/m2 into the vein once.    [provider]  Daratumumab-Hyaluronidase-fihj (DARZALEX FASPRO [place]) Inject 1,800 mg into the skin.    [provider]  LANTUS SOLOSTAR 100 UNIT/ML Solostar Pen Inject 25 Units into the skin at bedtime. 06/25/20   [name], MD  lenalidomide (REVLIMID) 20 MG capsule Take 1 capsule (20 mg total) by mouth daily. Patient not taking: No sig reported 05/28/20   [name], MD  metoprolol tartrate (LOPRESSOR)  25 MG tablet Take 25 mg by mouth daily. 03/06/20   [provider]    Allergies    Patient has no known allergies.  Review of Systems   Review of Systems  Constitutional: Negative for chills, fatigue and fever.  Respiratory: Negative for chest tightness and shortness of breath.   Cardiovascular: Negative for chest pain and palpitations.  Gastrointestinal: Negative for abdominal pain, nausea and vomiting.  Musculoskeletal: Negative for arthralgias, neck pain and neck stiffness.  Skin: Negative for color change and rash.  Neurological: Negative for dizziness, weakness and numbness.  Hematological: Does not bruise/bleed easily.    Physical Exam Updated Vital Signs There were no vitals taken for this visit.  Physical  Exam Vitals and nursing note reviewed.  Constitutional:      General: She is not in acute distress.    Appearance: Normal appearance. She is not toxic-appearing.  HENT:     Head: Normocephalic.  Neck:     Thyroid: No thyromegaly.     Meningeal: Kernig's sign absent.  Cardiovascular:     Rate and Rhythm: Normal rate and regular rhythm.     Pulses: Normal pulses.     Comments: Patient with right-sided central venous catheter.  Mild to moderate amount of old appearing blood at the catheter site.  No edema, erythema or purulent drainage.  Does not appear to be actively bleeding. Pulmonary:     Effort: Pulmonary effort is normal. No respiratory distress.     Breath sounds: Normal breath sounds.  Musculoskeletal:        General: Normal range of motion.     Cervical back: Normal range of motion and neck supple. No tenderness.  Skin:    General: Skin is warm.     Capillary Refill: Capillary refill takes less than 2 seconds.     Findings: No rash.  Neurological:     Mental Status: She is alert and oriented to person, place, and time.     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 Patient complains of excessive bleeding from the central venous line site yesterday. EXAM: PORTABLE CHEST 1 VIEW COMPARISON:  None. FINDINGS: The heart size and mediastinal contours are within normal limits. Dual lumen right central venous line is identified with distal tips in the superior vena cava/right atrium. Both lungs are clear. The visualized skeletal structures are unremarkable. IMPRESSION: No active disease. Electronically Signed   By: Abelardo Diesel M.D.   On: 08/01/2020 10:56    Procedures Procedures   Medications Ordered in  ED Medications - No data to display  ED Course  I have reviewed the triage vital signs and the nursing notes.  Pertinent labs & imaging results that were available during my care of the patient were reviewed by me and considered in my medical decision making (see chart for details).    MDM Rules/Calculators/A&P                          Patient here for evaluation of bleeding from a central line that was placed on 07/28/2020.  Catheter was accessed the following day.  Noticed bleeding from the site yesterday.  Believes she may have aggravated the  catheter by sleeping on her right side.  Dressing was removed by nursing staff.  Old blood present, but no active bleeding from the site.  New dressing was placed.  Patient was observed in the department without further bleeding.  Chest x-ray shows proper placement of central venous line.  Patient is well-appearing no other complaints.  Feel she is appropriate for discharge home.  Agrees to follow-up with her provider at [facility].  Return precautions discussed.    Final Clinical Impression(s) / ED Diagnoses Final diagnoses:  Complication of central venous catheter, initial encounter    Rx / DC Orders ED Discharge Orders    None       Kem Parkinson, PA-C 08/01/20 1322    Hayden Rasmussen, MD 08/01/20 1728

## 2020-08-03 ENCOUNTER — Telehealth: Payer: Self-pay

## 2020-08-03 NOTE — Telephone Encounter (Signed)
Transition Care Management Follow-up Telephone Call  Date of discharge and from where: 08/01/2020 from [facility].  How have you been since you were released from the hospital? Pt states that she is feeling well and has no questions or concerns.   Any questions or concerns? No  Items Reviewed:  Did the pt receive and understand the discharge instructions provided? Yes   Medications obtained and verified? Yes   Other? No   Any new allergies since your discharge? No   Dietary orders reviewed? n/a  Do you have support at home? Yes   Functional Questionnaire: (I = Independent and D = Dependent) ADLs: I  Bathing/Dressing- I  Meal Prep- I  Eating- I  Maintaining continence- I  Transferring/Ambulation- I  Managing Meds- I   Follow up appointments reviewed:   [facility] f/u appt confirmed? Yes  Scheduled to see [facility] on 08/06/2020 at 11:00am.  Are transportation arrangements needed? No   If their condition worsens, is the pt aware to call PCP or go to the Emergency Dept.? Yes  Was the patient provided with contact information for the PCP's office or ED? Yes  Was to pt encouraged to call back with questions or concerns? Yes

## 2020-08-05 ENCOUNTER — Other Ambulatory Visit (HOSPITAL_COMMUNITY): Payer: Self-pay

## 2020-08-05 MED ORDER — METOPROLOL TARTRATE 25 MG PO TABS: 25.0000 mg | ORAL_TABLET | Freq: Every day | ORAL | 1 refills | Status: DC

## 2020-08-05 MED ORDER — AMLODIPINE BESYLATE 10 MG PO TABS: 10.0000 mg | ORAL_TABLET | Freq: Every day | ORAL | 1 refills | Status: DC

## 2020-08-07 ENCOUNTER — Other Ambulatory Visit: Payer: Self-pay

## 2020-08-07 ENCOUNTER — Inpatient Hospital Stay (HOSPITAL_COMMUNITY): Payer: Medicaid Other

## 2020-08-07 DIAGNOSIS — C9000 Multiple myeloma not having achieved remission: Secondary | ICD-10-CM | POA: Diagnosis not present

## 2020-08-07 DIAGNOSIS — Z452 Encounter for adjustment and management of vascular access device: Secondary | ICD-10-CM | POA: Diagnosis not present

## 2020-08-07 MED ORDER — SODIUM CHLORIDE 0.9% FLUSH
10.0000 mL | INTRAVENOUS | Status: DC | PRN
  Administered 2020-08-07: 10 mL via INTRAVENOUS

## 2020-08-07 MED ORDER — HEPARIN SOD (PORK) LOCK FLUSH 100 UNIT/ML IV SOLN
500.0000 [IU] | Freq: Once | INTRAVENOUS | Status: AC
  Administered 2020-08-07: 500 [IU] via INTRAVENOUS

## 2020-08-07 NOTE — Progress Notes (Signed)
Patient presented today for a Hickman catheter dressing change and flush.  Patient tolerated dressing change and flush well.  Good blood return noted.  Patient remained stable during procedure and was discharged ambulatory and stable.

## 2020-08-14 ENCOUNTER — Other Ambulatory Visit: Payer: Self-pay

## 2020-08-14 ENCOUNTER — Inpatient Hospital Stay (HOSPITAL_COMMUNITY): Payer: Medicaid Other | Attending: Hematology

## 2020-08-14 DIAGNOSIS — C9000 Multiple myeloma not having achieved remission: Secondary | ICD-10-CM | POA: Diagnosis not present

## 2020-08-14 DIAGNOSIS — Z452 Encounter for adjustment and management of vascular access device: Secondary | ICD-10-CM | POA: Insufficient documentation

## 2020-08-14 DIAGNOSIS — Z79899 Other long term (current) drug therapy: Secondary | ICD-10-CM | POA: Diagnosis not present

## 2020-08-14 DIAGNOSIS — Z419 Encounter for procedure for purposes other than remedying health state, unspecified: Secondary | ICD-10-CM | POA: Diagnosis not present

## 2020-08-14 MED ORDER — SODIUM CHLORIDE 0.9% FLUSH
10.0000 mL | INTRAVENOUS | Status: DC | PRN
  Administered 2020-08-14: 10 mL via INTRAVENOUS

## 2020-08-14 MED ORDER — HEPARIN SOD (PORK) LOCK FLUSH 100 UNIT/ML IV SOLN
500.0000 [IU] | Freq: Once | INTRAVENOUS | Status: AC
  Administered 2020-08-14: 500 [IU] via INTRAVENOUS

## 2020-08-14 NOTE — Progress Notes (Signed)
Patient presents today for a Trifusion Hickman flush and dressing change.  Vitals signs stable prior to procedure.  All three lumens were flushed with 250 units of heparin and 20 mL of saline per orders provided.  Good blood return noted.  Patient tolerated procedure well.  Patient left ambulatory in stable condition.

## 2020-08-18 ENCOUNTER — Other Ambulatory Visit: Payer: Self-pay

## 2020-08-18 ENCOUNTER — Other Ambulatory Visit: Payer: Self-pay | Admitting: *Deleted

## 2020-08-18 NOTE — Patient Instructions (Signed)
Visit Information  Diamond Robbins was given information about Medicaid Managed Care team care coordination services as a part of their [facility] Medicaid benefit. Diamond Robbins verbally consented to engagement with the [facility] Managed Care team.   For questions related to your [facility] health plan, please call: [phone]  If you would like to schedule transportation through your [facility] plan, please call the following number at least 2 days in advance of your appointment: [phone]   Call the [place] at [phone], at any time, 24 hours a day, 7 days a week. If you are in danger or need immediate medical attention call 911.  Diamond Robbins - following are the goals we discussed in your visit today:  Goals Addressed            This Visit's Progress   . Make and Keep All Appointments       Timeframe:  Long-Range Goal Priority:  High Start Date:  06/17/20                           Expected End Date: 10/06/20                      Follow Up Date 10/06/20    - call to cancel if needed - keep a calendar with prescription refill dates - keep a calendar with appointment dates  - schedule a follow up appointment with PCP for evaluation of diabetes and HTN   Why is this important?    Part of staying healthy is seeing the doctor for follow-up care.   If you forget your appointments, there are some things you can do to stay on track.        . Monitor and Manage My Blood Sugar-Diabetes Type 1       Timeframe:  Long-Range Goal Priority:  High Start Date:   06/17/20                          Expected End Date:  10/06/20                     Follow Up Date 10/06/20   - check blood sugar at prescribed times - check blood sugar if I feel it is too high or too low - enter blood sugar readings and medication or insulin into daily log - take the blood sugar log to all doctor visits - take the blood sugar meter to all doctor visits - adhere to a diabetic  diet   Why is this important?    Checking your blood sugar at home helps to keep it from getting very high or very low.   Writing the results in a diary or log helps the doctor know how to care for you.   Your blood sugar log should have the time, the date and the results.   Also, write down the amount of insulin or other medicine you take.   Other information like what you ate, exercise done and how you were feeling will also be helpful..         . Obtain Eye Exam-Diabetes Type 1       Timeframe:  Short-Term Goal Priority:  Medium Start Date: 06/17/20                            Expected  End Date:  10/06/20                     Follow Up Date 10/06/20   - schedule appointment with eye doctor    Why is this important?    Eye check-ups are important when you have diabetes.   Vision loss can be prevented.           Please see education materials related to diabetes and hypertension provided by MyChart link.    Managing Your Hypertension Hypertension, also called high blood pressure, is when the force of the blood pressing against the walls of the arteries is too strong. Arteries are blood vessels that carry blood from your heart throughout your body. Hypertension forces the heart to work harder to pump blood and may cause the arteries to become narrow or stiff. Understanding blood pressure readings Your personal target blood pressure may vary depending on your medical conditions, your age, and other factors. A blood pressure reading includes a higher number over a lower number. Ideally, your blood pressure should be below 120/80. You should know that:  The first, or top, number is called the systolic pressure. It is a measure of the pressure in your arteries as your heart beats.  The second, or bottom number, is called the diastolic pressure. It is a measure of the pressure in your arteries as the heart relaxes. Blood pressure is classified into four stages. Based on your  blood pressure reading, your health care provider may use the following stages to determine what type of treatment you need, if any. Systolic pressure and diastolic pressure are measured in a unit called mmHg. Normal  Systolic pressure: below 076.  Diastolic pressure: below 80. Elevated  Systolic pressure: 226-333.  Diastolic pressure: below 80. Hypertension stage 1  Systolic pressure: 545-625.  Diastolic pressure: 63-89. Hypertension stage 2  Systolic pressure: 373 or above.  Diastolic pressure: 90 or above. How can this condition affect me? Managing your hypertension is an important responsibility. Over time, hypertension can damage the arteries and decrease blood flow to important parts of the body, including the brain, heart, and kidneys. Having untreated or uncontrolled hypertension can lead to:  A heart attack.  A stroke.  A weakened blood vessel (aneurysm).  Heart failure.  Kidney damage.  Eye damage.  Metabolic syndrome.  Memory and concentration problems.  Vascular dementia. What actions can I take to manage this condition? Hypertension can be managed by making lifestyle changes and possibly by taking medicines. Your health care provider will help you make a plan to bring your blood pressure within a normal range. Nutrition  Eat a diet that is high in fiber and potassium, and low in salt (sodium), added sugar, and fat. An example eating plan is called the Dietary Approaches to Stop Hypertension (DASH) diet. To eat this way: ? Eat plenty of fresh fruits and vegetables. Try to fill one-half of your plate at each meal with fruits and vegetables. ? Eat whole grains, such as whole-wheat pasta, brown rice, or whole-grain bread. Fill about one-fourth of your plate with whole grains. ? Eat low-fat dairy products. ? Avoid fatty cuts of meat, processed or cured meats, and poultry with skin. Fill about one-fourth of your plate with lean proteins such as fish, chicken  without skin, beans, eggs, and tofu. ? Avoid pre-made and processed foods. These tend to be higher in sodium, added sugar, and fat.  Reduce your daily sodium intake. Most people with hypertension  should eat less than 1,500 mg of sodium a day.   Lifestyle  Work with your health care provider to maintain a healthy body weight or to lose weight. Ask what an ideal weight is for you.  Get at least 30 minutes of exercise that causes your heart to beat faster (aerobic exercise) most days of the week. Activities may include walking, swimming, or biking.  Include exercise to strengthen your muscles (resistance exercise), such as weight lifting, as part of your weekly exercise routine. Try to do these types of exercises for 30 minutes at least 3 days a week.  Do not use any products that contain nicotine or tobacco, such as cigarettes, e-cigarettes, and chewing tobacco. If you need help quitting, ask your health care provider.  Control any long-term (chronic) conditions you have, such as high cholesterol or diabetes.  Identify your sources of stress and find ways to manage stress. This may include meditation, deep breathing, or making time for fun activities.   Alcohol use  Do not drink alcohol if: ? Your health care provider tells you not to drink. ? You are pregnant, may be pregnant, or are planning to become pregnant.  If you drink alcohol: ? Limit how much you use to:  0-1 drink a day for women.  0-2 drinks a day for men. ? Be aware of how much alcohol is in your drink. In the [place] one drink equals one 12 oz bottle of beer (355 mL), one 5 oz glass of wine (148 mL), or one 1 oz glass of hard liquor (44 mL). Medicines Your health care provider may prescribe medicine if lifestyle changes are not enough to get your blood pressure under control and if:  Your systolic blood pressure is 130 or higher.  Your diastolic blood pressure is 80 or higher. Take medicines only as told by your health  care provider. Follow the directions carefully. Blood pressure medicines must be taken as told by your health care provider. The medicine does not work as well when you skip doses. Skipping doses also puts you at risk for problems. Monitoring Before you monitor your blood pressure:  Do not smoke, drink caffeinated beverages, or exercise within 30 minutes before taking a measurement.  Use the bathroom and empty your bladder (urinate).  Sit quietly for at least 5 minutes before taking measurements. Monitor your blood pressure at home as told by your health care provider. To do this:  Sit with your back straight and supported.  Place your feet flat on the floor. Do not cross your legs.  Support your arm on a flat surface, such as a table. Make sure your upper arm is at heart level.  Each time you measure, take two or three readings one minute apart and record the results. You may also need to have your blood pressure checked regularly by your health care provider.   General information  Talk with your health care provider about your diet, exercise habits, and other lifestyle factors that may be contributing to hypertension.  Review all the medicines you take with your health care provider because there may be side effects or interactions.  Keep all visits as told by your health care provider. Your health care provider can help you create and adjust your plan for managing your high blood pressure. Where to find more information  National Heart, Lung, and Blood Institute: https://wilson-eaton.com/  American Heart Association: www.heart.org Contact a health care provider if:  You think you are having a  reaction to medicines you have taken.  You have repeated (recurrent) headaches.  You feel dizzy.  You have swelling in your ankles.  You have trouble with your vision. Get help right away if:  You develop a severe headache or confusion.  You have unusual weakness or numbness, or you  feel faint.  You have severe pain in your chest or abdomen.  You vomit repeatedly.  You have trouble breathing. These symptoms may represent a serious problem that is an emergency. Do not wait to see if the symptoms will go away. Get medical help right away. Call your local emergency services (911 in the [place] Do not drive yourself to the hospital. Summary  Hypertension is when the force of blood pumping through your arteries is too strong. If this condition is not controlled, it may put you at risk for serious complications.  Your personal target blood pressure may vary depending on your medical conditions, your age, and other factors. For most people, a normal blood pressure is less than 120/80.  Hypertension is managed by lifestyle changes, medicines, or both.  Lifestyle changes to help manage hypertension include losing weight, eating a healthy, low-sodium diet, exercising more, stopping smoking, and limiting alcohol. This information is not intended to replace advice given to you by your health care provider. Make sure you discuss any questions you have with your health care provider. Document Revised: 06/07/2019 Document Reviewed: 04/02/2019 Elsevier Patient Education  2021 [place].    Diabetes Mellitus and Nutrition, Adult When you have diabetes, or diabetes mellitus, it is very important to have healthy eating habits because your blood sugar (glucose) levels are greatly affected by what you eat and drink. Eating healthy foods in the right amounts, at about the same times every day, can help you:  Control your blood glucose.  Lower your risk of heart disease.  Improve your blood pressure.  Reach or maintain a healthy weight. What can affect my meal plan? Every person with diabetes is different, and each person has different needs for a meal plan. Your health care provider may recommend that you work with a dietitian to make a meal plan that is best for you. Your meal  plan may vary depending on factors such as:  The calories you need.  The medicines you take.  Your weight.  Your blood glucose, blood pressure, and cholesterol levels.  Your activity level.  Other health conditions you have, such as heart or kidney disease. How do carbohydrates affect me? Carbohydrates, also called carbs, affect your blood glucose level more than any other type of food. Eating carbs naturally raises the amount of glucose in your blood. Carb counting is a method for keeping track of how many carbs you eat. Counting carbs is important to keep your blood glucose at a healthy level, especially if you use insulin or take certain oral diabetes medicines. It is important to know how many carbs you can safely have in each meal. This is different for every person. Your dietitian can help you calculate how many carbs you should have at each meal and for each snack. How does alcohol affect me? Alcohol can cause a sudden decrease in blood glucose (hypoglycemia), especially if you use insulin or take certain oral diabetes medicines. Hypoglycemia can be a life-threatening condition. Symptoms of hypoglycemia, such as sleepiness, dizziness, and confusion, are similar to symptoms of having too much alcohol.  Do not drink alcohol if: ? Your health care provider tells you not to drink. ?  You are pregnant, may be pregnant, or are planning to become pregnant.  If you drink alcohol: ? Do not drink on an empty stomach. ? Limit how much you use to:  0-1 drink a day for women.  0-2 drinks a day for men. ? Be aware of how much alcohol is in your drink. In the [place] one drink equals one 12 oz bottle of beer (355 mL), one 5 oz glass of wine (148 mL), or one 1 oz glass of hard liquor (44 mL). ? Keep yourself hydrated with water, diet soda, or unsweetened iced tea.  Keep in mind that regular soda, juice, and other mixers may contain a lot of sugar and must be counted as carbs. What are tips for  following this plan? Reading food labels  Start by checking the serving size on the "Nutrition Facts" label of packaged foods and drinks. The amount of calories, carbs, fats, and other nutrients listed on the label is based on one serving of the item. Many items contain more than one serving per package.  Check the total grams (g) of carbs in one serving. You can calculate the number of servings of carbs in one serving by dividing the total carbs by 15. For example, if a food has 30 g of total carbs per serving, it would be equal to 2 servings of carbs.  Check the number of grams (g) of saturated fats and trans fats in one serving. Choose foods that have a low amount or none of these fats.  Check the number of milligrams (mg) of salt (sodium) in one serving. Most people should limit total sodium intake to less than 2,300 mg per day.  Always check the nutrition information of foods labeled as "low-fat" or "nonfat." These foods may be higher in added sugar or refined carbs and should be avoided.  Talk to your dietitian to identify your daily goals for nutrients listed on the label. Shopping  Avoid buying canned, pre-made, or processed foods. These foods tend to be high in fat, sodium, and added sugar.  Shop around the outside edge of the grocery store. This is where you will most often find fresh fruits and vegetables, bulk grains, fresh meats, and fresh dairy. Cooking  Use low-heat cooking methods, such as baking, instead of high-heat cooking methods like deep frying.  Cook using healthy oils, such as olive, canola, or sunflower oil.  Avoid cooking with butter, cream, or high-fat meats. Meal planning  Eat meals and snacks regularly, preferably at the same times every day. Avoid going long periods of time without eating.  Eat foods that are high in fiber, such as fresh fruits, vegetables, beans, and whole grains. Talk with your dietitian about how many servings of carbs you can eat at  each meal.  Eat 4-6 oz (112-168 g) of lean protein each day, such as lean meat, chicken, fish, eggs, or tofu. One ounce (oz) of lean protein is equal to: ? 1 oz (28 g) of meat, chicken, or fish. ? 1 egg. ?  cup (62 g) of tofu.  Eat some foods each day that contain healthy fats, such as avocado, nuts, seeds, and fish.   What foods should I eat? Fruits Berries. Apples. Oranges. Peaches. Apricots. Plums. Grapes. Mango. Papaya. Pomegranate. Kiwi. Cherries. Vegetables Lettuce. Spinach. Leafy greens, including kale, chard, collard greens, and mustard greens. Beets. Cauliflower. Cabbage. Broccoli. Carrots. Green beans. Tomatoes. Peppers. Onions. Cucumbers. Brussels sprouts. Grains Whole grains, such as whole-wheat or whole-grain bread, crackers,  tortillas, cereal, and pasta. Unsweetened oatmeal. Quinoa. Brown or wild rice. Meats and other proteins Seafood. Poultry without skin. Lean cuts of poultry and beef. Tofu. Nuts. Seeds. Dairy Low-fat or fat-free dairy products such as milk, yogurt, and cheese. The items listed above may not be a complete list of foods and beverages you can eat. Contact a dietitian for more information. What foods should I avoid? Fruits Fruits canned with syrup. Vegetables Canned vegetables. Frozen vegetables with butter or cream sauce. Grains Refined white flour and flour products such as bread, pasta, snack foods, and cereals. Avoid all processed foods. Meats and other proteins Fatty cuts of meat. Poultry with skin. Breaded or fried meats. Processed meat. Avoid saturated fats. Dairy Full-fat yogurt, cheese, or milk. Beverages Sweetened drinks, such as soda or iced tea. The items listed above may not be a complete list of foods and beverages you should avoid. Contact a dietitian for more information. Questions to ask a health care provider  Do I need to meet with a diabetes educator?  Do I need to meet with a dietitian?  What number can I call if I have  questions?  When are the best times to check my blood glucose? Where to find more information:  American Diabetes Association: diabetes.org  Academy of Nutrition and Dietetics: www.eatright.[employer] of Diabetes and Digestive and Kidney Diseases: [employer].dk  Association of Diabetes Care and Education Specialists: www.diabeteseducator.org Summary  It is important to have healthy eating habits because your blood sugar (glucose) levels are greatly affected by what you eat and drink.  A healthy meal plan will help you control your blood glucose and maintain a healthy lifestyle.  Your health care provider may recommend that you work with a dietitian to make a meal plan that is best for you.  Keep in mind that carbohydrates (carbs) and alcohol have immediate effects on your blood glucose levels. It is important to count carbs and to use alcohol carefully. This information is not intended to replace advice given to you by your health care provider. Make sure you discuss any questions you have with your health care provider. Document Revised: 04/09/2019 Document Reviewed: 04/09/2019 Elsevier Patient Education  2021 [place].   Patient verbalizes understanding of instructions provided today.   Telephone follow up appointment with Managed Medicaid care management team member scheduled for:10/06/20 @ 10:30am  Lurena Joiner RN, BSN [place] RN Care Coordinator   Following is a copy of your plan of care:  Patient Care Plan: Diabetes Type 1 (Adult)    Problem Identified: Glycemic Management (Diabetes, Type 1)     Long-Range Goal: Glycemic Management Optimized   Start Date: 06/17/2020  Expected End Date: 09/14/2020  Recent Progress: On track  Priority: High  Note:   CARE PLAN ENTRY Medicaid Managed Care (see longtitudinal plan of care for additional care plan information)  Objective:  Lab Results  Component Value Date   HGBA1C  8.8 (H) 03/11/2020 .   Lab Results  Component Value Date   CREATININE 0.46 06/22/2020   CREATININE 0.53 06/09/2020   CREATININE 0.51 06/05/2020   . Patient reported cbg findings: postprandial readings typically 90-140  Current Barriers:  Marland Kitchen Knowledge Deficits related to basic Diabetes pathophysiology and self care/management-[name] has a recent diagnosis of Type 1 Diabetes Mellitis and Multiple Myeloma. Treatment for MM is 2 weeks on and 1 week off. During her treatment weeks her BS readings are higher than usual due to her  receiving steroids. She reports that at the time of diagnosis she received very helpful diabetic education. She is managing DM with diet and insulin. She is scheduled for a bone marrow transplant at [facility]. Due to a +COVID test on 2/14, bone marrow transplant will be rescheduled. Diamond Robbins will have her wisdom teeth removed on 4/7 and transplant scheduled to begin 09/09/20. She will stay in [facility] for 2 weeks during the transplant process. She reports preprandial BS readings between 100-120. Diamond Robbins is feeling good and reports improvement after a mild case of COVID.   Case Manager Clinical Goal(s):  . patient will demonstrate improved adherence to prescribed treatment plan for diabetes self care/management as evidenced by:  . daily monitoring and recording of CBG . adherence to ADA/ carb modified diet . exercise 3-5 days/week . adherence to prescribed medication regimen  Interventions:  . Provided education to patient about basic DM diet . Reviewed medications with patient and discussed importance of medication adherence . Discussed plans with patient for ongoing care management follow up and provided patient with direct contact information for care management team . Reviewed scheduled/upcoming provider appointments  . Review of patient status, including review of consultants reports, relevant laboratory and other test results, and medications  completed. . Encouraged patient to schedule a follow up with her PCP and for a routine eye exam . Discussed plans for bone marrow transplant verified that Diamond Robbins had contact information for her Navigator at [facility] should any questions arise   Patient Self Care Activities:  - call to cancel if needed - keep a calendar with prescription refill dates - keep a calendar with appointment dates  - schedule a follow up appointment with PCP for evaluation of diabetes and HTN - check blood sugar at prescribed times - check blood sugar if I feel it is too high or too low - enter blood sugar readings and medication or insulin into daily log - take the blood sugar log to all doctor visits - take the blood sugar meter to all doctor visits - adhere to a diabetic diet - schedule appointment with eye doctor   Please see past updates related to this goal by clicking on the "Past Updates" button in the selected goal

## 2020-08-18 NOTE — Patient Outreach (Signed)
Medicaid Managed Care   Nurse Care Manager Note  08/18/2020 Name:  Diamond Robbins MRN:  [id number] DOB:  Nov 04, 1990  Diamond Robbins is an 30 y.o. year old female who is a primary patient of Diamond Sellar, MD.  The [facility] Managed Care Coordination team was consulted for assistance with:    HTN DMI multiple myeloma  Diamond Robbins was given information about Medicaid Managed Care Coordination team services today. Diamond Robbins agreed to services and verbal consent obtained.  Engaged with patient by telephone for follow up visit in response to provider referral for case management and/or care coordination services.   Assessments/Interventions:  Review of past medical history, allergies, medications, health status, including review of consultants reports, laboratory and other test data, was performed as part of comprehensive evaluation and provision of chronic care management services.  SDOH (Social Determinants of Health) assessments and interventions performed:   Care Plan  No Known Allergies  Medications Reviewed Today    Reviewed by Melissa Montane, RN (Registered Nurse) on 08/18/20 at 14  Med List Status: <None>  Medication Order Taking? Sig Documenting Provider Last Dose Status Informant  ACCU-CHEK GUIDE test strip [id number] Yes  [provider] Taking Active Self  Accu-Chek Softclix Lancets lancets [id number] Yes 1 each 3 (three) times daily. [provider] Taking Active Self  acyclovir (ZOVIRAX) 400 MG tablet [id number] Yes Take 1 tablet (400 mg total) by mouth 2 (two) times daily. Diamond Jack, MD Taking Active Self  amLODipine (NORVASC) 10 MG tablet [id number] Yes Take 1 tablet (10 mg total) by mouth daily. Diamond Jack, MD Taking Active   Blood Glucose Monitoring Suppl (ACCU-CHEK GUIDE) w/Device Drucie Opitz [id number] Yes  [provider] Taking Active Self  LANTUS SOLOSTAR 100 UNIT/ML Solostar Pen [id number] Yes Inject 25 Units into the skin at  bedtime.  Patient taking differently: Inject 28-30 Units into the skin at bedtime.   Diamond Jack, MD Taking Active Self  metoprolol tartrate (LOPRESSOR) 25 MG tablet [id number] Yes Take 1 tablet (25 mg total) by mouth daily. Diamond Jack, MD Taking Active   Med List Note Diamond Robbins, CPhT 03/07/13 1442): No preferred pharmacy.           Patient Active Problem List   Diagnosis Date Noted  . T1DM (type 1 diabetes mellitus) ([place]) 06/11/2020  . Multiple myeloma ([place]) 03/09/2020  . Goals of care, counseling/discussion 03/09/2020  . Pregnancy of unknown anatomic location 05/27/2019  . Chronic hypertension 05/24/2019    Conditions to be addressed/monitored per PCP order:  HTN and DMI and multiple myeloma  Care Plan : Diabetes Type 1 (Adult)  Updates made by Melissa Montane, RN since 08/18/2020 12:00 AM    Problem: Glycemic Management (Diabetes, Type 1)     Long-Range Goal: Glycemic Management Optimized   Start Date: 06/17/2020  Expected End Date: 09/14/2020  Recent Progress: On track  Priority: High  Note:   CARE PLAN ENTRY Medicaid Managed Care (see longtitudinal plan of care for additional care plan information)  Objective:  Lab Results  Component Value Date   HGBA1C 8.8 (H) 03/11/2020 .   Lab Results  Component Value Date   CREATININE 0.46 06/22/2020   CREATININE 0.53 06/09/2020   CREATININE 0.51 06/05/2020   . Patient reported cbg findings: postprandial readings typically 90-140  Current Barriers:  Marland Kitchen Knowledge Deficits related to basic Diabetes pathophysiology and self care/management-Diamond Robbins has a recent diagnosis of Type 1 Diabetes Mellitis and Multiple Myeloma. Treatment for MM is 2  weeks on and 1 week off. During her treatment weeks her BS readings are higher than usual due to her receiving steroids. She reports that at the time of diagnosis she received very helpful diabetic education. She is managing DM with diet and insulin. She is scheduled  for a bone marrow transplant at [facility]. Due to a +COVID test on 2/14, bone marrow transplant will be rescheduled. Diamond Robbins will have her wisdom teeth removed on 4/7 and transplant scheduled to begin 09/09/20. She will stay in [facility] for 2 weeks during the transplant process. She reports preprandial BS readings between 100-120. Diamond Robbins is feeling good and reports improvement after a mild case of COVID.   Case Manager Clinical Goal(s):  . patient will demonstrate improved adherence to prescribed treatment plan for diabetes self care/management as evidenced by:  . daily monitoring and recording of CBG . adherence to ADA/ carb modified diet . exercise 3-5 days/week . adherence to prescribed medication regimen  Interventions:  . Provided education to patient about basic DM diet . Reviewed medications with patient and discussed importance of medication adherence . Discussed plans with patient for ongoing care management follow up and provided patient with direct contact information for care management team . Reviewed scheduled/upcoming provider appointments  . Review of patient status, including review of consultants reports, relevant laboratory and other test results, and medications completed. . Encouraged patient to schedule a follow up with her PCP and for a routine eye exam . Discussed plans for bone marrow transplant verified that Diamond Robbins had contact information for her Navigator at [facility] should any questions arise   Patient Self Care Activities:  - call to cancel if needed - keep a calendar with prescription refill dates - keep a calendar with appointment dates  - schedule a follow up appointment with PCP for evaluation of diabetes and HTN - check blood sugar at prescribed times - check blood sugar if I feel it is too high or too low - enter blood sugar readings and medication or insulin into daily log - take the blood sugar log to all doctor visits - take the blood  sugar meter to all doctor visits - adhere to a diabetic diet - schedule appointment with eye doctor   Please see past updates related to this goal by clicking on the "Past Updates" button in the selected goal      Follow Up:  Patient agrees to Care Plan and Follow-up.  Plan: The Managed Medicaid care management team will reach out to the patient again over the next 45 days.  Date/time of next scheduled RN care management/care coordination outreach:  10/06/20 @ 10:30am  Lurena Joiner RN, [place] RN Care Coordinator

## 2020-08-21 ENCOUNTER — Other Ambulatory Visit: Payer: Self-pay

## 2020-08-21 ENCOUNTER — Inpatient Hospital Stay (HOSPITAL_COMMUNITY): Payer: Medicaid Other

## 2020-08-21 VITALS — BP 130/83 | HR 84 | Temp 97.0°F | Resp 18

## 2020-08-21 DIAGNOSIS — C9000 Multiple myeloma not having achieved remission: Secondary | ICD-10-CM

## 2020-08-21 DIAGNOSIS — Z452 Encounter for adjustment and management of vascular access device: Secondary | ICD-10-CM | POA: Diagnosis not present

## 2020-08-21 DIAGNOSIS — Z79899 Other long term (current) drug therapy: Secondary | ICD-10-CM | POA: Diagnosis not present

## 2020-08-21 LAB — CBC WITH DIFFERENTIAL/PLATELET
Abs Immature Granulocytes: 0.02 10*3/uL (ref 0.00–0.07)
Basophils Absolute: 0 10*3/uL (ref 0.0–0.1)
Basophils Relative: 0 %
Eosinophils Absolute: 0.1 10*3/uL (ref 0.0–0.5)
Eosinophils Relative: 4 %
HCT: 36.5 % (ref 36.0–46.0)
Hemoglobin: 12.2 g/dL (ref 12.0–15.0)
Immature Granulocytes: 1 %
Lymphocytes Relative: 23 %
Lymphs Abs: 0.8 10*3/uL (ref 0.7–4.0)
MCH: 29.8 pg (ref 26.0–34.0)
MCHC: 33.4 g/dL (ref 30.0–36.0)
MCV: 89.2 fL (ref 80.0–100.0)
Monocytes Absolute: 0.4 10*3/uL (ref 0.1–1.0)
Monocytes Relative: 11 %
Neutro Abs: 2.1 10*3/uL (ref 1.7–7.7)
Neutrophils Relative %: 61 %
Platelets: 289 10*3/uL (ref 150–400)
RBC: 4.09 MIL/uL (ref 3.87–5.11)
RDW: 13.6 % (ref 11.5–15.5)
WBC: 3.4 10*3/uL — ABNORMAL LOW (ref 4.0–10.5)
nRBC: 0 % (ref 0.0–0.2)

## 2020-08-21 LAB — COMPREHENSIVE METABOLIC PANEL
ALT: 20 U/L (ref 0–44)
AST: 15 U/L (ref 15–41)
Albumin: 4.2 g/dL (ref 3.5–5.0)
Alkaline Phosphatase: 99 U/L (ref 38–126)
Anion gap: 11 (ref 5–15)
BUN: 9 mg/dL (ref 6–20)
CO2: 24 mmol/L (ref 22–32)
Calcium: 9.3 mg/dL (ref 8.9–10.3)
Chloride: 103 mmol/L (ref 98–111)
Creatinine, Ser: 0.47 mg/dL (ref 0.44–1.00)
GFR, Estimated: >60 mL/min (ref >60)
Glucose, Bld: 134 mg/dL — ABNORMAL HIGH (ref 70–99)
Potassium: 3.7 mmol/L (ref 3.5–5.1)
Sodium: 138 mmol/L (ref 135–145)
Total Bilirubin: 0.6 mg/dL (ref 0.3–1.2)
Total Protein: 6.9 g/dL (ref 6.5–8.1)

## 2020-08-21 MED ORDER — HEPARIN SOD (PORK) LOCK FLUSH 100 UNIT/ML IV SOLN
500.0000 [IU] | Freq: Once | INTRAVENOUS | Status: AC
  Administered 2020-08-21: 500 [IU] via INTRAVENOUS

## 2020-08-21 MED ORDER — SODIUM CHLORIDE 0.9% FLUSH
10.0000 mL | INTRAVENOUS | Status: DC | PRN
  Administered 2020-08-21: 10 mL via INTRAVENOUS

## 2020-08-21 NOTE — Patient Instructions (Signed)
[place] at [facility] Discharge Instructions You were here today for a Hickman catheter dressing change.  Return as scheduled.    Thank you for choosing [place] at [facility] to provide your oncology and hematology care.  To afford each patient quality time with our provider, please arrive at least 15 minutes before your scheduled appointment time.   If you have a lab appointment with the [facility] please come in thru the Main Entrance and check in at the main information desk.  You need to re-schedule your appointment should you arrive 10 or more minutes late.  We strive to give you quality time with our providers, and arriving late affects you and other patients whose appointments are after yours.  Also, if you no show three or more times for appointments you may be dismissed from the clinic at the providers discretion.     Again, thank you for choosing [facility].  Our hope is that these requests will decrease the amount of time that you wait before being seen by our physicians.       _____________________________________________________________  Should you have questions after your visit to [facility], please contact our office at [phone] and follow the prompts.  Our office hours are 8:00 a.m. and 4:30 p.m. Monday - Friday.  Please note that voicemails left after 4:00 p.m. may not be returned until the following business day.  We are closed weekends and major holidays.  You do have access to a nurse 24-7, just call the main number to the clinic [phone] and do not press any options, hold on the line and a nurse will answer the phone.    For prescription refill requests, have your pharmacy contact our office and allow 72 hours.    Due to Covid, you will need to wear a mask upon entering the hospital. If you do not have a mask, a mask will be given to you at the Main Entrance upon arrival. For doctor  visits, patients may have 1 support person age 26 or older with them. For treatment visits, patients can not have anyone with them due to social distancing guidelines and our immunocompromised population.

## 2020-08-21 NOTE — Progress Notes (Signed)
Patient presented today for Hickman catheter dressing change.  Labs were drawn via central line prior to dressing change.  Each lumen was flushed with 20 mL of saline and 0.5 mL of heparin per provided ordered.  Good blood return noted from each lumen.  Patient tolerated the procedure well.  Patient left with VSS and in stable condition.

## 2020-08-22 LAB — IGG, IGA, IGM
IgA: 17 mg/dL — ABNORMAL LOW (ref 87–352)
IgG (Immunoglobin G), Serum: 449 mg/dL — ABNORMAL LOW (ref 586–1602)
IgM (Immunoglobulin M), Srm: 15 mg/dL — ABNORMAL LOW (ref 26–217)

## 2020-08-24 LAB — KAPPA/LAMBDA LIGHT CHAINS
Kappa free light chain: 14.4 mg/L (ref 3.3–19.4)
Kappa, lambda light chain ratio: 5.33 — ABNORMAL HIGH (ref 0.26–1.65)
Lambda free light chains: 2.7 mg/L — ABNORMAL LOW (ref 5.7–26.3)

## 2020-08-25 LAB — PROTEIN ELECTROPHORESIS, SERUM
A/G Ratio: 1.6 (ref 0.7–1.7)
Albumin ELP: 4 g/dL (ref 2.9–4.4)
Alpha-1-Globulin: 0.3 g/dL (ref 0.0–0.4)
Alpha-2-Globulin: 0.8 g/dL (ref 0.4–1.0)
Beta Globulin: 1.1 g/dL (ref 0.7–1.3)
Gamma Globulin: 0.4 g/dL (ref 0.4–1.8)
Globulin, Total: 2.5 g/dL (ref 2.2–3.9)
Total Protein ELP: 6.5 g/dL (ref 6.0–8.5)

## 2020-08-27 ENCOUNTER — Other Ambulatory Visit: Payer: Self-pay

## 2020-08-27 ENCOUNTER — Encounter (HOSPITAL_COMMUNITY): Payer: Self-pay

## 2020-08-27 ENCOUNTER — Inpatient Hospital Stay (HOSPITAL_COMMUNITY): Payer: Medicaid Other

## 2020-08-27 VITALS — BP 121/83 | HR 80 | Temp 97.1°F | Resp 18

## 2020-08-27 DIAGNOSIS — Z452 Encounter for adjustment and management of vascular access device: Secondary | ICD-10-CM | POA: Diagnosis not present

## 2020-08-27 DIAGNOSIS — Z79899 Other long term (current) drug therapy: Secondary | ICD-10-CM | POA: Diagnosis not present

## 2020-08-27 DIAGNOSIS — C9000 Multiple myeloma not having achieved remission: Secondary | ICD-10-CM | POA: Diagnosis not present

## 2020-08-27 MED ORDER — SODIUM CHLORIDE 0.9% FLUSH
10.0000 mL | Freq: Once | INTRAVENOUS | Status: AC
  Administered 2020-08-27: 10 mL via INTRAVENOUS

## 2020-08-27 MED ORDER — HEPARIN SOD (PORK) LOCK FLUSH 100 UNIT/ML IV SOLN
500.0000 [IU] | Freq: Once | INTRAVENOUS | Status: AC
  Administered 2020-08-27: 50 [IU] via INTRAVENOUS

## 2020-08-27 NOTE — Progress Notes (Addendum)
Patient presented today for Hickman catheter dressing change. Each lumen was flushed with 20 mL of saline and 0.5 mL of heparin per provided ordered. Patient tolerated the procedure well. Patient left with VSS in stable condition.

## 2020-08-27 NOTE — Patient Instructions (Signed)
[place] at [facility]  Discharge Instructions:  Your Hickman catheter dressing was changed and lines were flushed. Return as scheduled. _______________________________________________________________  Thank you for choosing [place] at [facility] to provide your oncology and hematology care.  To afford each patient quality time with our providers, please arrive at least 15 minutes before your scheduled appointment.  You need to re-schedule your appointment if you arrive 10 or more minutes late.  We strive to give you quality time with our providers, and arriving late affects you and other patients whose appointments are after yours.  Also, if you no show three or more times for appointments you may be dismissed from the clinic.  Again, thank you for choosing [place] at [place] hope is that these requests will allow you access to exceptional care and in a timely manner. _______________________________________________________________  If you have questions after your visit, please contact our office at [phone] between the hours of 8:30 a.m. and 5:00 p.m. Voicemails left after 4:30 p.m. will not be returned until the following business day. _______________________________________________________________  For prescription refill requests, have your pharmacy contact our office. _______________________________________________________________  Recommendations made by the consultant and any test results will be sent to your referring physician. _______________________________________________________________

## 2020-08-31 DIAGNOSIS — Z01812 Encounter for preprocedural laboratory examination: Secondary | ICD-10-CM | POA: Diagnosis not present

## 2020-08-31 DIAGNOSIS — I340 Nonrheumatic mitral (valve) insufficiency: Secondary | ICD-10-CM | POA: Diagnosis not present

## 2020-08-31 DIAGNOSIS — C9000 Multiple myeloma not having achieved remission: Secondary | ICD-10-CM | POA: Diagnosis not present

## 2020-08-31 DIAGNOSIS — Z20822 Contact with and (suspected) exposure to covid-19: Secondary | ICD-10-CM | POA: Diagnosis not present

## 2020-08-31 DIAGNOSIS — N186 End stage renal disease: Secondary | ICD-10-CM | POA: Diagnosis not present

## 2020-08-31 DIAGNOSIS — D7589 Other specified diseases of blood and blood-forming organs: Secondary | ICD-10-CM | POA: Diagnosis not present

## 2020-08-31 DIAGNOSIS — Z01818 Encounter for other preprocedural examination: Secondary | ICD-10-CM | POA: Diagnosis not present

## 2020-09-02 DIAGNOSIS — C9000 Multiple myeloma not having achieved remission: Secondary | ICD-10-CM | POA: Diagnosis not present

## 2020-09-02 DIAGNOSIS — Z01818 Encounter for other preprocedural examination: Secondary | ICD-10-CM | POA: Diagnosis not present

## 2020-09-07 DIAGNOSIS — Z20822 Contact with and (suspected) exposure to covid-19: Secondary | ICD-10-CM | POA: Diagnosis not present

## 2020-09-07 DIAGNOSIS — Z01812 Encounter for preprocedural laboratory examination: Secondary | ICD-10-CM | POA: Diagnosis not present

## 2020-09-07 DIAGNOSIS — C9000 Multiple myeloma not having achieved remission: Secondary | ICD-10-CM | POA: Diagnosis not present

## 2020-09-09 DIAGNOSIS — E119 Type 2 diabetes mellitus without complications: Secondary | ICD-10-CM | POA: Diagnosis not present

## 2020-09-09 DIAGNOSIS — Z7952 Long term (current) use of systemic steroids: Secondary | ICD-10-CM | POA: Diagnosis not present

## 2020-09-09 DIAGNOSIS — Z794 Long term (current) use of insulin: Secondary | ICD-10-CM | POA: Diagnosis not present

## 2020-09-09 DIAGNOSIS — Z79899 Other long term (current) drug therapy: Secondary | ICD-10-CM | POA: Diagnosis not present

## 2020-09-09 DIAGNOSIS — C9000 Multiple myeloma not having achieved remission: Secondary | ICD-10-CM | POA: Diagnosis not present

## 2020-09-09 DIAGNOSIS — I10 Essential (primary) hypertension: Secondary | ICD-10-CM | POA: Diagnosis not present

## 2020-09-09 DIAGNOSIS — C9200 Acute myeloblastic leukemia, not having achieved remission: Secondary | ICD-10-CM | POA: Diagnosis not present

## 2020-09-09 DIAGNOSIS — Z7982 Long term (current) use of aspirin: Secondary | ICD-10-CM | POA: Diagnosis not present

## 2020-09-09 DIAGNOSIS — D6481 Anemia due to antineoplastic chemotherapy: Secondary | ICD-10-CM | POA: Diagnosis not present

## 2020-09-10 DIAGNOSIS — Z7952 Long term (current) use of systemic steroids: Secondary | ICD-10-CM | POA: Diagnosis not present

## 2020-09-10 DIAGNOSIS — D6481 Anemia due to antineoplastic chemotherapy: Secondary | ICD-10-CM | POA: Diagnosis not present

## 2020-09-10 DIAGNOSIS — Z886 Allergy status to analgesic agent status: Secondary | ICD-10-CM | POA: Diagnosis not present

## 2020-09-10 DIAGNOSIS — Z9484 Stem cells transplant status: Secondary | ICD-10-CM | POA: Diagnosis not present

## 2020-09-10 DIAGNOSIS — R000 Tachycardia, unspecified: Secondary | ICD-10-CM | POA: Diagnosis not present

## 2020-09-10 DIAGNOSIS — C9000 Multiple myeloma not having achieved remission: Secondary | ICD-10-CM | POA: Diagnosis not present

## 2020-09-10 DIAGNOSIS — Z885 Allergy status to narcotic agent status: Secondary | ICD-10-CM | POA: Diagnosis not present

## 2020-09-10 DIAGNOSIS — Z794 Long term (current) use of insulin: Secondary | ICD-10-CM | POA: Diagnosis not present

## 2020-09-10 DIAGNOSIS — C9200 Acute myeloblastic leukemia, not having achieved remission: Secondary | ICD-10-CM | POA: Diagnosis not present

## 2020-09-10 DIAGNOSIS — Z7982 Long term (current) use of aspirin: Secondary | ICD-10-CM | POA: Diagnosis not present

## 2020-09-10 DIAGNOSIS — Z79899 Other long term (current) drug therapy: Secondary | ICD-10-CM | POA: Diagnosis not present

## 2020-09-10 DIAGNOSIS — I10 Essential (primary) hypertension: Secondary | ICD-10-CM | POA: Diagnosis not present

## 2020-09-10 DIAGNOSIS — Z48290 Encounter for aftercare following bone marrow transplant: Secondary | ICD-10-CM | POA: Diagnosis not present

## 2020-09-10 DIAGNOSIS — E109 Type 1 diabetes mellitus without complications: Secondary | ICD-10-CM | POA: Diagnosis not present

## 2020-09-11 DIAGNOSIS — Z48290 Encounter for aftercare following bone marrow transplant: Secondary | ICD-10-CM | POA: Diagnosis not present

## 2020-09-11 DIAGNOSIS — C9000 Multiple myeloma not having achieved remission: Secondary | ICD-10-CM | POA: Diagnosis not present

## 2020-09-12 DIAGNOSIS — Z7982 Long term (current) use of aspirin: Secondary | ICD-10-CM | POA: Diagnosis not present

## 2020-09-12 DIAGNOSIS — C9200 Acute myeloblastic leukemia, not having achieved remission: Secondary | ICD-10-CM | POA: Diagnosis not present

## 2020-09-12 DIAGNOSIS — D6481 Anemia due to antineoplastic chemotherapy: Secondary | ICD-10-CM | POA: Diagnosis not present

## 2020-09-12 DIAGNOSIS — Z9484 Stem cells transplant status: Secondary | ICD-10-CM | POA: Diagnosis not present

## 2020-09-12 DIAGNOSIS — I10 Essential (primary) hypertension: Secondary | ICD-10-CM | POA: Diagnosis not present

## 2020-09-12 DIAGNOSIS — E119 Type 2 diabetes mellitus without complications: Secondary | ICD-10-CM | POA: Diagnosis not present

## 2020-09-12 DIAGNOSIS — Z79899 Other long term (current) drug therapy: Secondary | ICD-10-CM | POA: Diagnosis not present

## 2020-09-12 DIAGNOSIS — Z794 Long term (current) use of insulin: Secondary | ICD-10-CM | POA: Diagnosis not present

## 2020-09-12 DIAGNOSIS — C9000 Multiple myeloma not having achieved remission: Secondary | ICD-10-CM | POA: Diagnosis not present

## 2020-09-12 DIAGNOSIS — E109 Type 1 diabetes mellitus without complications: Secondary | ICD-10-CM | POA: Diagnosis not present

## 2020-09-12 DIAGNOSIS — Z48290 Encounter for aftercare following bone marrow transplant: Secondary | ICD-10-CM | POA: Diagnosis not present

## 2020-09-13 DIAGNOSIS — Z48290 Encounter for aftercare following bone marrow transplant: Secondary | ICD-10-CM | POA: Diagnosis not present

## 2020-09-13 DIAGNOSIS — C9000 Multiple myeloma not having achieved remission: Secondary | ICD-10-CM | POA: Diagnosis not present

## 2020-09-13 DIAGNOSIS — E1065 Type 1 diabetes mellitus with hyperglycemia: Secondary | ICD-10-CM | POA: Diagnosis not present

## 2020-09-13 DIAGNOSIS — Z419 Encounter for procedure for purposes other than remedying health state, unspecified: Secondary | ICD-10-CM | POA: Diagnosis not present

## 2020-09-14 DIAGNOSIS — C9000 Multiple myeloma not having achieved remission: Secondary | ICD-10-CM | POA: Diagnosis not present

## 2020-09-14 DIAGNOSIS — R739 Hyperglycemia, unspecified: Secondary | ICD-10-CM | POA: Diagnosis not present

## 2020-09-14 DIAGNOSIS — Z48290 Encounter for aftercare following bone marrow transplant: Secondary | ICD-10-CM | POA: Diagnosis not present

## 2020-09-15 DIAGNOSIS — C9000 Multiple myeloma not having achieved remission: Secondary | ICD-10-CM | POA: Diagnosis not present

## 2020-09-15 DIAGNOSIS — R197 Diarrhea, unspecified: Secondary | ICD-10-CM | POA: Diagnosis not present

## 2020-09-15 DIAGNOSIS — Z7982 Long term (current) use of aspirin: Secondary | ICD-10-CM | POA: Diagnosis not present

## 2020-09-15 DIAGNOSIS — I10 Essential (primary) hypertension: Secondary | ICD-10-CM | POA: Diagnosis not present

## 2020-09-15 DIAGNOSIS — T451X5D Adverse effect of antineoplastic and immunosuppressive drugs, subsequent encounter: Secondary | ICD-10-CM | POA: Diagnosis not present

## 2020-09-15 DIAGNOSIS — Z794 Long term (current) use of insulin: Secondary | ICD-10-CM | POA: Diagnosis not present

## 2020-09-15 DIAGNOSIS — Z9484 Stem cells transplant status: Secondary | ICD-10-CM | POA: Diagnosis not present

## 2020-09-15 DIAGNOSIS — Z79899 Other long term (current) drug therapy: Secondary | ICD-10-CM | POA: Diagnosis not present

## 2020-09-15 DIAGNOSIS — D6481 Anemia due to antineoplastic chemotherapy: Secondary | ICD-10-CM | POA: Diagnosis not present

## 2020-09-15 DIAGNOSIS — E1110 Type 2 diabetes mellitus with ketoacidosis without coma: Secondary | ICD-10-CM | POA: Diagnosis not present

## 2020-09-17 DIAGNOSIS — C9000 Multiple myeloma not having achieved remission: Secondary | ICD-10-CM | POA: Diagnosis not present

## 2020-09-17 DIAGNOSIS — D61810 Antineoplastic chemotherapy induced pancytopenia: Secondary | ICD-10-CM | POA: Diagnosis not present

## 2020-09-17 DIAGNOSIS — A0472 Enterocolitis due to Clostridium difficile, not specified as recurrent: Secondary | ICD-10-CM | POA: Diagnosis not present

## 2020-09-17 DIAGNOSIS — N12 Tubulo-interstitial nephritis, not specified as acute or chronic: Secondary | ICD-10-CM | POA: Diagnosis not present

## 2020-09-17 DIAGNOSIS — Z20822 Contact with and (suspected) exposure to covid-19: Secondary | ICD-10-CM | POA: Diagnosis not present

## 2020-09-17 DIAGNOSIS — R509 Fever, unspecified: Secondary | ICD-10-CM | POA: Diagnosis not present

## 2020-09-17 DIAGNOSIS — Z794 Long term (current) use of insulin: Secondary | ICD-10-CM | POA: Diagnosis not present

## 2020-09-17 DIAGNOSIS — B9689 Other specified bacterial agents as the cause of diseases classified elsewhere: Secondary | ICD-10-CM | POA: Diagnosis not present

## 2020-09-17 DIAGNOSIS — E119 Type 2 diabetes mellitus without complications: Secondary | ICD-10-CM | POA: Diagnosis not present

## 2020-09-17 DIAGNOSIS — D709 Neutropenia, unspecified: Secondary | ICD-10-CM | POA: Diagnosis not present

## 2020-09-17 DIAGNOSIS — D849 Immunodeficiency, unspecified: Secondary | ICD-10-CM | POA: Diagnosis not present

## 2020-09-17 DIAGNOSIS — T451X5A Adverse effect of antineoplastic and immunosuppressive drugs, initial encounter: Secondary | ICD-10-CM | POA: Diagnosis not present

## 2020-09-17 DIAGNOSIS — Z9484 Stem cells transplant status: Secondary | ICD-10-CM | POA: Diagnosis not present

## 2020-09-17 DIAGNOSIS — C9200 Acute myeloblastic leukemia, not having achieved remission: Secondary | ICD-10-CM | POA: Diagnosis not present

## 2020-09-17 DIAGNOSIS — E1010 Type 1 diabetes mellitus with ketoacidosis without coma: Secondary | ICD-10-CM | POA: Diagnosis not present

## 2020-09-17 DIAGNOSIS — I10 Essential (primary) hypertension: Secondary | ICD-10-CM | POA: Diagnosis not present

## 2020-09-17 DIAGNOSIS — R5081 Fever presenting with conditions classified elsewhere: Secondary | ICD-10-CM | POA: Diagnosis not present

## 2020-09-17 DIAGNOSIS — A0811 Acute gastroenteropathy due to Norwalk agent: Secondary | ICD-10-CM | POA: Diagnosis not present

## 2020-09-19 DIAGNOSIS — I10 Essential (primary) hypertension: Secondary | ICD-10-CM | POA: Diagnosis not present

## 2020-09-19 DIAGNOSIS — Z9484 Stem cells transplant status: Secondary | ICD-10-CM | POA: Diagnosis not present

## 2020-09-19 DIAGNOSIS — D709 Neutropenia, unspecified: Secondary | ICD-10-CM | POA: Diagnosis not present

## 2020-09-19 DIAGNOSIS — C9200 Acute myeloblastic leukemia, not having achieved remission: Secondary | ICD-10-CM | POA: Diagnosis not present

## 2020-09-19 DIAGNOSIS — E119 Type 2 diabetes mellitus without complications: Secondary | ICD-10-CM | POA: Diagnosis not present

## 2020-09-19 DIAGNOSIS — Z794 Long term (current) use of insulin: Secondary | ICD-10-CM | POA: Diagnosis not present

## 2020-09-19 DIAGNOSIS — R5081 Fever presenting with conditions classified elsewhere: Secondary | ICD-10-CM | POA: Diagnosis not present

## 2020-09-20 DIAGNOSIS — R5081 Fever presenting with conditions classified elsewhere: Secondary | ICD-10-CM | POA: Diagnosis not present

## 2020-09-20 DIAGNOSIS — D709 Neutropenia, unspecified: Secondary | ICD-10-CM | POA: Diagnosis not present

## 2020-09-20 DIAGNOSIS — E119 Type 2 diabetes mellitus without complications: Secondary | ICD-10-CM | POA: Diagnosis not present

## 2020-09-20 DIAGNOSIS — Z9484 Stem cells transplant status: Secondary | ICD-10-CM | POA: Diagnosis not present

## 2020-09-20 DIAGNOSIS — R000 Tachycardia, unspecified: Secondary | ICD-10-CM | POA: Diagnosis not present

## 2020-09-20 DIAGNOSIS — Z794 Long term (current) use of insulin: Secondary | ICD-10-CM | POA: Diagnosis not present

## 2020-09-20 DIAGNOSIS — C9200 Acute myeloblastic leukemia, not having achieved remission: Secondary | ICD-10-CM | POA: Diagnosis not present

## 2020-09-20 DIAGNOSIS — I10 Essential (primary) hypertension: Secondary | ICD-10-CM | POA: Diagnosis not present

## 2020-09-21 DIAGNOSIS — E119 Type 2 diabetes mellitus without complications: Secondary | ICD-10-CM | POA: Diagnosis not present

## 2020-09-21 DIAGNOSIS — D509 Iron deficiency anemia, unspecified: Secondary | ICD-10-CM | POA: Diagnosis not present

## 2020-09-21 DIAGNOSIS — C9200 Acute myeloblastic leukemia, not having achieved remission: Secondary | ICD-10-CM | POA: Diagnosis not present

## 2020-09-21 DIAGNOSIS — I10 Essential (primary) hypertension: Secondary | ICD-10-CM | POA: Diagnosis not present

## 2020-09-21 DIAGNOSIS — R5081 Fever presenting with conditions classified elsewhere: Secondary | ICD-10-CM | POA: Diagnosis not present

## 2020-09-21 DIAGNOSIS — D709 Neutropenia, unspecified: Secondary | ICD-10-CM | POA: Diagnosis not present

## 2020-09-21 DIAGNOSIS — R509 Fever, unspecified: Secondary | ICD-10-CM | POA: Diagnosis not present

## 2020-09-21 DIAGNOSIS — Z9484 Stem cells transplant status: Secondary | ICD-10-CM | POA: Diagnosis not present

## 2020-09-21 DIAGNOSIS — Z794 Long term (current) use of insulin: Secondary | ICD-10-CM | POA: Diagnosis not present

## 2020-09-21 DIAGNOSIS — A0472 Enterocolitis due to Clostridium difficile, not specified as recurrent: Secondary | ICD-10-CM | POA: Diagnosis not present

## 2020-09-22 DIAGNOSIS — I10 Essential (primary) hypertension: Secondary | ICD-10-CM | POA: Diagnosis not present

## 2020-09-22 DIAGNOSIS — Z9484 Stem cells transplant status: Secondary | ICD-10-CM | POA: Diagnosis not present

## 2020-09-22 DIAGNOSIS — Z794 Long term (current) use of insulin: Secondary | ICD-10-CM | POA: Diagnosis not present

## 2020-09-22 DIAGNOSIS — R5081 Fever presenting with conditions classified elsewhere: Secondary | ICD-10-CM | POA: Diagnosis not present

## 2020-09-22 DIAGNOSIS — C9200 Acute myeloblastic leukemia, not having achieved remission: Secondary | ICD-10-CM | POA: Diagnosis not present

## 2020-09-22 DIAGNOSIS — D709 Neutropenia, unspecified: Secondary | ICD-10-CM | POA: Diagnosis not present

## 2020-09-22 DIAGNOSIS — A0472 Enterocolitis due to Clostridium difficile, not specified as recurrent: Secondary | ICD-10-CM | POA: Diagnosis not present

## 2020-09-22 DIAGNOSIS — E119 Type 2 diabetes mellitus without complications: Secondary | ICD-10-CM | POA: Diagnosis not present

## 2020-09-22 DIAGNOSIS — R509 Fever, unspecified: Secondary | ICD-10-CM | POA: Diagnosis not present

## 2020-09-23 DIAGNOSIS — I10 Essential (primary) hypertension: Secondary | ICD-10-CM | POA: Diagnosis not present

## 2020-09-23 DIAGNOSIS — R509 Fever, unspecified: Secondary | ICD-10-CM | POA: Diagnosis not present

## 2020-09-23 DIAGNOSIS — Z9484 Stem cells transplant status: Secondary | ICD-10-CM | POA: Diagnosis not present

## 2020-09-23 DIAGNOSIS — A0472 Enterocolitis due to Clostridium difficile, not specified as recurrent: Secondary | ICD-10-CM | POA: Diagnosis not present

## 2020-09-23 DIAGNOSIS — C9200 Acute myeloblastic leukemia, not having achieved remission: Secondary | ICD-10-CM | POA: Diagnosis not present

## 2020-09-23 DIAGNOSIS — E119 Type 2 diabetes mellitus without complications: Secondary | ICD-10-CM | POA: Diagnosis not present

## 2020-09-23 DIAGNOSIS — R5081 Fever presenting with conditions classified elsewhere: Secondary | ICD-10-CM | POA: Diagnosis not present

## 2020-09-23 DIAGNOSIS — D709 Neutropenia, unspecified: Secondary | ICD-10-CM | POA: Diagnosis not present

## 2020-09-23 DIAGNOSIS — Z794 Long term (current) use of insulin: Secondary | ICD-10-CM | POA: Diagnosis not present

## 2020-09-24 DIAGNOSIS — E119 Type 2 diabetes mellitus without complications: Secondary | ICD-10-CM | POA: Diagnosis not present

## 2020-09-24 DIAGNOSIS — R509 Fever, unspecified: Secondary | ICD-10-CM | POA: Diagnosis not present

## 2020-09-24 DIAGNOSIS — Z52011 Autologous donor, stem cells: Secondary | ICD-10-CM | POA: Diagnosis not present

## 2020-09-24 DIAGNOSIS — Z794 Long term (current) use of insulin: Secondary | ICD-10-CM | POA: Diagnosis not present

## 2020-09-24 DIAGNOSIS — C9200 Acute myeloblastic leukemia, not having achieved remission: Secondary | ICD-10-CM | POA: Diagnosis not present

## 2020-09-24 DIAGNOSIS — Z9484 Stem cells transplant status: Secondary | ICD-10-CM | POA: Diagnosis not present

## 2020-09-24 DIAGNOSIS — B9689 Other specified bacterial agents as the cause of diseases classified elsewhere: Secondary | ICD-10-CM | POA: Diagnosis not present

## 2020-09-24 DIAGNOSIS — A0472 Enterocolitis due to Clostridium difficile, not specified as recurrent: Secondary | ICD-10-CM | POA: Diagnosis not present

## 2020-09-24 DIAGNOSIS — I10 Essential (primary) hypertension: Secondary | ICD-10-CM | POA: Diagnosis not present

## 2020-09-24 DIAGNOSIS — R5081 Fever presenting with conditions classified elsewhere: Secondary | ICD-10-CM | POA: Diagnosis not present

## 2020-09-24 DIAGNOSIS — D709 Neutropenia, unspecified: Secondary | ICD-10-CM | POA: Diagnosis not present

## 2020-09-25 DIAGNOSIS — D709 Neutropenia, unspecified: Secondary | ICD-10-CM | POA: Diagnosis not present

## 2020-09-25 DIAGNOSIS — Z9484 Stem cells transplant status: Secondary | ICD-10-CM | POA: Diagnosis not present

## 2020-09-25 DIAGNOSIS — R509 Fever, unspecified: Secondary | ICD-10-CM | POA: Diagnosis not present

## 2020-09-25 DIAGNOSIS — R5081 Fever presenting with conditions classified elsewhere: Secondary | ICD-10-CM | POA: Diagnosis not present

## 2020-09-25 DIAGNOSIS — Z794 Long term (current) use of insulin: Secondary | ICD-10-CM | POA: Diagnosis not present

## 2020-09-25 DIAGNOSIS — A0472 Enterocolitis due to Clostridium difficile, not specified as recurrent: Secondary | ICD-10-CM | POA: Diagnosis not present

## 2020-09-25 DIAGNOSIS — E119 Type 2 diabetes mellitus without complications: Secondary | ICD-10-CM | POA: Diagnosis not present

## 2020-09-25 DIAGNOSIS — C9200 Acute myeloblastic leukemia, not having achieved remission: Secondary | ICD-10-CM | POA: Diagnosis not present

## 2020-09-25 DIAGNOSIS — I10 Essential (primary) hypertension: Secondary | ICD-10-CM | POA: Diagnosis not present

## 2020-09-26 DIAGNOSIS — D709 Neutropenia, unspecified: Secondary | ICD-10-CM | POA: Diagnosis not present

## 2020-09-26 DIAGNOSIS — A0472 Enterocolitis due to Clostridium difficile, not specified as recurrent: Secondary | ICD-10-CM | POA: Diagnosis not present

## 2020-09-26 DIAGNOSIS — M898X0 Other specified disorders of bone, multiple sites: Secondary | ICD-10-CM | POA: Diagnosis not present

## 2020-09-26 DIAGNOSIS — Z9484 Stem cells transplant status: Secondary | ICD-10-CM | POA: Diagnosis not present

## 2020-09-26 DIAGNOSIS — R509 Fever, unspecified: Secondary | ICD-10-CM | POA: Diagnosis not present

## 2020-09-26 DIAGNOSIS — Z794 Long term (current) use of insulin: Secondary | ICD-10-CM | POA: Diagnosis not present

## 2020-09-26 DIAGNOSIS — I10 Essential (primary) hypertension: Secondary | ICD-10-CM | POA: Diagnosis not present

## 2020-09-26 DIAGNOSIS — E119 Type 2 diabetes mellitus without complications: Secondary | ICD-10-CM | POA: Diagnosis not present

## 2020-09-26 DIAGNOSIS — C9200 Acute myeloblastic leukemia, not having achieved remission: Secondary | ICD-10-CM | POA: Diagnosis not present

## 2020-09-26 DIAGNOSIS — R5081 Fever presenting with conditions classified elsewhere: Secondary | ICD-10-CM | POA: Diagnosis not present

## 2020-09-27 DIAGNOSIS — Z9484 Stem cells transplant status: Secondary | ICD-10-CM | POA: Diagnosis not present

## 2020-09-27 DIAGNOSIS — E119 Type 2 diabetes mellitus without complications: Secondary | ICD-10-CM | POA: Diagnosis not present

## 2020-09-27 DIAGNOSIS — C9200 Acute myeloblastic leukemia, not having achieved remission: Secondary | ICD-10-CM | POA: Diagnosis not present

## 2020-09-27 DIAGNOSIS — I10 Essential (primary) hypertension: Secondary | ICD-10-CM | POA: Diagnosis not present

## 2020-09-27 DIAGNOSIS — R5081 Fever presenting with conditions classified elsewhere: Secondary | ICD-10-CM | POA: Diagnosis not present

## 2020-09-27 DIAGNOSIS — A0472 Enterocolitis due to Clostridium difficile, not specified as recurrent: Secondary | ICD-10-CM | POA: Diagnosis not present

## 2020-09-27 DIAGNOSIS — R509 Fever, unspecified: Secondary | ICD-10-CM | POA: Diagnosis not present

## 2020-09-27 DIAGNOSIS — Z794 Long term (current) use of insulin: Secondary | ICD-10-CM | POA: Diagnosis not present

## 2020-09-27 DIAGNOSIS — D709 Neutropenia, unspecified: Secondary | ICD-10-CM | POA: Diagnosis not present

## 2020-09-28 DIAGNOSIS — Z794 Long term (current) use of insulin: Secondary | ICD-10-CM | POA: Diagnosis not present

## 2020-09-28 DIAGNOSIS — C9200 Acute myeloblastic leukemia, not having achieved remission: Secondary | ICD-10-CM | POA: Diagnosis not present

## 2020-09-28 DIAGNOSIS — A0472 Enterocolitis due to Clostridium difficile, not specified as recurrent: Secondary | ICD-10-CM | POA: Diagnosis not present

## 2020-09-28 DIAGNOSIS — Z9484 Stem cells transplant status: Secondary | ICD-10-CM | POA: Diagnosis not present

## 2020-09-28 DIAGNOSIS — E119 Type 2 diabetes mellitus without complications: Secondary | ICD-10-CM | POA: Diagnosis not present

## 2020-09-28 DIAGNOSIS — I10 Essential (primary) hypertension: Secondary | ICD-10-CM | POA: Diagnosis not present

## 2020-09-29 ENCOUNTER — Other Ambulatory Visit (HOSPITAL_COMMUNITY): Payer: Medicaid Other

## 2020-09-29 DIAGNOSIS — I10 Essential (primary) hypertension: Secondary | ICD-10-CM | POA: Diagnosis not present

## 2020-09-29 DIAGNOSIS — C9200 Acute myeloblastic leukemia, not having achieved remission: Secondary | ICD-10-CM | POA: Diagnosis not present

## 2020-09-29 DIAGNOSIS — Z794 Long term (current) use of insulin: Secondary | ICD-10-CM | POA: Diagnosis not present

## 2020-09-29 DIAGNOSIS — A0472 Enterocolitis due to Clostridium difficile, not specified as recurrent: Secondary | ICD-10-CM | POA: Diagnosis not present

## 2020-09-29 DIAGNOSIS — E119 Type 2 diabetes mellitus without complications: Secondary | ICD-10-CM | POA: Diagnosis not present

## 2020-09-29 DIAGNOSIS — Z9484 Stem cells transplant status: Secondary | ICD-10-CM | POA: Diagnosis not present

## 2020-09-30 DIAGNOSIS — A0472 Enterocolitis due to Clostridium difficile, not specified as recurrent: Secondary | ICD-10-CM | POA: Diagnosis not present

## 2020-09-30 DIAGNOSIS — C9000 Multiple myeloma not having achieved remission: Secondary | ICD-10-CM | POA: Diagnosis not present

## 2020-10-01 DIAGNOSIS — C9000 Multiple myeloma not having achieved remission: Secondary | ICD-10-CM | POA: Diagnosis not present

## 2020-10-01 DIAGNOSIS — A0472 Enterocolitis due to Clostridium difficile, not specified as recurrent: Secondary | ICD-10-CM | POA: Diagnosis not present

## 2020-10-02 DIAGNOSIS — C9000 Multiple myeloma not having achieved remission: Secondary | ICD-10-CM | POA: Diagnosis not present

## 2020-10-02 DIAGNOSIS — A0472 Enterocolitis due to Clostridium difficile, not specified as recurrent: Secondary | ICD-10-CM | POA: Diagnosis not present

## 2020-10-03 DIAGNOSIS — C9000 Multiple myeloma not having achieved remission: Secondary | ICD-10-CM | POA: Diagnosis not present

## 2020-10-03 DIAGNOSIS — A0472 Enterocolitis due to Clostridium difficile, not specified as recurrent: Secondary | ICD-10-CM | POA: Diagnosis not present

## 2020-10-05 DIAGNOSIS — Z452 Encounter for adjustment and management of vascular access device: Secondary | ICD-10-CM | POA: Diagnosis not present

## 2020-10-05 DIAGNOSIS — Z48290 Encounter for aftercare following bone marrow transplant: Secondary | ICD-10-CM | POA: Diagnosis not present

## 2020-10-05 DIAGNOSIS — Z9484 Stem cells transplant status: Secondary | ICD-10-CM | POA: Diagnosis not present

## 2020-10-05 DIAGNOSIS — Z79899 Other long term (current) drug therapy: Secondary | ICD-10-CM | POA: Diagnosis not present

## 2020-10-05 DIAGNOSIS — A0472 Enterocolitis due to Clostridium difficile, not specified as recurrent: Secondary | ICD-10-CM | POA: Diagnosis not present

## 2020-10-05 DIAGNOSIS — Z20822 Contact with and (suspected) exposure to covid-19: Secondary | ICD-10-CM | POA: Diagnosis not present

## 2020-10-05 DIAGNOSIS — Z794 Long term (current) use of insulin: Secondary | ICD-10-CM | POA: Diagnosis not present

## 2020-10-05 DIAGNOSIS — E109 Type 1 diabetes mellitus without complications: Secondary | ICD-10-CM | POA: Diagnosis not present

## 2020-10-05 DIAGNOSIS — C9000 Multiple myeloma not having achieved remission: Secondary | ICD-10-CM | POA: Diagnosis not present

## 2020-10-06 ENCOUNTER — Other Ambulatory Visit: Payer: Self-pay | Admitting: *Deleted

## 2020-10-06 ENCOUNTER — Other Ambulatory Visit: Payer: Self-pay

## 2020-10-06 NOTE — Patient Outreach (Signed)
Medicaid Managed Care   Nurse Care Manager Note  10/06/2020 Name:  Diamond Robbins MRN:  [id number] DOB:  1991/03/29  Diamond Robbins is an 30 y.o. year old female who is a primary patient of Diamond Sellar, MD.  The Medicaid Managed Care Coordination team was consulted for assistance with:    DMII and BMT  Diamond Robbins was given information about Medicaid Managed Care Coordination team services today. Diamond Robbins agreed to services and verbal consent obtained.  Engaged with patient by telephone for follow up visit in response to provider referral for case management and/or care coordination services.   Assessments/Interventions:  Review of past medical history, allergies, medications, health status, including review of consultants reports, laboratory and other test data, was performed as part of comprehensive evaluation and provision of chronic care management services.  SDOH (Social Determinants of Health) assessments and interventions performed:   Care Plan  No Known Allergies  Medications Reviewed Today    Reviewed by Melissa Montane, RN (Registered Nurse) on 10/06/20 at 1044  Med List Status: <None>  Medication Order Taking? Sig Documenting Provider Last Dose Status Informant  ACCU-CHEK GUIDE test strip [id number] Yes  [provider] Taking Active Self  Accu-Chek Softclix Lancets lancets [id number] Yes 1 each 3 (three) times daily. [provider] Taking Active Self  acyclovir (ZOVIRAX) 400 MG tablet [id number] Yes Take 1 tablet (400 mg total) by mouth 2 (two) times daily. Diamond Jack, MD Taking Active Self  amLODipine (NORVASC) 10 MG tablet [id number] No Take 1 tablet (10 mg total) by mouth daily.  Patient not taking: Reported on 10/06/2020   Diamond Jack, MD Not Taking Active            Med Note Diamond Robbins, Diamond Robbins A   Tue Oct 06, 2020 10:41 AM) Holding at this time by provider  Blood Glucose Monitoring Suppl (ACCU-CHEK GUIDE) w/Device Diamond Robbins [id number]  Yes  [provider] Taking Active Self  folic acid (FOLVITE) 1 MG tablet [id number] Yes Take 1 mg by mouth daily. [provider] Taking Active   HYDROcodone-acetaminophen (NORCO/VICODIN) 5-325 MG tablet [id number] No Take 1 tablet by mouth every 6 (six) hours as needed.  Patient not taking: Reported on 10/06/2020   [provider] Not Taking Active   LANTUS SOLOSTAR 100 UNIT/ML Solostar Pen [id number]  Inject 25 Units into the skin at bedtime.  Patient taking differently: Inject 28-30 Units into the skin at bedtime.   Diamond Jack, MD  Active Self           Med Note Diamond Robbins, [name] A   Tue Oct 06, 2020 10:42 AM) Taking 30 units at bedtime  metoprolol tartrate (LOPRESSOR) 25 MG tablet [id number] Yes Take 1 tablet (25 mg total) by mouth daily. Diamond Jack, MD Taking Active   Multiple Vitamin (MULTIVITAMIN) tablet [id number] Yes Take 1 tablet by mouth daily. [provider] Taking Active   vancomycin (VANCOCIN) 125 MG capsule [id number] Yes Take 125 mg by mouth 4 (four) times daily. [provider] Taking Active   Med List Note Diamond Robbins, CPhT 03/07/13 1442): No preferred pharmacy.           Patient Active Problem List   Diagnosis Date Noted  . T1DM (type 1 diabetes mellitus) ([place]) 06/11/2020  . Multiple myeloma ([place]) 03/09/2020  . Goals of care, counseling/discussion 03/09/2020  . Pregnancy of unknown anatomic location 05/27/2019  . Chronic hypertension 05/24/2019    Conditions to be addressed/monitored per PCP order:  DMII  and BMT  Care Plan : Diabetes Type 1 (Adult)  Updates made by Melissa Montane, RN since 10/06/2020 12:00 AM    Problem: Glycemic Management (Diabetes, Type 1)     Long-Range Goal: Glycemic Management Optimized Completed 10/06/2020  Start Date: 06/17/2020  Expected End Date: 10/06/2020  This Visit's Progress: On track  Recent Progress: On track  Priority: High  Note:   CARE PLAN ENTRY Medicaid  Managed Care (see longtitudinal plan of care for additional care plan information)  Objective:  Lab Results  Component Value Date   HGBA1C 8.8 (H) 03/11/2020 .   Lab Results  Component Value Date   CREATININE 0.47 08/21/2020   CREATININE 0.46 06/22/2020   CREATININE 0.53 06/09/2020   . Patient reported cbg findings: postprandial readings typically 90-140  Current Barriers:  Marland Kitchen Knowledge Deficits related to basic Diabetes pathophysiology and self care/management-Diamond Robbins has a recent diagnosis of Type 1 Diabetes Mellitis and Multiple Myeloma. Treatment for MM is 2 weeks on and 1 week off. During her treatment weeks her BS readings are higher than usual due to her receiving steroids. She reports that at the time of diagnosis she received very helpful diabetic education. She is managing DM with diet and insulin. She is scheduled for a bone marrow transplant at [facility]. Due to a +COVID test on 2/14, bone marrow transplant will be rescheduled. Diamond. Robbins will have her wisdom teeth removed on 4/7 and transplant scheduled to begin 09/09/20. Update- Diamond. 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 She reports doing well. She understands that she needs to wear her mask and gloves in public, avoid crowded places, and no visitors for 100 days per provider. Patient reports improved blood sugar readings since BMT. Patient aware of follow up appointments and denies any needs at this time. Case Manager Clinical Goal(s):  . patient will demonstrate improved adherence to prescribed treatment plan for diabetes self care/management as evidenced by:  . daily monitoring and recording of CBG-met . adherence to ADA/ carb modified diet-met . exercise 3-5 days/week-met . adherence to prescribed medication regimen-met Interventions:  . Provided education to patient about basic DM diet . Reviewed medications with patient and discussed importance of medication adherence . Discussed plans with patient for ongoing care  management follow up and provided patient with direct contact information for care management team . Reviewed scheduled/upcoming provider appointments  . Review of patient status, including review of consultants reports, relevant laboratory and other test results, and medications completed. . Discussed follow up after bone marrow transplant, verified that Diamond. Barfield had contact information for her Navigator at [facility] should any questions arise Patient Self Care Activities:  - call to cancel if needed - keep a calendar with prescription refill dates - keep a calendar with appointment dates  - schedule a follow up appointment with PCP for evaluation of diabetes and HTN - check blood sugar at prescribed times - check blood sugar if I feel it is too high or too low - enter blood sugar readings and medication or insulin into daily log - take the blood sugar log to all doctor visits - take the blood sugar meter to all doctor visits - adhere to a diabetic diet - schedule appointment with eye doctor   Please see past updates related to this goal by clicking on the "Past Updates" button in the selected goal      Follow Up:  Patient agrees to Care Plan and Follow-up.  Plan: The Managed Medicaid care  management team will reach out to the patient again over the next 60 days.  Date/time of next scheduled RN care management/care coordination outreach: 12/08/20 @ 10:30am  Lurena Joiner RN, [place] RN Care Coordinator

## 2020-10-06 NOTE — Patient Instructions (Signed)
Visit Information  Ms. Sawaya was given information about Medicaid Managed Care team care coordination services as a part of their [facility] Medicaid benefit. Liba Hulsey verbally consented to engagement with the [facility] Managed Care team.   For questions related to your [facility] health plan, please call: [phone] or go here:[email or web address]  If you would like to schedule transportation through your [facility] plan, please call the following number at least 2 days in advance of your appointment: [phone].  Call the [place] at [phone], at any time, 24 hours a day, 7 days a week. If you are in danger or need immediate medical attention call 911.  Diamond Robbins - following are the goals we discussed in your visit today:  Goals Addressed            This Visit's Progress   . COMPLETED: Make and Keep All Appointments       Timeframe:  Long-Range Goal Priority:  High Start Date:  06/17/20                           Expected End Date: 10/06/20                      Follow Up Date 10/06/20    - call to cancel if needed - keep a calendar with prescription refill dates - keep a calendar with appointment dates  - schedule a follow up appointment with PCP for evaluation of diabetes and HTN   Why is this important?    Part of staying healthy is seeing the doctor for follow-up care.   If you forget your appointments, there are some things you can do to stay on track.        . COMPLETED: Monitor and Manage My Blood Sugar-Diabetes Type 1       Timeframe:  Long-Range Goal Priority:  High Start Date:   06/17/20                          Expected End Date:  10/06/20                     Follow Up Date 10/06/20   - check blood sugar at prescribed times - check blood sugar if I feel it is too high or too low - enter blood sugar readings and medication or insulin into daily log - take the blood sugar log to all doctor visits - take the  blood sugar meter to all doctor visits - adhere to a diabetic diet   Why is this important?    Checking your blood sugar at home helps to keep it from getting very high or very low.   Writing the results in a diary or log helps the doctor know how to care for you.   Your blood sugar log should have the time, the date and the results.   Also, write down the amount of insulin or other medicine you take.   Other information like what you ate, exercise done and how you were feeling will also be helpful..         . COMPLETED: Obtain Eye Exam-Diabetes Type 1       Timeframe:  Short-Term Goal Priority:  Medium Start Date: 06/17/20  Expected End Date:  10/06/20                     Follow Up Date 10/06/20   - schedule appointment with eye doctor    Why is this important?    Eye check-ups are important when you have diabetes.   Vision loss can be prevented.           Please see education materials related to safe eating provided by MyChart link.    Food Safety Eating Plan If you have a weak immune system or are at risk for illness from food, also called foodborne illness, it is important to eat in a way that keeps you safe. Following food safety practices can help protect you from bacteria and other harmful germs found in raw or fresh foods. Work with your health care provider or diet and nutrition specialist (dietitian) to choose the right foods and learn how to prepare them correctly in order to stay safe and healthy. What are tips for following this plan? Reading food labels  Make sure that dairy products, juices, and ciders have the word "pasteurized" on the label.  Do not eat anything that has the word "unpasteurized" on the label.  Use food by the expiration date shown on the packaging. Shopping  Wash your hands for at least 20 seconds with soap and water after grocery shopping. This is especially important if you handled fresh fruits and  vegetables, eggs, or raw meat.  Do not buy food that has passed the expiration or "use by" date shown on product packaging.  Do not buy any meat, poultry, or seafood salad made at the grocery store or at [employer].  Do not buy any cheese sliced at a deli. Cooking  Keep foods separate when you are preparing and cooking a meal. Do not use the same knife or cutting board to cut your fresh produce and raw meat.  Use a kitchen thermometer to check the temperature of foods.  Cook meats until they are well-done or at the correct temperature. ? Cook beef, pork, veal, lamb, and steak to 145F (63C) with a 3-minute rest time. ? Cook fish to 145F. ? Cook ground beef, pork, veal, and lamb to 160F (71C). ? Cook Sales executive (whole, pieces, and ground) to 165F (74C).  Cook egg yolks until they are firm. Egg dishes should be cooked to 160F.  Hot foods should be kept at 136F (60C) or more, and cold foods should be kept at 36F (4.4C) or less.  When preparing fruits and vegetables: ? Wash them thoroughly under cold running water before peeling or cutting them. ? Individually scrub produce items that have a thick, rough skin or rind, such as cabbage or cantaloupe. ? Do not use commercial rinses to wash fruits and vegetables.   Lifestyle  Wash your hands with soap and water for at least 20 seconds before and after preparing food. Always wash your hands after touching raw meat, eggs, or fish.  Wash any surfaces that you will be using to prepare food. This includes countertops and cutting boards. Use hot, soapy water. Wash between preparing each food item.  Wash all pots, pans, dishes, utensils, and silverware with hot water and soap before using them.  It is best to air-dry all cooking and eating utensils or use a disposable paper towel. If you use dishcloths, use clean ones and wash them frequently.  Put any leftovers in the refrigerator within 2 hours to stop  bacteria from  growing and to keep your food from going bad.  Clean lids of canned goods before you open them. What foods should I avoid? Fruits Unwashed raw fruit. Fresh fruit salsa from the deli or grocery refrigerator case. Vegetables Unwashed raw vegetables and salads. Raw vegetable sprouts, such as alfalfa, radish, broccoli, pea, and mung bean. Salads from the deli or salad bar. Fermented vegetables. Fresh, unpasteurized salsa from the deli or grocery refrigerator case. Grains Fresh bakery breads, muffins, cakes, donuts, and cream- or custard-filled cakes from a self-service bin. Raw or uncooked grain products. Bulk bins of grains and cereals (unless used as ingredients for baking or cooking). Meats and other proteins All raw, uncooked, undercooked, or rare meat, fish, eggs, poultry, or tofu. Unpasteurized, refrigerated pates or meat spreads. Unheated cold cuts from the deli, including hot dogs, dry or fermented sausage, or other deli meats. Dry-cured meat in natural casing. Pickled fish. Unprocessed nuts, unroasted raw nuts, fresh nut butters, and roasted nuts in the shell. Dairy Unpasteurized or raw milk, cheese, yogurt, and other milk products. Cheeses containing uncooked vegetables. Any imported cheeses. Beverages Non-potable water. Cold-brewed tea or "sun teas" made with warm or cold water. Raw, unpasteurized milk. Eggnog or milkshakes made with raw eggs. Unpasteurized fruit or vegetable juice. Fresh apple cider. Wine or beer that is unpasteurized, homemade or home brewed, or from a microbrewery. Matcha tea made from powder or leaves (bottled is okay). Sweets and desserts Unrefrigerated custard or cream-filled pastry products or desserts. Soft-serve ice cream or frozen yogurt. Hand-packed ice cream or frozen yogurt. Seasonings and condiments Uncooked and unwashed herbs or spices. Raw or unpasteurized honey. Prepackaged sauces stored in a refrigerated case. Other foods Any fermented foods such as  tempeh or miso products or kombucha drinks. Food products made with raw or undercooked eggs, such as Caesar salad dressing, homemade mayonnaise, homemade cookie dough, cake batters, and eggnog. Home-canned goods. The items listed above may not be a complete list of foods and beverages you should avoid. Contact a dietitian for more information. Summary  It is important to follow food safety guidelines to reduce your risk of foodborne illness.  Wash cooking and eating utensils thoroughly with soap and hot water.  Wash your hands with soap and water for at least 20 seconds before and after preparing food. Always wash your hands after touching raw meat, eggs, or fish.  Cook foods thoroughly to kill bacteria and other harmful germs on food.  Avoid raw, uncooked, and undercooked grains, vegetables, fruits, meat, and dairy. Avoid unpasteurized foods. This information is not intended to replace advice given to you by your health care provider. Make sure you discuss any questions you have with your health care provider. Document Revised: 07/16/2019 Document Reviewed: 07/16/2019 Elsevier Patient Education  2021 [place].   Patient verbalizes understanding of instructions provided today.   Telephone follow up appointment with Managed Medicaid care management team member scheduled for:12/08/20 @ 10:30am  Lurena Joiner RN, BSN Bessie RN Care Coordinator   Following is a copy of your plan of care:  Patient Care Plan: Diabetes Type 1 (Adult)    Problem Identified: Glycemic Management (Diabetes, Type 1)     Long-Range Goal: Glycemic Management Optimized Completed 10/06/2020  Start Date: 06/17/2020  Expected End Date: 10/06/2020  This Visit's Progress: On track  Recent Progress: On track  Priority: High  Note:   CARE PLAN ENTRY Medicaid Managed Care (see longtitudinal plan of care for additional  care plan information)  Objective:  Lab Results  Component  Value Date   HGBA1C 8.8 (H) 03/11/2020 .   Lab Results  Component Value Date   CREATININE 0.47 08/21/2020   CREATININE 0.46 06/22/2020   CREATININE 0.53 06/09/2020   . Patient reported cbg findings: postprandial readings typically 90-140  Current Barriers:  Marland Kitchen Knowledge Deficits related to basic Diabetes pathophysiology and self care/management-Ms Lauderbaugh has a recent diagnosis of Type 1 Diabetes Mellitis and Multiple Myeloma. Treatment for MM is 2 weeks on and 1 week off. During her treatment weeks her BS readings are higher than usual due to her receiving steroids. She reports that at the time of diagnosis she received very helpful diabetic education. She is managing DM with diet and insulin. She is scheduled for a bone marrow transplant at [facility]. Due to a +COVID test on 2/14, bone marrow transplant will be rescheduled. Ms. Turrubiates will have her wisdom teeth removed on 4/7 and transplant scheduled to begin 09/09/20. 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 She reports doing well. She understands that she needs to wear her mask and gloves in public, avoid crowded places, and no visitors for 100 days per provider. Patient reports improved blood sugar readings since BMT. Patient aware of follow up appointments and denies any needs at this time. Case Manager Clinical Goal(s):  . patient will demonstrate improved adherence to prescribed treatment plan for diabetes self care/management as evidenced by:  . daily monitoring and recording of CBG-met . adherence to ADA/ carb modified diet-met . exercise 3-5 days/week-met . adherence to prescribed medication regimen-met Interventions:  . Provided education to patient about basic DM diet . Reviewed medications with patient and discussed importance of medication adherence . Discussed plans with patient for ongoing care management follow up and provided patient with direct contact information for care management team . Reviewed scheduled/upcoming  provider appointments  . Review of patient status, including review of consultants reports, relevant laboratory and other test results, and medications completed. . Discussed follow up after bone marrow transplant, verified that Ms. Micek had contact information for her Navigator at [facility] should any questions arise Patient Self Care Activities:  - call to cancel if needed - keep a calendar with prescription refill dates - keep a calendar with appointment dates  - schedule a follow up appointment with PCP for evaluation of diabetes and HTN - check blood sugar at prescribed times - check blood sugar if I feel it is too high or too low - enter blood sugar readings and medication or insulin into daily log - take the blood sugar log to all doctor visits - take the blood sugar meter to all doctor visits - adhere to a diabetic diet - schedule appointment with eye doctor   Please see past updates related to this goal by clicking on the "Past Updates" button in the selected goal

## 2020-10-08 ENCOUNTER — Other Ambulatory Visit: Payer: Self-pay

## 2020-10-08 NOTE — Patient Outreach (Signed)
Medicaid Managed Care    Pharmacy Note  10/08/2020 Name: Diamond Robbins MRN: [id number] DOB: 17-Nov-1990  Diamond Robbins is a 30 y.o. year old female who is a primary care patient of [name], MD. The [facility] Managed Care Coordination team was consulted for assistance with disease management and care coordination needs.    Engaged with patient Engaged with patient by telephone for initial visit in response to referral for case management and/or care coordination services.  [name] was given information about Managed Medicaid Care Coordination team services today. [name] agreed to services and verbal consent obtained.   Objective:  Lab Results  Component Value Date   CREATININE 0.47 08/21/2020   CREATININE 0.46 06/22/2020   CREATININE 0.53 06/09/2020    Lab Results  Component Value Date   HGBA1C 8.8 (H) 03/11/2020    No results found for: CHOL, TRIG, HDL, CHOLHDL, VLDL, LDLCALC, LDLDIRECT  Other: (TSH, CBC, Vit D, etc.)  Clinical ASCVD: No  The ASCVD Risk score [name] DC Jr., et al., 2013) failed to calculate for the following reasons:   The 2013 ASCVD risk score is only valid for ages 62 to 49    Other: (CHADS2VASc if Afib, PHQ9 if depression, MMRC or CAT for COPD, ACT, DEXA)  BP Readings from Last 3 Encounters:  08/27/20 121/83  08/21/20 130/83  08/14/20 128/89    Assessment/Interventions: Review of patient past medical history, allergies, medications, health status, including review of consultants reports, laboratory and other test data, was performed as part of comprehensive evaluation and provision of chronic care management services.   Chemotherapy Acyclovir Plan: At goal,  patient stable/ symptoms controlled   HTN Amlodipine (Hold per internist due to hypotension) Metoprolol 64m Plan: At goal,  patient stable/ symptoms controlled   DM Lab Results  Component Value Date   HGBA1C 8.8 (H) 03/11/2020   Lab Results  Component Value Date    CREATININE 0.47 08/21/2020    Lab Results  Component Value Date   NA 138 08/21/2020   K 3.7 08/21/2020   CREATININE 0.47 08/21/2020   GFRNONAA >60 08/21/2020   GLUCOSE 134 (H) 08/21/2020   Testing 3x/day: Highest recent: 120, normally 100-120 Lantus 25 Units Plan: Glucose readings should match A1c goal, waiting for A1c to confirm   SDOH (Social Determinants of Health) assessments and interventions performed:    Care Plan  No Known Allergies  Medications Reviewed Today    Reviewed by RMelissa Montane RN (Registered Nurse) on 10/06/20 at 1044  Med List Status: <None>  Medication Order Taking? Sig Documenting Provider Last Dose Status Informant  ACCU-CHEK GUIDE test strip [id number]  [provider] Taking Active Self  Accu-Chek Softclix Lancets lancets [id number] 1 each 3 (three) times daily. [provider] Taking Active Self  acyclovir (ZOVIRAX) 400 MG tablet [id number] Take 1 tablet (400 mg total) by mouth 2 (two) times daily. KDerek Jack MD Taking Active Self  amLODipine (NORVASC) 10 MG tablet [id number] Take 1 tablet (10 mg total) by mouth daily.  Patient not taking: Reported on 10/06/2020   KDerek Jack MD Not Taking Active            Med Note ([name] MELANIE A   Tue Oct 06, 2020 10:41 AM) Holding at this time by provider  Blood Glucose Monitoring Suppl (ACCU-CHEK GUIDE) w/Device KDrucie [id number]  [provider] Taking Active Self  folic acid (FOLVITE) 1 MG tablet [id number] Take 1 mg by mouth  daily. [provider] Taking Active   HYDROcodone-acetaminophen (NORCO/VICODIN) 5-325 MG tablet [id number] No Take 1 tablet by mouth every 6 (six) hours as needed.  Patient not taking: Reported on 10/06/2020   [provider] Not Taking Active   LANTUS SOLOSTAR 100 UNIT/ML Solostar Pen [id number]  Inject 25 Units into the skin at bedtime.  Patient taking differently: Inject 28-30 Units into the skin at  bedtime.   [name], MD  Active Self           Med Note [name], MELANIE A   Tue Oct 06, 2020 10:42 AM) Taking 30 units at bedtime  metoprolol tartrate (LOPRESSOR) 25 MG tablet [id number] Yes Take 1 tablet (25 mg total) by mouth daily. [name], MD Taking Active   Multiple Vitamin (MULTIVITAMIN) tablet [id number] Yes Take 1 tablet by mouth daily. [provider] Taking Active   vancomycin (VANCOCIN) 125 MG capsule [id number] Yes Take 125 mg by mouth 4 (four) times daily. [provider] Taking Active   Med List Note [name], CPhT 03/07/13 1442): No preferred pharmacy.           Patient Active Problem List   Diagnosis Date Noted  . T1DM (type 1 diabetes mellitus) ([place]) 06/11/2020  . Multiple myeloma ([place]) 03/09/2020  . Goals of care, counseling/discussion 03/09/2020  . Pregnancy of unknown anatomic location 05/27/2019  . Chronic hypertension 05/24/2019    Conditions to be addressed/monitored: HTN, Hypertriglyceridemia and DM  Care Plan : Medication Management  Updates made by Lane Hacker, [place] since 10/08/2020 12:00 AM    Problem: Health Promotion or Disease Self-Management (General Plan of Care)     Goal: Medication Management   Note:   Current Barriers:  . Unable to achieve control of DM  .   Pharmacist Clinical Goal(s):  Marland Kitchen Over the next 30 days, patient will achieve control of DM as evidenced by current glucose levels  through collaboration with PharmD and provider.  .   Interventions: . Inter-disciplinary care team collaboration (see longitudinal plan of care) . Comprehensive medication review performed; medication list updated in electronic medical record  @RXCPDIABETES @ @RXCPHYPERTENSION @ @RXCPHYPERLIPIDEMIA @  Patient Goals/Self-Care Activities . Over the next 30 days, patient will:  - take medications as prescribed check glucose Daily, document, and provide at future appointments  Follow Up Plan: The care  management team will reach out to the patient again over the next 30 days.     Task: Mutually Develop and Royce Macadamia Achievement of Patient Goals   Note:   Care Management Activities:    - verbalization of feelings encouraged    Notes:      Medication Assistance: None required. Patient affirms current coverage meets needs.   Follow up: Agree   Plan: The care management team will reach out to the patient again over the next 60 days.   [place] Constable, Pharm.D., Managed Medicaid Pharmacist - [phone]

## 2020-10-08 NOTE — Patient Instructions (Signed)
Visit Information  Diamond Robbins was given information about Medicaid Managed Care team care coordination services as a part of their [facility] Medicaid benefit. Krisandra Bueno verbally consented to engagement with the [facility] Managed Care team.   For questions related to your [facility] health plan, please call: [phone] or go here:[email or web address]  If you would like to schedule transportation through your [facility] plan, please call the following number at least 2 days in advance of your appointment: [phone].  Call the [place] at [phone], at any time, 24 hours a day, 7 days a week. If you are in danger or need immediate medical attention call 911.  Ms. Chaloux - following are the goals we discussed in your visit today:  Goals Addressed   None     Please see education materials related to DM provided as print materials.   Patient verbalizes understanding of instructions provided today.   The Managed Medicaid care management team will reach out to the patient again over the next 60 days.   Arizona Constable, Pharm.D., Managed Medicaid Pharmacist [phone]   Following is a copy of your plan of care:  Patient Care Plan: Diabetes Type 1 (Adult)    Problem Identified: Glycemic Management (Diabetes, Type 1)     Long-Range Goal: Glycemic Management Optimized Completed 10/06/2020  Start Date: 06/17/2020  Expected End Date: 10/06/2020  This Visit's Progress: On track  Recent Progress: On track  Priority: High  Note:   CARE PLAN ENTRY Medicaid Managed Care (see longtitudinal plan of care for additional care plan information)  Objective:  Lab Results  Component Value Date   HGBA1C 8.8 (H) 03/11/2020 .   Lab Results  Component Value Date   CREATININE 0.47 08/21/2020   CREATININE 0.46 06/22/2020   CREATININE 0.53 06/09/2020   . Patient reported cbg findings: postprandial readings typically 90-140  Current Barriers:   Marland Kitchen Knowledge Deficits related to basic Diabetes pathophysiology and self care/management-Ms Gimpel has a recent diagnosis of Type 1 Diabetes Mellitis and Multiple Myeloma. Treatment for MM is 2 weeks on and 1 week off. During her treatment weeks her BS readings are higher than usual due to her receiving steroids. She reports that at the time of diagnosis she received very helpful diabetic education. She is managing DM with diet and insulin. She is scheduled for a bone marrow transplant at [facility]. Due to a +COVID test on 2/14, bone marrow transplant will be rescheduled. Ms. Krajewski will have her wisdom teeth removed on 4/7 and transplant scheduled to begin 09/09/20. 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 She reports doing well. She understands that she needs to wear her mask and gloves in public, avoid crowded places, and no visitors for 100 days per provider. Patient reports improved blood sugar readings since BMT. Patient aware of follow up appointments and denies any needs at this time. Case Manager Clinical Goal(s):  . patient will demonstrate improved adherence to prescribed treatment plan for diabetes self care/management as evidenced by:  . daily monitoring and recording of CBG-met . adherence to ADA/ carb modified diet-met . exercise 3-5 days/week-met . adherence to prescribed medication regimen-met Interventions:  . Provided education to patient about basic DM diet . Reviewed medications with patient and discussed importance of medication adherence . Discussed plans with patient for ongoing care management follow up and provided patient with direct contact information for care management team . Reviewed scheduled/upcoming provider appointments  . Review of patient status, including review  of consultants reports, relevant laboratory and other test results, and medications completed. . Discussed follow up after bone marrow transplant, verified that Ms. Dewing had contact information for  her Navigator at [facility] should any questions arise Patient Self Care Activities:  - call to cancel if needed - keep a calendar with prescription refill dates - keep a calendar with appointment dates  - schedule a follow up appointment with PCP for evaluation of diabetes and HTN - check blood sugar at prescribed times - check blood sugar if I feel it is too high or too low - enter blood sugar readings and medication or insulin into daily log - take the blood sugar log to all doctor visits - take the blood sugar meter to all doctor visits - adhere to a diabetic diet - schedule appointment with eye doctor   Please see past updates related to this goal by clicking on the "Past Updates" button in the selected goal    Patient Care Plan: Medication Management    Problem Identified: Health Promotion or Disease Self-Management (General Plan of Care)     Goal: Medication Management   Note:   Current Barriers:  . Unable to achieve control of DM  .   Pharmacist Clinical Goal(s):  Marland Kitchen Over the next 30 days, patient will achieve control of DM as evidenced by current glucose levels  through collaboration with PharmD and provider.  .   Interventions: . Inter-disciplinary care team collaboration (see longitudinal plan of care) . Comprehensive medication review performed; medication list updated in electronic medical record  @RXCPDIABETES @ @RXCPHYPERTENSION @ @RXCPHYPERLIPIDEMIA @  Patient Goals/Self-Care Activities . Over the next 30 days, patient will:  - take medications as prescribed check glucose Daily, document, and provide at future appointments  Follow Up Plan: The care management team will reach out to the patient again over the next 30 days.     Task: Mutually Develop and Royce Macadamia Achievement of Patient Goals   Note:   Care Management Activities:    - verbalization of feelings encouraged    Notes:

## 2020-10-14 DIAGNOSIS — Z419 Encounter for procedure for purposes other than remedying health state, unspecified: Secondary | ICD-10-CM | POA: Diagnosis not present

## 2020-10-27 ENCOUNTER — Other Ambulatory Visit: Payer: Self-pay

## 2020-10-27 NOTE — Patient Outreach (Signed)
Medicaid Managed Care    Pharmacy Note  10/27/2020 Name: Diamond Robbins MRN: [id number] DOB: 16-Oct-1990  Diamond Robbins is a 30 y.o. year old female who is a primary care patient of [name], MD. [facility] Managed Care Coordination team was consulted for assistance with disease management and care coordination needs.    Engaged with patient Engaged with patient by telephone for follow up visit in response to referral for case management and/or care coordination services.  Diamond Robbins was given information about Managed Medicaid Care Coordination team services today. Diamond Robbins agreed to services and verbal consent obtained.   Objective:  Lab Results  Component Value Date   CREATININE 0.47 08/21/2020   CREATININE 0.46 06/22/2020   CREATININE 0.53 06/09/2020    Lab Results  Component Value Date   HGBA1C 8.8 (H) 03/11/2020    No results found for: CHOL, TRIG, HDL, CHOLHDL, VLDL, LDLCALC, LDLDIRECT  Other: (TSH, CBC, Vit D, etc.)  Clinical ASCVD: No  The ASCVD Risk score [name] DC Jr., et al., 2013) failed to calculate for the following reasons:   The 2013 ASCVD risk score is only valid for ages 25 to 14    Other: (CHADS2VASc if Afib, PHQ9 if depression, MMRC or CAT for COPD, ACT, DEXA)  BP Readings from Last 3 Encounters:  08/27/20 121/83  08/21/20 130/83  08/14/20 128/89    Assessment/Interventions: Review of patient past medical history, allergies, medications, health status, including review of consultants reports, laboratory and other test data, was performed as part of comprehensive evaluation and provision of chronic care management services.   Chemotherapy Acyclovir Plan: At goal,  patient stable/ symptoms controlled   HTN Amlodipine (Hold per internist due to hypotension) Metoprolol 61m Plan: At goal,  patient stable/ symptoms controlled   DM Lab Results  Component Value Date   HGBA1C 8.8 (H) 03/11/2020   Lab Results  Component Value  Date   CREATININE 0.47 08/21/2020    Lab Results  Component Value Date   NA 138 08/21/2020   K 3.7 08/21/2020   CREATININE 0.47 08/21/2020   GFRNONAA >60 08/21/2020   GLUCOSE 134 (H) 08/21/2020   Testing 3x/day: Highest recent: 120, normally 100-120 Lantus 25 Units May 2022 Plan: Glucose readings should match A1c goal, waiting for A1c to confirm June 2022: Patient was driving and didn't have glucose readings. Stated isn't going to have an A1c anytime soon because doesn't have a PCP. She wants to leave her current PCP due to issues with communication over the phone. Told her to please get a PCP ASAP and to keep a glucose log. Will call again in a month to make sure she got a PCP and has glucose readings   SDOH (Social Determinants of Health) assessments and interventions performed:    Care Plan  No Known Allergies  Medications Reviewed Today     Reviewed by RMelissa Montane RN (Registered Nurse) on 10/06/20 at 1044  Med List Status: <None>   Medication Order Taking? Sig Documenting Provider Last Dose Status Informant  ACCU-CHEK GUIDE test strip [id number]  [provider] Taking Active Self  Accu-Chek Softclix Lancets lancets [id number] 1 each 3 (three) times daily. [provider] Taking Active Self  acyclovir (ZOVIRAX) 400 MG tablet [id number] Take 1 tablet (400 mg total) by mouth 2 (two) times daily. KDerek Jack MD Taking Active Self  amLODipine (NORVASC) 10 MG tablet [id number] Take 1 tablet (10 mg total) by mouth daily.  Patient  not taking: Reported on 10/06/2020   [name], MD Not Taking Active            Med Note [name], MELANIE A   Tue Oct 06, 2020 10:41 AM) Holding at this time by provider  Blood Glucose Monitoring Suppl (ACCU-CHEK GUIDE) w/Device Drucie Opitz [id number] Yes  [provider] Taking Active Self  folic acid (FOLVITE) 1 MG tablet [id number] Yes Take 1 mg by mouth daily. [provider] Taking Active    HYDROcodone-acetaminophen (NORCO/VICODIN) 5-325 MG tablet [id number] No Take 1 tablet by mouth every 6 (six) hours as needed.  Patient not taking: Reported on 10/06/2020   [provider] Not Taking Active   LANTUS SOLOSTAR 100 UNIT/ML Solostar Pen [id number]  Inject 25 Units into the skin at bedtime.  Patient taking differently: Inject 28-30 Units into the skin at bedtime.   [name], MD  Active Self           Med Note [name], MELANIE A   Tue Oct 06, 2020 10:42 AM) Taking 30 units at bedtime  metoprolol tartrate (LOPRESSOR) 25 MG tablet [id number] Yes Take 1 tablet (25 mg total) by mouth daily. [name], MD Taking Active   Multiple Vitamin (MULTIVITAMIN) tablet [id number] Yes Take 1 tablet by mouth daily. [provider] Taking Active   vancomycin (VANCOCIN) 125 MG capsule [id number] Yes Take 125 mg by mouth 4 (four) times daily. [provider] Taking Active   Med List Note [name], CPhT 03/07/13 1442): No preferred pharmacy.             Patient Active Problem List   Diagnosis Date Noted   T1DM (type 1 diabetes mellitus) ([place]) 06/11/2020   Multiple myeloma ([place]) 03/09/2020   Goals of care, counseling/discussion 03/09/2020   Pregnancy of unknown anatomic location 05/27/2019   Chronic hypertension 05/24/2019    Conditions to be addressed/monitored: HTN, Hypertriglyceridemia and DM  Care Plan : Medication Management  Updates made by Lane Hacker, [place] since 10/08/2020 12:00 AM     Problem: Health Promotion or Disease Self-Management (General Plan of Care)      Goal: Medication Management   Note:   Current Barriers:  Unable to achieve control of DM    Pharmacist Clinical Goal(s):  Over the next 30 days, patient will achieve control of DM as evidenced by current glucose levels  through collaboration with PharmD and provider.    Interventions: Inter-disciplinary care team collaboration (see longitudinal plan of  care) Comprehensive medication review performed; medication list updated in electronic medical record  @RXCPDIABETES @ @RXCPHYPERTENSION @ @RXCPHYPERLIPIDEMIA @  Patient Goals/Self-Care Activities Over the next 30 days, patient will:  - take medications as prescribed check glucose Daily, document, and provide at future appointments  Follow Up Plan: The care management team will reach out to the patient again over the next 30 days.      Task: Mutually Develop and Royce Macadamia Achievement of Patient Goals   Note:   Care Management Activities:    - verbalization of feelings encouraged    Notes:      Medication Assistance: None required. Patient affirms current coverage meets needs.   Follow up: Agree   Plan: The care management team will reach out to the patient again over the next 60 days.   [place] Constable, Pharm.D., Managed Medicaid Pharmacist - [phone]

## 2020-10-27 NOTE — Patient Instructions (Signed)
Visit Information  Ms. Maynez was given information about Medicaid Managed Care team care coordination services as a part of their [facility] Medicaid benefit. Rucha Wissinger verbally consented to engagement with the [facility] Managed Care team.   For questions related to your [facility] health plan, please call: [phone] or go here:[email or web address]  If you would like to schedule transportation through your [facility] plan, please call the following number at least 2 days in advance of your appointment: [phone].  Call the [place] at [phone], at any time, 24 hours a day, 7 days a week. If you are in danger or need immediate medical attention call 911.  Ms. Funches - following are the goals we discussed in your visit today:   Goals Addressed   None     Please see education materials related to DM provided as print materials.   Patient verbalizes understanding of instructions provided today.   The Managed Medicaid care management team will reach out to the patient again over the next 30 days.   [place] Constable, Pharm.D., Managed Medicaid Pharmacist [phone]   Following is a copy of your plan of care:  Patient Care Plan: Diabetes Type 1 (Adult)     Problem Identified: Glycemic Management (Diabetes, Type 1)      Long-Range Goal: Glycemic Management Optimized Completed 10/06/2020  Start Date: 06/17/2020  Expected End Date: 10/06/2020  This Visit's Progress: On track  Recent Progress: On track  Priority: High  Note:   CARE PLAN ENTRY Medicaid Managed Care (see longtitudinal plan of care for additional care plan information)  Objective:  Lab Results  Component Value Date   HGBA1C 8.8 (H) 03/11/2020   Lab Results  Component Value Date   CREATININE 0.47 08/21/2020   CREATININE 0.46 06/22/2020   CREATININE 0.53 06/09/2020  Patient reported cbg findings: postprandial readings typically 90-140  Current Barriers:   Knowledge Deficits related to basic Diabetes pathophysiology and self care/management-Ms Weisgerber has a recent diagnosis of Type 1 Diabetes Mellitis and Multiple Myeloma. Treatment for MM is 2 weeks on and 1 week off. During her treatment weeks her BS readings are higher than usual due to her receiving steroids. She reports that at the time of diagnosis she received very helpful diabetic education. She is managing DM with diet and insulin. She is scheduled for a bone marrow transplant at [facility]. Due to a +COVID test on 2/14, bone marrow transplant will be rescheduled. Ms. Maugeri will have her wisdom teeth removed on 4/7 and transplant scheduled to begin 09/09/20. Update- Ms. Aguilera is post BMT and at home. She reports doing well. She understands that she needs to wear her mask and gloves in public, avoid crowded places, and no visitors for 100 days per provider. Patient reports improved blood sugar readings since BMT. Patient aware of follow up appointments and denies any needs at this time. Case Manager Clinical Goal(s):  patient will demonstrate improved adherence to prescribed treatment plan for diabetes self care/management as evidenced by:  daily monitoring and recording of CBG-met adherence to ADA/ carb modified diet-met exercise 3-5 days/week-met adherence to prescribed medication regimen-met Interventions:  Provided education to patient about basic DM diet Reviewed medications with patient and discussed importance of medication adherence Discussed plans with patient for ongoing care management follow up and provided patient with direct contact information for care management team Reviewed scheduled/upcoming provider appointments  Review of patient status, including review of consultants reports, relevant laboratory and other test results, and medications  completed. Discussed follow up after bone marrow transplant, verified that Ms. Shakoor had contact information for her Navigator at [facility]  should any questions arise Patient Self Care Activities:  - call to cancel if needed - keep a calendar with prescription refill dates - keep a calendar with appointment dates  - schedule a follow up appointment with PCP for evaluation of diabetes and HTN - check blood sugar at prescribed times - check blood sugar if I feel it is too high or too low - enter blood sugar readings and medication or insulin into daily log - take the blood sugar log to all doctor visits - take the blood sugar meter to all doctor visits - adhere to a diabetic diet - schedule appointment with eye doctor   Please see past updates related to this goal by clicking on the "Past Updates" button in the selected goal     Patient Care Plan: Medication Management     Problem Identified: Health Promotion or Disease Self-Management (General Plan of Care)      Goal: Medication Management   Note:   Current Barriers:  Unable to achieve control of DM    Pharmacist Clinical Goal(s):  Over the next 30 days, patient will achieve control of DM as evidenced by current glucose levels  through collaboration with PharmD and provider.    Interventions: Inter-disciplinary care team collaboration (see longitudinal plan of care) Comprehensive medication review performed; medication list updated in electronic medical record  @RXCPDIABETES @ @RXCPHYPERTENSION @ @RXCPHYPERLIPIDEMIA @  Patient Goals/Self-Care Activities Over the next 30 days, patient will:  - take medications as prescribed check glucose Daily, document, and provide at future appointments  Follow Up Plan: The care management team will reach out to the patient again over the next 30 days.      Task: Mutually Develop and Royce Macadamia Achievement of Patient Goals   Note:   Care Management Activities:    - verbalization of feelings encouraged    Notes:

## 2020-10-31 ENCOUNTER — Other Ambulatory Visit (HOSPITAL_COMMUNITY): Payer: Self-pay | Admitting: Hematology

## 2020-11-06 ENCOUNTER — Encounter (HOSPITAL_COMMUNITY): Payer: Self-pay | Admitting: Hematology

## 2020-11-06 ENCOUNTER — Other Ambulatory Visit (HOSPITAL_COMMUNITY): Payer: Self-pay

## 2020-11-06 DIAGNOSIS — D472 Monoclonal gammopathy: Secondary | ICD-10-CM

## 2020-11-06 DIAGNOSIS — C9000 Multiple myeloma not having achieved remission: Secondary | ICD-10-CM

## 2020-11-09 ENCOUNTER — Inpatient Hospital Stay (HOSPITAL_COMMUNITY): Payer: Medicaid Other

## 2020-11-09 ENCOUNTER — Inpatient Hospital Stay (HOSPITAL_COMMUNITY): Payer: Medicaid Other | Attending: Hematology

## 2020-11-09 ENCOUNTER — Other Ambulatory Visit: Payer: Self-pay

## 2020-11-09 ENCOUNTER — Ambulatory Visit (HOSPITAL_COMMUNITY): Payer: Medicaid Other | Admitting: Hematology

## 2020-11-09 DIAGNOSIS — D472 Monoclonal gammopathy: Secondary | ICD-10-CM

## 2020-11-09 DIAGNOSIS — C9000 Multiple myeloma not having achieved remission: Secondary | ICD-10-CM | POA: Diagnosis not present

## 2020-11-09 LAB — COMPREHENSIVE METABOLIC PANEL
ALT: 17 U/L (ref 0–44)
AST: 18 U/L (ref 15–41)
Albumin: 4.4 g/dL (ref 3.5–5.0)
Alkaline Phosphatase: 72 U/L (ref 38–126)
Anion gap: 10 (ref 5–15)
BUN: 16 mg/dL (ref 6–20)
CO2: 25 mmol/L (ref 22–32)
Calcium: 9.8 mg/dL (ref 8.9–10.3)
Chloride: 105 mmol/L (ref 98–111)
Creatinine, Ser: 0.63 mg/dL (ref 0.44–1.00)
GFR, Estimated: >60 mL/min (ref >60)
Glucose, Bld: 184 mg/dL — ABNORMAL HIGH (ref 70–99)
Potassium: 3.4 mmol/L — ABNORMAL LOW (ref 3.5–5.1)
Sodium: 140 mmol/L (ref 135–145)
Total Bilirubin: 0.4 mg/dL (ref 0.3–1.2)
Total Protein: 7.2 g/dL (ref 6.5–8.1)

## 2020-11-09 LAB — CBC WITH DIFFERENTIAL/PLATELET
Abs Immature Granulocytes: 0 10*3/uL (ref 0.00–0.07)
Basophils Absolute: 0 10*3/uL (ref 0.0–0.1)
Basophils Relative: 1 %
Eosinophils Absolute: 0.2 10*3/uL (ref 0.0–0.5)
Eosinophils Relative: 4 %
HCT: 38 % (ref 36.0–46.0)
Hemoglobin: 12.4 g/dL (ref 12.0–15.0)
Immature Granulocytes: 0 %
Lymphocytes Relative: 36 %
Lymphs Abs: 1.5 10*3/uL (ref 0.7–4.0)
MCH: 29.3 pg (ref 26.0–34.0)
MCHC: 32.6 g/dL (ref 30.0–36.0)
MCV: 89.8 fL (ref 80.0–100.0)
Monocytes Absolute: 0.4 10*3/uL (ref 0.1–1.0)
Monocytes Relative: 9 %
Neutro Abs: 2.1 10*3/uL (ref 1.7–7.7)
Neutrophils Relative %: 50 %
Platelets: 310 10*3/uL (ref 150–400)
RBC: 4.23 MIL/uL (ref 3.87–5.11)
RDW: 13.6 % (ref 11.5–15.5)
WBC: 4.1 10*3/uL (ref 4.0–10.5)
nRBC: 0 % (ref 0.0–0.2)

## 2020-11-09 LAB — MAGNESIUM: Magnesium: 1.8 mg/dL (ref 1.7–2.4)

## 2020-11-09 LAB — LACTATE DEHYDROGENASE: LDH: 129 U/L (ref 98–192)

## 2020-11-10 LAB — PROTEIN ELECTROPHORESIS, SERUM
A/G Ratio: 1.4 (ref 0.7–1.7)
Albumin ELP: 4 g/dL (ref 2.9–4.4)
Alpha-1-Globulin: 0.2 g/dL (ref 0.0–0.4)
Alpha-2-Globulin: 0.8 g/dL (ref 0.4–1.0)
Beta Globulin: 1.2 g/dL (ref 0.7–1.3)
Gamma Globulin: 0.6 g/dL (ref 0.4–1.8)
Globulin, Total: 2.8 g/dL (ref 2.2–3.9)
Total Protein ELP: 6.8 g/dL (ref 6.0–8.5)

## 2020-11-10 LAB — KAPPA/LAMBDA LIGHT CHAINS
Kappa free light chain: 3.7 mg/L (ref 3.3–19.4)
Kappa, lambda light chain ratio: 1.23 (ref 0.26–1.65)
Lambda free light chains: 3 mg/L — ABNORMAL LOW (ref 5.7–26.3)

## 2020-11-11 LAB — IMMUNOFIXATION ELECTROPHORESIS
IgA: 32 mg/dL — ABNORMAL LOW (ref 87–352)
IgG (Immunoglobin G), Serum: 726 mg/dL (ref 586–1602)
IgM (Immunoglobulin M), Srm: 11 mg/dL — ABNORMAL LOW (ref 26–217)
Total Protein ELP: 6.9 g/dL (ref 6.0–8.5)

## 2020-11-13 DIAGNOSIS — Z419 Encounter for procedure for purposes other than remedying health state, unspecified: Secondary | ICD-10-CM | POA: Diagnosis not present

## 2020-11-25 ENCOUNTER — Encounter (HOSPITAL_COMMUNITY): Payer: Self-pay | Admitting: Hematology and Oncology

## 2020-11-25 ENCOUNTER — Telehealth (HOSPITAL_COMMUNITY): Payer: Self-pay | Admitting: Pharmacist

## 2020-11-25 ENCOUNTER — Other Ambulatory Visit: Payer: Self-pay

## 2020-11-25 ENCOUNTER — Inpatient Hospital Stay (HOSPITAL_COMMUNITY): Payer: Medicaid Other | Attending: Hematology | Admitting: Hematology and Oncology

## 2020-11-25 DIAGNOSIS — C9000 Multiple myeloma not having achieved remission: Secondary | ICD-10-CM | POA: Diagnosis not present

## 2020-11-25 DIAGNOSIS — E109 Type 1 diabetes mellitus without complications: Secondary | ICD-10-CM | POA: Diagnosis not present

## 2020-11-25 DIAGNOSIS — I10 Essential (primary) hypertension: Secondary | ICD-10-CM | POA: Insufficient documentation

## 2020-11-25 DIAGNOSIS — D472 Monoclonal gammopathy: Secondary | ICD-10-CM | POA: Diagnosis not present

## 2020-11-25 MED ORDER — AMLODIPINE BESYLATE 10 MG PO TABS: 10.0000 mg | ORAL_TABLET | Freq: Every day | ORAL | 1 refills | Status: DC

## 2020-11-25 MED ORDER — LENALIDOMIDE 10 MG PO CAPS: ORAL_CAPSULE | ORAL | 0 refills | Status: DC

## 2020-11-25 NOTE — Telephone Encounter (Signed)
Oral Oncology Pharmacist Encounter  Received new prescription for Revlimid (lenalidomide) for the maintenance treatment of multiple myeloma s/p transplant, planned duration until disease progression or unacceptable drug toxicity.  CMP from 11/09/20 assessed, no relevant lab abnormalities. Prescription dose and frequency assessed.   Current medication list in Epic reviewed, no DDIs with lenalidomide identified.  Evaluated chart and no patient barriers to medication adherence identified.   Prescription has been e-scribed to the [facility] for benefits analysis and approval.  Oral Oncology Clinic will continue to follow for insurance authorization, copayment issues, initial counseling and start date.   Darl Pikes, PharmD, BCPS, BCOP, CPP Hematology/Oncology Clinical Pharmacist Practitioner [facility] [phone]  11/25/2020 5:47 PM

## 2020-11-25 NOTE — Assessment & Plan Note (Signed)
I have reviewed extensive records from her transplant team Her most recent myeloma panel showed remission Per recommendation, we will start her on maintenance posttransplant Revlimid therapy I will start her at 10 mg dose She will continue DVT prophylaxis with aspirin We discussed the importance of Zometa for bone health I recommend calcium with vitamin D The patient will see a dentist to get dental clearance before we start her on Zometa She will continue acyclovir for antimicrobial prophylaxis

## 2020-11-25 NOTE — Progress Notes (Signed)
[facility] FOLLOW-UP progress notes  Patient Care Team: [name], MD as PCP - General (General Practice) Melissa Montane, RN as Case Manager Lane Hacker, [facility] as Pharmacist (Pharmacist)  CHIEF COMPLAINTS/PURPOSE OF VISIT:  Multiple myeloma, for further evaluation  HISTORY OF PRESENTING ILLNESS:  Diamond Robbins 30 y.o. female is seen because her primary oncologist is not available I have reviewed her records from care everywhere The patient received induction chemotherapy here followed by autologous stem cell transplant Her posttransplant course was complicated by neutropenic fever requiring broad-spectrum IV antibiotics and subsequently she developed C. difficile She has completed her treatment for C. difficile She is recovering well from her transplant She has not started back on Revlimid for maintenance treatment She has not received treatment with Zometa She denies bone pain   MEDICAL HISTORY:  Past Medical History:  Diagnosis Date   Cancer ([place]) 08/01/2020   multiple myeloma   DKA (diabetic ketoacidosis) ([place]) 02/23/2020   Hypertension    Type 1 diabetes mellitus ([place]) 02/23/2020    SURGICAL HISTORY: Past Surgical History:  Procedure Laterality Date   NO PAST SURGERIES      SOCIAL HISTORY: Social History   Socioeconomic History   Marital status: Single    Spouse name: Not on file   Number of children: Not on file   Years of education: Not on file   Highest education level: Not on file  Occupational History   Occupation: unemployed  Tobacco Use   Smoking status: Never   Smokeless tobacco: Never  Vaping Use   Vaping Use: Never used  Substance and Sexual Activity   Alcohol use: Not Currently    Comment: occ   Drug use: No   Sexual activity: Yes    Birth control/protection: Condom  Other Topics Concern   Not on file  Social History Narrative   Pt is engaged   Social Determinants of Health   Financial Resource Strain: Low  Risk    Difficulty of Paying Living Expenses: Not hard at all  Food Insecurity: No Food Insecurity   Worried About Charity fundraiser in the Last Year: Never true   Ran Out of Food in the Last Year: Never true  Transportation Needs: No Transportation Needs   Lack of Transportation (Medical): No   Lack of Transportation (Non-Medical): No  Physical Activity: Insufficiently Active   Days of Exercise per Week: 3 days   Minutes of Exercise per Session: 10 min  Stress: No Stress Concern Present   Feeling of Stress : Not at all  Social Connections: Moderately Integrated   Frequency of Communication with Friends and Family: More than three times a week   Frequency of Social Gatherings with Friends and Family: More than three times a week   Attends Religious Services: More than 4 times per year   Active Member of [employer] or Organizations: No   Attends Music therapist: Never   Marital Status: Married  Human resources officer Violence: Not At Risk   Fear of Current or Ex-Partner: No   Emotionally Abused: No   Physically Abused: No   Sexually Abused: No    FAMILY HISTORY: Family History  Problem Relation Age of Onset   Hypertension Mother    Diabetes Mother    Hypertension Father    Diabetes Maternal Grandmother     ALLERGIES:  has No Known Allergies.  MEDICATIONS:  Current Outpatient Medications  Medication Sig Dispense Refill   ACCU-CHEK GUIDE test strip  Accu-Chek Softclix Lancets lancets 1 each 3 (three) times daily.     acyclovir (ZOVIRAX) 800 MG tablet Take by mouth.     aspirin 325 MG tablet Take 325 mg by mouth daily.     Blood Glucose Monitoring Suppl (ACCU-CHEK GUIDE) w/Device KIT      calcium carbonate (OS-CAL) 1250 (500 Ca) MG chewable tablet Chew 1 tablet by mouth daily.     cholecalciferol (VITAMIN D3) 25 MCG (1000 UNIT) tablet Take 2,000 Units by mouth daily.     folic acid (FOLVITE) 1 MG tablet Take 1 mg by mouth daily.     LANTUS SOLOSTAR 100 UNIT/ML  Solostar Pen Inject 25 Units into the skin at bedtime. (Patient taking differently: Inject 28-30 Units into the skin at bedtime.) 15 mL 0   lenalidomide (REVLIMID) 10 MG capsule [employer] Auth #      Date Obtained  Take 1 capsule daily for 21 days, then take 7 days off 21 capsule 0   metoprolol tartrate (LOPRESSOR) 25 MG tablet Take 1 tablet (25 mg total) by mouth daily. 30 tablet 1   Multiple Vitamin (MULTIVITAMIN) tablet Take 1 tablet by mouth daily.     amLODipine (NORVASC) 10 MG tablet Take 1 tablet (10 mg total) by mouth daily. 30 tablet 1   ondansetron (ZOFRAN) 8 MG tablet Take by mouth. (Patient not taking: Reported on 11/25/2020)     prochlorperazine (COMPAZINE) 10 MG tablet Take by mouth. (Patient not taking: Reported on 11/25/2020)     No current facility-administered medications for this visit.    REVIEW OF SYSTEMS:   Constitutional: Denies fevers, chills or abnormal night sweats Eyes: Denies blurriness of vision, double vision or watery eyes Ears, nose, mouth, throat, and face: Denies mucositis or sore throat Respiratory: Denies cough, dyspnea or wheezes Cardiovascular: Denies palpitation, chest discomfort or lower extremity swelling Gastrointestinal:  Denies nausea, heartburn or change in bowel habits Skin: Denies abnormal skin rashes Lymphatics: Denies new lymphadenopathy or easy bruising Neurological:Denies numbness, tingling or new weaknesses Behavioral/Psych: Mood is stable, no new changes  All other systems were reviewed with the patient and are negative.  PHYSICAL EXAMINATION: ECOG PERFORMANCE STATUS: 0 - Asymptomatic  Vitals:   11/25/20 1140  BP: (!) 144/104  Pulse: 85  Resp: 18  Temp: (!) 97 F (36.1 C)  SpO2: 100%   Filed Weights   11/25/20 1140  Weight: 160 lb 9.6 oz (72.8 kg)    GENERAL:alert, no distress and comfortable SKIN: skin color, texture, turgor are normal, no rashes or significant lesions EYES: normal, conjunctiva are pink and non-injected,  sclera clear OROPHARYNX:no exudate, normal lips, buccal mucosa, and tongue  NECK: supple, thyroid normal size, non-tender, without nodularity LYMPH:  no palpable lymphadenopathy in the cervical, axillary or inguinal LUNGS: clear to auscultation and percussion with normal breathing effort HEART: regular rate & rhythm and no murmurs without lower extremity edema ABDOMEN:abdomen soft, non-tender and normal bowel sounds Musculoskeletal:no cyanosis of digits and no clubbing  PSYCH: alert & oriented x 3 with fluent speech NEURO: no focal motor/sensory deficits  LABORATORY DATA:  I have reviewed the data as listed Lab Results  Component Value Date   WBC 4.1 11/09/2020   HGB 12.4 11/09/2020   HCT 38.0 11/09/2020   MCV 89.8 11/09/2020   PLT 310 11/09/2020   Recent Labs    06/22/20 1033 08/21/20 1034 11/09/20 1035  NA 134* 138 140  K 3.6 3.7 3.4*  CL 103 103 105  CO2 21* 24 25  GLUCOSE 168* 134* 184*  BUN 11 9 16   CREATININE 0.46 0.47 0.63  CALCIUM 8.9 9.3 9.8  GFRNONAA >60 >60 >60  PROT 7.8 6.9 7.2  ALBUMIN 4.4 4.2 4.4  AST 17 15 18   ALT 24 20 17   ALKPHOS 111 99 72  BILITOT 0.5 0.6 0.4    ASSESSMENT & PLAN:  Multiple myeloma (HCC) I have reviewed extensive records from her transplant team Her most recent myeloma panel showed remission Per recommendation, we will start her on maintenance posttransplant Revlimid therapy I will start her at 10 mg dose She will continue DVT prophylaxis with aspirin We discussed the importance of Zometa for bone health I recommend calcium with vitamin D The patient will see a dentist to get dental clearance before we start her on Zometa She will continue acyclovir for antimicrobial prophylaxis  Chronic hypertension Her blood pressure is elevated I refilled her prescription amlodipine  T1DM (type 1 diabetes mellitus) ([place]) She will continue insulin therapy as directed  Orders Placed This Encounter  Procedures   CBC with Differential     Standing Status:   Standing    Number of Occurrences:   50    Standing Expiration Date:   11/25/2021   Comprehensive metabolic panel    Standing Status:   Standing    Number of Occurrences:   50    Standing Expiration Date:   11/25/2021   Magnesium    Standing Status:   Standing    Number of Occurrences:   50    Standing Expiration Date:   11/25/2021    All questions were answered. The patient knows to call the clinic with any problems, questions or concerns. The total time spent in the appointment was 30 minutes encounter with patients including review of chart and various tests results, discussions about plan of care and coordination of care plan   [name], MD 11/25/2020 6:15 PM

## 2020-11-25 NOTE — Assessment & Plan Note (Signed)
She will continue insulin therapy as directed

## 2020-11-25 NOTE — Assessment & Plan Note (Signed)
Her blood pressure is elevated I refilled her prescription amlodipine

## 2020-11-26 ENCOUNTER — Other Ambulatory Visit (HOSPITAL_COMMUNITY): Payer: Self-pay

## 2020-11-26 ENCOUNTER — Encounter (HOSPITAL_COMMUNITY): Payer: Self-pay | Admitting: Hematology and Oncology

## 2020-11-27 ENCOUNTER — Other Ambulatory Visit (HOSPITAL_COMMUNITY): Payer: Self-pay

## 2020-11-27 ENCOUNTER — Inpatient Hospital Stay (HOSPITAL_COMMUNITY): Payer: Medicaid Other

## 2020-11-27 ENCOUNTER — Other Ambulatory Visit: Payer: Self-pay

## 2020-11-27 DIAGNOSIS — E109 Type 1 diabetes mellitus without complications: Secondary | ICD-10-CM | POA: Diagnosis not present

## 2020-11-27 DIAGNOSIS — C9000 Multiple myeloma not having achieved remission: Secondary | ICD-10-CM | POA: Diagnosis not present

## 2020-11-27 DIAGNOSIS — D472 Monoclonal gammopathy: Secondary | ICD-10-CM

## 2020-11-27 DIAGNOSIS — I10 Essential (primary) hypertension: Secondary | ICD-10-CM | POA: Diagnosis not present

## 2020-11-27 LAB — COMPREHENSIVE METABOLIC PANEL
ALT: 18 U/L (ref 0–44)
AST: 18 U/L (ref 15–41)
Albumin: 4.8 g/dL (ref 3.5–5.0)
Alkaline Phosphatase: 79 U/L (ref 38–126)
Anion gap: 10 (ref 5–15)
BUN: 19 mg/dL (ref 6–20)
CO2: 25 mmol/L (ref 22–32)
Calcium: 10.2 mg/dL (ref 8.9–10.3)
Chloride: 104 mmol/L (ref 98–111)
Creatinine, Ser: 0.51 mg/dL (ref 0.44–1.00)
GFR, Estimated: >60 mL/min (ref >60)
Glucose, Bld: 125 mg/dL — ABNORMAL HIGH (ref 70–99)
Potassium: 4 mmol/L (ref 3.5–5.1)
Sodium: 139 mmol/L (ref 135–145)
Total Bilirubin: 0.3 mg/dL (ref 0.3–1.2)
Total Protein: 8 g/dL (ref 6.5–8.1)

## 2020-11-27 LAB — CBC WITH DIFFERENTIAL/PLATELET
Abs Immature Granulocytes: 0.01 10*3/uL (ref 0.00–0.07)
Basophils Absolute: 0 10*3/uL (ref 0.0–0.1)
Basophils Relative: 0 %
Eosinophils Absolute: 0.1 10*3/uL (ref 0.0–0.5)
Eosinophils Relative: 3 %
HCT: 40 % (ref 36.0–46.0)
Hemoglobin: 13.5 g/dL (ref 12.0–15.0)
Immature Granulocytes: 0 %
Lymphocytes Relative: 35 %
Lymphs Abs: 1.6 10*3/uL (ref 0.7–4.0)
MCH: 29.7 pg (ref 26.0–34.0)
MCHC: 33.8 g/dL (ref 30.0–36.0)
MCV: 87.9 fL (ref 80.0–100.0)
Monocytes Absolute: 0.5 10*3/uL (ref 0.1–1.0)
Monocytes Relative: 10 %
Neutro Abs: 2.4 10*3/uL (ref 1.7–7.7)
Neutrophils Relative %: 52 %
Platelets: 333 10*3/uL (ref 150–400)
RBC: 4.55 MIL/uL (ref 3.87–5.11)
RDW: 12.9 % (ref 11.5–15.5)
WBC: 4.6 10*3/uL (ref 4.0–10.5)
nRBC: 0 % (ref 0.0–0.2)

## 2020-11-27 LAB — LACTATE DEHYDROGENASE: LDH: 133 U/L (ref 98–192)

## 2020-11-27 LAB — PREGNANCY, URINE: Preg Test, Ur: NEGATIVE

## 2020-11-27 LAB — MAGNESIUM: Magnesium: 1.9 mg/dL (ref 1.7–2.4)

## 2020-11-27 MED ORDER — LENALIDOMIDE 10 MG PO CAPS: ORAL_CAPSULE | ORAL | 0 refills | Status: DC

## 2020-11-27 NOTE — Telephone Encounter (Signed)
Prescription for Revlimid 10mg  21 days on, 7 days off to [facility] per Dr. Alvy Bimler. Negative pregnancy test has been faxed over to the pharmacy as well.

## 2020-11-30 LAB — PROTEIN ELECTROPHORESIS, SERUM
A/G Ratio: 1.4 (ref 0.7–1.7)
Albumin ELP: 4.2 g/dL (ref 2.9–4.4)
Alpha-1-Globulin: 0.2 g/dL (ref 0.0–0.4)
Alpha-2-Globulin: 0.9 g/dL (ref 0.4–1.0)
Beta Globulin: 1.3 g/dL (ref 0.7–1.3)
Gamma Globulin: 0.6 g/dL (ref 0.4–1.8)
Globulin, Total: 3 g/dL (ref 2.2–3.9)
Total Protein ELP: 7.2 g/dL (ref 6.0–8.5)

## 2020-12-01 ENCOUNTER — Other Ambulatory Visit: Payer: Self-pay

## 2020-12-01 NOTE — Patient Outreach (Signed)
Medicaid Managed Care    Pharmacy Note  12/01/2020 Name: Diamond Robbins MRN: [id number] DOB: [birth year]  Diamond Robbins is a 30 y.o. year old female who is a primary care patient of [name], MD. The [facility] Managed Care Coordination team was consulted for assistance with disease management and care coordination needs.    Engaged with patient Engaged with patient by telephone for follow up visit in response to referral for case management and/or care coordination services.  Diamond Robbins was given information about Managed Medicaid Care Coordination team services today. Diamond Robbins agreed to services and verbal consent obtained.   Objective:  Lab Results  Component Value Date   CREATININE 0.51 11/27/2020   CREATININE 0.63 11/09/2020   CREATININE 0.47 08/21/2020    Lab Results  Component Value Date   HGBA1C 8.8 (H) 03/11/2020    No results found for: CHOL, TRIG, HDL, CHOLHDL, VLDL, LDLCALC, LDLDIRECT  Other: (TSH, CBC, Vit D, etc.)  Clinical ASCVD: No  The ASCVD Risk score [name] DC Jr., et al., 2013) failed to calculate for the following reasons:   The 2013 ASCVD risk score is only valid for ages 46 to 20    Other: (CHADS2VASc if Afib, PHQ9 if depression, MMRC or CAT for COPD, ACT, DEXA)  BP Readings from Last 3 Encounters:  11/25/20 (!) 144/104  08/27/20 121/83  08/21/20 130/83    Assessment/Interventions: Review of patient past medical history, allergies, medications, health status, including review of consultants reports, laboratory and other test data, was performed as part of comprehensive evaluation and provision of chronic care management services.   Chemotherapy Acyclovir Plan: At goal,  patient stable/ symptoms controlled   HTN Amlodipine (Hold per internist due to hypotension) Metoprolol 56m Plan: At goal,  patient stable/ symptoms controlled   DM Lab Results  Component Value Date   HGBA1C 8.8 (H) 03/11/2020   Lab Results  Component  Value Date   CREATININE 0.51 11/27/2020    Lab Results  Component Value Date   NA 139 11/27/2020   K 4.0 11/27/2020   CREATININE 0.51 11/27/2020   GFRNONAA >60 11/27/2020   GLUCOSE 125 (H) 11/27/2020   Testing 3x/day: Highest recent: 120, normally 100-120 Lantus 25 Units May 2022 Plan: Glucose readings should match A1c goal, waiting for A1c to confirm June 2022: Patient was driving and didn't have glucose readings. Stated isn't going to have an A1c anytime soon because doesn't have a PCP. She wants to leave her current PCP due to issues with communication over the phone. Told her to please get a PCP ASAP and to keep a glucose log. Will call again in a month to make sure she got a PCP and has glucose readings July 2022: Sugars look great 12/01/20: 95 11/30/20 Dinner: 114 11/30/20 Lunch: 123 Breakfast: 156 11/29/20:99 Plan: At goal,  patient stable/ symptoms controlled   Misc: June: Patient doesn't have a PCP, recommended she get one ASAP to get labs and refills of meds July 2022: Patient still doesn't have a PCP, recommended she get one ASAP, insulin looks like it's out of refills   SDOH (Social Determinants of Health) assessments and interventions performed:    Care Plan  No Known Allergies  Medications Reviewed Today     Reviewed by GHeath Lark MD (Physician) on 11/25/20 at 1GrenoraList Status: <None>   Medication Order Taking? Sig Documenting Provider Last Dose Status Informant  ACCU-CHEK GUIDE test strip [id number]  [provider] Taking Active Self  Accu-Chek Softclix Lancets lancets [id number] Yes 1 each 3 (three) times daily. [provider] Taking Active Self  acyclovir (ZOVIRAX) 800 MG tablet [id number] Yes Take by mouth. [provider] Taking Active   amLODipine (NORVASC) 10 MG tablet [id number]  Take 1 tablet (10 mg total) by mouth daily. [name], MD  Active   aspirin 325 MG tablet [id number] Yes Take 325 mg by mouth daily. [provider]  Active   Blood Glucose Monitoring Suppl (ACCU-CHEK GUIDE) w/Device KIT [id number] Yes  [provider] Taking Active Self  calcium carbonate (OS-CAL) 1250 (500 Ca) MG chewable tablet [id number] Yes Chew 1 tablet by mouth daily. [provider]  Active   cholecalciferol (VITAMIN D3) 25 MCG (1000 UNIT) tablet [id number] Yes Take 2,000 Units by mouth daily. [provider]  Active   folic acid (FOLVITE) 1 MG tablet [id number] Yes Take 1 mg by mouth daily. [provider] Taking Active   LANTUS SOLOSTAR 100 UNIT/ML Solostar Pen [id number] Yes Inject 25 Units into the skin at bedtime.  Patient taking differently: Inject 28-30 Units into the skin at bedtime.   [name], MD Taking Active Self           Med Note (ROBB, MELANIE A   Tue Oct 06, 2020 10:42 AM) Taking 30 units at bedtime  lenalidomide (REVLIMID) 10 MG capsule [id number] Yes [employer] Auth #      Date Obtained  Take 1 capsule daily for 21 days, then take 7 days off [name], MD  Active   metoprolol tartrate (LOPRESSOR) 25 MG tablet [id number] Yes Take 1 tablet (25 mg total) by mouth daily. [name], MD Taking Active   Multiple Vitamin (MULTIVITAMIN) tablet [id number] Yes Take 1 tablet by mouth daily. [provider] Taking Active   ondansetron (ZOFRAN) 8 MG tablet [id number] No Take by mouth.  Patient not taking: Reported on 11/25/2020   [provider] Not Taking Active   prochlorperazine (COMPAZINE) 10 MG tablet [id number] No Take by mouth.  Patient not taking: Reported on 11/25/2020   [provider] Not Taking Active   Med List Note [name], CPhT 03/07/13 1442): No preferred pharmacy.             Patient Active Problem List   Diagnosis Date Noted   T1DM (type 1 diabetes mellitus) ([place]) 06/11/2020   Multiple myeloma ([place]) 03/09/2020   Goals of care, counseling/discussion 03/09/2020   Pregnancy of unknown anatomic location  05/27/2019   Chronic hypertension 05/24/2019    Conditions to be addressed/monitored: HTN, Hypertriglyceridemia and DM  Care Plan : Medication Management  Updates made by Lane Hacker, [place] since 10/08/2020 12:00 AM     Problem: Health Promotion or Disease Self-Management (General Plan of Care)      Goal: Medication Management   Note:   Current Barriers:  Unable to achieve control of DM    Pharmacist Clinical Goal(s):  Over the next 30 days, patient will achieve control of DM as evidenced by current glucose levels  through collaboration with PharmD and provider.    Interventions: Inter-disciplinary care team collaboration (see longitudinal plan of care) Comprehensive medication review performed; medication list updated in electronic medical record  _0 @ _1 @ _2 @  Patient Goals/Self-Care Activities Over the next 30 days, patient will:  - take medications as prescribed check glucose Daily, document, and provide at future appointments  Follow Up Plan: The care management team will reach out to the patient again over the  next 30 days.      Task: Mutually Develop and Royce Macadamia Achievement of Patient Goals   Note:   Care Management Activities:    - verbalization of feelings encouraged    Notes:      Medication Assistance: None required. Patient affirms current coverage meets needs.   Follow up: Agree   Plan: The care management team will reach out to the patient again over the next 60 days.   [place] Constable, Pharm.D., Managed Medicaid Pharmacist - [phone]

## 2020-12-01 NOTE — Patient Instructions (Signed)
Visit Information  Diamond Robbins was given information about Medicaid Managed Care team care coordination services as a part of their [facility] Medicaid benefit. Diamond Robbins verbally consented to engagement with the [facility] Managed Care team.   For questions related to your [facility] health plan, please call: [phone] or go here:[email or web address]  If you would like to schedule transportation through your [facility] plan, please call the following number at least 2 days in advance of your appointment: [phone].  Call the [place] at [phone], at any time, 24 hours a day, 7 days a week. If you are in danger or need immediate medical attention call 911.  If you would like help to quit smoking, call 1-800-QUIT-NOW 418-574-8177) OR Espaol: 1-855-Djelo-Ya (2-774-128-7867) o para ms informacin haga clic aqu or Text READY to 200-400 to register via text  Diamond Robbins - following are the goals we discussed in your visit today:   Goals Addressed   None     Please see education materials related to DM provided as print materials.   Patient verbalizes understanding of instructions provided today.   The Managed Medicaid care management team will reach out to the patient again over the next 30 days.   Diamond Robbins, Pharm.D., Managed Medicaid Pharmacist [phone]   Following is a copy of your plan of care:  Patient Care Plan: Diabetes Type 1 (Adult)     Problem Identified: Glycemic Management (Diabetes, Type 1)      Long-Range Goal: Glycemic Management Optimized Completed 10/06/2020  Start Date: 06/17/2020  Expected End Date: 10/06/2020  This Visit's Progress: On track  Recent Progress: On track  Priority: High  Note:   CARE PLAN ENTRY Medicaid Managed Care (see longtitudinal plan of care for additional care plan information)  Objective:  Lab Results  Component Value Date   HGBA1C 8.8 (H) 03/11/2020   Lab Results   Component Value Date   CREATININE 0.47 08/21/2020   CREATININE 0.46 06/22/2020   CREATININE 0.53 06/09/2020  Patient reported cbg findings: postprandial readings typically 90-140  Current Barriers:  Knowledge Deficits related to basic Diabetes pathophysiology and self care/management-[name] has a recent diagnosis of Type 1 Diabetes Mellitis and Multiple Myeloma. Treatment for MM is 2 weeks on and 1 week off. During her treatment weeks her BS readings are higher than usual due to her receiving steroids. She reports that at the time of diagnosis she received very helpful diabetic education. She is managing DM with diet and insulin. She is scheduled for a bone marrow transplant at [facility]. Due to a +COVID test on 2/14, bone marrow transplant will be rescheduled. Diamond Robbins will have her wisdom teeth removed on 4/7 and transplant scheduled to begin 09/09/20. 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 She reports doing well. She understands that she needs to wear her mask and gloves in public, avoid crowded places, and no visitors for 100 days per provider. Patient reports improved blood sugar readings since BMT. Patient aware of follow up appointments and denies any needs at this time. Case Manager Clinical Goal(s):  patient will demonstrate improved adherence to prescribed treatment plan for diabetes self care/management as evidenced by:  daily monitoring and recording of CBG-met adherence to ADA/ carb modified diet-met exercise 3-5 days/week-met adherence to prescribed medication regimen-met Interventions:  Provided education to patient about basic DM diet Reviewed medications with patient and discussed importance of medication adherence Discussed plans with patient for ongoing care management follow up and  provided patient with direct contact information for care management team Reviewed scheduled/upcoming provider appointments  Review of patient status, including review of consultants  reports, relevant laboratory and other test results, and medications completed. Discussed follow up after bone marrow transplant, verified that Diamond Robbins had contact information for her Navigator at [facility] should any questions arise Patient Self Care Activities:  - call to cancel if needed - keep a calendar with prescription refill dates - keep a calendar with appointment dates  - schedule a follow up appointment with PCP for evaluation of diabetes and HTN - check blood sugar at prescribed times - check blood sugar if I feel it is too high or too low - enter blood sugar readings and medication or insulin into daily log - take the blood sugar log to all doctor visits - take the blood sugar meter to all doctor visits - adhere to a diabetic diet - schedule appointment with eye doctor   Please see past updates related to this goal by clicking on the "Past Updates" button in the selected goal     Patient Care Plan: Medication Management     Problem Identified: Health Promotion or Disease Self-Management (General Plan of Care)      Goal: Medication Management   Note:   Current Barriers:  Unable to achieve control of DM    Pharmacist Clinical Goal(s):  Over the next 30 days, patient will achieve control of DM as evidenced by current glucose levels  through collaboration with PharmD and provider.  Patient will find and schedule a visit with a PCP  Interventions: Inter-disciplinary care team collaboration (see longitudinal plan of care) Comprehensive medication review performed; medication list updated in electronic medical record  _0 @ _1 @ _2 @  Patient Goals/Self-Care Activities Over the next 30 days, patient will:  - take medications as prescribed check glucose Daily, document, and provide at future appointments  Follow Up Plan: The care management team will reach out to the patient again over the next 30 days.      Task: Mutually  Develop and Royce Macadamia Achievement of Patient Goals   Note:   Care Management Activities:    - verbalization of feelings encouraged    Notes:

## 2020-12-08 ENCOUNTER — Other Ambulatory Visit: Payer: Self-pay | Admitting: *Deleted

## 2020-12-08 ENCOUNTER — Other Ambulatory Visit: Payer: Self-pay

## 2020-12-08 NOTE — Patient Outreach (Signed)
Medicaid Managed Care   Nurse Care Manager Note  12/08/2020 Name:  Montia Haslip MRN:  [id number] DOB:  [birth year]  Diamond Robbins is an 30 y.o. year old female who is a primary patient of [name], MD.  The [facility] Managed Care Coordination team was consulted for assistance with:    HTN  Ms. Flagler was given information about Medicaid Managed Care Coordination team services today. [name] agreed to services and verbal consent obtained.  Engaged with patient by telephone for follow up visit in response to provider referral for case management and/or care coordination services.   Assessments/Interventions:  Review of past medical history, allergies, medications, health status, including review of consultants reports, laboratory and other test data, was performed as part of comprehensive evaluation and provision of chronic care management services.  SDOH (Social Determinants of Health) assessments and interventions performed: SDOH Interventions    Flowsheet Row Most Recent Value  SDOH Interventions   Food Insecurity Interventions Intervention Not Indicated  Housing Interventions Intervention Not Indicated  Transportation Interventions Intervention Not Indicated       Care Plan  No Known Allergies  Medications Reviewed Today     Reviewed by Melissa Montane, RN (Registered Nurse) on 12/08/20 at [place] List Status: <None>   Medication Order Taking? Sig Documenting Provider Last Dose Status Informant  ACCU-CHEK GUIDE test strip [id number] Yes  [provider] Taking Active Self  Accu-Chek Softclix Lancets lancets [id number] Yes 1 each 3 (three) times daily. [provider] Taking Active Self  acyclovir (ZOVIRAX) 800 MG tablet [id number] Yes Take by mouth. [provider] Taking Active   amLODipine (NORVASC) 10 MG tablet [id number] Yes Take 1 tablet (10 mg total) by mouth daily. [name], MD Taking Active   aspirin 325 MG tablet [id number]  Yes Take 325 mg by mouth daily. [provider] Taking Active   Blood Glucose Monitoring Suppl (ACCU-CHEK GUIDE) w/Device KIT [id number] Yes  [provider] Taking Active Self  calcium carbonate (OS-CAL) 1250 (500 Ca) MG chewable tablet [id number] Yes Chew 1 tablet by mouth daily. [provider] Taking Active   cholecalciferol (VITAMIN D3) 25 MCG (1000 UNIT) tablet [id number] Yes Take 2,000 Units by mouth daily. [provider] Taking Active   cholecalciferol (VITAMIN D3) 25 MCG (1000 UNIT) tablet [id number] Yes Take 1,000 Units by mouth daily. [provider]  Active   folic acid (FOLVITE) 1 MG tablet [id number] Yes Take 1 mg by mouth daily. [provider] Taking Active   LANTUS SOLOSTAR 100 UNIT/ML Solostar Pen [id number] Yes Inject 25 Units into the skin at bedtime.  Patient taking differently: Inject 28-30 Units into the skin at bedtime.   [name], MD Taking Active Self           Med Note (Byron Peacock A   Tue Oct 06, 2020 10:42 AM) Taking 30 units at bedtime  lenalidomide (REVLIMID) 10 MG capsule [id number] Yes Take 1 capsule daily for 21 days, then take 7 days off [name], MD Taking Active   metoprolol tartrate (LOPRESSOR) 25 MG tablet [id number] Yes Take 1 tablet (25 mg total) by mouth daily. [name], MD Taking Active   Multiple Vitamin (MULTIVITAMIN) tablet [id number] Yes Take 1 tablet by mouth daily. [provider] Taking Active   ondansetron (ZOFRAN) 8 MG tablet [id number] No Take by mouth.  Patient not taking: No sig reported   [provider] Not Taking Active   prochlorperazine (COMPAZINE) 10 MG tablet  [id number] No Take by mouth.  Patient not taking: No sig reported   [provider] Not Taking Active   Med List Note [name], CPhT 03/07/13 1442): No preferred pharmacy.             Patient Active Problem List   Diagnosis Date Noted   T1DM (type 1  diabetes mellitus) ([place]) 06/11/2020   Multiple myeloma ([place]) 03/09/2020   Goals of care, counseling/discussion 03/09/2020   Pregnancy of unknown anatomic location 05/27/2019   Chronic hypertension 05/24/2019    Conditions to be addressed/monitored per PCP order:  HTN  Care Plan : Hypertension (Adult)  Updates made by Melissa Montane, RN since 12/08/2020 12:00 AM     Problem: Hypertension (Hypertension)      Long-Range Goal: Hypertension Monitored   Start Date: 12/08/2020  Expected End Date: 02/08/2021  This Visit's Progress: On track  Priority: High  Note:   Objective:  Last practice recorded BP readings:  BP Readings from Last 3 Encounters:  11/25/20 (!) 144/104  08/27/20 121/83  08/21/20 130/83   Most recent eGFR/CrCl: No results found for: EGFR  No components found for: CRCL Current Barriers:  Knowledge Deficits related to understanding of medications prescribed for management of hypertension Unable to independently establish new PCP Case Manager Clinical Goal(s):  patient will verbalize understanding of plan for hypertension management patient will attend all scheduled medical appointments and call for new PCP appointment patient will demonstrate improved adherence to prescribed treatment plan for hypertension as evidenced by taking all medications as prescribed, monitoring and recording blood pressure as directed, adhering to low sodium/DASH diet Interventions:  Evaluation of current treatment plan related to hypertension self management and patient's adherence to plan as established by provider. Provided education to patient re: stroke prevention, s/s of heart attack and stroke, DASH diet, complications of uncontrolled blood pressure Reviewed medications with patient and discussed importance of compliance Discussed plans with patient for ongoing care management follow up and provided patient with direct contact information for care management team Advised patient,  providing education and rationale, to monitor blood pressure daily and record, calling PCP for findings outside established parameters.  Reviewed scheduled/upcoming provider appointments including: calling for new patient appointment with PCP of choice Provided information for local PCP office [facility] [phone] Patient Goals/Self-Care Activities:  - call [facility] [phone] for a PCP new patient appointment - check blood pressure weekly - choose a place to take my blood pressure (home, clinic or office, retail store) - write blood pressure results in a log or diary - take all medications as directed  - adhere to [place] will follow up on 01/07/21 @ 10:30am      Follow Up:  Patient agrees to Care Plan and Follow-up.  Plan: The Managed Medicaid care management team will reach out to the patient again over the next 30 days.  Date/time of next scheduled RN care management/care coordination outreach:  01/07/21 @ 10:30am  Lurena Joiner RN, [place] RN Care Coordinator

## 2020-12-08 NOTE — Patient Instructions (Signed)
Visit Information  Diamond Robbins was given information about Medicaid Managed Care team care coordination services as a part of their [facility] Medicaid benefit. Diamond Robbins verbally consented to engagement with the [facility] Managed Care team.   If you are experiencing a medical emergency, please call 911 or report to your local emergency department or urgent care.   If you have a non-emergency medical problem during routine business hours, please contact your provider's office and ask to speak with a nurse.   For questions related to your [facility] health plan, please call: [phone] or go here:[email or web address]  If you would like to schedule transportation through your [facility] plan, please call the following number at least 2 days in advance of your appointment: [phone].  Call the [place] at [phone], at any time, 24 hours a day, 7 days a week. If you are in danger or need immediate medical attention call 911.  If you would like help to quit smoking, call 1-800-QUIT-NOW 5627132695) OR Espaol: 1-855-Djelo-Ya (6-761-950-9326) o para Diamond informacin haga clic aqu or Text READY to 200-400 to register via text  Diamond Robbins - following are the goals we discussed in your visit today:   Goals Addressed             This Visit's Progress    Track and Manage My Blood Pressure-Hypertension       Timeframe:  Long-Range Goal Priority:  High Start Date:   12/08/20                          Expected End Date: 02/08/21                      Follow Up Date 01/07/21    - call [place] [phone] for a PCP new patient appointment - check blood pressure weekly - choose a place to take my blood pressure (home, clinic or office, retail store) - write blood pressure results in a log or diary - take all medications as directed  - adhere to dash diet   Why is this important?   You won't feel high blood pressure, but it  can still hurt your blood vessels.  High blood pressure can cause heart or kidney problems. It can also cause a stroke.  Making lifestyle changes like losing a little weight or eating less salt will help.  Checking your blood pressure at home and at different times of the day can help to control blood pressure.  If the doctor prescribes medicine remember to take it the way the doctor ordered.  Call the office if you cannot afford the medicine or if there are questions about it.     Notes:         Please see education materials related to hypertension provided by MyChart link.  Patient verbalizes understanding of instructions provided today.   Telephone follow up appointment with Managed Medicaid care management team member scheduled for:01/07/21 @ 10:30am  Lurena Joiner RN, BSN [place] RN Care Coordinator   Following is a copy of your plan of care:  Patient Care Plan: Diabetes Type 1 (Adult)     Problem Identified: Glycemic Management (Diabetes, Type 1)      Long-Range Goal: Glycemic Management Optimized Completed 10/06/2020  Start Date: 06/17/2020  Expected End Date: 10/06/2020  This Visit's Progress: On track  Recent Progress: On track  Priority: High  Note:  CARE PLAN ENTRY Medicaid Managed Care (see longtitudinal plan of care for additional care plan information)  Objective:  Lab Results  Component Value Date   HGBA1C 8.8 (H) 03/11/2020   Lab Results  Component Value Date   CREATININE 0.47 08/21/2020   CREATININE 0.46 06/22/2020   CREATININE 0.53 06/09/2020  Patient reported cbg findings: postprandial readings typically 90-140  Current Barriers:  Knowledge Deficits related to basic Diabetes pathophysiology and self care/management-Diamond Robbins has a recent diagnosis of Type 1 Diabetes Mellitis and Multiple Myeloma. Treatment for MM is 2 weeks on and 1 week off. During her treatment weeks her BS readings are higher than usual due to her  receiving steroids. She reports that at the time of diagnosis she received very helpful diabetic education. She is managing DM with diet and insulin. She is scheduled for a bone marrow transplant at [facility]. Due to a +COVID test on 2/14, bone marrow transplant will be rescheduled. Diamond Robbins will have her wisdom teeth removed on 4/7 and transplant scheduled to begin 09/09/20. 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 She reports doing well. She understands that she needs to wear her mask and gloves in public, avoid crowded places, and no visitors for 100 days per provider. Patient reports improved blood sugar readings since BMT. Patient aware of follow up appointments and denies any needs at this time. Case Manager Clinical Goal(s):  patient will demonstrate improved adherence to prescribed treatment plan for diabetes self care/management as evidenced by:  daily monitoring and recording of CBG-met adherence to ADA/ carb modified diet-met exercise 3-5 days/week-met adherence to prescribed medication regimen-met Interventions:  Provided education to patient about basic DM diet Reviewed medications with patient and discussed importance of medication adherence Discussed plans with patient for ongoing care management follow up and provided patient with direct contact information for care management team Reviewed scheduled/upcoming provider appointments  Review of patient status, including review of consultants reports, relevant laboratory and other test results, and medications completed. Discussed follow up after bone marrow transplant, verified that Diamond Robbins had contact information for her Navigator at [facility] should any questions arise Patient Self Care Activities:  - call to cancel if needed - keep a calendar with prescription refill dates - keep a calendar with appointment dates  - schedule a follow up appointment with PCP for evaluation of diabetes and HTN - check blood sugar at prescribed  times - check blood sugar if I feel it is too high or too low - enter blood sugar readings and medication or insulin into daily log - take the blood sugar log to all doctor visits - take the blood sugar meter to all doctor visits - adhere to a diabetic diet - schedule appointment with eye doctor   Please see past updates related to this goal by clicking on the "Past Updates" button in the selected goal     Patient Care Plan: Medication Management     Problem Identified: Health Promotion or Disease Self-Management (General Plan of Care)      Goal: Medication Management   Note:   Current Barriers:  Unable to achieve control of DM    Pharmacist Clinical Goal(s):  Over the next 30 days, patient will achieve control of DM as evidenced by current glucose levels  through collaboration with PharmD and provider.  Patient will find and schedule a visit with a PCP  Interventions: Inter-disciplinary care team collaboration (see longitudinal plan of care) Comprehensive medication review performed; medication list  updated in electronic medical record    Patient Goals/Self-Care Activities Over the next 30 days, patient will:  - take medications as prescribed check glucose Daily, document, and provide at future appointments  Follow Up Plan: The care management team will reach out to the patient again over the next 30 days.       Patient Care Plan: Hypertension (Adult)     Problem Identified: Hypertension (Hypertension)      Long-Range Goal: Hypertension Monitored   Start Date: 12/08/2020  Expected End Date: 02/08/2021  This Visit's Progress: On track  Priority: High  Note:   Objective:  Last practice recorded BP readings:  BP Readings from Last 3 Encounters:  11/25/20 (!) 144/104  08/27/20 121/83  08/21/20 130/83   Most recent eGFR/CrCl: No results found for: EGFR  No components found for: CRCL Current Barriers:  Knowledge Deficits related to understanding of medications  prescribed for management of hypertension Unable to independently establish new PCP Case Manager Clinical Goal(s):  patient will verbalize understanding of plan for hypertension management patient will attend all scheduled medical appointments and call for new PCP appointment patient will demonstrate improved adherence to prescribed treatment plan for hypertension as evidenced by taking all medications as prescribed, monitoring and recording blood pressure as directed, adhering to low sodium/DASH diet Interventions:  Evaluation of current treatment plan related to hypertension self management and patient's adherence to plan as established by provider. Provided education to patient re: stroke prevention, s/s of heart attack and stroke, DASH diet, complications of uncontrolled blood pressure Reviewed medications with patient and discussed importance of compliance Discussed plans with patient for ongoing care management follow up and provided patient with direct contact information for care management team Advised patient, providing education and rationale, to monitor blood pressure daily and record, calling PCP for findings outside established parameters.  Reviewed scheduled/upcoming provider appointments including: calling for new patient appointment with PCP of choice Provided information for local PCP office [facility] [phone] Patient Goals/Self-Care Activities:  - call [facility] [phone] for a PCP new patient appointment - check blood pressure weekly - choose a place to take my blood pressure (home, clinic or office, retail store) - write blood pressure results in a log or diary - take all medications as directed  - adhere to [place] will follow up on 01/07/21 @ 10:30am

## 2020-12-14 DIAGNOSIS — Z419 Encounter for procedure for purposes other than remedying health state, unspecified: Secondary | ICD-10-CM | POA: Diagnosis not present

## 2020-12-15 DIAGNOSIS — Z9484 Stem cells transplant status: Secondary | ICD-10-CM | POA: Diagnosis not present

## 2020-12-15 DIAGNOSIS — C9000 Multiple myeloma not having achieved remission: Secondary | ICD-10-CM | POA: Diagnosis not present

## 2020-12-22 ENCOUNTER — Inpatient Hospital Stay (HOSPITAL_COMMUNITY): Payer: Medicaid Other | Attending: Hematology

## 2020-12-22 ENCOUNTER — Other Ambulatory Visit: Payer: Self-pay

## 2020-12-22 DIAGNOSIS — N179 Acute kidney failure, unspecified: Secondary | ICD-10-CM | POA: Insufficient documentation

## 2020-12-22 DIAGNOSIS — I10 Essential (primary) hypertension: Secondary | ICD-10-CM | POA: Diagnosis not present

## 2020-12-22 DIAGNOSIS — E109 Type 1 diabetes mellitus without complications: Secondary | ICD-10-CM | POA: Insufficient documentation

## 2020-12-22 DIAGNOSIS — C9000 Multiple myeloma not having achieved remission: Secondary | ICD-10-CM

## 2020-12-22 DIAGNOSIS — D472 Monoclonal gammopathy: Secondary | ICD-10-CM

## 2020-12-22 LAB — COMPREHENSIVE METABOLIC PANEL
ALT: 39 U/L (ref 0–44)
AST: 25 U/L (ref 15–41)
Albumin: 5.1 g/dL — ABNORMAL HIGH (ref 3.5–5.0)
Alkaline Phosphatase: 93 U/L (ref 38–126)
Anion gap: 12 (ref 5–15)
BUN: 11 mg/dL (ref 6–20)
CO2: 25 mmol/L (ref 22–32)
Calcium: 10.3 mg/dL (ref 8.9–10.3)
Chloride: 103 mmol/L (ref 98–111)
Creatinine, Ser: 0.57 mg/dL (ref 0.44–1.00)
GFR, Estimated: >60 mL/min (ref >60)
Glucose, Bld: 105 mg/dL — ABNORMAL HIGH (ref 70–99)
Potassium: 3.6 mmol/L (ref 3.5–5.1)
Sodium: 140 mmol/L (ref 135–145)
Total Bilirubin: 0.6 mg/dL (ref 0.3–1.2)
Total Protein: 8.6 g/dL — ABNORMAL HIGH (ref 6.5–8.1)

## 2020-12-22 LAB — CBC WITH DIFFERENTIAL/PLATELET
Abs Immature Granulocytes: 0.04 10*3/uL (ref 0.00–0.07)
Basophils Absolute: 0 10*3/uL (ref 0.0–0.1)
Basophils Relative: 0 %
Eosinophils Absolute: 0.2 10*3/uL (ref 0.0–0.5)
Eosinophils Relative: 4 %
HCT: 44 % (ref 36.0–46.0)
Hemoglobin: 14.2 g/dL (ref 12.0–15.0)
Immature Granulocytes: 1 %
Lymphocytes Relative: 23 %
Lymphs Abs: 1.1 10*3/uL (ref 0.7–4.0)
MCH: 28.5 pg (ref 26.0–34.0)
MCHC: 32.3 g/dL (ref 30.0–36.0)
MCV: 88.2 fL (ref 80.0–100.0)
Monocytes Absolute: 0.5 10*3/uL (ref 0.1–1.0)
Monocytes Relative: 10 %
Neutro Abs: 2.9 10*3/uL (ref 1.7–7.7)
Neutrophils Relative %: 62 %
Platelets: 306 10*3/uL (ref 150–400)
RBC: 4.99 MIL/uL (ref 3.87–5.11)
RDW: 12.7 % (ref 11.5–15.5)
WBC: 4.8 10*3/uL (ref 4.0–10.5)
nRBC: 0 % (ref 0.0–0.2)

## 2020-12-22 LAB — LACTATE DEHYDROGENASE: LDH: 153 U/L (ref 98–192)

## 2020-12-22 LAB — MAGNESIUM: Magnesium: 2.1 mg/dL (ref 1.7–2.4)

## 2020-12-22 LAB — PREGNANCY, URINE: Preg Test, Ur: NEGATIVE

## 2020-12-23 ENCOUNTER — Other Ambulatory Visit (HOSPITAL_COMMUNITY): Payer: Self-pay

## 2020-12-23 LAB — KAPPA/LAMBDA LIGHT CHAINS
Kappa free light chain: 8.4 mg/L (ref 3.3–19.4)
Kappa, lambda light chain ratio: 0.88 (ref 0.26–1.65)
Lambda free light chains: 9.6 mg/L (ref 5.7–26.3)

## 2020-12-23 MED ORDER — LENALIDOMIDE 10 MG PO CAPS: ORAL_CAPSULE | ORAL | 0 refills | Status: DC

## 2020-12-23 NOTE — Telephone Encounter (Signed)
Chart reviewed. Revlimid refilled per last office note with Dr. Alvy Bimler. Negative pregnancy test faxed to [employer].

## 2020-12-24 LAB — PROTEIN ELECTROPHORESIS, SERUM
A/G Ratio: 1.2 (ref 0.7–1.7)
Albumin ELP: 4.5 g/dL — ABNORMAL HIGH (ref 2.9–4.4)
Alpha-1-Globulin: 0.2 g/dL (ref 0.0–0.4)
Alpha-2-Globulin: 1.2 g/dL — ABNORMAL HIGH (ref 0.4–1.0)
Beta Globulin: 1.5 g/dL — ABNORMAL HIGH (ref 0.7–1.3)
Gamma Globulin: 0.8 g/dL (ref 0.4–1.8)
Globulin, Total: 3.7 g/dL (ref 2.2–3.9)
Total Protein ELP: 8.2 g/dL (ref 6.0–8.5)

## 2020-12-27 NOTE — Progress Notes (Signed)
Diamond Robbins, [place] [id number]   CLINIC:  Medical Oncology/Hematology  PCP:  [name], MD [place] / [place] [phone]   REASON FOR VISIT:  Follow-up for IgA kappa multiple myeloma  PRIOR THERAPY: none  NGS Results: not done  CURRENT THERAPY: RVD and Darzalex Faspro on D1, 4, 8, 11, and 15; Revlimid 2 weeks on, 1 week off  BRIEF ONCOLOGIC HISTORY:  Oncology History  Multiple myeloma ([place])  03/09/2020 Initial Diagnosis   Multiple myeloma ([place])   03/31/2020 - 06/12/2020 Chemotherapy            CANCER STAGING: Cancer Staging No matching staging information was found for the patient.  INTERVAL HISTORY:  [name], a 30 y.o. female, returns for routine follow-up of her IgA kappa multiple myeloma. Diamond Robbins was last seen on 06/23/20.   Today she reports feeling well. She is taking Revlimid and tolerating it well. She does not check her blood pressure at home. She denies fatigue and n/v/d. She is currently taking calcium and vitamin D. She denies any current dental issues.   REVIEW OF SYSTEMS:  Review of Systems  Constitutional:  Negative for appetite change and fatigue.  Gastrointestinal:  Negative for diarrhea, nausea and vomiting.  All other systems reviewed and are negative.  PAST MEDICAL/SURGICAL HISTORY:  Past Medical History:  Diagnosis Date   Cancer ([place]) 08/01/2020   multiple myeloma   DKA (diabetic ketoacidosis) ([place]) 02/23/2020   Hypertension    Type 1 diabetes mellitus ([place]) 02/23/2020   Past Surgical History:  Procedure Laterality Date   NO PAST SURGERIES      SOCIAL HISTORY:  Social History   Socioeconomic History   Marital status: Single    Spouse name: Not on file   Number of children: Not on file   Years of education: Not on file   Highest education level: Not on file  Occupational History   Occupation: unemployed  Tobacco Use   Smoking status: Never    Smokeless tobacco: Never  Vaping Use   Vaping Use: Never used  Substance and Sexual Activity   Alcohol use: Not Currently    Comment: occ   Drug use: No   Sexual activity: Yes    Birth control/protection: Condom  Other Topics Concern   Not on file  Social History Narrative   Pt is engaged   Social Determinants of Radio broadcast assistant Strain: Low Risk    Difficulty of Paying Living Expenses: Not hard at all  Food Insecurity: No Food Insecurity   Worried About Charity fundraiser in the Last Year: Never true   Ran Out of Food in the Last Year: Never true  Transportation Needs: No Transportation Needs   Lack of Transportation (Medical): No   Lack of Transportation (Non-Medical): No  Physical Activity: Insufficiently Active   Days of Exercise per Week: 3 days   Minutes of Exercise per Session: 10 min  Stress: No Stress Concern Present   Feeling of Stress : Not at all  Social Connections: Moderately Integrated   Frequency of Communication with Friends and Family: More than three times a week   Frequency of Social Gatherings with Friends and Family: More than three times a week   Attends Religious Services: More than 4 times per year   Active Member of [employer] or Organizations: No   Attends Archivist Meetings: Never   Marital Status: Married  Intimate  Partner Violence: Not At Risk   Fear of Current or Ex-Partner: No   Emotionally Abused: No   Physically Abused: No   Sexually Abused: No    FAMILY HISTORY:  Family History  Problem Relation Age of Onset   Hypertension Mother    Diabetes Mother    Hypertension Father    Diabetes Maternal Grandmother     CURRENT MEDICATIONS:  Current Outpatient Medications  Medication Sig Dispense Refill   ACCU-CHEK GUIDE test strip      Accu-Chek Softclix Lancets lancets 1 each 3 (three) times daily.     acyclovir (ZOVIRAX) 800 MG tablet Take by mouth.     amLODipine (NORVASC) 10 MG tablet Take 1 tablet (10 mg total)  by mouth daily. 30 tablet 1   aspirin 325 MG tablet Take 325 mg by mouth daily.     Blood Glucose Monitoring Suppl (ACCU-CHEK GUIDE) w/Device KIT      calcium carbonate (OS-CAL) 1250 (500 Ca) MG chewable tablet Chew 1 tablet by mouth daily.     cholecalciferol (VITAMIN D3) 25 MCG (1000 UNIT) tablet Take 2,000 Units by mouth daily.     cholecalciferol (VITAMIN D3) 25 MCG (1000 UNIT) tablet Take 1,000 Units by mouth daily.     folic acid (FOLVITE) 1 MG tablet Take 1 mg by mouth daily.     LANTUS SOLOSTAR 100 UNIT/ML Solostar Pen Inject 25 Units into the skin at bedtime. (Patient taking differently: Inject 28-30 Units into the skin at bedtime.) 15 mL 0   lenalidomide (REVLIMID) 10 MG capsule Take 1 capsule daily for 21 days, then take 7 days off 21 capsule 0   metoprolol tartrate (LOPRESSOR) 25 MG tablet Take 1 tablet (25 mg total) by mouth daily. 30 tablet 1   Multiple Vitamin (MULTIVITAMIN) tablet Take 1 tablet by mouth daily.     ondansetron (ZOFRAN) 8 MG tablet Take by mouth. (Patient not taking: No sig reported)     prochlorperazine (COMPAZINE) 10 MG tablet Take by mouth. (Patient not taking: No sig reported)     No current facility-administered medications for this visit.    ALLERGIES:  No Known Allergies  PHYSICAL EXAM:  Performance status (ECOG): 1 - Symptomatic but completely ambulatory  There were no vitals filed for this visit. Wt Readings from Last 3 Encounters:  11/25/20 160 lb 9.6 oz (72.8 kg)  08/14/20 165 lb 6.4 oz (75 kg)  08/07/20 163 lb 9.6 oz (74.2 kg)   Physical Exam Vitals reviewed.  Constitutional:      Appearance: Normal appearance.  Cardiovascular:     Rate and Rhythm: Normal rate and regular rhythm.     Pulses: Normal pulses.     Heart sounds: Normal heart sounds.  Pulmonary:     Effort: Pulmonary effort is normal.     Breath sounds: Normal breath sounds.  Neurological:     General: No focal deficit present.     Mental Status: She is alert and oriented  to person, place, and time.  Psychiatric:        Mood and Affect: Mood normal.        Behavior: Behavior normal.     LABORATORY DATA:  I have reviewed the labs as listed.  CBC Latest Ref Rng & Units 12/22/2020 11/27/2020 11/09/2020  WBC 4.0 - 10.5 K/uL 4.8 4.6 4.1  Hemoglobin 12.0 - 15.0 g/dL 14.2 13.5 12.4  Hematocrit 36.0 - 46.0 % 44.0 40.0 38.0  Platelets 150 - 400 K/uL 306 333 310  CMP Latest Ref Rng & Units 12/22/2020 11/27/2020 11/09/2020  Glucose 70 - 99 mg/dL 105(H) 125(H) 184(H)  BUN 6 - 20 mg/dL _0 Creatinine 0.44 - 1.00 mg/dL 0.57 0.51 0.63  Sodium 135 - 145 mmol/L 140 139 140  Potassium 3.5 - 5.1 mmol/L 3.6 4.0 3.4(L)  Chloride 98 - 111 mmol/L 103 104 105  CO2 22 - 32 mmol/L _1 Calcium 8.9 - 10.3 mg/dL 10.3 10.2 9.8  Total Protein 6.5 - 8.1 g/dL 8.6(H) 8.0 7.2  Total Bilirubin 0.3 - 1.2 mg/dL 0.6 0.3 0.4  Alkaline Phos 38 - 126 U/L 93 79 72  AST 15 - 41 U/L _2 ALT 0 - 44 U/L 39 18 17    DIAGNOSTIC IMAGING:  I have independently reviewed the scans and discussed with the patient. No results found.   ASSESSMENT:  1.  Stage III IgA kappa multiple myeloma: -Presentation to [facility] in [place] with confusion and weakness. -Found to have diabetic ketoacidosis with newly diagnosed type 1 diabetes. -Also found to have acute renal failure and hypercalcemia. -Bone marrow biopsy on 02/25/2020 with flow cytometry with the population CD138 positive, CD56 positive IgA kappa monoclonal plasma cells 12%.  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 -Ultrasound abdomen on 02/23/2020 showed increased hepatic echogenicity with normal spleen. -Renal ultrasound on 02/24/2020 was normal. -CT chest on 02/22/2020 with moth eaten appearance of the bones. -Beta-2 microglobulin 2.1.  LDH 322.  SPEP and immunofixation were normal.   Kappa light chains are elevated at 36.8.  Lambda light chains 12.9 and ratio of 2.85. -24-hour urine immunofixation was negative.  M spike was negative. -PET scan on 03/23/2020 showed no FDG radiotracer activity, no soft tissue plasmacytoma.  Diffuse multiple small lytic lesions within the pelvis, spine and vertebral bodies consistent with myeloma. -Chromosome analysis was 67, XX.  Multiple myeloma FISH panel was normal. -She is considered high risk based on elevated LDH level. -4 cycles of Dara VRD from 03/31/2020 through 06/12/2020. 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 - BMBX on 12/15/2020 with variably normocellular 30 to 70% with trilineage hematopoiesis.  No increase in plasma cells.  Myeloma FISH panel was normal.  Chromosome analysis was normal. - Maintenance Revlimid 10 mg 3 weeks on/1 week off started around 12/08/2020.   2.  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  Non-smoker. -No family history of myeloma or other malignancies.   PLAN:  1.  Stage III IgA kappa multiple myeloma: -She was started on maintenance Revlimid around 12/08/2020. - She is tolerating Revlimid very well.  She has 2 more pills left from the first bottle. - Discussed bone marrow biopsy results with the patient.  I do not have MRD results to discuss with her. - Reviewed myeloma panel from 12/22/2020 which showed negative SPEP and normal free light chains ratio.  Immunofixation was unremarkable. - We will follow-up on the MRD results from [facility] bone marrow biopsy. - She will start her next cycle around 23rd of this month.  RTC 5 weeks with labs.   2.  Type I diabetes: -Continue Lantus 30 units at bedtime.   3.  Hypertension: -Continue Norvasc and metoprolol.  This is well controlled.   4.  Bone protection:  -We discussed various side effects of Zometa including hypocalcemia and rare chance of ONJ. - Her renal function is  adequate to proceed with her dose today.   Orders  placed this encounter:  No orders of the defined types were placed in this encounter.    [name], MD [place] [phone]   I, Thana Ates, am acting as a scribe for Dr. [name].  I, [name] MD, have reviewed the above documentation for accuracy and completeness, and I agree with the above.

## 2020-12-28 ENCOUNTER — Inpatient Hospital Stay (HOSPITAL_BASED_OUTPATIENT_CLINIC_OR_DEPARTMENT_OTHER): Payer: Medicaid Other | Admitting: Hematology

## 2020-12-28 ENCOUNTER — Ambulatory Visit: Payer: Medicaid Other

## 2020-12-28 ENCOUNTER — Inpatient Hospital Stay (HOSPITAL_COMMUNITY): Payer: Medicaid Other

## 2020-12-28 ENCOUNTER — Other Ambulatory Visit: Payer: Self-pay

## 2020-12-28 ENCOUNTER — Other Ambulatory Visit (HOSPITAL_COMMUNITY): Payer: Medicaid Other

## 2020-12-28 VITALS — BP 117/81 | HR 94 | Temp 97.0°F | Resp 17

## 2020-12-28 VITALS — BP 133/95 | HR 97 | Temp 96.8°F | Resp 18 | Wt 168.2 lb

## 2020-12-28 DIAGNOSIS — C9000 Multiple myeloma not having achieved remission: Secondary | ICD-10-CM | POA: Diagnosis not present

## 2020-12-28 DIAGNOSIS — N179 Acute kidney failure, unspecified: Secondary | ICD-10-CM | POA: Diagnosis not present

## 2020-12-28 DIAGNOSIS — I10 Essential (primary) hypertension: Secondary | ICD-10-CM | POA: Diagnosis not present

## 2020-12-28 DIAGNOSIS — E109 Type 1 diabetes mellitus without complications: Secondary | ICD-10-CM | POA: Diagnosis not present

## 2020-12-28 LAB — IMMUNOFIXATION ELECTROPHORESIS
IgA: 23 mg/dL — ABNORMAL LOW (ref 87–352)
IgG (Immunoglobin G), Serum: 709 mg/dL (ref 586–1602)
IgM (Immunoglobulin M), Srm: 19 mg/dL — ABNORMAL LOW (ref 26–217)
Total Protein ELP: 7.5 g/dL (ref 6.0–8.5)

## 2020-12-28 MED ORDER — ZOLEDRONIC ACID 4 MG/100ML IV SOLN
4.0000 mg | Freq: Once | INTRAVENOUS | Status: AC
  Administered 2020-12-28: 4 mg via INTRAVENOUS
  Filled 2020-12-28: qty 100

## 2020-12-28 MED ORDER — SODIUM CHLORIDE 0.9 % IV SOLN: Freq: Once | INTRAVENOUS | Status: AC

## 2020-12-28 NOTE — Progress Notes (Signed)
Will proceed with Zometa infusion today per MD. This is her first dose.   Zometa infusion given per orders. Patient tolerated it well without problems. Vitals stable and discharged home from clinic ambulatory. Follow up as scheduled.

## 2020-12-28 NOTE — Progress Notes (Signed)
Patient has been assessed, vital signs and labs have been reviewed by Dr. Katragadda. ANC, Creatinine, LFTs, and Platelets are within treatment parameters per Dr. Katragadda. The patient is good to proceed with treatment at this time. Primary RN and pharmacy aware.  

## 2020-12-28 NOTE — Patient Instructions (Addendum)
[place] at [facility] Discharge Instructions  You were seen today by Dr. Delton Coombes. He went over your recent results, and you received your injection. Dr. Delton Coombes will see you back in 5 weeks for labs and follow up.   Thank you for choosing [place] at [facility] to provide your oncology and hematology care.  To afford each patient quality time with our provider, please arrive at least 15 minutes before your scheduled appointment time.   If you have a lab appointment with the [facility] please come in thru the Main Entrance and check in at the main information desk  You need to re-schedule your appointment should you arrive 10 or more minutes late.  We strive to give you quality time with our providers, and arriving late affects you and other patients whose appointments are after yours.  Also, if you no show three or more times for appointments you may be dismissed from the clinic at the providers discretion.     Again, thank you for choosing [facility].  Our hope is that these requests will decrease the amount of time that you wait before being seen by our physicians.       _____________________________________________________________  Should you have questions after your visit to [facility], please contact our office at [phone] between the hours of 8:00 a.m. and 4:30 p.m.  Voicemails left after 4:00 p.m. will not be returned until the following business day.  For prescription refill requests, have your pharmacy contact our office and allow 72 hours.    Cancer Center Support Programs:   > Cancer Support Group  2nd Tuesday of the month 1pm-2pm, Journey Room

## 2020-12-28 NOTE — Patient Instructions (Signed)
[facility]  Discharge Instructions: Thank you for choosing [facility] to provide your oncology and hematology care.  If you have a lab appointment with the Cancer Center, please come in thru the Main Entrance and check in at the main information desk.     We strive to give you quality time with your provider. You may need to reschedule your appointment if you arrive late (15 or more minutes).  Arriving late affects you and other patients whose appointments are after yours.  Also, if you miss three or more appointments without notifying the office, you may be dismissed from the clinic at the provider's discretion.      For prescription refill requests, have your pharmacy contact our office and allow 72 hours for refills to be completed.     Should you have questions after your visit or need to cancel or reschedule your appointment, please contact [facility] [phone]  and follow the prompts.  Office hours are 8:00 a.m. to 4:30 p.m. Monday - Friday. Please note that voicemails left after 4:00 p.m. may not be returned until the following business day.  We are closed weekends and major holidays. You have access to a nurse at all times for urgent questions. Please call the main number to the clinic [phone] and follow the prompts.  For any non-urgent questions, you may also contact your provider using MyChart. We now offer e-Visits for anyone 18 and older to request care online for non-urgent symptoms. For details visit [email or web address].   Also download the MyChart app! Go to the app store, search "MyChart", open the app, select [place], and log in with your MyChart username and password.  Due to Covid, a mask is required upon entering the hospital/clinic. If you do not have a mask, one will be given to you upon arrival. For doctor visits, patients may have 1 support person aged 18 or older with them. For treatment visits, patients cannot have  anyone with them due to current Covid guidelines and our immunocompromised population.  

## 2021-01-04 ENCOUNTER — Other Ambulatory Visit: Payer: Self-pay

## 2021-01-04 NOTE — Patient Outreach (Signed)
Medicaid Managed Care    Pharmacy Note  01/04/2021 Name: Diamond Robbins MRN: [id number] DOB: 03/29/1991  Diamond Robbins is a 30 y.o. year old female who is a primary care patient of [name], MD. The [facility] Managed Care Coordination team was consulted for assistance with disease management and care coordination needs.    Engaged with patient Engaged with patient by telephone for follow up visit in response to referral for case management and/or care coordination services.  Diamond Robbins was given information about Managed Medicaid Care Coordination team services today. [name] agreed to services and verbal consent obtained.   Objective:  Lab Results  Component Value Date   CREATININE 0.57 12/22/2020   CREATININE 0.51 11/27/2020   CREATININE 0.63 11/09/2020    Lab Results  Component Value Date   HGBA1C 8.8 (H) 03/11/2020    No results found for: CHOL, TRIG, HDL, CHOLHDL, VLDL, LDLCALC, LDLDIRECT  Other: (TSH, CBC, Vit D, etc.)  Clinical ASCVD: No  The ASCVD Risk score [name] DC Jr., et al., 2013) failed to calculate for the following reasons:   The 2013 ASCVD risk score is only valid for ages 38 to 44    Other: (CHADS2VASc if Afib, PHQ9 if depression, MMRC or CAT for COPD, ACT, DEXA)  BP Readings from Last 3 Encounters:  12/28/20 117/81  12/28/20 (!) 133/95  11/25/20 (!) 144/104    Assessment/Interventions: Review of patient past medical history, allergies, medications, health status, including review of consultants reports, laboratory and other test data, was performed as part of comprehensive evaluation and provision of chronic care management services.   Chemotherapy Acyclovir Plan: At goal,  patient stable/ symptoms controlled   HTN Amlodipine (Hold per internist due to hypotension) Metoprolol 46m Plan: At goal,  patient stable/ symptoms controlled   DM Lab Results  Component Value Date   HGBA1C 8.8 (H) 03/11/2020   Lab Results  Component  Value Date   CREATININE 0.57 12/22/2020    Lab Results  Component Value Date   NA 140 12/22/2020   K 3.6 12/22/2020   CREATININE 0.57 12/22/2020   GFRNONAA >60 12/22/2020   GLUCOSE 105 (H) 12/22/2020   Testing 3x/day: Highest recent: 120, normally 100-120 Lantus 25 Units May 2022 Plan: Glucose readings should match A1c goal, waiting for A1c to confirm June 2022: Patient was driving and didn't have glucose readings. Stated isn't going to have an A1c anytime soon because doesn't have a PCP. She wants to leave her current PCP due to issues with communication over the phone. Told her to please get a PCP ASAP and to keep a glucose log. Will call again in a month to make sure she got a PCP and has glucose readings July 2022: Sugars look great 12/01/20: 95 11/30/20 Dinner: 114 11/30/20 Lunch: 123 Breakfast: 156 11/29/20:99 August 2022: Sugars in 110's-120's, never higher than 130. She was able to get refills of insulin. Has PCP appt with [name] 01/14/21.   Misc: -Not interested in [facility] at this point June: Patient doesn't have a PCP, recommended she get one ASAP to get labs and refills of meds July 2022: Patient still doesn't have a PCP, recommended she get one ASAP, insulin looks like it's out of refills August 2022: Has PCP appt with [name] 01/14/21.  SDOH (Social Determinants of Health) assessments and interventions performed:    Care Plan  No Known Allergies  Medications Reviewed Today     Reviewed by RMelissa Montane RN (Registered Nurse) on  12/08/20 at 1055  Med List Status: <None>   Medication Order Taking? Sig Documenting Provider Last Dose Status Informant  ACCU-CHEK GUIDE test strip [id number] Yes  [provider] Taking Active Self  Accu-Chek Softclix Lancets lancets [id number] Yes 1 each 3 (three) times daily. [provider] Taking Active Self  acyclovir (ZOVIRAX) 800 MG tablet [id number] Yes Take by mouth. [provider] Taking Active    amLODipine (NORVASC) 10 MG tablet [id number] Yes Take 1 tablet (10 mg total) by mouth daily. [name], MD Taking Active   aspirin 325 MG tablet [id number] Yes Take 325 mg by mouth daily. [provider] Taking Active   Blood Glucose Monitoring Suppl (ACCU-CHEK GUIDE) w/Device KIT [id number] Yes  [provider] Taking Active Self  calcium carbonate (OS-CAL) 1250 (500 Ca) MG chewable tablet [id number] Yes Chew 1 tablet by mouth daily. [provider] Taking Active   cholecalciferol (VITAMIN D3) 25 MCG (1000 UNIT) tablet [id number] Yes Take 2,000 Units by mouth daily. [provider] Taking Active   cholecalciferol (VITAMIN D3) 25 MCG (1000 UNIT) tablet [id number] Yes Take 1,000 Units by mouth daily. [provider]  Active   folic acid (FOLVITE) 1 MG tablet [id number] Yes Take 1 mg by mouth daily. [provider] Taking Active   LANTUS SOLOSTAR 100 UNIT/ML Solostar Pen [id number] Yes Inject 25 Units into the skin at bedtime.  Patient taking differently: Inject 28-30 Units into the skin at bedtime.   [name], MD Taking Active Self           Med Note (ROBB, MELANIE A   Tue Oct 06, 2020 10:42 AM) Taking 30 units at bedtime  lenalidomide (REVLIMID) 10 MG capsule [id number] Yes Take 1 capsule daily for 21 days, then take 7 days off [name], MD Taking Active   metoprolol tartrate (LOPRESSOR) 25 MG tablet [id number] Yes Take 1 tablet (25 mg total) by mouth daily. [name], MD Taking Active   Multiple Vitamin (MULTIVITAMIN) tablet [id number] Yes Take 1 tablet by mouth daily. [provider] Taking Active   ondansetron (ZOFRAN) 8 MG tablet [id number] No Take by mouth.  Patient not taking: No sig reported   [provider] Not Taking Active   prochlorperazine (COMPAZINE) 10 MG tablet [id number] No Take by mouth.  Patient not taking: No sig reported   [provider] Not Taking Active   Med List  Note [name], CPhT 03/07/13 1442): No preferred pharmacy.             Patient Active Problem List   Diagnosis Date Noted   T1DM (type 1 diabetes mellitus) ([place]) 06/11/2020   Multiple myeloma ([place]) 03/09/2020   Goals of care, counseling/discussion 03/09/2020   Pregnancy of unknown anatomic location 05/27/2019   Chronic hypertension 05/24/2019    Conditions to be addressed/monitored: HTN, Hypertriglyceridemia and DM  Care Plan : Medication Management  Updates made by Lane Hacker, [place] since 10/08/2020 12:00 AM     Problem: Health Promotion or Disease Self-Management (General Plan of Care)      Goal: Medication Management   Note:   Current Barriers:  Unable to achieve control of DM    Pharmacist Clinical Goal(s):  Over the next 30 days, patient will achieve control of DM as evidenced by current glucose levels  through collaboration with PharmD and provider.    Interventions: Inter-disciplinary care team collaboration (see longitudinal plan of care) Comprehensive medication review performed; medication list updated in electronic medical record  @  RXCPDIABETES@ @RXCPHYPERTENSION @ @RXCPHYPERLIPIDEMIA @  Patient Goals/Self-Care Activities Over the next 30 days, patient will:  - take medications as prescribed check glucose Daily, document, and provide at future appointments  Follow Up Plan: The care management team will reach out to the patient again over the next 30 days.      Task: Mutually Develop and Royce Macadamia Achievement of Patient Goals   Note:   Care Management Activities:    - verbalization of feelings encouraged    Notes:      Medication Assistance: None required. Patient affirms current coverage meets needs.   Follow up: Agree   Plan: The care management team will reach out to the patient again over the next 60 days.   [place] Constable, Pharm.D., Managed Medicaid Pharmacist - [phone]

## 2021-01-04 NOTE — Patient Instructions (Signed)
Visit Information  Diamond Robbins was given information about Medicaid Managed Care team care coordination services as a part of their [facility] Medicaid benefit. Diamond Robbins verbally consented to engagement with the [facility] Managed Care team.   If you are experiencing a medical emergency, please call 911 or report to your local emergency department or urgent care.   If you have a non-emergency medical problem during routine business hours, please contact your provider's office and ask to speak with a nurse.   For questions related to your [facility] health plan, please call: [phone] or go here:[email or web address]  If you would like to schedule transportation through your [facility] plan, please call the following number at least 2 days in advance of your appointment: [phone].  Call the [employer] at [phone], at any time, 24 hours a day, 7 days a week. If you are in danger or need immediate medical attention call 911.  If you would like help to quit smoking, call 1-800-QUIT-NOW 325-201-7367) OR Espaol: 1-855-Djelo-Ya (1-601-093-2355) o para Diamond informacin haga clic aqu or Text READY to 200-400 to register via text  Diamond. Chum[place] - following are the goals we discussed in your visit today:   Goals Addressed   None     Please see education materials related to DM provided as print materials.   The patient verbalized understanding of instructions provided today and agreed to receive a mailed copy of patient instruction and/or educational materials.  The  Patient                                              has been provided with contact information for the Managed Medicaid care management team and has been advised to call with any health related questions or concerns.   [place] Constable, Pharm.D., Managed Medicaid Pharmacist [phone]   Following is a copy of your plan of care:  Patient Care Plan: Diabetes Type 1 (Adult)      Problem Identified: Glycemic Management (Diabetes, Type 1)      Long-Range Goal: Glycemic Management Optimized Completed 10/06/2020  Start Date: 06/17/2020  Expected End Date: 10/06/2020  This Visit's Progress: On track  Recent Progress: On track  Priority: High  Note:   CARE PLAN ENTRY Medicaid Managed Care (see longtitudinal plan of care for additional care plan information)  Objective:  Lab Results  Component Value Date   HGBA1C 8.8 (H) 03/11/2020   Lab Results  Component Value Date   CREATININE 0.47 08/21/2020   CREATININE 0.46 06/22/2020   CREATININE 0.53 06/09/2020  Patient reported cbg findings: postprandial readings typically 90-140  Current Barriers:  Knowledge Deficits related to basic Diabetes pathophysiology and self care/management-Diamond Robbins has a recent diagnosis of Type 1 Diabetes Mellitis and Multiple Myeloma. Treatment for MM is 2 weeks on and 1 week off. During her treatment weeks her BS readings are higher than usual due to her receiving steroids. She reports that at the time of diagnosis she received very helpful diabetic education. She is managing DM with diet and insulin. She is scheduled for a bone marrow transplant at [facility]. Due to a +COVID test on 2/14, bone marrow transplant will be rescheduled. Diamond Robbins will have her wisdom teeth removed on 4/7 and transplant scheduled to begin 09/09/20. 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 She reports doing well. She understands  that she needs to wear her mask and gloves in public, avoid crowded places, and no visitors for 100 days per provider. Patient reports improved blood sugar readings since BMT. Patient aware of follow up appointments and denies any needs at this time. Case Manager Clinical Goal(s):  patient will demonstrate improved adherence to prescribed treatment plan for diabetes self care/management as evidenced by:  daily monitoring and recording of CBG-met adherence to ADA/ carb modified  diet-met exercise 3-5 days/week-met adherence to prescribed medication regimen-met Interventions:  Provided education to patient about basic DM diet Reviewed medications with patient and discussed importance of medication adherence Discussed plans with patient for ongoing care management follow up and provided patient with direct contact information for care management team Reviewed scheduled/upcoming provider appointments  Review of patient status, including review of consultants reports, relevant laboratory and other test results, and medications completed. Discussed follow up after bone marrow transplant, verified that Diamond Robbins had contact information for her Navigator at [facility] should any questions arise Patient Self Care Activities:  - call to cancel if needed - keep a calendar with prescription refill dates - keep a calendar with appointment dates  - schedule a follow up appointment with PCP for evaluation of diabetes and HTN - check blood sugar at prescribed times - check blood sugar if I feel it is too high or too low - enter blood sugar readings and medication or insulin into daily log - take the blood sugar log to all doctor visits - take the blood sugar meter to all doctor visits - adhere to a diabetic diet - schedule appointment with eye doctor   Please see past updates related to this goal by clicking on the "Past Updates" button in the selected goal     Patient Care Plan: Medication Management     Problem Identified: Health Promotion or Disease Self-Management (General Plan of Care)      Goal: Medication Management   Note:   Current Barriers:  Unable to achieve control of DM    Pharmacist Clinical Goal(s):  Over the next 30 days, patient will achieve control of DM as evidenced by current glucose levels  through collaboration with PharmD and provider.  Patient will find and schedule a visit with a PCP  Interventions: Inter-disciplinary care team  collaboration (see longitudinal plan of care) Comprehensive medication review performed; medication list updated in electronic medical record  _0 @ _1 @ _2 @  Patient Goals/Self-Care Activities Over the next 30 days, patient will:  - take medications as prescribed check glucose Daily, document, and provide at future appointments  Follow Up Plan: The care management team will reach out to the patient again over the next 30 days.      Task: Mutually Develop and Royce Macadamia Achievement of Patient Goals   Note:   Care Management Activities:    - verbalization of feelings encouraged    Notes:     Patient Care Plan: Hypertension (Adult)     Problem Identified: Hypertension (Hypertension)      Long-Range Goal: Hypertension Monitored   Start Date: 12/08/2020  Expected End Date: 02/08/2021  This Visit's Progress: On track  Priority: High  Note:   Objective:  Last practice recorded BP readings:  BP Readings from Last 3 Encounters:  11/25/20 (!) 144/104  08/27/20 121/83  08/21/20 130/83   Most recent eGFR/CrCl: No results found for: EGFR  No components found for: CRCL Current Barriers:  Knowledge Deficits related to understanding of medications prescribed for management of hypertension Unable  to independently establish new PCP Case Manager Clinical Goal(s):  patient will verbalize understanding of plan for hypertension management patient will attend all scheduled medical appointments and call for new PCP appointment patient will demonstrate improved adherence to prescribed treatment plan for hypertension as evidenced by taking all medications as prescribed, monitoring and recording blood pressure as directed, adhering to low sodium/DASH diet Interventions:  Evaluation of current treatment plan related to hypertension self management and patient's adherence to plan as established by provider. Provided education to patient re: stroke  prevention, s/s of heart attack and stroke, DASH diet, complications of uncontrolled blood pressure Reviewed medications with patient and discussed importance of compliance Discussed plans with patient for ongoing care management follow up and provided patient with direct contact information for care management team Advised patient, providing education and rationale, to monitor blood pressure daily and record, calling PCP for findings outside established parameters.  Reviewed scheduled/upcoming provider appointments including: calling for new patient appointment with PCP of choice Provided information for local PCP office [facility] [phone] Patient Goals/Self-Care Activities:  - call [facility] [phone] for a PCP new patient appointment - check blood pressure weekly - choose a place to take my blood pressure (home, clinic or office, retail store) - write blood pressure results in a log or diary - take all medications as directed  - adhere to [place] will follow up on 01/07/21 @ 10:30am

## 2021-01-07 ENCOUNTER — Other Ambulatory Visit: Payer: Self-pay

## 2021-01-07 ENCOUNTER — Other Ambulatory Visit: Payer: Self-pay | Admitting: *Deleted

## 2021-01-07 DIAGNOSIS — C9001 Multiple myeloma in remission: Secondary | ICD-10-CM | POA: Diagnosis not present

## 2021-01-07 DIAGNOSIS — Z9484 Stem cells transplant status: Secondary | ICD-10-CM | POA: Diagnosis not present

## 2021-01-07 DIAGNOSIS — Z79899 Other long term (current) drug therapy: Secondary | ICD-10-CM | POA: Diagnosis not present

## 2021-01-07 DIAGNOSIS — Z5111 Encounter for antineoplastic chemotherapy: Secondary | ICD-10-CM | POA: Diagnosis not present

## 2021-01-07 DIAGNOSIS — Z9221 Personal history of antineoplastic chemotherapy: Secondary | ICD-10-CM | POA: Diagnosis not present

## 2021-01-07 NOTE — Patient Outreach (Signed)
Medicaid Managed Care   Nurse Care Manager Note  01/07/2021 Name:  Diamond Robbins MRN:  [id number] DOB:  Aug 21, 1990  Diamond Robbins is an 30 y.o. year old female who is a primary patient of [name], MD.  The [facility] Managed Care Coordination team was consulted for assistance with:    HTN Post BMT  Diamond Robbins was given information about Medicaid Managed Care Coordination team services today. Diamond Robbins Patient agreed to services and verbal consent obtained.  Engaged with patient by telephone for follow up visit in response to provider referral for case management and/or care coordination services.   Assessments/Interventions:  Review of past medical history, allergies, medications, health status, including review of consultants reports, laboratory and other test data, was performed as part of comprehensive evaluation and provision of chronic care management services.  SDOH (Social Determinants of Health) assessments and interventions performed:   Care Plan  No Known Allergies  Medications Reviewed Today     Reviewed by Melissa Montane, RN (Registered Nurse) on 01/07/21 at [place] List Status: <None>   Medication Order Taking? Sig Documenting Provider Last Dose Status Informant  ACCU-CHEK GUIDE test strip [id number] Yes  [provider] Taking Active Self  Accu-Chek Softclix Lancets lancets [id number] Yes 1 each 3 (three) times daily. [provider] Taking Active Self  acyclovir (ZOVIRAX) 800 MG tablet [id number] Yes Take by mouth. [provider] Taking Active   amLODipine (NORVASC) 10 MG tablet [id number] Yes Take 1 tablet (10 mg total) by mouth daily. [name], MD Taking Active   aspirin 325 MG tablet [id number] Yes Take 325 mg by mouth daily. [provider] Taking Active   Blood Glucose Monitoring Suppl (ACCU-CHEK GUIDE) w/Device KIT [id number] Yes  [provider] Taking Active Self  calcium carbonate (OS-CAL) 1250 (500  Ca) MG chewable tablet [id number] Yes Chew 1 tablet by mouth daily. [provider] Taking Active   cholecalciferol (VITAMIN D3) 25 MCG (1000 UNIT) tablet [id number]  Take 2,000 Units by mouth daily. [provider]  Consider Medication Status and Discontinue (Patient Preference)   cholecalciferol (VITAMIN D3) 25 MCG (1000 UNIT) tablet [id number]  Take 1,000 Units by mouth daily. [provider]  Consider Medication Status and Discontinue (Patient Preference)   folic acid (FOLVITE) 1 MG tablet [id number] Yes Take 1 mg by mouth daily. [provider] Taking Active   lenalidomide (REVLIMID) 10 MG capsule [id number] Yes Take 1 capsule daily for 21 days, then take 7 days off [name], MD Taking Active   metoprolol tartrate (LOPRESSOR) 25 MG tablet [id number] Yes Take 1 tablet (25 mg total) by mouth daily. [name], MD Taking Active   Multiple Vitamin (MULTIVITAMIN) tablet [id number] Yes Take 1 tablet by mouth daily. [provider] Taking Active   ondansetron (ZOFRAN) 8 MG tablet [id number] No Take by mouth.  Patient not taking: No sig reported   [provider] Not Taking Active   prochlorperazine (COMPAZINE) 10 MG tablet [id number] No Take by mouth.  Patient not taking: No sig reported   [provider] Not Taking Active   VITAMIN D PO [id number] Yes Take by mouth. [provider] Taking Active   Med List Note [name], CPhT 03/07/13 1442): No preferred pharmacy.             Patient Active Problem List   Diagnosis Date Noted   T1DM (type 1 diabetes mellitus) ([place]) 06/11/2020   Multiple myeloma ([place]) 03/09/2020  Goals of care, counseling/discussion 03/09/2020   Pregnancy of unknown anatomic location 05/27/2019   Chronic hypertension 05/24/2019    Conditions to be addressed/monitored per PCP order:  HTN and Post BMT  Care Plan : Diabetes Type 1 (Adult)  Updates made by Melissa Montane, RN  since 01/07/2021 12:00 AM  Completed 01/07/2021   Problem: Glycemic Management (Diabetes, Type 1) Resolved 12/08/2020     Care Plan : Hypertension (Adult)  Updates made by Melissa Montane, RN since 01/07/2021 12:00 AM     Problem: Hypertension (Hypertension)      Long-Range Goal: Hypertension Monitored   Start Date: 12/08/2020  Expected End Date: 03/08/2021  Recent Progress: On track  Priority: High  Note:   Objective:  Last practice recorded BP readings:  BP Readings from Last 3 Encounters:  12/28/20 117/81  12/28/20 (!) 133/95  11/25/20 (!) 144/104   Most recent eGFR/CrCl: No results found for: EGFR  No components found for: CRCL Current Barriers:  Knowledge Deficits related to understanding of medications prescribed for management of hypertension Unable to independently establish new PCP Case Manager Clinical Goal(s):  patient will verbalize understanding of plan for hypertension management patient will attend all scheduled medical appointments and call for new PCP appointment patient will demonstrate improved adherence to prescribed treatment plan for hypertension as evidenced by taking all medications as prescribed, monitoring and recording blood pressure as directed, adhering to low sodium/DASH diet Interventions:  Evaluation of current treatment plan related to hypertension self management and patient's adherence to plan as established by provider. Provided education to patient re: stroke prevention, s/s of heart attack and stroke, DASH diet, complications of uncontrolled blood pressure Reviewed medications with patient and discussed importance of compliance Discussed plans with patient for ongoing care management follow up and provided patient with direct contact information for care management team Advised patient, providing education and rationale, to monitor blood pressure daily and record, calling PCP for findings outside established parameters.  Reviewed  scheduled/upcoming provider appointments including: 8/25 for follow up BMT and 9/1 with new PCP Provided education to patient re: preventive care Patient Goals/Self-Care Activities:  - attend new patient appointment with PCP on 9/1-take all medications with you to your appointment - request a BP cuff at your PCP appointment  - check blood pressure weekly - choose a place to take my blood pressure (home, clinic or office, retail store) - write blood pressure results in a log or diary - take all medications as directed  - adhere to [place] will follow up on 03/08/21 @ 10:30am      Follow Up:  Patient agrees to Care Plan and Follow-up.  Plan: The Managed Medicaid care management team will reach out to the patient again over the next 60 days.  Date/time of next scheduled RN care management/care coordination outreach:  03/08/21 @ 10:30am  Lurena Joiner RN, BSN [place] RN Care Coordinator

## 2021-01-07 NOTE — Patient Instructions (Signed)
Visit Information  Diamond Robbins was given information about Medicaid Managed Care team care coordination services as a part of their [facility] Medicaid benefit. Diamond Robbins verbally consented to engagement with the [facility] Managed Care team.   If you are experiencing a medical emergency, please call 911 or report to your local emergency department or urgent care.   If you have a non-emergency medical problem during routine business hours, please contact your provider's office and ask to speak with a nurse.   For questions related to your [facility] health plan, please call: [phone] or go here:[email or web address]  If you would like to schedule transportation through your [facility] plan, please call the following number at least 2 days in advance of your appointment: [phone].  Call the [place] at [phone], at any time, 24 hours a day, 7 days a week. If you are in danger or need immediate medical attention call 911.  If you would like help to quit smoking, call 1-800-QUIT-NOW 641-032-8492) OR Espaol: 1-855-Djelo-Ya (7-902-409-7353) o para Diamond informacin haga clic aqu or Text READY to 200-400 to register via text  Diamond Robbins - following are the goals we discussed in your visit today:   Goals Addressed             This Visit's Progress    Track and Manage My Blood Pressure-Hypertension       Timeframe:  Long-Range Goal Priority:  High Start Date:   12/08/20                          Expected End Date: 03/08/21                      Follow Up Date 03/08/21    - attend new patient appointment with PCP on 9/1-take all medications with you to your appointment - request a BP cuff at your PCP appointment  - check blood pressure weekly - choose a place to take my blood pressure (home, clinic or office, retail store) - write blood pressure results in a log or diary - take all medications as directed  - adhere to dash diet    Why is this important?   You won't feel high blood pressure, but it can still hurt your blood vessels.  High blood pressure can cause heart or kidney problems. It can also cause a stroke.  Making lifestyle changes like losing a little weight or eating less salt will help.  Checking your blood pressure at home and at different times of the day can help to control blood pressure.  If the doctor prescribes medicine remember to take it the way the doctor ordered.  Call the office if you cannot afford the medicine or if there are questions about it.     Notes:         Please see education materials related to HTN and preventive care provided by MyChart link.  Patient has MyChart and access to the portal for receiving education provided today.  Telephone follow up appointment with Managed Medicaid care management team member scheduled for:03/08/21 @ 10:30am  Lurena Joiner RN, BSN [place] RN Care Coordinator   Following is a copy of your plan of care:  Patient Care Plan: Diabetes Type 1 (Adult)  Completed 01/07/2021   Problem Identified: Glycemic Management (Diabetes, Type 1) Resolved 12/08/2020     Long-Range Goal: Glycemic Management Optimized Completed 10/06/2020  Start  Date: 06/17/2020  Expected End Date: 10/06/2020  This Visit's Progress: On track  Recent Progress: On track  Priority: High  Note:   CARE PLAN ENTRY Medicaid Managed Care (see longtitudinal plan of care for additional care plan information)  Objective:  Lab Results  Component Value Date   HGBA1C 8.8 (H) 03/11/2020   Lab Results  Component Value Date   CREATININE 0.47 08/21/2020   CREATININE 0.46 06/22/2020   CREATININE 0.53 06/09/2020  Patient reported cbg findings: postprandial readings typically 90-140  Current Barriers:  Knowledge Deficits related to basic Diabetes pathophysiology and self care/management-Diamond Robbins has a recent diagnosis of Type 1 Diabetes Mellitis and  Multiple Myeloma. Treatment for MM is 2 weeks on and 1 week off. During her treatment weeks her BS readings are higher than usual due to her receiving steroids. She reports that at the time of diagnosis she received very helpful diabetic education. She is managing DM with diet and insulin. She is scheduled for a bone marrow transplant at [facility]. Due to a +COVID test on 2/14, bone marrow transplant will be rescheduled. Diamond Robbins will have her wisdom teeth removed on 4/7 and transplant scheduled to begin 09/09/20. 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 She reports doing well. She understands that she needs to wear her mask and gloves in public, avoid crowded places, and no visitors for 100 days per provider. Patient reports improved blood sugar readings since BMT. Patient aware of follow up appointments and denies any needs at this time. Case Manager Clinical Goal(s):  patient will demonstrate improved adherence to prescribed treatment plan for diabetes self care/management as evidenced by:  daily monitoring and recording of CBG-met adherence to ADA/ carb modified diet-met exercise 3-5 days/week-met adherence to prescribed medication regimen-met Interventions:  Provided education to patient about basic DM diet Reviewed medications with patient and discussed importance of medication adherence Discussed plans with patient for ongoing care management follow up and provided patient with direct contact information for care management team Reviewed scheduled/upcoming provider appointments  Review of patient status, including review of consultants reports, relevant laboratory and other test results, and medications completed. Discussed follow up after bone marrow transplant, verified that Diamond Robbins had contact information for her Navigator at [facility] should any questions arise Patient Self Care Activities:  - call to cancel if needed - keep a calendar with prescription refill dates - keep a  calendar with appointment dates  - schedule a follow up appointment with PCP for evaluation of diabetes and HTN - check blood sugar at prescribed times - check blood sugar if I feel it is too high or too low - enter blood sugar readings and medication or insulin into daily log - take the blood sugar log to all doctor visits - take the blood sugar meter to all doctor visits - adhere to a diabetic diet - schedule appointment with eye doctor   Please see past updates related to this goal by clicking on the "Past Updates" button in the selected goal     Patient Care Plan: Medication Management     Problem Identified: Health Promotion or Disease Self-Management (General Plan of Care)      Goal: Medication Management   Note:   Current Barriers:  Unable to achieve control of DM    Pharmacist Clinical Goal(s):  Over the next 30 days, patient will achieve control of DM as evidenced by current glucose levels  through collaboration with PharmD and provider.  Patient will  find and schedule a visit with a PCP  Interventions: Inter-disciplinary care team collaboration (see longitudinal plan of care) Comprehensive medication review performed; medication list updated in electronic medical record   Patient Goals/Self-Care Activities Over the next 30 days, patient will:  - take medications as prescribed check glucose Daily, document, and provide at future appointments  Follow Up Plan: The care management team will reach out to the patient again over the next 30 days.       Patient Care Plan: Hypertension (Adult)     Problem Identified: Hypertension (Hypertension)      Long-Range Goal: Hypertension Monitored   Start Date: 12/08/2020  Expected End Date: 03/08/2021  Recent Progress: On track  Priority: High  Note:   Objective:  Last practice recorded BP readings:  BP Readings from Last 3 Encounters:  12/28/20 117/81  12/28/20 (!) 133/95  11/25/20 (!) 144/104   Most recent  eGFR/CrCl: No results found for: EGFR  No components found for: CRCL Current Barriers:  Knowledge Deficits related to understanding of medications prescribed for management of hypertension Unable to independently establish new PCP Case Manager Clinical Goal(s):  patient will verbalize understanding of plan for hypertension management patient will attend all scheduled medical appointments and call for new PCP appointment patient will demonstrate improved adherence to prescribed treatment plan for hypertension as evidenced by taking all medications as prescribed, monitoring and recording blood pressure as directed, adhering to low sodium/DASH diet Interventions:  Evaluation of current treatment plan related to hypertension self management and patient's adherence to plan as established by provider. Provided education to patient re: stroke prevention, s/s of heart attack and stroke, DASH diet, complications of uncontrolled blood pressure Reviewed medications with patient and discussed importance of compliance Discussed plans with patient for ongoing care management follow up and provided patient with direct contact information for care management team Advised patient, providing education and rationale, to monitor blood pressure daily and record, calling PCP for findings outside established parameters.  Reviewed scheduled/upcoming provider appointments including: 8/25 for follow up BMT and 9/1 with new PCP Provided education to patient re: preventive care Patient Goals/Self-Care Activities:  - attend new patient appointment with PCP on 9/1-take all medications with you to your appointment - request a BP cuff at your PCP appointment  - check blood pressure weekly - choose a place to take my blood pressure (home, clinic or office, retail store) - write blood pressure results in a log or diary - take all medications as directed  - adhere to [place] will follow up on 03/08/21 @ 10:30am

## 2021-01-14 ENCOUNTER — Inpatient Hospital Stay (HOSPITAL_COMMUNITY): Payer: Medicaid Other | Attending: Hematology

## 2021-01-14 ENCOUNTER — Other Ambulatory Visit: Payer: Self-pay

## 2021-01-14 ENCOUNTER — Encounter: Payer: Self-pay | Admitting: Nurse Practitioner

## 2021-01-14 ENCOUNTER — Other Ambulatory Visit (HOSPITAL_COMMUNITY): Payer: Self-pay

## 2021-01-14 ENCOUNTER — Ambulatory Visit (INDEPENDENT_AMBULATORY_CARE_PROVIDER_SITE_OTHER): Payer: Medicaid Other | Admitting: Nurse Practitioner

## 2021-01-14 VITALS — BP 130/88 | HR 76 | Temp 98.7°F | Ht 62.0 in | Wt 166.0 lb

## 2021-01-14 DIAGNOSIS — Z7689 Persons encountering health services in other specified circumstances: Secondary | ICD-10-CM | POA: Diagnosis not present

## 2021-01-14 DIAGNOSIS — E109 Type 1 diabetes mellitus without complications: Secondary | ICD-10-CM | POA: Insufficient documentation

## 2021-01-14 DIAGNOSIS — Z79899 Other long term (current) drug therapy: Secondary | ICD-10-CM | POA: Diagnosis not present

## 2021-01-14 DIAGNOSIS — C9000 Multiple myeloma not having achieved remission: Secondary | ICD-10-CM | POA: Diagnosis not present

## 2021-01-14 DIAGNOSIS — Z560 Unemployment, unspecified: Secondary | ICD-10-CM | POA: Insufficient documentation

## 2021-01-14 DIAGNOSIS — I10 Essential (primary) hypertension: Secondary | ICD-10-CM

## 2021-01-14 DIAGNOSIS — Z794 Long term (current) use of insulin: Secondary | ICD-10-CM | POA: Insufficient documentation

## 2021-01-14 DIAGNOSIS — Z139 Encounter for screening, unspecified: Secondary | ICD-10-CM

## 2021-01-14 DIAGNOSIS — Z8249 Family history of ischemic heart disease and other diseases of the circulatory system: Secondary | ICD-10-CM | POA: Insufficient documentation

## 2021-01-14 DIAGNOSIS — Z833 Family history of diabetes mellitus: Secondary | ICD-10-CM | POA: Diagnosis not present

## 2021-01-14 DIAGNOSIS — Z419 Encounter for procedure for purposes other than remedying health state, unspecified: Secondary | ICD-10-CM | POA: Diagnosis not present

## 2021-01-14 DIAGNOSIS — R7402 Elevation of levels of lactic acid dehydrogenase (LDH): Secondary | ICD-10-CM | POA: Diagnosis not present

## 2021-01-14 LAB — PREGNANCY, URINE: Preg Test, Ur: NEGATIVE

## 2021-01-14 MED ORDER — LENALIDOMIDE 10 MG PO CAPS: ORAL_CAPSULE | ORAL | 0 refills | Status: DC

## 2021-01-14 NOTE — Assessment & Plan Note (Signed)
BP Readings from Last 3 Encounters:  01/14/21 130/88  12/28/20 117/81  12/28/20 (!) 133/95   -takes metoprolol and amlodipine

## 2021-01-14 NOTE — Assessment & Plan Note (Signed)
-  obtain records 

## 2021-01-14 NOTE — Patient Instructions (Signed)
Please have fasting labs drawn 2-3 days prior to your appointment so we can discuss the results during your office visit.  

## 2021-01-14 NOTE — Assessment & Plan Note (Addendum)
-  had bone marrow transplant at [facility] -is followed by [facility]; next appt 9/19 -reviewed records from [facility] and [facility]

## 2021-01-14 NOTE — Progress Notes (Signed)
New Patient Office Visit  Subjective:  Patient ID: Diamond Robbins, female    DOB: 1990/05/29  Age: 30 y.o. MRN: [id number]  CC:  Chief Complaint  Patient presents with   New Patient (Initial Visit)    Here to establish care. No complaints today.    HPI Diamond Robbins presents for new patient visit. Transferring care from [facility] in [place]. Last physical was over a year ago. Last labs were drawn 2 weeks ago at [facility].  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 Her multiple myeloma care is now handled by [facility].  She has T1DM.  She check blood sugar TID, and has been getting 100-120 usually. When she cheats on her diet, her CBG will be in the 180s.   Past Medical History:  Diagnosis Date   Cancer ([place]) 08/01/2020   multiple myeloma   DKA (diabetic ketoacidosis) ([place]) 02/23/2020   Hypertension    Type 1 diabetes mellitus ([place]) 02/23/2020    Past Surgical History:  Procedure Laterality Date   BONE MARROW TRANSPLANT     NO PAST SURGERIES      Family History  Problem Relation Age of Onset   Hypertension Mother    Diabetes Mother    Hypertension Father    Diabetes Maternal Grandmother     Social History   Socioeconomic History   Marital status: Married    Spouse name: Not on file   Number of children: Not on file   Years of education: Not on file   Highest education level: Not on file  Occupational History   Occupation: unemployed    Comment: can go back to work Jan 2023  Tobacco Use   Smoking status: Never   Smokeless tobacco: Never  Vaping Use   Vaping Use: Never used  Substance and Sexual Activity   Alcohol use: Not Currently    Comment: occ- 0-1 drinks per week; maybe 1 drink every 6 months   Drug use: No   Sexual activity: Yes    Birth control/protection: Condom  Other Topics Concern   Not on file  Social History Narrative   Has 2 step-daughters   Social  Determinants of Health   Financial Resource Strain: Low Risk    Difficulty of Paying Living Expenses: Not hard at all  Food Insecurity: No Food Insecurity   Worried About Charity fundraiser in the Last Year: Never true   Echo in the Last Year: Never true  Transportation Needs: No Transportation Needs   Lack of Transportation (Medical): No   Lack of Transportation (Non-Medical): No  Physical Activity: Insufficiently Active   Days of Exercise per Week: 3 days   Minutes of Exercise per Session: 10 min  Stress: No Stress Concern Present   Feeling of Stress : Not at all  Social Connections: Moderately Integrated   Frequency of Communication with Friends and Family: More than three times a week   Frequency of Social Gatherings with Friends and Family: More than three times a week   Attends Religious Services: More than 4 times per year   Active Member of [employer] or Organizations: No   Attends Archivist Meetings: Never   Marital Status: Married  Human resources officer Violence: Not At Risk   Fear of Current or Ex-Partner: No   Emotionally Abused: No   Physically Abused: No   Sexually Abused: No  ROS Review of Systems  Constitutional: Negative.   Respiratory: Negative.    Cardiovascular: Negative.   Endocrine: Negative.   Psychiatric/Behavioral: Negative.     Objective:   Today's Vitals: BP 130/88 (BP Location: Left Arm, Patient Position: Sitting, Cuff Size: Large)   Pulse 76   Temp 98.7 F (37.1 C) (Oral)   Ht _0  (1.575 m)   Wt 166 lb (75.3 kg)   SpO2 97%   BMI 30.36 kg/m   Physical Exam Constitutional:      Appearance: Normal appearance.  Cardiovascular:     Rate and Rhythm: Normal rate and regular rhythm.     Pulses: Normal pulses.     Heart sounds: Normal heart sounds.  Pulmonary:     Effort: Pulmonary effort is normal.     Breath sounds: Normal breath sounds.  Musculoskeletal:        General: Normal range of motion.  Neurological:      Mental Status: She is alert.  Psychiatric:        Mood and Affect: Mood normal.        Behavior: Behavior normal.        Thought Content: Thought content normal.        Judgment: Judgment normal.    Assessment & Plan:   Problem List Items Addressed This Visit       Cardiovascular and Mediastinum   Chronic hypertension    BP Readings from Last 3 Encounters:  01/14/21 130/88  12/28/20 117/81  12/28/20 (!) 133/95  -takes metoprolol and amlodipine      Relevant Orders   CBC with Differential/Platelet   CMP14+EGFR   Lipid Panel With LDL/HDL Ratio     Endocrine   T1DM (type 1 diabetes mellitus) ([place])    -had DM educator while hospitalized -she states she only uses Lantus, and blood sugar has been 100-120 generally -checks BS TID AC -check A1c with next labs -no current statin; no ACEi      Relevant Medications   insulin glargine (LANTUS) 100 UNIT/ML injection   Other Relevant Orders   Lipid Panel With LDL/HDL Ratio   Hemoglobin A1c   Microalbumin / creatinine urine ratio     Other   Multiple myeloma ([place])    -had bone marrow transplant at [facility] -is followed by [place]; next appt 9/19 -reviewed records [facility] and [facility]      Encounter to establish care - Primary    -obtain records      Relevant Orders   CBC with Differential/Platelet   CMP14+EGFR   Lipid Panel With LDL/HDL Ratio   Hemoglobin A1c   Hepatitis C antibody   HIV Antibody (routine testing w rflx)   Microalbumin / creatinine urine ratio   TSH   Other Visit Diagnoses     Screening due       Relevant Orders   Hepatitis C antibody   HIV Antibody (routine testing w rflx)       Outpatient Encounter Medications as of 01/14/2021  Medication Sig   ACCU-CHEK GUIDE test strip    Accu-Chek Softclix Lancets lancets 1 each 3 (three) times daily.   acyclovir (ZOVIRAX) 800 MG tablet Take by mouth.   amLODipine (NORVASC) 10 MG tablet Take 1 tablet (10 mg total) by mouth daily.   aspirin  325 MG tablet Take 325 mg by mouth daily.   Blood Glucose Monitoring Suppl (ACCU-CHEK GUIDE) w/Device KIT    calcium carbonate (OS-CAL) 1250 (500 Ca) MG chewable  tablet Chew 1 tablet by mouth daily.   cholecalciferol (VITAMIN D3) 25 MCG (1000 UNIT) tablet Take 1,000 Units by mouth daily.   folic acid (FOLVITE) 1 MG tablet Take 1 mg by mouth daily.   insulin glargine (LANTUS) 100 UNIT/ML injection Inject 30 Units into the skin at bedtime.   lenalidomide (REVLIMID) 10 MG capsule Take 1 capsule daily for 21 days, then take 7 days off   metoprolol tartrate (LOPRESSOR) 25 MG tablet Take 1 tablet (25 mg total) by mouth daily.   Multiple Vitamin (MULTIVITAMIN) tablet Take 1 tablet by mouth daily.   ondansetron (ZOFRAN) 8 MG tablet Take by mouth.   VITAMIN D PO Take by mouth.   [DISCONTINUED] cholecalciferol (VITAMIN D3) 25 MCG (1000 UNIT) tablet Take 2,000 Units by mouth daily. (Patient not taking: Reported on 01/14/2021)   [DISCONTINUED] prochlorperazine (COMPAZINE) 10 MG tablet Take by mouth. (Patient not taking: Reported on 01/14/2021)   No facility-administered encounter medications on file as of 01/14/2021.    Follow-up: Return in about 6 weeks (around 02/25/2021) for Physical Exam with PAP.   [name], NP

## 2021-01-14 NOTE — Telephone Encounter (Signed)
Chart reviewed. Revlimid refilled per Dr. Tomie China last office visit note.

## 2021-01-14 NOTE — Assessment & Plan Note (Addendum)
-  had DM educator while hospitalized -she states she only uses Lantus, and blood sugar has been 100-120 generally -checks BS TID AC -check A1c with next labs -no current statin; no ACEi

## 2021-01-22 ENCOUNTER — Encounter (HOSPITAL_COMMUNITY): Payer: Self-pay

## 2021-01-22 NOTE — Progress Notes (Signed)
[employer] authorization number from 01/14/21 expired on 01/21/21 without Revlimid being filled. I reached out to the pharmacy, [facility], to clarify. Anderson Malta, Pharmacist explained that patient started this cycle on 8/26 and will complete medication days on 9/16 and should therefore restart her next cycle of medication days on 9/23. I called and confirmed this with the patient also, she endorses that this is correct. Patient scheduled for follow-up on 02/01/21. Urine pregnancy test ordered for that day per protocol. Dr. Delton Coombes made aware.

## 2021-01-31 NOTE — Progress Notes (Signed)
[place], [place] [address]   CLINIC:  Medical Oncology/Hematology  PCP:  [name], NP [address]  [address] / [place] [address] [phone]   REASON FOR VISIT:  Follow-up for IgA kappa multiple myeloma  PRIOR THERAPY: none  NGS Results: not done  CURRENT THERAPY: RVD and Darzalex Faspro on D1, 4, 8, 11, and 15; Revlimid 2 weeks on, 1 week off; Zometa every 4 weeks  BRIEF ONCOLOGIC HISTORY:  Oncology History  Multiple myeloma (Diamond Robbins)  03/09/2020 Initial Diagnosis   Multiple myeloma (Diamond Robbins)   03/31/2020 - 06/12/2020 Chemotherapy            CANCER STAGING: Cancer Staging No matching staging information was found for the patient.  INTERVAL HISTORY:  [name], a 30 y.o. female, returns for routine follow-up of her  IgA kappa multiple myeloma. Doniqua was last seen on 12/28/2020.   Today she reports feeling good. She denies any recent infections, n/v/d/c, loss of appetite, numbness/tingling, and new pains  REVIEW OF SYSTEMS:  Review of Systems  Constitutional:  Negative for appetite change and fatigue.  Gastrointestinal:  Negative for constipation, diarrhea, nausea and vomiting.  Neurological:  Negative for numbness.   PAST MEDICAL/SURGICAL HISTORY:  Past Medical History:  Diagnosis Date   Cancer (Diamond Robbins) 08/01/2020   multiple myeloma   DKA (diabetic ketoacidosis) (Diamond Robbins) 02/23/2020   Hypertension    Type 1 diabetes mellitus (Diamond Robbins) 02/23/2020   Past Surgical History:  Procedure Laterality Date   BONE MARROW TRANSPLANT     NO PAST SURGERIES      SOCIAL HISTORY:  Social History   Socioeconomic History   Marital status: Married    Spouse name: Not on file   Number of children: Not on file   Years of education: Not on file   Highest education level: Not on file  Occupational History   Occupation: unemployed    Comment: can go back to work Jan 2023  Tobacco Use   Smoking status: Never    Smokeless tobacco: Never  Vaping Use   Vaping Use: Never used  Substance and Sexual Activity   Alcohol use: Not Currently    Comment: occ- 0-1 drinks per week; maybe 1 drink every 6 months   Drug use: No   Sexual activity: Yes    Birth control/protection: Condom  Other Topics Concern   Not on file  Social History Narrative   Has 2 step-daughters   Social Determinants of Health   Financial Resource Strain: Low Risk    Difficulty of Paying Living Expenses: Not hard at all  Food Insecurity: No Food Insecurity   Worried About Charity fundraiser in the Last Year: Never true   [place] in the Last Year: Never true  Transportation Needs: No Transportation Needs   Lack of Transportation (Medical): No   Lack of Transportation (Non-Medical): No  Physical Activity: Insufficiently Active   Days of Exercise per Week: 3 days   Minutes of Exercise per Session: 10 min  Stress: No Stress Concern Present   Feeling of Stress : Not at all  Social Connections: Moderately Integrated   Frequency of Communication with Friends and Family: More than three times a week   Frequency of Social Gatherings with Friends and Family: More than three times a week   Attends Religious Services: More than 4 times per year   Active Member of Clubs or Organizations: No  Attends Archivist Meetings: Never   Marital Status: Married  Human resources officer Violence: Not At Risk   Fear of Current or Ex-Partner: No   Emotionally Abused: No   Physically Abused: No   Sexually Abused: No    FAMILY HISTORY:  Family History  Problem Relation Age of Onset   Hypertension Mother    Diabetes Mother    Hypertension Father    Diabetes Maternal Grandmother     CURRENT MEDICATIONS:  Current Outpatient Medications  Medication Sig Dispense Refill   ACCU-CHEK GUIDE test strip      Accu-Chek Softclix Lancets lancets 1 each 3 (three) times daily.     acyclovir (ZOVIRAX) 800 MG tablet Take by mouth.      amLODipine (NORVASC) 10 MG tablet Take 1 tablet (10 mg total) by mouth daily. 30 tablet 1   aspirin 325 MG tablet Take 325 mg by mouth daily.     Blood Glucose Monitoring Suppl (ACCU-CHEK GUIDE) w/Device KIT      calcium carbonate (OS-CAL) 1250 (500 Ca) MG chewable tablet Chew 1 tablet by mouth daily.     cholecalciferol (VITAMIN D3) 25 MCG (1000 UNIT) tablet Take 1,000 Units by mouth daily.     folic acid (FOLVITE) 1 MG tablet Take 1 mg by mouth daily.     insulin glargine (LANTUS) 100 UNIT/ML injection Inject 30 Units into the skin at bedtime.     lenalidomide (REVLIMID) 10 MG capsule Take 1 capsule daily for 21 days, then take 7 days off 21 capsule 0   metoprolol tartrate (LOPRESSOR) 25 MG tablet Take 1 tablet (25 mg total) by mouth daily. 30 tablet 1   Multiple Vitamin (MULTIVITAMIN) tablet Take 1 tablet by mouth daily.     ondansetron (ZOFRAN) 8 MG tablet Take by mouth.     VITAMIN D PO Take by mouth.     No current facility-administered medications for this visit.    ALLERGIES:  No Known Allergies  PHYSICAL EXAM:  Performance status (ECOG): 1 - Symptomatic but completely ambulatory  There were no vitals filed for this visit. Wt Readings from Last 3 Encounters:  01/14/21 166 lb (75.3 kg)  12/28/20 168 lb 3.2 oz (76.3 kg)  11/25/20 160 lb 9.6 oz (72.8 kg)   Physical Exam   LABORATORY DATA:  I have reviewed the labs as listed.  CBC Latest Ref Rng & Units 12/22/2020 11/27/2020 11/09/2020  WBC 4.0 - 10.5 K/uL 4.8 4.6 4.1  Hemoglobin 12.0 - 15.0 g/dL 14.2 13.5 12.4  Hematocrit 36.0 - 46.0 % 44.0 40.0 38.0  Platelets 150 - 400 K/uL 306 333 310   CMP Latest Ref Rng & Units 12/22/2020 11/27/2020 11/09/2020  Glucose 70 - 99 mg/dL 105(H) 125(H) 184(H)  BUN 6 - 20 mg/dL 11 19 16   Creatinine 0.44 - 1.00 mg/dL 0.57 0.51 0.63  Sodium 135 - 145 mmol/L 140 139 140  Potassium 3.5 - 5.1 mmol/L 3.6 4.0 3.4(L)  Chloride 98 - 111 mmol/L 103 104 105  CO2 22 - 32 mmol/L 25 25 25   Calcium 8.9 -  10.3 mg/dL 10.3 10.2 9.8  Total Protein 6.5 - 8.1 g/dL 8.6(H) 8.0 7.2  Total Bilirubin 0.3 - 1.2 mg/dL 0.6 0.3 0.4  Alkaline Phos 38 - 126 U/L 93 79 72  AST 15 - 41 U/L 25 18 18   ALT 0 - 44 U/L 39 18 17    DIAGNOSTIC IMAGING:  I have independently reviewed the scans and discussed with the patient. No  results found.   ASSESSMENT:  1.  Stage III IgA kappa multiple myeloma: -Presentation to [facility] in [place] with confusion and weakness. -Found to have diabetic ketoacidosis with newly diagnosed type 1 diabetes. -Also found to have acute renal failure and hypercalcemia. -Bone marrow biopsy on 02/25/2020 with flow cytometry with the population CD138 positive, CD56 positive IgA kappa monoclonal plasma cells 12%.  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 -Ultrasound abdomen on 02/23/2020 showed increased hepatic echogenicity with normal spleen. -Renal ultrasound on 02/24/2020 was normal. -CT chest on 02/22/2020 with moth eaten appearance of the bones. -Beta-2 microglobulin 2.1.  LDH 322.  SPEP and immunofixation were normal.  Kappa light chains are elevated at 36.8.  Lambda light chains 12.9 and ratio of 2.85. -24-hour urine immunofixation was negative.  M spike was negative. -PET scan on 03/23/2020 showed no FDG radiotracer activity, no soft tissue plasmacytoma.  Diffuse multiple small lytic lesions within the pelvis, spine and vertebral bodies consistent with myeloma. -Chromosome analysis was 53, XX.  Multiple myeloma FISH panel was normal. -She is considered high risk based on elevated LDH level. -4 cycles of Dara VRD from 03/31/2020 through 06/12/2020. 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 - BMBX on 12/15/2020 with variably normocellular 30 to 70% with trilineage hematopoiesis.  No increase in plasma cells.  Myeloma FISH panel was normal.  Chromosome  analysis was normal. - Maintenance Revlimid 10 mg 3 weeks on/1 week off started around 12/08/2020.   2.  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  Non-smoker. -No family history of myeloma or other malignancies.   PLAN:  1.  Stage III IgA kappa multiple myeloma: -Maintenance Revlimid 10 mg 3 weeks on/1 week off started around 12/08/2020. - Reviewed the MRD results from [facility] which was negative from recent bone marrow biopsy. - She denies any GI symptoms from Revlimid. - Reviewed labs from today which shows normal LFTs and creatinine.  CBC was completely normal.  Pregnancy test was negative.  Last myeloma labs from 12/22/2020 were negative. - She will start her next cycle of Revlimid on 02/05/2021. - RTC 6 weeks for follow-up with myeloma labs.   2.  Type I diabetes: -Continue Lantus 30 units at bedtime.   3.  Hypertension: -Continue Norvasc and metoprolol.  This is well controlled.   4.  Bone protection:  - She received her first Zometa injection on 12/28/2020. - She tolerated it very well.  Continue calcium supplements.  Calcium today is 9.4. - She will proceed with injection today.   Orders placed this encounter:  No orders of the defined types were placed in this encounter.    [name], MD [place] [phone]   I, Thana Ates, am acting as a scribe for Dr. [name].  I, [name] MD, have reviewed the above documentation for accuracy and completeness, and I agree with the above.

## 2021-02-01 ENCOUNTER — Other Ambulatory Visit: Payer: Self-pay

## 2021-02-01 ENCOUNTER — Inpatient Hospital Stay (HOSPITAL_COMMUNITY): Payer: Medicaid Other

## 2021-02-01 ENCOUNTER — Inpatient Hospital Stay (HOSPITAL_BASED_OUTPATIENT_CLINIC_OR_DEPARTMENT_OTHER): Payer: Medicaid Other | Admitting: Hematology

## 2021-02-01 VITALS — BP 129/90 | HR 97 | Temp 96.9°F | Resp 18 | Wt 170.4 lb

## 2021-02-01 DIAGNOSIS — Z79899 Other long term (current) drug therapy: Secondary | ICD-10-CM | POA: Diagnosis not present

## 2021-02-01 DIAGNOSIS — C9000 Multiple myeloma not having achieved remission: Secondary | ICD-10-CM

## 2021-02-01 DIAGNOSIS — Z833 Family history of diabetes mellitus: Secondary | ICD-10-CM | POA: Diagnosis not present

## 2021-02-01 DIAGNOSIS — Z8249 Family history of ischemic heart disease and other diseases of the circulatory system: Secondary | ICD-10-CM | POA: Diagnosis not present

## 2021-02-01 DIAGNOSIS — R7402 Elevation of levels of lactic acid dehydrogenase (LDH): Secondary | ICD-10-CM | POA: Diagnosis not present

## 2021-02-01 DIAGNOSIS — Z560 Unemployment, unspecified: Secondary | ICD-10-CM | POA: Diagnosis not present

## 2021-02-01 DIAGNOSIS — I10 Essential (primary) hypertension: Secondary | ICD-10-CM | POA: Diagnosis not present

## 2021-02-01 DIAGNOSIS — Z794 Long term (current) use of insulin: Secondary | ICD-10-CM | POA: Diagnosis not present

## 2021-02-01 DIAGNOSIS — E109 Type 1 diabetes mellitus without complications: Secondary | ICD-10-CM | POA: Diagnosis not present

## 2021-02-01 LAB — CBC WITH DIFFERENTIAL/PLATELET
Abs Immature Granulocytes: 0.03 10*3/uL (ref 0.00–0.07)
Basophils Absolute: 0.1 10*3/uL (ref 0.0–0.1)
Basophils Relative: 1 %
Eosinophils Absolute: 0.8 10*3/uL — ABNORMAL HIGH (ref 0.0–0.5)
Eosinophils Relative: 15 %
HCT: 38.9 % (ref 36.0–46.0)
Hemoglobin: 12.8 g/dL (ref 12.0–15.0)
Immature Granulocytes: 1 %
Lymphocytes Relative: 31 %
Lymphs Abs: 1.6 10*3/uL (ref 0.7–4.0)
MCH: 28.3 pg (ref 26.0–34.0)
MCHC: 32.9 g/dL (ref 30.0–36.0)
MCV: 86.1 fL (ref 80.0–100.0)
Monocytes Absolute: 0.6 10*3/uL (ref 0.1–1.0)
Monocytes Relative: 11 %
Neutro Abs: 2.1 10*3/uL (ref 1.7–7.7)
Neutrophils Relative %: 41 %
Platelets: 319 10*3/uL (ref 150–400)
RBC: 4.52 MIL/uL (ref 3.87–5.11)
RDW: 13.2 % (ref 11.5–15.5)
WBC: 5.1 10*3/uL (ref 4.0–10.5)
nRBC: 0 % (ref 0.0–0.2)

## 2021-02-01 LAB — COMPREHENSIVE METABOLIC PANEL
ALT: 38 U/L (ref 0–44)
AST: 24 U/L (ref 15–41)
Albumin: 4.5 g/dL (ref 3.5–5.0)
Alkaline Phosphatase: 84 U/L (ref 38–126)
Anion gap: 13 (ref 5–15)
BUN: 13 mg/dL (ref 6–20)
CO2: 21 mmol/L — ABNORMAL LOW (ref 22–32)
Calcium: 9.4 mg/dL (ref 8.9–10.3)
Chloride: 102 mmol/L (ref 98–111)
Creatinine, Ser: 0.74 mg/dL (ref 0.44–1.00)
GFR, Estimated: >60 mL/min (ref >60)
Glucose, Bld: 146 mg/dL — ABNORMAL HIGH (ref 70–99)
Potassium: 3.7 mmol/L (ref 3.5–5.1)
Sodium: 136 mmol/L (ref 135–145)
Total Bilirubin: 0.4 mg/dL (ref 0.3–1.2)
Total Protein: 7.7 g/dL (ref 6.5–8.1)

## 2021-02-01 LAB — PREGNANCY, URINE: Preg Test, Ur: NEGATIVE

## 2021-02-01 LAB — MAGNESIUM: Magnesium: 1.7 mg/dL (ref 1.7–2.4)

## 2021-02-01 MED ORDER — ZOLEDRONIC ACID 4 MG/100ML IV SOLN
4.0000 mg | Freq: Once | INTRAVENOUS | Status: AC
  Administered 2021-02-01: 4 mg via INTRAVENOUS
  Filled 2021-02-01: qty 100

## 2021-02-01 MED ORDER — SODIUM CHLORIDE 0.9 % IV SOLN: Freq: Once | INTRAVENOUS | Status: AC

## 2021-02-01 NOTE — Progress Notes (Signed)
Tolerated zometa today without incidence.  AVS reviewed.  Vital signs stable.  Stable during and after infusion.  Discharged in stable condition ambulatory.

## 2021-02-01 NOTE — Patient Instructions (Signed)
[facility]  Discharge Instructions: Thank you for choosing [facility] to provide your oncology and hematology care.  If you have a lab appointment with the [facility], please come in thru the Main Entrance and check in at the main information desk.  Wear comfortable clothing and clothing appropriate for easy access to any Portacath or PICC line.   We strive to give you quality time with your provider. You may need to reschedule your appointment if you arrive late (15 or more minutes).  Arriving late affects you and other patients whose appointments are after yours.  Also, if you miss three or more appointments without notifying the office, you may be dismissed from the clinic at the provider's discretion.      For prescription refill requests, have your pharmacy contact our office and allow 72 hours for refills to be completed.    Today you received the following chemotherapy and/or immunotherapy agents Zometa      To help prevent nausea and vomiting after your treatment, we encourage you to take your nausea medication as directed.  BELOW ARE SYMPTOMS THAT SHOULD BE REPORTED IMMEDIATELY: *FEVER GREATER THAN 100.4 F (38 C) OR HIGHER *CHILLS OR SWEATING *NAUSEA AND VOMITING THAT IS NOT CONTROLLED WITH YOUR NAUSEA MEDICATION *UNUSUAL SHORTNESS OF BREATH *UNUSUAL BRUISING OR BLEEDING *URINARY PROBLEMS (pain or burning when urinating, or frequent urination) *BOWEL PROBLEMS (unusual diarrhea, constipation, pain near the anus) TENDERNESS IN MOUTH AND THROAT WITH OR WITHOUT PRESENCE OF ULCERS (sore throat, sores in mouth, or a toothache) UNUSUAL RASH, SWELLING OR PAIN  UNUSUAL VAGINAL DISCHARGE OR ITCHING   Items with * indicate a potential emergency and should be followed up as soon as possible or go to the Emergency Department if any problems should occur.  Please show the CHEMOTHERAPY ALERT CARD or IMMUNOTHERAPY ALERT CARD at check-in to the Emergency  Department and triage nurse.  Should you have questions after your visit or need to cancel or reschedule your appointment, please contact [facility] [phone]  and follow the prompts.  Office hours are 8:00 a.m. to 4:30 p.m. Monday - Friday. Please note that voicemails left after 4:00 p.m. may not be returned until the following business day.  We are closed weekends and major holidays. You have access to a nurse at all times for urgent questions. Please call the main number to the clinic [phone] and follow the prompts.  For any non-urgent questions, you may also contact your provider using MyChart. We now offer e-Visits for anyone 30 and older to request care online for non-urgent symptoms. For details visit [email or web address].   Also download the MyChart app! Go to the app store, search "MyChart", open the app, select [facility], and log in with your MyChart username and password.  Due to Covid, a mask is required upon entering the hospital/clinic. If you do not have a mask, one will be given to you upon arrival. For doctor visits, patients may have 1 support person aged 30 or older with them. For treatment visits, patients cannot have anyone with them due to current Covid guidelines and our immunocompromised population.   Zoledronic Acid Injection (Hypercalcemia, Oncology) What is this medication? ZOLEDRONIC ACID (ZOE le dron ik AS id) slows calcium loss from bones. It high calcium levels in the blood from some kinds of cancer. It may be used in other people at risk for bone loss. This medicine may be used for other purposes; ask your health care  provider or pharmacist if you have questions. COMMON BRAND NAME(S): Zometa What should I tell my care team before I take this medication? They need to know if you have any of these conditions: cancer dehydration dental disease kidney disease liver disease low levels of calcium in the blood lung or breathing disease  (asthma) receiving steroids like dexamethasone or prednisone an unusual or allergic reaction to zoledronic acid, other medicines, foods, dyes, or preservatives pregnant or trying to get pregnant breast-feeding How should I use this medication? This drug is injected into a vein. It is given by a health care provider in a hospital or clinic setting. Talk to your health care provider about the use of this drug in children. Special care may be needed. Overdosage: If you think you have taken too much of this medicine contact a poison control center or emergency room at once. NOTE: This medicine is only for you. Do not share this medicine with others. What if I miss a dose? Keep appointments for follow-up doses. It is important not to miss your dose. Call your health care provider if you are unable to keep an appointment. What may interact with this medication? certain antibiotics given by injection NSAIDs, medicines for pain and inflammation, like ibuprofen or naproxen some diuretics like bumetanide, furosemide teriparatide thalidomide This list may not describe all possible interactions. Give your health care provider a list of all the medicines, herbs, non-prescription drugs, or dietary supplements you use. Also tell them if you smoke, drink alcohol, or use illegal drugs. Some items may interact with your medicine. What should I watch for while using this medication? Visit your health care provider for regular checks on your progress. It may be some time before you see the benefit from this drug. Some people who take this drug have severe bone, joint, or muscle pain. This drug may also increase your risk for jaw problems or a broken thigh bone. Tell your health care provider right away if you have severe pain in your jaw, bones, joints, or muscles. Tell you health care provider if you have any pain that does not go away or that gets worse. Tell your dentist and dental surgeon that you are taking  this drug. You should not have major dental surgery while on this drug. See your dentist to have a dental exam and fix any dental problems before starting this drug. Take good care of your teeth while on this drug. Make sure you see your dentist for regular follow-up appointments. You should make sure you get enough calcium and vitamin D while you are taking this drug. Discuss the foods you eat and the vitamins you take with your health care provider. Check with your health care provider if you have severe diarrhea, nausea, and vomiting, or if you sweat a lot. The loss of too much body fluid may make it dangerous for you to take this drug. You may need blood work done while you are taking this drug. Do not become pregnant while taking this drug. Women should inform their health care provider if they wish to become pregnant or think they might be pregnant. There is potential for serious harm to an unborn child. Talk to your health care provider for more information. What side effects may I notice from receiving this medication? Side effects that you should report to your doctor or health care provider as soon as possible: allergic reactions (skin rash, itching or hives; swelling of the face, lips, or tongue) bone pain  infection (fever, chills, cough, sore throat, pain or trouble passing urine) jaw pain, especially after dental work joint pain kidney injury (trouble passing urine or change in the amount of urine) low blood pressure (dizziness; feeling faint or lightheaded, falls; unusually weak or tired) low calcium levels (fast heartbeat; muscle cramps or pain; pain, tingling, or numbness in the hands or feet; seizures) low magnesium levels (fast, irregular heartbeat; muscle cramp or pain; muscle weakness; tremors; seizures) low red blood cell counts (trouble breathing; feeling faint; lightheaded, falls; unusually weak or tired) muscle pain redness, blistering, peeling, or loosening of the skin,  including inside the mouth severe diarrhea swelling of the ankles, feet, hands trouble breathing Side effects that usually do not require medical attention (report to your doctor or health care provider if they continue or are bothersome): anxious constipation coughing depressed mood eye irritation, itching, or pain fever general ill feeling or flu-like symptoms nausea pain, redness, or irritation at site where injected trouble sleeping This list may not describe all possible side effects. Call your doctor for medical advice about side effects. You may report side effects to FDA at 1-800-FDA-1088. Where should I keep my medication? This drug is given in a hospital or clinic. It will not be stored at home. NOTE: This sheet is a summary. It may not cover all possible information. If you have questions about this medicine, talk to your doctor, pharmacist, or health care provider.  2022 Elsevier/Gold Standard (2019-02-14 09:13:00)

## 2021-02-01 NOTE — Patient Instructions (Addendum)
[facility] at [facility] Discharge Instructions  You were seen today by Dr. Delton Coombes. He went over your recent results, and you received your injection. Dr. Delton Coombes will see you back in 6 weeks for labs and follow up.   Thank you for choosing [facility] at [facility] to provide your oncology and hematology care.  To afford each patient quality time with our provider, please arrive at least 15 minutes before your scheduled appointment time.   If you have a lab appointment with the [facility] please come in thru the Main Entrance and check in at the main information desk  You need to re-schedule your appointment should you arrive 10 or more minutes late.  We strive to give you quality time with our providers, and arriving late affects you and other patients whose appointments are after yours.  Also, if you no show three or more times for appointments you may be dismissed from the clinic at the providers discretion.     Again, thank you for choosing [facility].  Our hope is that these requests will decrease the amount of time that you wait before being seen by our physicians.       _____________________________________________________________  Should you have questions after your visit to [facility], please contact our office at [phone] between the hours of 8:00 a.m. and 4:30 p.m.  Voicemails left after 4:00 p.m. will not be returned until the following business day.  For prescription refill requests, have your pharmacy contact our office and allow 72 hours.    Cancer Center Support Programs:   > Cancer Support Group  2nd Tuesday of the month 1pm-2pm, Journey Room

## 2021-02-02 ENCOUNTER — Other Ambulatory Visit (HOSPITAL_COMMUNITY): Payer: Self-pay

## 2021-02-02 MED ORDER — LENALIDOMIDE 10 MG PO CAPS: ORAL_CAPSULE | ORAL | 0 refills | Status: DC

## 2021-02-02 NOTE — Telephone Encounter (Signed)
Chart reviewed. Revlimid refilled per last office note with Dr. Katragadda.  

## 2021-02-03 ENCOUNTER — Ambulatory Visit (HOSPITAL_COMMUNITY): Payer: Medicaid Other

## 2021-02-08 ENCOUNTER — Telehealth: Payer: Self-pay | Admitting: Nurse Practitioner

## 2021-02-08 ENCOUNTER — Other Ambulatory Visit: Payer: Self-pay

## 2021-02-08 MED ORDER — INSULIN GLARGINE 100 UNIT/ML ~~LOC~~ SOLN: 30.0000 [IU] | Freq: Every day | SUBCUTANEOUS | 2 refills | Status: DC

## 2021-02-08 NOTE — Telephone Encounter (Signed)
Please send in refill for insulin glargine (LANTUS) 100 UNIT/ML injection

## 2021-02-08 NOTE — Telephone Encounter (Signed)
Refill sent.

## 2021-02-09 ENCOUNTER — Telehealth: Payer: Self-pay

## 2021-02-09 ENCOUNTER — Other Ambulatory Visit: Payer: Self-pay

## 2021-02-09 ENCOUNTER — Encounter: Payer: Self-pay | Admitting: Nurse Practitioner

## 2021-02-09 MED ORDER — LANTUS SOLOSTAR 100 UNIT/ML ~~LOC~~ SOPN: 30.0000 [IU] | PEN_INJECTOR | Freq: Every evening | SUBCUTANEOUS | 0 refills | Status: DC

## 2021-02-09 MED ORDER — LANTUS SOLOSTAR 100 UNIT/ML ~~LOC~~ SOPN: 25.0000 [IU] | PEN_INJECTOR | Freq: Every evening | SUBCUTANEOUS | 0 refills | Status: DC

## 2021-02-09 MED ORDER — INSULIN GLARGINE 100 UNIT/ML ~~LOC~~ SOLN: 30.0000 [IU] | Freq: Every day | SUBCUTANEOUS | 2 refills | Status: DC

## 2021-02-09 NOTE — Telephone Encounter (Signed)
Please give patient or Randall Hiss a call when this is sent into the pharmacy

## 2021-02-09 NOTE — Telephone Encounter (Signed)
Refills sent

## 2021-02-09 NOTE — Telephone Encounter (Signed)
Called did not receive this refill at [facility], completely out of insulin.  insulin glargine (LANTUS) 100 UNIT/ML injection

## 2021-02-09 NOTE — Telephone Encounter (Signed)
Faxed to [facility]

## 2021-02-13 DIAGNOSIS — Z419 Encounter for procedure for purposes other than remedying health state, unspecified: Secondary | ICD-10-CM | POA: Diagnosis not present

## 2021-02-18 ENCOUNTER — Other Ambulatory Visit: Payer: Self-pay

## 2021-02-18 ENCOUNTER — Other Ambulatory Visit (HOSPITAL_COMMUNITY)
Admission: RE | Admit: 2021-02-18 | Discharge: 2021-02-18 | Disposition: A | Discharge status: Home / Self Care | Payer: Medicaid Other | Source: Ambulatory Visit | Attending: Nurse Practitioner | Admitting: Nurse Practitioner

## 2021-02-18 ENCOUNTER — Ambulatory Visit (INDEPENDENT_AMBULATORY_CARE_PROVIDER_SITE_OTHER): Payer: Medicaid Other | Admitting: Nurse Practitioner

## 2021-02-18 ENCOUNTER — Encounter: Payer: Self-pay | Admitting: Nurse Practitioner

## 2021-02-18 DIAGNOSIS — Z124 Encounter for screening for malignant neoplasm of cervix: Secondary | ICD-10-CM | POA: Insufficient documentation

## 2021-02-18 DIAGNOSIS — I10 Essential (primary) hypertension: Secondary | ICD-10-CM | POA: Diagnosis not present

## 2021-02-18 DIAGNOSIS — E109 Type 1 diabetes mellitus without complications: Secondary | ICD-10-CM

## 2021-02-18 DIAGNOSIS — C9000 Multiple myeloma not having achieved remission: Secondary | ICD-10-CM | POA: Diagnosis not present

## 2021-02-18 DIAGNOSIS — Z7689 Persons encountering health services in other specified circumstances: Secondary | ICD-10-CM | POA: Diagnosis not present

## 2021-02-18 DIAGNOSIS — Z139 Encounter for screening, unspecified: Secondary | ICD-10-CM | POA: Diagnosis not present

## 2021-02-18 NOTE — Assessment & Plan Note (Signed)
-  PAP smear performed today

## 2021-02-18 NOTE — Assessment & Plan Note (Signed)
-  had bone marrow transplant at [facility] -followed by Oncology at [facility]

## 2021-02-18 NOTE — Patient Instructions (Signed)
Please have fasting labs drawn today. 

## 2021-02-18 NOTE — Progress Notes (Addendum)
Established Patient Office Visit  Subjective:  Patient ID: Diamond Robbins, female    DOB: December 10, 1990  Age: 30 y.o. MRN: [id number]  CC:  Chief Complaint  Patient presents with   Annual Exam    Physical with pap    HPI Diamond Robbins presents for physical exam with PAP. She has labs ordered, but didn't have them drawn prior to her appointment today.  Past Medical History:  Diagnosis Date   Cancer ([place]) 08/01/2020   multiple myeloma   DKA (diabetic ketoacidosis) ([place]) 02/23/2020   Hypertension    Type 1 diabetes mellitus ([place]) 02/23/2020    Past Surgical History:  Procedure Laterality Date   BONE MARROW TRANSPLANT     NO PAST SURGERIES      Family History  Problem Relation Age of Onset   Hypertension Mother    Diabetes Mother    Hypertension Father    Diabetes Maternal Grandmother     Social History   Socioeconomic History   Marital status: Married    Spouse name: Not on file   Number of children: Not on file   Years of education: Not on file   Highest education level: Not on file  Occupational History   Occupation: unemployed    Comment: can go back to work Jan 2023  Tobacco Use   Smoking status: Never   Smokeless tobacco: Never  Vaping Use   Vaping Use: Never used  Substance and Sexual Activity   Alcohol use: Not Currently    Comment: occ- 0-1 drinks per week; maybe 1 drink every 6 months   Drug use: No   Sexual activity: Yes    Birth control/protection: Condom  Other Topics Concern   Not on file  Social History Narrative   Has 2 step-daughters   Social Determinants of Health   Financial Resource Strain: Low Risk    Difficulty of Paying Living Expenses: Not hard at all  Food Insecurity: No Food Insecurity   Worried About Charity fundraiser in the Last Year: Never true   [place] in the Last Year: Never true  Transportation Needs: No Transportation Needs   Lack of Transportation (Medical): No   Lack of Transportation (Non-Medical):  No  Physical Activity: Insufficiently Active   Days of Exercise per Week: 3 days   Minutes of Exercise per Session: 10 min  Stress: No Stress Concern Present   Feeling of Stress : Not at all  Social Connections: Moderately Integrated   Frequency of Communication with Friends and Family: More than three times a week   Frequency of Social Gatherings with Friends and Family: More than three times a week   Attends Religious Services: More than 4 times per year   Active Member of [employer] or Organizations: No   Attends Archivist Meetings: Never   Marital Status: Married  Human resources officer Violence: Not At Risk   Fear of Current or Ex-Partner: No   Emotionally Abused: No   Physically Abused: No   Sexually Abused: No    Outpatient Medications Prior to Visit  Medication Sig Dispense Refill   ACCU-CHEK GUIDE test strip      Accu-Chek Softclix Lancets lancets 1 each 3 (three) times daily.     acyclovir (ZOVIRAX) 800 MG tablet Take by mouth.     amLODipine (NORVASC) 10 MG tablet Take 1 tablet (10 mg total) by mouth daily. 30 tablet 1   aspirin 325 MG tablet Take 325 mg by mouth daily.  Blood Glucose Monitoring Suppl (ACCU-CHEK GUIDE) w/Device KIT      calcium carbonate (OS-CAL) 1250 (500 Ca) MG chewable tablet Chew 1 tablet by mouth daily.     cholecalciferol (VITAMIN D3) 25 MCG (1000 UNIT) tablet Take 1,000 Units by mouth daily.     folic acid (FOLVITE) 1 MG tablet Take 1 mg by mouth daily.     LANTUS SOLOSTAR 100 UNIT/ML Solostar Pen Inject 30 Units into the skin at bedtime. 15 mL 0   lenalidomide (REVLIMID) 10 MG capsule Take 1 capsule daily for 21 days, then take 7 days off 21 capsule 0   metoprolol tartrate (LOPRESSOR) 25 MG tablet Take 1 tablet (25 mg total) by mouth daily. 30 tablet 1   Multiple Vitamin (MULTIVITAMIN) tablet Take 1 tablet by mouth daily.     ondansetron (ZOFRAN) 8 MG tablet Take by mouth.     sulfamethoxazole-trimethoprim (BACTRIM DS) 800-160 MG tablet Take  1 tablet by mouth 3 (three) times a week.     VITAMIN D PO Take by mouth.     No facility-administered medications prior to visit.    No Known Allergies  ROS Review of Systems  Constitutional: Negative.   HENT: Negative.    Eyes: Negative.   Respiratory: Negative.    Cardiovascular: Negative.   Gastrointestinal: Negative.   Endocrine: Negative.   Genitourinary: Negative.   Musculoskeletal: Negative.   Skin: Negative.   Allergic/Immunologic: Negative.   Neurological: Negative.   Hematological: Negative.   Psychiatric/Behavioral: Negative.       Objective:    Physical Exam Exam conducted with a chaperone present Joellen Jersey, CMA).  Constitutional:      Appearance: Normal appearance.  HENT:     Head: Normocephalic and atraumatic.     Right Ear: Tympanic membrane, ear canal and external ear normal.     Left Ear: Tympanic membrane, ear canal and external ear normal.     Nose: Nose normal.     Mouth/Throat:     Mouth: Mucous membranes are moist.     Pharynx: Oropharynx is clear.  Eyes:     Extraocular Movements: Extraocular movements intact.     Conjunctiva/sclera: Conjunctivae normal.     Pupils: Pupils are equal, round, and reactive to light.  Cardiovascular:     Rate and Rhythm: Normal rate and regular rhythm.     Pulses: Normal pulses.          Dorsalis pedis pulses are 2+ on the right side and 2+ on the left side.     Heart sounds: Normal heart sounds.  Pulmonary:     Effort: Pulmonary effort is normal.     Breath sounds: Normal breath sounds.  Chest:  Breasts:    Right: Normal.     Left: Normal.  Abdominal:     General: Abdomen is flat. Bowel sounds are normal.     Palpations: Abdomen is soft.  Musculoskeletal:        General: Normal range of motion.     Cervical back: Normal range of motion and neck supple.  Feet:     Right foot:     Protective Sensation: 10 sites tested.  10 sites sensed.     Skin integrity: Skin integrity normal.     Toenail Condition:  Right toenails are normal.     Left foot:     Protective Sensation: 10 sites tested.  10 sites sensed.     Skin integrity: Skin integrity normal.     Toenail Condition: Left  toenails are normal.  Lymphadenopathy:     Upper Body:     Right upper body: No supraclavicular, axillary or pectoral adenopathy.     Left upper body: No supraclavicular, axillary or pectoral adenopathy.  Skin:    General: Skin is warm and dry.     Capillary Refill: Capillary refill takes less than 2 seconds.  Neurological:     General: No focal deficit present.     Mental Status: She is alert and oriented to person, place, and time.     Cranial Nerves: No cranial nerve deficit.     Sensory: No sensory deficit.     Motor: No weakness.     Coordination: Coordination normal.     Gait: Gait normal.  Psychiatric:        Mood and Affect: Mood normal.        Behavior: Behavior normal.        Thought Content: Thought content normal.        Judgment: Judgment normal.    BP (!) 134/94 (BP Location: Right Arm, Patient Position: Sitting, Cuff Size: Large)   Pulse 99   Temp 98.6 F (37 C) (Oral)   Ht 5' 2"  (1.575 m)   Wt 173 lb (78.5 kg)   SpO2 97%   BMI 31.64 kg/m  Wt Readings from Last 3 Encounters:  02/18/21 173 lb (78.5 kg)  02/01/21 170 lb 6.7 oz (77.3 kg)  01/14/21 166 lb (75.3 kg)     Health Maintenance Due  Topic Date Due   OPHTHALMOLOGY EXAM  Never done   URINE MICROALBUMIN  Never done   HIV Screening  Never done   Hepatitis C Screening  Never done   TETANUS/TDAP  Never done   HEMOGLOBIN A1C  09/09/2020    There are no preventive care reminders to display for this patient.  No results found for: TSH Lab Results  Component Value Date   WBC 5.1 02/01/2021   HGB 12.8 02/01/2021   HCT 38.9 02/01/2021   MCV 86.1 02/01/2021   PLT 319 02/01/2021   Lab Results  Component Value Date   NA 136 02/01/2021   K 3.7 02/01/2021   CO2 21 (L) 02/01/2021   GLUCOSE 146 (H) 02/01/2021   BUN 13  02/01/2021   CREATININE 0.74 02/01/2021   BILITOT 0.4 02/01/2021   ALKPHOS 84 02/01/2021   AST 24 02/01/2021   ALT 38 02/01/2021   PROT 7.7 02/01/2021   ALBUMIN 4.5 02/01/2021   CALCIUM 9.4 02/01/2021   ANIONGAP 13 02/01/2021   No results found for: CHOL No results found for: HDL No results found for: LDLCALC No results found for: TRIG No results found for: CHOLHDL Lab Results  Component Value Date   HGBA1C 8.8 (H) 03/11/2020      Assessment & Plan:   Problem List Items Addressed This Visit       Cardiovascular and Mediastinum   Chronic hypertension    BP Readings from Last 3 Encounters:  02/18/21 (!) 134/94  02/01/21 129/90  01/14/21 130/88  -BP slightly elevated today, but has been good previously        Endocrine   T1DM (type 1 diabetes mellitus) ([place])    -check A1c with labs        Other   Multiple myeloma ([place])    -had bone marrow transplant at [facility] -followed by Oncology at [place]      Pap smear for cervical cancer screening    -PAP smear  performed today      Relevant Orders   Cytology - PAP    No orders of the defined types were placed in this encounter.   Follow-up: Return in about 4 months (around 06/21/2021) for Lab follow-up (DM)[same-day fasting labs].    [name], NP

## 2021-02-18 NOTE — Assessment & Plan Note (Signed)
-  check A1c with labs

## 2021-02-18 NOTE — Assessment & Plan Note (Signed)
BP Readings from Last 3 Encounters:  02/18/21 (!) 134/94  02/01/21 129/90  01/14/21 130/88   -BP slightly elevated today, but has been good previously

## 2021-02-19 ENCOUNTER — Other Ambulatory Visit: Payer: Self-pay | Admitting: Nurse Practitioner

## 2021-02-19 MED ORDER — ROSUVASTATIN CALCIUM 20 MG PO TABS: 20.0000 mg | ORAL_TABLET | Freq: Every day | ORAL | 3 refills | Status: DC

## 2021-02-19 NOTE — Progress Notes (Signed)
A1c is at goal at 7. However, LDL is 153, which is above the goal of 70. I sent in rosuvastatin for cholesterol.

## 2021-02-20 LAB — CBC WITH DIFFERENTIAL/PLATELET
Basophils Absolute: 0 10*3/uL (ref 0.0–0.2)
Basos: 1 %
EOS (ABSOLUTE): 0.5 10*3/uL — ABNORMAL HIGH (ref 0.0–0.4)
Eos: 12 %
Hematocrit: 40.3 % (ref 34.0–46.6)
Hemoglobin: 13.4 g/dL (ref 11.1–15.9)
Immature Grans (Abs): 0 10*3/uL (ref 0.0–0.1)
Immature Granulocytes: 1 %
Lymphocytes Absolute: 1.1 10*3/uL (ref 0.7–3.1)
Lymphs: 25 %
MCH: 27.1 pg (ref 26.6–33.0)
MCHC: 33.3 g/dL (ref 31.5–35.7)
MCV: 82 fL (ref 79–97)
Monocytes Absolute: 0.4 10*3/uL (ref 0.1–0.9)
Monocytes: 10 %
Neutrophils Absolute: 2.2 10*3/uL (ref 1.4–7.0)
Neutrophils: 51 %
Platelets: 301 10*3/uL (ref 150–450)
RBC: 4.94 x10E6/uL (ref 3.77–5.28)
RDW: 13.2 % (ref 11.7–15.4)
WBC: 4.3 10*3/uL (ref 3.4–10.8)

## 2021-02-20 LAB — TSH: TSH: 2.43 u[IU]/mL (ref 0.450–4.500)

## 2021-02-20 LAB — HEPATITIS C ANTIBODY: Hep C Virus Ab: <0.1 s/co ratio (ref 0.0–0.9)

## 2021-02-20 LAB — LIPID PANEL WITH LDL/HDL RATIO
Cholesterol, Total: 265 mg/dL — ABNORMAL HIGH (ref 100–199)
HDL: 70 mg/dL (ref >39)
LDL Chol Calc (NIH): 153 mg/dL — ABNORMAL HIGH (ref 0–99)
LDL/HDL Ratio: 2.2 ratio (ref 0.0–3.2)
Triglycerides: 230 mg/dL — ABNORMAL HIGH (ref 0–149)
VLDL Cholesterol Cal: 42 mg/dL — ABNORMAL HIGH (ref 5–40)

## 2021-02-20 LAB — HIV ANTIBODY (ROUTINE TESTING W REFLEX): HIV Screen 4th Generation wRfx: NONREACTIVE

## 2021-02-20 LAB — CMP14+EGFR
ALT: 39 IU/L — ABNORMAL HIGH (ref 0–32)
AST: 23 IU/L (ref 0–40)
Albumin/Globulin Ratio: 2.1 (ref 1.2–2.2)
Albumin: 5.2 g/dL — ABNORMAL HIGH (ref 3.9–5.0)
Alkaline Phosphatase: 93 IU/L (ref 44–121)
BUN/Creatinine Ratio: 18 (ref 9–23)
BUN: 11 mg/dL (ref 6–20)
Bilirubin Total: 0.3 mg/dL (ref 0.0–1.2)
CO2: 19 mmol/L — ABNORMAL LOW (ref 20–29)
Calcium: 9.6 mg/dL (ref 8.7–10.2)
Chloride: 100 mmol/L (ref 96–106)
Creatinine, Ser: 0.61 mg/dL (ref 0.57–1.00)
Globulin, Total: 2.5 g/dL (ref 1.5–4.5)
Glucose: 143 mg/dL — ABNORMAL HIGH (ref 70–99)
Potassium: 4.5 mmol/L (ref 3.5–5.2)
Sodium: 138 mmol/L (ref 134–144)
Total Protein: 7.7 g/dL (ref 6.0–8.5)
eGFR: 123 mL/min/{1.73_m2} (ref >59)

## 2021-02-20 LAB — HEMOGLOBIN A1C
Est. average glucose Bld gHb Est-mCnc: 154 mg/dL
Hgb A1c MFr Bld: 7 % — ABNORMAL HIGH (ref 4.8–5.6)

## 2021-02-20 LAB — MICROALBUMIN / CREATININE URINE RATIO
Creatinine, Urine: 58.6 mg/dL
Microalb/Creat Ratio: 270 mg/g creat — ABNORMAL HIGH (ref 0–29)
Microalbumin, Urine: 158.2 ug/mL

## 2021-02-24 LAB — CYTOLOGY - PAP
Comment: NEGATIVE
Diagnosis: NEGATIVE
Diagnosis: REACTIVE
High risk HPV: NEGATIVE

## 2021-03-01 ENCOUNTER — Other Ambulatory Visit (HOSPITAL_COMMUNITY): Payer: Self-pay

## 2021-03-01 ENCOUNTER — Inpatient Hospital Stay (HOSPITAL_COMMUNITY): Payer: Medicaid Other | Attending: Hematology

## 2021-03-01 ENCOUNTER — Inpatient Hospital Stay (HOSPITAL_COMMUNITY): Payer: Medicaid Other

## 2021-03-01 ENCOUNTER — Other Ambulatory Visit: Payer: Self-pay

## 2021-03-01 VITALS — BP 119/92 | HR 89 | Temp 97.1°F | Resp 17 | Wt 173.8 lb

## 2021-03-01 DIAGNOSIS — C9000 Multiple myeloma not having achieved remission: Secondary | ICD-10-CM | POA: Diagnosis not present

## 2021-03-01 LAB — CBC WITH DIFFERENTIAL/PLATELET
Abs Immature Granulocytes: 0.02 10*3/uL (ref 0.00–0.07)
Basophils Absolute: 0.1 10*3/uL (ref 0.0–0.1)
Basophils Relative: 2 %
Eosinophils Absolute: 0.3 10*3/uL (ref 0.0–0.5)
Eosinophils Relative: 11 %
HCT: 39.2 % (ref 36.0–46.0)
Hemoglobin: 12.8 g/dL (ref 12.0–15.0)
Immature Granulocytes: 1 %
Lymphocytes Relative: 37 %
Lymphs Abs: 1.1 10*3/uL (ref 0.7–4.0)
MCH: 28.3 pg (ref 26.0–34.0)
MCHC: 32.7 g/dL (ref 30.0–36.0)
MCV: 86.5 fL (ref 80.0–100.0)
Monocytes Absolute: 0.3 10*3/uL (ref 0.1–1.0)
Monocytes Relative: 11 %
Neutro Abs: 1.2 10*3/uL — ABNORMAL LOW (ref 1.7–7.7)
Neutrophils Relative %: 38 %
Platelets: 281 10*3/uL (ref 150–400)
RBC: 4.53 MIL/uL (ref 3.87–5.11)
RDW: 14 % (ref 11.5–15.5)
WBC: 3 10*3/uL — ABNORMAL LOW (ref 4.0–10.5)
nRBC: 0 % (ref 0.0–0.2)

## 2021-03-01 LAB — COMPREHENSIVE METABOLIC PANEL
ALT: 49 U/L — ABNORMAL HIGH (ref 0–44)
AST: 31 U/L (ref 15–41)
Albumin: 4.6 g/dL (ref 3.5–5.0)
Alkaline Phosphatase: 77 U/L (ref 38–126)
Anion gap: 7 (ref 5–15)
BUN: 12 mg/dL (ref 6–20)
CO2: 24 mmol/L (ref 22–32)
Calcium: 9.2 mg/dL (ref 8.9–10.3)
Chloride: 105 mmol/L (ref 98–111)
Creatinine, Ser: 0.62 mg/dL (ref 0.44–1.00)
GFR, Estimated: >60 mL/min (ref >60)
Glucose, Bld: 191 mg/dL — ABNORMAL HIGH (ref 70–99)
Potassium: 3.5 mmol/L (ref 3.5–5.1)
Sodium: 136 mmol/L (ref 135–145)
Total Bilirubin: 0.7 mg/dL (ref 0.3–1.2)
Total Protein: 7.6 g/dL (ref 6.5–8.1)

## 2021-03-01 LAB — LACTATE DEHYDROGENASE: LDH: 162 U/L (ref 98–192)

## 2021-03-01 LAB — PREGNANCY, URINE: Preg Test, Ur: NEGATIVE

## 2021-03-01 MED ORDER — LENALIDOMIDE 10 MG PO CAPS: ORAL_CAPSULE | ORAL | 0 refills | Status: DC

## 2021-03-01 MED ORDER — SODIUM CHLORIDE 0.9 % IV SOLN: INTRAVENOUS | Status: DC

## 2021-03-01 MED ORDER — ZOLEDRONIC ACID 4 MG/100ML IV SOLN
4.0000 mg | Freq: Once | INTRAVENOUS | Status: AC
  Administered 2021-03-01: 4 mg via INTRAVENOUS
  Filled 2021-03-01: qty 100

## 2021-03-01 NOTE — Progress Notes (Signed)
Patient presents today for Zometa infusion.  Patient is in satisfactory condition with no complaints voiced.  Vital signs are stable.  Labs reviewed and all are within treatment parameters.  We will proceed with treatment per MD orders.  Patient tolerated treatment well with no complaints voiced.  Patient left ambulatory in stable condition.  Vital signs stable at discharge.  Follow up as scheduled.

## 2021-03-01 NOTE — Patient Instructions (Signed)
[facility]  Discharge Instructions: Thank you for choosing [facility] to provide your oncology and hematology care.  If you have a lab appointment with the Cancer Center, please come in thru the Main Entrance and check in at the main information desk.  Wear comfortable clothing and clothing appropriate for easy access to any Portacath or PICC line.   We strive to give you quality time with your provider. You may need to reschedule your appointment if you arrive late (15 or more minutes).  Arriving late affects you and other patients whose appointments are after yours.  Also, if you miss three or more appointments without notifying the office, you may be dismissed from the clinic at the provider's discretion.      For prescription refill requests, have your pharmacy contact our office and allow 72 hours for refills to be completed.        To help prevent nausea and vomiting after your treatment, we encourage you to take your nausea medication as directed.  BELOW ARE SYMPTOMS THAT SHOULD BE REPORTED IMMEDIATELY: *FEVER GREATER THAN 100.4 F (38 C) OR HIGHER *CHILLS OR SWEATING *NAUSEA AND VOMITING THAT IS NOT CONTROLLED WITH YOUR NAUSEA MEDICATION *UNUSUAL SHORTNESS OF BREATH *UNUSUAL BRUISING OR BLEEDING *URINARY PROBLEMS (pain or burning when urinating, or frequent urination) *BOWEL PROBLEMS (unusual diarrhea, constipation, pain near the anus) TENDERNESS IN MOUTH AND THROAT WITH OR WITHOUT PRESENCE OF ULCERS (sore throat, sores in mouth, or a toothache) UNUSUAL RASH, SWELLING OR PAIN  UNUSUAL VAGINAL DISCHARGE OR ITCHING   Items with * indicate a potential emergency and should be followed up as soon as possible or go to the Emergency Department if any problems should occur.  Please show the CHEMOTHERAPY ALERT CARD or IMMUNOTHERAPY ALERT CARD at check-in to the Emergency Department and triage nurse.  Should you have questions after your visit or need to cancel  or reschedule your appointment, please contact [facility] [phone]  and follow the prompts.  Office hours are 8:00 a.m. to 4:30 p.m. Monday - Friday. Please note that voicemails left after 4:00 p.m. may not be returned until the following business day.  We are closed weekends and major holidays. You have access to a nurse at all times for urgent questions. Please call the main number to the clinic [phone] and follow the prompts.  For any non-urgent questions, you may also contact your provider using MyChart. We now offer e-Visits for anyone 18 and older to request care online for non-urgent symptoms. For details visit [email or web address].   Also download the MyChart app! Go to the app store, search "MyChart", open the app, select [facility], and log in with your MyChart username and password.  Due to Covid, a mask is required upon entering the hospital/clinic. If you do not have a mask, one will be given to you upon arrival. For doctor visits, patients may have 1 support person aged 18 or older with them. For treatment visits, patients cannot have anyone with them due to current Covid guidelines and our immunocompromised population.  

## 2021-03-01 NOTE — Telephone Encounter (Signed)
Chart reviewed. Revlimid refilled per last office note with Dr. Katragadda.  

## 2021-03-02 LAB — KAPPA/LAMBDA LIGHT CHAINS
Kappa free light chain: 12.1 mg/L (ref 3.3–19.4)
Kappa, lambda light chain ratio: 1.21 (ref 0.26–1.65)
Lambda free light chains: 10 mg/L (ref 5.7–26.3)

## 2021-03-03 LAB — PROTEIN ELECTROPHORESIS, SERUM
A/G Ratio: 1.4 (ref 0.7–1.7)
Albumin ELP: 4.2 g/dL (ref 2.9–4.4)
Alpha-1-Globulin: 0.2 g/dL (ref 0.0–0.4)
Alpha-2-Globulin: 0.9 g/dL (ref 0.4–1.0)
Beta Globulin: 1.3 g/dL (ref 0.7–1.3)
Gamma Globulin: 0.6 g/dL (ref 0.4–1.8)
Globulin, Total: 3 g/dL (ref 2.2–3.9)
Total Protein ELP: 7.2 g/dL (ref 6.0–8.5)

## 2021-03-04 LAB — IMMUNOFIXATION ELECTROPHORESIS
IgA: 17 mg/dL — ABNORMAL LOW (ref 87–352)
IgG (Immunoglobin G), Serum: 708 mg/dL (ref 586–1602)
IgM (Immunoglobulin M), Srm: 14 mg/dL — ABNORMAL LOW (ref 26–217)
Total Protein ELP: 7.3 g/dL (ref 6.0–8.5)

## 2021-03-08 ENCOUNTER — Other Ambulatory Visit: Payer: Self-pay | Admitting: *Deleted

## 2021-03-08 ENCOUNTER — Other Ambulatory Visit: Payer: Self-pay

## 2021-03-08 NOTE — Patient Outreach (Signed)
Medicaid Managed Care   Nurse Care Manager Note  03/08/2021 Name:  Chyna Kneece MRN:  [id number] DOB:  04-25-1991  Latreece Mochizuki is an 30 y.o. year old female who is a primary patient of [name], NP.  The [facility] Managed Care Coordination team was consulted for assistance with:    HTN HLD  Ms. Releford was given information about Medicaid Managed Care Coordination team services today. [name] Patient agreed to services and verbal consent obtained.  Engaged with patient by telephone for follow up visit in response to provider referral for case management and/or care coordination services.   Assessments/Interventions:  Review of past medical history, allergies, medications, health status, including review of consultants reports, laboratory and other test data, was performed as part of comprehensive evaluation and provision of chronic care management services.  SDOH (Social Determinants of Health) assessments and interventions performed: SDOH Interventions    Flowsheet Row Most Recent Value  SDOH Interventions   Social Connections Interventions Intervention Not Indicated       Care Plan  No Known Allergies  Medications Reviewed Today     Reviewed by Tally Due, RN (Registered Nurse) on 03/01/21 at 1336  Med List Status: <None>   Medication Order Taking? Sig Documenting Provider Last Dose Status Informant  ACCU-CHEK GUIDE test strip [id number] Yes  [provider] Taking Active Self  Accu-Chek Softclix Lancets lancets [id number] Yes 1 each 3 (three) times daily. [provider] Taking Active Self  acyclovir (ZOVIRAX) 800 MG tablet [id number] Yes Take by mouth. [provider] Taking Active   amLODipine (NORVASC) 10 MG tablet [id number] Yes Take 1 tablet (10 mg total) by mouth daily. [name], MD Taking Active   aspirin 325 MG tablet [id number] Yes Take 325 mg by mouth daily. [provider] Taking Active   Blood Glucose  Monitoring Suppl (ACCU-CHEK GUIDE) w/Device KIT [id number] Yes  [provider] Taking Active Self  calcium carbonate (OS-CAL) 1250 (500 Ca) MG chewable tablet [id number] Yes Chew 1 tablet by mouth daily. [provider] Taking Active   cholecalciferol (VITAMIN D3) 25 MCG (1000 UNIT) tablet [id number] Yes Take 1,000 Units by mouth daily. [provider] Taking Active   folic acid (FOLVITE) 1 MG tablet [id number] Yes Take 1 mg by mouth daily. [provider] Taking Active   LANTUS SOLOSTAR 100 UNIT/ML Solostar Pen [id number] Yes Inject 30 Units into the skin at bedtime. [name], NP Taking Active   lenalidomide (REVLIMID) 10 MG capsule [id number] Yes Take 1 capsule daily for 21 days, then take 7 days off [name], MD Taking Active   metoprolol tartrate (LOPRESSOR) 25 MG tablet [id number] Yes Take 1 tablet (25 mg total) by mouth daily. [name], MD Taking Active   Multiple Vitamin (MULTIVITAMIN) tablet [id number] Yes Take 1 tablet by mouth daily. [provider] Taking Active   ondansetron (ZOFRAN) 8 MG tablet [id number] Yes Take by mouth. [provider] Taking Active   rosuvastatin (CRESTOR) 20 MG tablet [id number] Yes Take 1 tablet (20 mg total) by mouth daily. [name], NP Taking Active   sulfamethoxazole-trimethoprim (BACTRIM DS) 800-160 MG tablet [id number] Yes Take 1 tablet by mouth 3 (three) times a week. [provider] Taking Active   VITAMIN D PO [id number] Yes Take by mouth. [provider] Taking Active   Med List Note [name], CPhT 03/07/13 1442): No preferred pharmacy.             Patient  Active Problem List   Diagnosis Date Noted   Pap smear for cervical cancer screening 02/18/2021   T1DM (type 1 diabetes mellitus) ([place]) 06/11/2020   Multiple myeloma ([place]) 03/09/2020   Encounter for general adult medical examination with abnormal findings 03/09/2020   Chronic  hypertension 05/24/2019    Conditions to be addressed/monitored per PCP order:  HTN and HLD  Care Plan : Hypertension (Adult)  Updates made by Melissa Montane, RN since 03/08/2021 12:00 AM     Problem: Hypertension (Hypertension)      Long-Range Goal: Hypertension Monitored   Start Date: 12/08/2020  Expected End Date: 04/07/2021  Recent Progress: On track  Priority: High  Note:   Objective:  Last practice recorded BP readings:  BP Readings from Last 3 Encounters:  03/01/21 (!) 119/92  02/18/21 (!) 134/94  02/01/21 129/90   Most recent eGFR/CrCl:  Lab Results  Component Value Date   EGFR 123 02/18/2021    No components found for: CRCL Current Barriers:  Knowledge Deficits related to understanding of medications prescribed for management of hypertension Unable to independently establish new PCP Case Manager Clinical Goal(s):  patient will verbalize understanding of plan for hypertension management patient will attend all scheduled medical appointments and call for new PCP appointment-Met patient will demonstrate improved adherence to prescribed treatment plan for hypertension as evidenced by taking all medications as prescribed, monitoring and recording blood pressure as directed, adhering to low sodium/DASH diet Interventions:  Evaluation of current treatment plan related to hypertension self management and patient's adherence to plan as established by provider. Provided education to patient re: stroke prevention, s/s of heart attack and stroke, DASH diet, complications of uncontrolled blood pressure Discussed new medication-Rosuvastatin, provided education for lowering cholesterol Discussed plans with patient for ongoing care management follow up and provided patient with direct contact information for care management team Advised patient, providing education and rationale, to monitor blood pressure daily and record, calling PCP for findings outside established parameters.   Reviewed scheduled/upcoming provider appointments including: 10/31-Hematology and 06/21/21 with PCP Collaborate with Ubaldo Glassing, [place] for assistance with scheduling Eye exam Patient Goals/Self-Care Activities:  - take your BP medication before provider appointments - attend new patient appointment with PCP on 9/1-take all medications with you to your appointment - request a BP cuff at your PCP appointment  - check blood pressure weekly - choose a place to take my blood pressure (home, clinic or office, retail store) - write blood pressure results in a log or diary - take all medications as directed  - adhere to dash diet -increase exercise to 30 min/day for 5 days a week RNCM will follow up on 04/07/21 @ 10:30am      Follow Up:  Patient agrees to Care Plan and Follow-up.  Plan: The Managed Medicaid care management team will reach out to the patient again over the next 30 days.  Date/time of next scheduled RN care management/care coordination outreach:  04/07/21 @ 10:30a  Lurena Joiner RN, [place] RN Care Coordinator

## 2021-03-08 NOTE — Patient Instructions (Signed)
Visit Information  Ms. Fogleman was given information about Medicaid Managed Care team care coordination services as a part of their [facility] Medicaid benefit. Annaliz Aven verbally consented to engagement with the [facility] Managed Care team.   If you are experiencing a medical emergency, please call 911 or report to your local emergency department or urgent care.   If you have a non-emergency medical problem during routine business hours, please contact your provider's office and ask to speak with a nurse.   For questions related to your [facility] health plan, please call: [phone] or go here:[email or web address]  If you would like to schedule transportation through your [facility] plan, please call the following number at least 2 days in advance of your appointment: [phone].  Call the [place] at [phone], at any time, 24 hours a day, 7 days a week. If you are in danger or need immediate medical attention call 911.  If you would like help to quit smoking, call 1-800-QUIT-NOW 807-182-1973) OR Espaol: 1-855-Djelo-Ya (5-573-220-2542) o para ms informacin haga clic aqu or Text READY to 200-400 to register via text  Ms. Greaser - following are the goals we discussed in your visit today:   Goals Addressed             This Visit's Progress    Track and Manage My Blood Pressure-Hypertension       Timeframe:  Long-Range Goal Priority:  High Start Date:   12/08/20                          Expected End Date: 04/07/21                      Follow Up Date 04/07/21    - take your BP medication before provider appointments - attend new patient appointment with PCP on 9/1-take all medications with you to your appointment - request a BP cuff at your PCP appointment  - check blood pressure weekly - choose a place to take my blood pressure (home, clinic or office, retail store) - write blood pressure results in a log or diary - take  all medications as directed  - adhere to dash diet   Why is this important?   You won't feel high blood pressure, but it can still hurt your blood vessels.  High blood pressure can cause heart or kidney problems. It can also cause a stroke.  Making lifestyle changes like losing a little weight or eating less salt will help.  Checking your blood pressure at home and at different times of the day can help to control blood pressure.  If the doctor prescribes medicine remember to take it the way the doctor ordered.  Call the office if you cannot afford the medicine or if there are questions about it.             Please see education materials related to lowering cholesterol provided by MyChart link.  Patient has access to MyChart and can view provided education  Telephone follow up appointment with Managed Medicaid care management team member scheduled for:04/07/21 @ 10:30am  Lurena Joiner RN, BSN [place] RN Care Coordinator   Following is a copy of your plan of care:  Patient Care Plan: Diabetes Type 1 (Adult)  Completed 01/07/2021   Problem Identified: Glycemic Management (Diabetes, Type 1) Resolved 12/08/2020     Long-Range Goal: Glycemic Management Optimized Completed 10/06/2020  Start Date: 06/17/2020  Expected End Date: 10/06/2020  This Visit's Progress: On track  Recent Progress: On track  Priority: High  Note:   CARE PLAN ENTRY Medicaid Managed Care (see longtitudinal plan of care for additional care plan information)  Objective:  Lab Results  Component Value Date   HGBA1C 8.8 (H) 03/11/2020   Lab Results  Component Value Date   CREATININE 0.47 08/21/2020   CREATININE 0.46 06/22/2020   CREATININE 0.53 06/09/2020  Patient reported cbg findings: postprandial readings typically 90-140  Current Barriers:  Knowledge Deficits related to basic Diabetes pathophysiology and self care/management-Ms Rom has a recent diagnosis of Type 1  Diabetes Mellitis and Multiple Myeloma. Treatment for MM is 2 weeks on and 1 week off. During her treatment weeks her BS readings are higher than usual due to her receiving steroids. She reports that at the time of diagnosis she received very helpful diabetic education. She is managing DM with diet and insulin. She is scheduled for a bone marrow transplant at [facility]. Due to a +COVID test on 2/14, bone marrow transplant will be rescheduled. Ms. Panozzo will have her wisdom teeth removed on 4/7 and transplant scheduled to begin 09/09/20. 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 She reports doing well. She understands that she needs to wear her mask and gloves in public, avoid crowded places, and no visitors for 100 days per provider. Patient reports improved blood sugar readings since BMT. Patient aware of follow up appointments and denies any needs at this time. Case Manager Clinical Goal(s):  patient will demonstrate improved adherence to prescribed treatment plan for diabetes self care/management as evidenced by:  daily monitoring and recording of CBG-met adherence to ADA/ carb modified diet-met exercise 3-5 days/week-met adherence to prescribed medication regimen-met Interventions:  Provided education to patient about basic DM diet Reviewed medications with patient and discussed importance of medication adherence Discussed plans with patient for ongoing care management follow up and provided patient with direct contact information for care management team Reviewed scheduled/upcoming provider appointments  Review of patient status, including review of consultants reports, relevant laboratory and other test results, and medications completed. Discussed follow up after bone marrow transplant, verified that Ms. Mclinden had contact information for her Navigator at [facility] should any questions arise Patient Self Care Activities:  - call to cancel if needed - keep a calendar with prescription refill  dates - keep a calendar with appointment dates  - schedule a follow up appointment with PCP for evaluation of diabetes and HTN - check blood sugar at prescribed times - check blood sugar if I feel it is too high or too low - enter blood sugar readings and medication or insulin into daily log - take the blood sugar log to all doctor visits - take the blood sugar meter to all doctor visits - adhere to a diabetic diet - schedule appointment with eye doctor   Please see past updates related to this goal by clicking on the "Past Updates" button in the selected goal     Patient Care Plan: Medication Management     Problem Identified: Health Promotion or Disease Self-Management (General Plan of Care)      Goal: Medication Management   Note:   Current Barriers:  Unable to achieve control of DM    Pharmacist Clinical Goal(s):  Over the next 30 days, patient will achieve control of DM as evidenced by current glucose levels  through collaboration with PharmD and provider.  Patient  will find and schedule a visit with a PCP  Interventions: Inter-disciplinary care team collaboration (see longitudinal plan of care) Comprehensive medication review performed; medication list updated in electronic medical record    Patient Goals/Self-Care Activities Over the next 30 days, patient will:  - take medications as prescribed check glucose Daily, document, and provide at future appointments  Follow Up Plan: The care management team will reach out to the patient again over the next 30 days.       Patient Care Plan: Hypertension (Adult)     Problem Identified: Hypertension (Hypertension)      Long-Range Goal: Hypertension Monitored   Start Date: 12/08/2020  Expected End Date: 04/07/2021  Recent Progress: On track  Priority: High  Note:   Objective:  Last practice recorded BP readings:  BP Readings from Last 3 Encounters:  03/01/21 (!) 119/92  02/18/21 (!) 134/94  02/01/21 129/90    Most recent eGFR/CrCl:  Lab Results  Component Value Date   EGFR 123 02/18/2021    No components found for: CRCL Current Barriers:  Knowledge Deficits related to understanding of medications prescribed for management of hypertension Unable to independently establish new PCP Case Manager Clinical Goal(s):  patient will verbalize understanding of plan for hypertension management patient will attend all scheduled medical appointments and call for new PCP appointment-Met patient will demonstrate improved adherence to prescribed treatment plan for hypertension as evidenced by taking all medications as prescribed, monitoring and recording blood pressure as directed, adhering to low sodium/DASH diet Interventions:  Evaluation of current treatment plan related to hypertension self management and patient's adherence to plan as established by provider. Provided education to patient re: stroke prevention, s/s of heart attack and stroke, DASH diet, complications of uncontrolled blood pressure Discussed new medication-Rosuvastatin, provided education for lowering cholesterol Discussed plans with patient for ongoing care management follow up and provided patient with direct contact information for care management team Advised patient, providing education and rationale, to monitor blood pressure daily and record, calling PCP for findings outside established parameters.  Reviewed scheduled/upcoming provider appointments including: 10/31-Hematology and 06/21/21 with PCP Collaborate with Ubaldo Glassing, [place] for assistance with scheduling Eye exam Patient Goals/Self-Care Activities:  - take your BP medication before provider appointments - attend new patient appointment with PCP on 9/1-take all medications with you to your appointment - request a BP cuff at your PCP appointment  - check blood pressure weekly - choose a place to take my blood pressure (home, clinic or office, retail store) - write blood pressure  results in a log or diary - take all medications as directed  - adhere to dash diet -increase exercise to 30 min/day for 5 days a week RNCM will follow up on 04/07/21 @ 10:30am

## 2021-03-11 ENCOUNTER — Other Ambulatory Visit: Payer: Self-pay

## 2021-03-11 NOTE — Patient Outreach (Signed)
Medicaid Managed Care Social Work Note  03/11/2021 Name:  Diamond Robbins MRN:  [id number] DOB:  03-Apr-1991  Diamond Robbins is an 30 y.o. year old female who is a primary patient of [name], NP.  The [facility] Managed Care Coordination team was consulted for assistance with:   eye exams  Ms. Nierman was given information about Medicaid Managed Care Coordination team services today. [name] Patient agreed to services and verbal consent obtained.  Engaged with patient  for by telephone forinitial visit in response to referral for case management and/or care coordination services.   Assessments/Interventions:  Review of past medical history, allergies, medications, health status, including review of consultants reports, laboratory and other test data, was performed as part of comprehensive evaluation and provision of chronic care management services.  SDOH: (Social Determinant of Health) assessments and interventions performed: BSW receieved a referral stating patient needed assistance with locating a eye doctor that accepts Medicaid. BSW contacted several eye doctors in [place] and [facility] and [name] accepts Medicaid. BSW contacted patient and provided her with the phone number for [facility]. Patient stated no other resources are needed at this time.   Advanced Directives Status:  Not addressed in this encounter.  Care Plan                 No Known Allergies  Medications Reviewed Today     Reviewed by Tally Due, RN (Registered Nurse) on 03/01/21 at 1336  Med List Status: <None>   Medication Order Taking? Sig Documenting Provider Last Dose Status Informant  ACCU-CHEK GUIDE test strip [id number] Yes  [provider] Taking Active Self  Accu-Chek Softclix Lancets lancets [id number] Yes 1 each 3 (three) times daily. [provider] Taking Active Self  acyclovir (ZOVIRAX) 800 MG tablet [id number] Yes Take by mouth. [provider]  Taking Active   amLODipine (NORVASC) 10 MG tablet [id number] Yes Take 1 tablet (10 mg total) by mouth daily. [name], MD Taking Active   aspirin 325 MG tablet [id number] Yes Take 325 mg by mouth daily. [provider] Taking Active   Blood Glucose Monitoring Suppl (ACCU-CHEK GUIDE) w/Device KIT [id number] Yes  [provider] Taking Active Self  calcium carbonate (OS-CAL) 1250 (500 Ca) MG chewable tablet [id number] Yes Chew 1 tablet by mouth daily. [provider] Taking Active   cholecalciferol (VITAMIN D3) 25 MCG (1000 UNIT) tablet [id number] Yes Take 1,000 Units by mouth daily. [provider] Taking Active   folic acid (FOLVITE) 1 MG tablet [id number] Yes Take 1 mg by mouth daily. [provider] Taking Active   LANTUS SOLOSTAR 100 UNIT/ML Solostar Pen [id number] Yes Inject 30 Units into the skin at bedtime. [name], NP Taking Active   lenalidomide (REVLIMID) 10 MG capsule [id number] Yes Take 1 capsule daily for 21 days, then take 7 days off [name], MD Taking Active   metoprolol tartrate (LOPRESSOR) 25 MG tablet [id number] Yes Take 1 tablet (25 mg total) by mouth daily. [name], MD Taking Active   Multiple Vitamin (MULTIVITAMIN) tablet [id number] Yes Take 1 tablet by mouth daily. [provider] Taking Active   ondansetron (ZOFRAN) 8 MG tablet [id number] Yes Take by mouth. [provider] Taking Active   rosuvastatin (CRESTOR) 20 MG tablet [id number] Yes Take 1 tablet (20 mg total) by mouth daily. [name], NP Taking Active   sulfamethoxazole-trimethoprim (BACTRIM DS) 800-160 MG tablet [id number] Yes Take 1 tablet by mouth 3 (three)  times a week. [provider] Taking Active   VITAMIN D PO [id number] Yes Take by mouth. [provider] Taking Active   Med List Note [name], CPhT 03/07/13 1442): No preferred pharmacy.             Patient Active Problem List    Diagnosis Date Noted   Pap smear for cervical cancer screening 02/18/2021   T1DM (type 1 diabetes mellitus) ([place]) 06/11/2020   Multiple myeloma ([place]) 03/09/2020   Encounter for general adult medical examination with abnormal findings 03/09/2020   Chronic hypertension 05/24/2019    Conditions to be addressed/monitored per PCP order:   eye exam  There are no care plans that you recently modified to display for this patient.   Follow up:  Patient agrees to Care Plan and Follow-up.  Plan: The Managed Medicaid care management team will reach out to the patient again over the next 30 days.  Date/time of next scheduled Social Work care management/care coordination outreach:  04/12/21  [name], Arita Miss, [place] Medicaid Team  [phone]

## 2021-03-11 NOTE — Patient Instructions (Signed)
Visit Information  Ms. Pedregon was given information about Medicaid Managed Care team care coordination services as a part of their [facility] Medicaid benefit. Tracyann Duffell verbally consented to engagement with the [facility] Managed Care team.   If you are experiencing a medical emergency, please call 911 or report to your local emergency department or urgent care.   If you have a non-emergency medical problem during routine business hours, please contact your provider's office and ask to speak with a nurse.   For questions related to your [facility] health plan, please call: [phone] or go here:[email or web address]  If you would like to schedule transportation through your [facility] plan, please call the following number at least 2 days in advance of your appointment: [phone].  Call the [place] at [phone], at any time, 24 hours a day, 7 days a week. If you are in danger or need immediate medical attention call 911.  If you would like help to quit smoking, call 1-800-QUIT-NOW (272)476-2387) OR Espaol: 1-855-Djelo-Ya (1-165-790-3833) o para ms informacin haga clic aqu or Text READY to 200-400 to register via text  Ms. Ellenbecker - following are the goals we discussed in your visit today:   Goals Addressed   None     Social Worker will follow up with resources given in 30 days.   [name], BSW, [place] Managed Medicaid Team  [phone]   Following is a copy of your plan of care:

## 2021-03-13 NOTE — Progress Notes (Shared)
Diamond Robbins, Diamond Robbins [id number]   CLINIC:  Medical Oncology/Hematology  PCP:  Diamond Larsson, NP [address]  [address] / [place] [address] [phone]   REASON FOR VISIT:  Follow-up for ***  PRIOR THERAPY: ***  NGS Results: ***  CURRENT THERAPY: ***  BRIEF ONCOLOGIC HISTORY:  Oncology History  Multiple myeloma ([place])  03/09/2020 Initial Diagnosis   Multiple myeloma ([place])   03/31/2020 - 06/12/2020 Chemotherapy            CANCER STAGING: Cancer Staging No matching staging information was found for the patient.  INTERVAL HISTORY:  [name], a 30 y.o. female, returns for routine follow-up of her ***. Diamond Robbins was last seen on {XX/XX/XXXX}.    REVIEW OF SYSTEMS:  Review of Systems - Oncology  PAST MEDICAL/SURGICAL HISTORY:  Past Medical History:  Diagnosis Date   Cancer ([place]) 08/01/2020   multiple myeloma   DKA (diabetic ketoacidosis) ([place]) 02/23/2020   Hypertension    Type 1 diabetes mellitus ([place]) 02/23/2020   Past Surgical History:  Procedure Laterality Date   BONE MARROW TRANSPLANT     NO PAST SURGERIES      SOCIAL HISTORY:  Social History   Socioeconomic History   Marital status: Married    Spouse name: Not on file   Number of children: Not on file   Years of education: Not on file   Highest education level: Not on file  Occupational History   Occupation: unemployed    Comment: can go back to work Jan 2023  Tobacco Use   Smoking status: Never   Smokeless tobacco: Never  Vaping Use   Vaping Use: Never used  Substance and Sexual Activity   Alcohol use: Not Currently    Comment: occ- 0-1 drinks per week; maybe 1 drink every 6 months   Drug use: No   Sexual activity: Yes    Birth control/protection: Condom  Other Topics Concern   Not on file  Social History Narrative   Has 2 step-daughters   Social Determinants of Health   Financial Resource Strain: Not on file  Food  Insecurity: No Food Insecurity   Worried About Charity fundraiser in the Last Year: Never true   [place] in the Last Year: Never true  Transportation Needs: No Transportation Needs   Lack of Transportation (Medical): No   Lack of Transportation (Non-Medical): No  Physical Activity: Not on file  Stress: Not on file  Social Connections: Socially Integrated   Frequency of Communication with Friends and Family: More than three times a week   Frequency of Social Gatherings with Friends and Family: More than three times a week   Attends Religious Services: More than 4 times per year   Active Member of [employer] or Organizations: Yes   Attends Archivist Meetings: Never   Marital Status: Married  Human resources officer Violence: Not on file    FAMILY HISTORY:  Family History  Problem Relation Age of Onset   Hypertension Mother    Diabetes Mother    Hypertension Father    Diabetes Maternal Grandmother     CURRENT MEDICATIONS:  Current Outpatient Medications  Medication Sig Dispense Refill   ACCU-CHEK GUIDE test strip      Accu-Chek Softclix Lancets lancets 1 each 3 (three) times daily.     acyclovir (ZOVIRAX) 800 MG tablet Take by mouth.     amLODipine (NORVASC) 10 MG tablet  Take 1 tablet (10 mg total) by mouth daily. 30 tablet 1   aspirin 325 MG tablet Take 325 mg by mouth daily.     Blood Glucose Monitoring Suppl (ACCU-CHEK GUIDE) w/Device KIT      calcium carbonate (OS-CAL) 1250 (500 Ca) MG chewable tablet Chew 1 tablet by mouth daily.     cholecalciferol (VITAMIN D3) 25 MCG (1000 UNIT) tablet Take 1,000 Units by mouth daily.     folic acid (FOLVITE) 1 MG tablet Take 1 mg by mouth daily.     LANTUS SOLOSTAR 100 UNIT/ML Solostar Pen Inject 30 Units into the skin at bedtime. 15 mL 0   lenalidomide (REVLIMID) 10 MG capsule Take 1 capsule daily for 21 days, then take 7 days off 21 capsule 0   metoprolol tartrate (LOPRESSOR) 25 MG tablet Take 1 tablet (25 mg total) by mouth  daily. 30 tablet 1   Multiple Vitamin (MULTIVITAMIN) tablet Take 1 tablet by mouth daily.     ondansetron (ZOFRAN) 8 MG tablet Take by mouth.     rosuvastatin (CRESTOR) 20 MG tablet Take 1 tablet (20 mg total) by mouth daily. 90 tablet 3   sulfamethoxazole-trimethoprim (BACTRIM DS) 800-160 MG tablet Take 1 tablet by mouth 3 (three) times a week.     VITAMIN D PO Take by mouth.     No current facility-administered medications for this visit.    ALLERGIES:  No Known Allergies  PHYSICAL EXAM:  Performance status (ECOG): {CHL ONC CV:[id number]}  There were no vitals filed for this visit. Wt Readings from Last 3 Encounters:  03/01/21 173 lb 12.8 oz (78.8 kg)  02/18/21 173 lb (78.5 kg)  02/01/21 170 lb 6.7 oz (77.3 kg)   Physical Exam   LABORATORY DATA:  I have reviewed the labs as listed.  CBC Latest Ref Rng & Units 03/01/2021 02/18/2021 02/01/2021  WBC 4.0 - 10.5 K/uL 3.0(L) 4.3 5.1  Hemoglobin 12.0 - 15.0 g/dL 12.8 13.4 12.8  Hematocrit 36.0 - 46.0 % 39.2 40.3 38.9  Platelets 150 - 400 K/uL 281 301 319   CMP Latest Ref Rng & Units 03/01/2021 02/18/2021 02/01/2021  Glucose 70 - 99 mg/dL 191(H) 143(H) 146(H)  BUN 6 - 20 mg/dL 12 11 13   Creatinine 0.44 - 1.00 mg/dL 0.62 0.61 0.74  Sodium 135 - 145 mmol/L 136 138 136  Potassium 3.5 - 5.1 mmol/L 3.5 4.5 3.7  Chloride 98 - 111 mmol/L 105 100 102  CO2 22 - 32 mmol/L 24 19(L) 21(L)  Calcium 8.9 - 10.3 mg/dL 9.2 9.6 9.4  Total Protein 6.5 - 8.1 g/dL 7.6 7.7 7.7  Total Bilirubin 0.3 - 1.2 mg/dL 0.7 0.3 0.4  Alkaline Phos 38 - 126 U/L 77 93 84  AST 15 - 41 U/L 31 23 24   ALT 0 - 44 U/L 49(H) 39(H) 38    DIAGNOSTIC IMAGING:  I have independently reviewed the scans and discussed with the patient. No results found.   ASSESSMENT:  ***   PLAN:  ***   Orders placed this encounter:  No orders of the defined types were placed in this encounter.    [name], MD [facility] [phone]   I, ***, am  acting as a scribe for Dr. [name].  {Add Barista Statement}

## 2021-03-15 ENCOUNTER — Inpatient Hospital Stay (HOSPITAL_COMMUNITY): Payer: Medicaid Other | Admitting: Hematology

## 2021-03-15 ENCOUNTER — Inpatient Hospital Stay (HOSPITAL_COMMUNITY): Payer: Medicaid Other

## 2021-03-15 NOTE — Progress Notes (Signed)
Diamond Robbins, [place] [address]   CLINIC:  Medical Oncology/Hematology  PCP:  Diamond Larsson, NP [address]  [address] / [place] [address] [phone]   REASON FOR VISIT:  Follow-up for IgA kappa multiple myeloma  PRIOR THERAPY: none  NGS Results: not done  CURRENT THERAPY: RVD and Darzalex Faspro on D1, 4, 8, 11, and 15; Revlimid 2 weeks on, 1 week off; Zometa every 4 weeks  BRIEF ONCOLOGIC HISTORY:  Oncology History  Multiple myeloma ([place])  03/09/2020 Initial Diagnosis   Multiple myeloma ([place])   03/31/2020 - 06/12/2020 Chemotherapy            CANCER STAGING: Cancer Staging No matching staging information was found for the patient.  INTERVAL HISTORY:  [name], a 30 y.o. female, returns for routine follow-up of her IgA kappa multiple myeloma. Diamond Robbins was last seen on 02/01/2021.   Today she reports feeling good. She is taking Revlimid and tolerating it well, and she has not started amlodipine as she is waiting to pick it up from her pharmacy. She denies n/v/d, leg swellings, and bone pains.   REVIEW OF SYSTEMS:  Review of Systems  Constitutional:  Negative for appetite change and fatigue.  Cardiovascular:  Negative for leg swelling.  Gastrointestinal:  Negative for diarrhea, nausea and vomiting.  Musculoskeletal:  Negative for arthralgias.  Neurological:  Negative for numbness.  All other systems reviewed and are negative.  PAST MEDICAL/SURGICAL HISTORY:  Past Medical History:  Diagnosis Date   Cancer ([place]) 08/01/2020   multiple myeloma   DKA (diabetic ketoacidosis) ([place]) 02/23/2020   Hypertension    Type 1 diabetes mellitus ([place]) 02/23/2020   Past Surgical History:  Procedure Laterality Date   BONE MARROW TRANSPLANT     NO PAST SURGERIES      SOCIAL HISTORY:  Social History   Socioeconomic History   Marital status: Married    Spouse name: Not on file   Number of children: Not on  file   Years of education: Not on file   Highest education level: Not on file  Occupational History   Occupation: unemployed    Comment: can go back to work Jan 2023  Tobacco Use   Smoking status: Never   Smokeless tobacco: Never  Vaping Use   Vaping Use: Never used  Substance and Sexual Activity   Alcohol use: Not Currently    Comment: occ- 0-1 drinks per week; maybe 1 drink every 6 months   Drug use: No   Sexual activity: Yes    Birth control/protection: Condom  Other Topics Concern   Not on file  Social History Narrative   Has 2 step-daughters   Social Determinants of Health   Financial Resource Strain: Not on file  Food Insecurity: No Food Insecurity   Worried About Charity fundraiser in the Last Year: Never true   Stella in the Last Year: Never true  Transportation Needs: No Transportation Needs   Lack of Transportation (Medical): No   Lack of Transportation (Non-Medical): No  Physical Activity: Not on file  Stress: Not on file  Social Connections: Socially Integrated   Frequency of Communication with Friends and Family: More than three times a week   Frequency of Social Gatherings with Friends and Family: More than three times a week   Attends Religious Services: More than 4 times per year   Active Member of Clubs or Organizations: Yes  Attends Archivist Meetings: Never   Marital Status: Married  Human resources officer Violence: Not on file    FAMILY HISTORY:  Family History  Problem Relation Age of Onset   Hypertension Mother    Diabetes Mother    Hypertension Father    Diabetes Maternal Grandmother     CURRENT MEDICATIONS:  Current Outpatient Medications  Medication Sig Dispense Refill   ACCU-CHEK GUIDE test strip      Accu-Chek Softclix Lancets lancets 1 each 3 (three) times daily.     acyclovir (ZOVIRAX) 800 MG tablet Take by mouth.     amLODipine (NORVASC) 10 MG tablet Take 1 tablet (10 mg total) by mouth daily. 30 tablet 1    aspirin 325 MG tablet Take 325 mg by mouth daily.     Blood Glucose Monitoring Suppl (ACCU-CHEK GUIDE) w/Device KIT      calcium carbonate (OS-CAL) 1250 (500 Ca) MG chewable tablet Chew 1 tablet by mouth daily.     cholecalciferol (VITAMIN D3) 25 MCG (1000 UNIT) tablet Take 1,000 Units by mouth daily.     folic acid (FOLVITE) 1 MG tablet Take 1 mg by mouth daily.     LANTUS SOLOSTAR 100 UNIT/ML Solostar Pen Inject 30 Units into the skin at bedtime. 15 mL 0   lenalidomide (REVLIMID) 10 MG capsule Take 1 capsule daily for 21 days, then take 7 days off 21 capsule 0   metoprolol tartrate (LOPRESSOR) 25 MG tablet Take 1 tablet (25 mg total) by mouth daily. 30 tablet 1   Multiple Vitamin (MULTIVITAMIN) tablet Take 1 tablet by mouth daily.     ondansetron (ZOFRAN) 8 MG tablet Take by mouth.     rosuvastatin (CRESTOR) 20 MG tablet Take 1 tablet (20 mg total) by mouth daily. 90 tablet 3   sulfamethoxazole-trimethoprim (BACTRIM DS) 800-160 MG tablet Take 1 tablet by mouth 3 (three) times a week.     VITAMIN D PO Take by mouth.     No current facility-administered medications for this visit.    ALLERGIES:  No Known Allergies  PHYSICAL EXAM:  Performance status (ECOG): 1 - Symptomatic but completely ambulatory  Vitals:   03/16/21 0907  BP: (!) 136/105  Pulse: 93  Resp: 18  Temp: (!) 97.4 F (36.3 C)  SpO2: 100%   Wt Readings from Last 3 Encounters:  03/16/21 177 lb 6.4 oz (80.5 kg)  03/01/21 173 lb 12.8 oz (78.8 kg)  02/18/21 173 lb (78.5 kg)   Physical Exam Vitals reviewed.  Constitutional:      Appearance: Normal appearance.  Cardiovascular:     Rate and Rhythm: Normal rate and regular rhythm.     Pulses: Normal pulses.     Heart sounds: Normal heart sounds.  Pulmonary:     Effort: Pulmonary effort is normal.     Breath sounds: Normal breath sounds.  Neurological:     General: No focal deficit present.     Mental Status: She is alert and oriented to person, place, and time.   Psychiatric:        Mood and Affect: Mood normal.        Behavior: Behavior normal.     LABORATORY DATA:  I have reviewed the labs as listed.  CBC Latest Ref Rng & Units 03/16/2021 03/01/2021 02/18/2021  WBC 4.0 - 10.5 K/uL 4.5 3.0(L) 4.3  Hemoglobin 12.0 - 15.0 g/dL 13.0 12.8 13.4  Hematocrit 36.0 - 46.0 % 40.2 39.2 40.3  Platelets 150 - 400 K/uL 274  281 301   CMP Latest Ref Rng & Units 03/01/2021 02/18/2021 02/01/2021  Glucose 70 - 99 mg/dL 191(H) 143(H) 146(H)  BUN 6 - 20 mg/dL 12 11 13   Creatinine 0.44 - 1.00 mg/dL 0.62 0.61 0.74  Sodium 135 - 145 mmol/L 136 138 136  Potassium 3.5 - 5.1 mmol/L 3.5 4.5 3.7  Chloride 98 - 111 mmol/L 105 100 102  CO2 22 - 32 mmol/L 24 19(L) 21(L)  Calcium 8.9 - 10.3 mg/dL 9.2 9.6 9.4  Total Protein 6.5 - 8.1 g/dL 7.6 7.7 7.7  Total Bilirubin 0.3 - 1.2 mg/dL 0.7 0.3 0.4  Alkaline Phos 38 - 126 U/L 77 93 84  AST 15 - 41 U/L 31 23 24   ALT 0 - 44 U/L 49(H) 39(H) 38    DIAGNOSTIC IMAGING:  I have independently reviewed the scans and discussed with the patient. No results found.   ASSESSMENT:  1.  Stage III IgA kappa multiple myeloma: -Presentation to [facility] in [place] with confusion and weakness. -Found to have diabetic ketoacidosis with newly diagnosed type 1 diabetes. -Also found to have acute renal failure and hypercalcemia. -Bone marrow biopsy on 02/25/2020 with flow cytometry with the population CD138 positive, CD56 positive IgA kappa monoclonal plasma cells 12%.  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 -Ultrasound abdomen on 02/23/2020 showed increased hepatic echogenicity with normal spleen. -Renal ultrasound on 02/24/2020 was normal. -CT chest on 02/22/2020 with moth eaten appearance of the bones. -Beta-2 microglobulin 2.1.  LDH 322.  SPEP and immunofixation were normal.  Kappa light chains  are elevated at 36.8.  Lambda light chains 12.9 and ratio of 2.85. -24-hour urine immunofixation was negative.  M spike was negative. -PET scan on 03/23/2020 showed no FDG radiotracer activity, no soft tissue plasmacytoma.  Diffuse multiple small lytic lesions within the pelvis, spine and vertebral bodies consistent with myeloma. -Chromosome analysis was 46, XX.  Multiple myeloma FISH panel was normal. -She is considered high risk based on elevated LDH level. -4 cycles of Dara VRD from 03/31/2020 through 06/12/2020. 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 - BMBX on 12/15/2020 with variably normocellular 30 to 70% with trilineage hematopoiesis.  No increase in plasma cells.  Myeloma FISH panel was normal.  Chromosome analysis was normal. - Maintenance Revlimid 10 mg 3 weeks on/1 week off started around 12/08/2020.   2.  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  Non-smoker. -No family history of myeloma or other malignancies.   PLAN:  1.  Stage III IgA kappa multiple myeloma: - MRD results from [facility] was negative on recent bone marrow biopsy. - Reviewed myeloma panel from 03/01/2021.  M spike is negative.  Free light chain ratio is normal.  Immunofixation was normal. - Reviewed labs from today which shows normal CBC and LFTs.  Renal function was normal. - Continue Revlimid 10 mg 3 weeks on 1 week off. - We will give her flu shot today.  We will get her vaccination schedule and get it started.  She was told to take COVID-vaccines starting with the first vaccine at the nearest pharmacy. - RTC 8 weeks for follow-up with repeat labs.   2.  Type I diabetes: - Continue Lantus 30 units at bedtime.   3.  Hypertension: - Continue Norvasc and metoprolol.  She has slightly elevated blood pressure today.  She is awaiting amlodipine shipment and has  not taken it for the last 1 to 2 days.   4.  Bone protection:  - Calcium today is 9.4.  Continue calcium  supplements. - Continue Zometa monthly.   Orders placed this encounter:  No orders of the defined types were placed in this encounter.    [name], MD [place] [phone]   I, Thana Ates, am acting as a scribe for Dr. [name].  I, [name] MD, have reviewed the above documentation for accuracy and completeness, and I agree with the above.

## 2021-03-16 ENCOUNTER — Other Ambulatory Visit: Payer: Self-pay

## 2021-03-16 ENCOUNTER — Other Ambulatory Visit: Payer: Self-pay | Admitting: *Deleted

## 2021-03-16 ENCOUNTER — Telehealth: Payer: Self-pay | Admitting: Nurse Practitioner

## 2021-03-16 ENCOUNTER — Inpatient Hospital Stay (HOSPITAL_COMMUNITY): Payer: Medicaid Other | Attending: Hematology

## 2021-03-16 ENCOUNTER — Inpatient Hospital Stay (HOSPITAL_BASED_OUTPATIENT_CLINIC_OR_DEPARTMENT_OTHER): Payer: Medicaid Other | Admitting: Hematology

## 2021-03-16 VITALS — BP 136/105 | HR 93 | Temp 97.4°F | Resp 18 | Wt 177.4 lb

## 2021-03-16 DIAGNOSIS — I10 Essential (primary) hypertension: Secondary | ICD-10-CM | POA: Diagnosis not present

## 2021-03-16 DIAGNOSIS — E109 Type 1 diabetes mellitus without complications: Secondary | ICD-10-CM | POA: Diagnosis not present

## 2021-03-16 DIAGNOSIS — C9000 Multiple myeloma not having achieved remission: Secondary | ICD-10-CM

## 2021-03-16 DIAGNOSIS — Z419 Encounter for procedure for purposes other than remedying health state, unspecified: Secondary | ICD-10-CM | POA: Diagnosis not present

## 2021-03-16 DIAGNOSIS — Z23 Encounter for immunization: Secondary | ICD-10-CM

## 2021-03-16 LAB — CBC WITH DIFFERENTIAL/PLATELET
Abs Immature Granulocytes: 0.03 10*3/uL (ref 0.00–0.07)
Basophils Absolute: 0.1 10*3/uL (ref 0.0–0.1)
Basophils Relative: 1 %
Eosinophils Absolute: 0.4 10*3/uL (ref 0.0–0.5)
Eosinophils Relative: 8 %
HCT: 40.2 % (ref 36.0–46.0)
Hemoglobin: 13 g/dL (ref 12.0–15.0)
Immature Granulocytes: 1 %
Lymphocytes Relative: 30 %
Lymphs Abs: 1.4 10*3/uL (ref 0.7–4.0)
MCH: 27.7 pg (ref 26.0–34.0)
MCHC: 32.3 g/dL (ref 30.0–36.0)
MCV: 85.7 fL (ref 80.0–100.0)
Monocytes Absolute: 0.4 10*3/uL (ref 0.1–1.0)
Monocytes Relative: 8 %
Neutro Abs: 2.4 10*3/uL (ref 1.7–7.7)
Neutrophils Relative %: 52 %
Platelets: 274 10*3/uL (ref 150–400)
RBC: 4.69 MIL/uL (ref 3.87–5.11)
RDW: 13.6 % (ref 11.5–15.5)
WBC: 4.5 10*3/uL (ref 4.0–10.5)
nRBC: 0 % (ref 0.0–0.2)

## 2021-03-16 LAB — COMPREHENSIVE METABOLIC PANEL
ALT: 35 U/L (ref 0–44)
AST: 26 U/L (ref 15–41)
Albumin: 4.6 g/dL (ref 3.5–5.0)
Alkaline Phosphatase: 74 U/L (ref 38–126)
Anion gap: 10 (ref 5–15)
BUN: 12 mg/dL (ref 6–20)
CO2: 25 mmol/L (ref 22–32)
Calcium: 9.4 mg/dL (ref 8.9–10.3)
Chloride: 103 mmol/L (ref 98–111)
Creatinine, Ser: 0.6 mg/dL (ref 0.44–1.00)
GFR, Estimated: >60 mL/min (ref >60)
Glucose, Bld: 164 mg/dL — ABNORMAL HIGH (ref 70–99)
Potassium: 3.6 mmol/L (ref 3.5–5.1)
Sodium: 138 mmol/L (ref 135–145)
Total Bilirubin: 0.5 mg/dL (ref 0.3–1.2)
Total Protein: 7.8 g/dL (ref 6.5–8.1)

## 2021-03-16 LAB — MAGNESIUM: Magnesium: 1.9 mg/dL (ref 1.7–2.4)

## 2021-03-16 MED ORDER — INFLUENZA VAC SPLIT QUAD 0.5 ML IM SUSY
0.5000 mL | PREFILLED_SYRINGE | Freq: Once | INTRAMUSCULAR | Status: AC
  Administered 2021-03-16: 0.5 mL via INTRAMUSCULAR
  Filled 2021-03-16: qty 0.5

## 2021-03-16 MED ORDER — AMLODIPINE BESYLATE 10 MG PO TABS
10.0000 mg | ORAL_TABLET | Freq: Every day | ORAL | 1 refills | Status: DC
Start: 2021-03-16 — End: 2021-07-14

## 2021-03-16 NOTE — Telephone Encounter (Signed)
Rx was sent in. 

## 2021-03-16 NOTE — Telephone Encounter (Signed)
Pt called in for refills on Amlodipine

## 2021-03-16 NOTE — Progress Notes (Signed)
Patient in today for office visit. Flu vaccine ordered. See MAR for administration information. Patient remained stable throughout injection. Patient discharged from clinic ambulatory and in stable condition.

## 2021-03-16 NOTE — Patient Instructions (Signed)
[place] at [facility] Discharge Instructions  You were seen and examined today by Dr. Delton Coombes. He reviewed your most recent labs and everything looks okay. He wants you to continue taking the Revlimid as prescribed. Please keep follow up as scheduled.   Thank you for choosing [place] at [facility] to provide your oncology and hematology care.  To afford each patient quality time with our provider, please arrive at least 15 minutes before your scheduled appointment time.   If you have a lab appointment with the [facility] please come in thru the Main Entrance and check in at the main information desk.  You need to re-schedule your appointment should you arrive 10 or more minutes late.  We strive to give you quality time with our providers, and arriving late affects you and other patients whose appointments are after yours.  Also, if you no show three or more times for appointments you may be dismissed from the clinic at the providers discretion.     Again, thank you for choosing [facility].  Our hope is that these requests will decrease the amount of time that you wait before being seen by our physicians.       _____________________________________________________________  Should you have questions after your visit to [facility], please contact our office at [phone] and follow the prompts.  Our office hours are 8:00 a.m. and 4:30 p.m. Monday - Friday.  Please note that voicemails left after 4:00 p.m. may not be returned until the following business day.  We are closed weekends and major holidays.  You do have access to a nurse 24-7, just call the main number to the clinic [phone] and do not press any options, hold on the line and a nurse will answer the phone.    For prescription refill requests, have your pharmacy contact our office and allow 72 hours.    Due to Covid, you will need to wear a mask  upon entering the hospital. If you do not have a mask, a mask will be given to you at the Main Entrance upon arrival. For doctor visits, patients may have 1 support person age 35 or older with them. For treatment visits, patients can not have anyone with them due to social distancing guidelines and our immunocompromised population.

## 2021-03-17 ENCOUNTER — Encounter (HOSPITAL_COMMUNITY): Payer: Self-pay | Admitting: Hematology and Oncology

## 2021-03-22 ENCOUNTER — Telehealth: Payer: Self-pay | Admitting: Nurse Practitioner

## 2021-03-22 ENCOUNTER — Other Ambulatory Visit: Payer: Self-pay | Admitting: *Deleted

## 2021-03-22 ENCOUNTER — Other Ambulatory Visit (HOSPITAL_COMMUNITY): Payer: Medicaid Other

## 2021-03-22 ENCOUNTER — Ambulatory Visit (HOSPITAL_COMMUNITY): Payer: Medicaid Other

## 2021-03-22 MED ORDER — LANTUS SOLOSTAR 100 UNIT/ML ~~LOC~~ SOPN: 30.0000 [IU] | PEN_INJECTOR | Freq: Every evening | SUBCUTANEOUS | 0 refills | Status: DC

## 2021-03-22 NOTE — Telephone Encounter (Signed)
Rx was sent into pharmacy.  

## 2021-03-22 NOTE — Telephone Encounter (Signed)
Please send in Insulin Pen to [facility]

## 2021-03-24 ENCOUNTER — Other Ambulatory Visit (HOSPITAL_COMMUNITY): Payer: Self-pay

## 2021-03-24 ENCOUNTER — Other Ambulatory Visit: Payer: Self-pay

## 2021-03-24 ENCOUNTER — Inpatient Hospital Stay (HOSPITAL_COMMUNITY): Payer: Medicaid Other

## 2021-03-24 VITALS — BP 123/93 | HR 84 | Temp 97.5°F | Resp 18

## 2021-03-24 DIAGNOSIS — C9000 Multiple myeloma not having achieved remission: Secondary | ICD-10-CM

## 2021-03-24 DIAGNOSIS — I10 Essential (primary) hypertension: Secondary | ICD-10-CM | POA: Diagnosis not present

## 2021-03-24 DIAGNOSIS — E109 Type 1 diabetes mellitus without complications: Secondary | ICD-10-CM | POA: Diagnosis not present

## 2021-03-24 DIAGNOSIS — Z23 Encounter for immunization: Secondary | ICD-10-CM | POA: Diagnosis not present

## 2021-03-24 LAB — PREGNANCY, URINE: Preg Test, Ur: NEGATIVE

## 2021-03-24 MED ORDER — HEPATITIS B VAC RECOMBINANT 20 MCG/ML IJ SUSP
2.0000 mL | Freq: Once | INTRAMUSCULAR | Status: AC
  Administered 2021-03-24: 40 ug via INTRAMUSCULAR
  Filled 2021-03-24: qty 2

## 2021-03-24 MED ORDER — DTAP-IPV-HIB VACCINE IM SUSR
0.5000 mL | Freq: Once | INTRAMUSCULAR | Status: AC
  Administered 2021-03-24: 0.5 mL via INTRAMUSCULAR
  Filled 2021-03-24: qty 1

## 2021-03-24 MED ORDER — PNEUMOCOCCAL 13-VAL CONJ VACC IM SUSP
0.5000 mL | Freq: Once | INTRAMUSCULAR | Status: AC
Start: 2021-03-24 — End: 2021-03-24
  Administered 2021-03-24: 0.5 mL via INTRAMUSCULAR
  Filled 2021-03-24: qty 0.5

## 2021-03-24 NOTE — Progress Notes (Signed)
Patient presents today for Energix B, Pentacel, and Prevnar 13 injections.  Patient is in satisfactory condition with no complaints voiced.  Vital signs are stable.  We will proceed with injections as ordered by MD.    Patient tolerated injections with no complaints voiced.  Site clean and dry with no bruising or swelling noted.  No complaints of pain.  Discharged with vital signs stable and no signs or symptoms of distress noted.

## 2021-03-24 NOTE — Progress Notes (Signed)
Orders received today for patient to receive the following immunizations:  Prevnar 13 IM x 1 Pentacel  IM x 1 Engerix B 40 IM mcg x 1  Orders entered in signed and held per MD.  T.O. Dr Rhys Martini, PharmD 03/24/21 @ 1053

## 2021-03-24 NOTE — Progress Notes (Signed)
Standing order placed for urine pregnancy test every 3 weeks

## 2021-03-24 NOTE — Patient Instructions (Signed)
[facility]  Discharge Instructions: Thank you for choosing [facility] to provide your oncology and hematology care.  If you have a lab appointment with the Cancer Center, please come in thru the Main Entrance and check in at the main information desk.  Wear comfortable clothing and clothing appropriate for easy access to any Portacath or PICC line.   We strive to give you quality time with your provider. You may need to reschedule your appointment if you arrive late (15 or more minutes).  Arriving late affects you and other patients whose appointments are after yours.  Also, if you miss three or more appointments without notifying the office, you may be dismissed from the clinic at the provider's discretion.      For prescription refill requests, have your pharmacy contact our office and allow 72 hours for refills to be completed.        To help prevent nausea and vomiting after your treatment, we encourage you to take your nausea medication as directed.  BELOW ARE SYMPTOMS THAT SHOULD BE REPORTED IMMEDIATELY: *FEVER GREATER THAN 100.4 F (38 C) OR HIGHER *CHILLS OR SWEATING *NAUSEA AND VOMITING THAT IS NOT CONTROLLED WITH YOUR NAUSEA MEDICATION *UNUSUAL SHORTNESS OF BREATH *UNUSUAL BRUISING OR BLEEDING *URINARY PROBLEMS (pain or burning when urinating, or frequent urination) *BOWEL PROBLEMS (unusual diarrhea, constipation, pain near the anus) TENDERNESS IN MOUTH AND THROAT WITH OR WITHOUT PRESENCE OF ULCERS (sore throat, sores in mouth, or a toothache) UNUSUAL RASH, SWELLING OR PAIN  UNUSUAL VAGINAL DISCHARGE OR ITCHING   Items with * indicate a potential emergency and should be followed up as soon as possible or go to the Emergency Department if any problems should occur.  Please show the CHEMOTHERAPY ALERT CARD or IMMUNOTHERAPY ALERT CARD at check-in to the Emergency Department and triage nurse.  Should you have questions after your visit or need to cancel  or reschedule your appointment, please contact [facility] [phone]  and follow the prompts.  Office hours are 8:00 a.m. to 4:30 p.m. Monday - Friday. Please note that voicemails left after 4:00 p.m. may not be returned until the following business day.  We are closed weekends and major holidays. You have access to a nurse at all times for urgent questions. Please call the main number to the clinic [phone] and follow the prompts.  For any non-urgent questions, you may also contact your provider using MyChart. We now offer e-Visits for anyone 18 and older to request care online for non-urgent symptoms. For details visit [email or web address].   Also download the MyChart app! Go to the app store, search "MyChart", open the app, select [facility], and log in with your MyChart username and password.  Due to Covid, a mask is required upon entering the hospital/clinic. If you do not have a mask, one will be given to you upon arrival. For doctor visits, patients may have 1 support person aged 18 or older with them. For treatment visits, patients cannot have anyone with them due to current Covid guidelines and our immunocompromised population.  

## 2021-03-25 ENCOUNTER — Other Ambulatory Visit (HOSPITAL_COMMUNITY): Payer: Self-pay

## 2021-03-25 MED ORDER — LENALIDOMIDE 10 MG PO CAPS: ORAL_CAPSULE | ORAL | 0 refills | Status: DC

## 2021-03-25 NOTE — Telephone Encounter (Signed)
Chart reviewed. Revlimid refilled per last office note with Dr. Katragadda.  

## 2021-04-07 ENCOUNTER — Other Ambulatory Visit: Payer: Self-pay

## 2021-04-07 ENCOUNTER — Other Ambulatory Visit: Payer: Self-pay | Admitting: *Deleted

## 2021-04-07 NOTE — Patient Outreach (Signed)
Medicaid Managed Care   Nurse Care Manager Note  04/07/2021 Name:  Diamond Robbins MRN:  [id number] DOB:  1990/11/08  Diamond Robbins is an 30 y.o. year old female who is Robbins primary patient of Diamond Larsson, NP.  The [facility] Managed Care Coordination team was consulted for assistance with:    HTN  Diamond Robbins was given information about Medicaid Managed Care Coordination team services today. Diamond Robbins Patient agreed to services and verbal consent obtained.  Engaged with patient by telephone for follow up visit in response to provider referral for case management and/or care coordination services.   Assessments/Interventions:  Review of past medical history, allergies, medications, health status, including review of consultants reports, laboratory and other test data, was performed as part of comprehensive evaluation and provision of chronic care management services.  SDOH (Social Determinants of Health) assessments and interventions performed: SDOH Interventions    Flowsheet Row Most Recent Value  SDOH Interventions   Food Insecurity Interventions Intervention Not Indicated  Housing Interventions Intervention Not Indicated  Transportation Interventions Intervention Not Indicated       Care Plan  No Known Allergies  Medications Reviewed Today     Reviewed by Diamond Montane, RN (Registered Nurse) on 04/07/21 at 1042  Med List Status: <None>   Medication Order Taking? Sig Documenting Provider Last Dose Status Informant  ACCU-CHEK GUIDE test strip [id number] Yes  [provider] Taking Active Self  Accu-Chek Softclix Lancets lancets [id number] Yes 1 each 3 (three) times daily. [provider] Taking Active Self  acyclovir (ZOVIRAX) 800 MG tablet [id number] Yes Take by mouth. [provider] Taking Active   amLODipine (NORVASC) 10 MG tablet [id number] Yes Take 1 tablet (10 mg total) by mouth daily. Diamond Larsson, NP Taking Active   aspirin 325 MG tablet  [id number] Yes Take 325 mg by mouth daily. [provider] Taking Active   Blood Glucose Monitoring Suppl (ACCU-CHEK GUIDE) w/Device KIT [id number] Yes  [provider] Taking Active Self  calcium carbonate (OS-CAL) 1250 (500 Ca) MG chewable tablet [id number] Yes Chew 1 tablet by mouth daily. [provider] Taking Active   cholecalciferol (VITAMIN D3) 25 MCG (1000 UNIT) tablet [id number] Yes Take 1,000 Units by mouth daily. [provider] Taking Active   folic acid (FOLVITE) 1 MG tablet [id number] Yes Take 1 mg by mouth daily. [provider] Taking Active   LANTUS SOLOSTAR 100 UNIT/ML Solostar Pen [id number] Yes Inject 30 Units into the skin at bedtime. Diamond Larsson, NP Taking Active   lenalidomide (REVLIMID) 10 MG capsule [id number] No Take 1 capsule daily for 21 days, then take 7 days off  Patient not taking: Reported on 04/07/2021   Diamond Jack, MD Not Taking Active            Med Note (Diamond Robbins   Wed Apr 07, 2021 10:40 AM) Will start taking on 04/09/21  metoprolol tartrate (LOPRESSOR) 25 MG tablet [id number] Yes Take 1 tablet (25 mg total) by mouth daily. Diamond Jack, MD Taking Active   Multiple Vitamin (MULTIVITAMIN) tablet [id number] Yes Take 1 tablet by mouth daily. [provider] Taking Active   ondansetron (ZOFRAN) 8 MG tablet [id number] Yes Take by mouth. [provider] Taking Active   rosuvastatin (CRESTOR) 20 MG tablet [id number] Yes Take 1 tablet (20 mg total) by mouth daily. Diamond Larsson, NP Taking Active   sulfamethoxazole-trimethoprim (BACTRIM DS) 800-160 MG tablet [id number] Yes Take 1 tablet by mouth 3 (three)  times Robbins week. [provider] Taking Active   VITAMIN D PO [id number] Yes Take by mouth. [provider] Taking Active   Med List Note Diamond Robbins, CPhT 03/07/13 1442): No preferred pharmacy.             Patient Active Problem List   Diagnosis Date Noted    Pap smear for cervical cancer screening 02/18/2021   T1DM (type 1 diabetes mellitus) ([place]) 06/11/2020   Multiple myeloma ([place]) 03/09/2020   Encounter for general adult medical examination with abnormal findings 03/09/2020   Chronic hypertension 05/24/2019    Conditions to be addressed/monitored per PCP order:  HTN  Care Plan : Hypertension (Adult)  Updates made by Diamond Montane, RN since 04/07/2021 12:00 AM     Problem: Hypertension (Hypertension)      Long-Range Goal: Hypertension Monitored   Start Date: 12/08/2020  Expected End Date: 04/07/2021  This Visit's Progress: On track  Recent Progress: On track  Priority: High  Note:   Objective:  Last practice recorded BP readings:  BP Readings from Last 3 Encounters:  03/24/21 (!) 123/93  03/16/21 (!) 136/105  03/01/21 (!) 119/92   Most recent eGFR/CrCl:  Lab Results  Component Value Date   EGFR 123 02/18/2021    No components found for: CRCL Current Barriers:  Knowledge Deficits related to understanding of medications prescribed for management of hypertension-Met Unable to independently establish new PCP Case Manager Clinical Goal(s):  patient will verbalize understanding of plan for hypertension management-Met patient will attend all scheduled medical appointments and call for new PCP appointment-Met patient will demonstrate improved adherence to prescribed treatment plan for hypertension as evidenced by taking all medications as prescribed, monitoring and recording blood pressure as directed, adhering to low sodium/DASH diet-Met Interventions:  Evaluation of current treatment plan related to hypertension self management and patient's adherence to plan as established by provider. Encouraged patient to increase exercise to 150 minutes Robbins week Discussed medications, encouraged to notify Oncologist of Revlimid issue(patient forgot to take with her while visiting her mother in [place]) Discussed plans with patient for ongoing  care management follow up and provided patient with direct contact information for care management team Advised patient, providing education and rationale, to monitor blood pressure daily and record, calling PCP for findings outside established parameters.  Reviewed scheduled/upcoming provider appointments including: lab appointments and Oncology on 05/25/21, 06/21/21 with PCP Patient Goals/Self-Care Activities:  - take your BP medication before provider appointments - take all medications with you to your appointments - request Robbins BP cuff at your PCP appointment  - check blood pressure weekly - choose Robbins place to take my blood pressure (home, clinic or office, retail store) - write blood pressure results in Robbins log or diary - take all medications as directed  - adhere to dash diet -increase exercise to 30 min/day for 5 days Robbins week RNCM will follow up on 06/08/21 @ 10:30am      Follow Up:  Patient agrees to Care Plan and Follow-up.  Plan: The Managed Medicaid care management team will reach out to the patient again over the next 60 days.  Date/time of next scheduled RN care management/care coordination outreach:  06/08/21 _0 :30am  Lurena Joiner RN, BSN [place] RN Care Coordinator

## 2021-04-07 NOTE — Patient Instructions (Signed)
Visit Information  Diamond Robbins was given information about Medicaid Managed Care team care coordination services as a part of their [facility] Medicaid benefit. Diamond Robbins verbally consented to engagement with the [facility] Managed Care team.   If you are experiencing a medical emergency, please call 911 or report to your local emergency department or urgent care.   If you have a non-emergency medical problem during routine business hours, please contact your provider's office and ask to speak with a nurse.   For questions related to your [facility] health plan, please call: [phone] or go here:[email or web address]  If you would like to schedule transportation through your [facility] plan, please call the following number at least 2 days in advance of your appointment: [phone].  Call the [place] at [phone], at any time, 24 hours a day, 7 days a week. If you are in danger or need immediate medical attention call 911.  If you would like help to quit smoking, call 1-800-QUIT-NOW 782-395-9696) OR Espaol: 1-855-Djelo-Ya (0-263-785-8850) o para ms informacin haga clic aqu or Text READY to 200-400 to register via text  Diamond Robbins - following are the goals we discussed in your visit today:   Goals Addressed             This Visit's Progress    COMPLETED: Track and Manage My Blood Pressure-Hypertension       Timeframe:  Long-Range Goal Priority:  High Start Date:   12/08/20                          Expected End Date: 04/07/21                      Follow Up Date 04/07/21    - take your BP medication before provider appointments - take all medications with you to your appointment - request a BP cuff at your PCP appointment  - check blood pressure weekly - choose a place to take my blood pressure (home, clinic or office, retail store) - write blood pressure results in a log or diary - take all medications as directed  -  adhere to dash diet   Why is this important?   You won't feel high blood pressure, but it can still hurt your blood vessels.  High blood pressure can cause heart or kidney problems. It can also cause a stroke.  Making lifestyle changes like losing a little weight or eating less salt will help.  Checking your blood pressure at home and at different times of the day can help to control blood pressure.  If the doctor prescribes medicine remember to take it the way the doctor ordered.  Call the office if you cannot afford the medicine or if there are questions about it.             Please see education materials related to hypertension provided by MyChart link.  Patient has access to MyChart and can view provided education  Telephone follow up appointment with Managed Medicaid care management team member scheduled for:06/08/20 @ 10:30am  Diamond Joiner RN, BSN [place] RN Care Coordinator   Following is a copy of your plan of care:  Care Plan : Hypertension (Adult)  Updates made by Melissa Montane, RN since 04/07/2021 12:00 AM     Problem: Hypertension (Hypertension)      Long-Range Goal: Hypertension Monitored   Start Date: 12/08/2020  Expected End Date: 04/07/2021  This Visit's Progress: On track  Recent Progress: On track  Priority: High  Note:   Objective:  Last practice recorded BP readings:  BP Readings from Last 3 Encounters:  03/24/21 (!) 123/93  03/16/21 (!) 136/105  03/01/21 (!) 119/92   Most recent eGFR/CrCl:  Lab Results  Component Value Date   EGFR 123 02/18/2021    No components found for: CRCL Current Barriers:  Knowledge Deficits related to understanding of medications prescribed for management of hypertension-Met Unable to independently establish new PCP Case Manager Clinical Goal(s):  patient will verbalize understanding of plan for hypertension management-Met patient will attend all scheduled medical appointments and  call for new PCP appointment-Met patient will demonstrate improved adherence to prescribed treatment plan for hypertension as evidenced by taking all medications as prescribed, monitoring and recording blood pressure as directed, adhering to low sodium/DASH diet-Met Interventions:  Evaluation of current treatment plan related to hypertension self management and patient's adherence to plan as established by provider. Encouraged patient to increase exercise to 150 minutes a week Discussed medications, encouraged to notify Oncologist of Revlimid issue(patient forgot to take with her while visiting her mother in [place]) Discussed plans with patient for ongoing care management follow up and provided patient with direct contact information for care management team Advised patient, providing education and rationale, to monitor blood pressure daily and record, calling PCP for findings outside established parameters.  Reviewed scheduled/upcoming provider appointments including: lab appointments and Oncology on 05/25/21, 06/21/21 with PCP Patient Goals/Self-Care Activities:  - take your BP medication before provider appointments - take all medications with you to your appointments - request a BP cuff at your PCP appointment  - check blood pressure weekly - choose a place to take my blood pressure (home, clinic or office, retail store) - write blood pressure results in a log or diary - take all medications as directed  - adhere to dash diet -increase exercise to 30 min/day for 5 days a week RNCM will follow up on 06/08/21 @ 10:30am

## 2021-04-12 ENCOUNTER — Other Ambulatory Visit: Payer: Self-pay

## 2021-04-12 NOTE — Patient Instructions (Signed)
Visit Information  Diamond Robbins was given information about Medicaid Managed Care team care coordination services as a part of their [facility] Medicaid benefit. Diamond Robbins verbally consented to engagement with the [facility] Managed Care team.   If you are experiencing a medical emergency, please call 911 or report to your local emergency department or urgent care.   If you have a non-emergency medical problem during routine business hours, please contact your provider's office and ask to speak with a nurse.   For questions related to your [facility] health plan, please call: [phone] or go here:[email or web address]  If you would like to schedule transportation through your [facility] plan, please call the following number at least 2 days in advance of your appointment: [phone].  Call the [place] at [phone], at any time, 24 hours a day, 7 days a week. If you are in danger or need immediate medical attention call 911.  If you would like help to quit smoking, call 1-800-QUIT-NOW (608)149-2631) OR Espaol: 1-855-Djelo-Ya (8-546-270-3500) o para ms informacin haga clic aqu or Text READY to 200-400 to register via text  Diamond Robbins - following are the goals we discussed in your visit today:   Goals Addressed   None      The  Patient                                              has been provided with contact information for the Managed Medicaid care management team and has been advised to call with any health related questions or concerns.   [name], BSW, [place] Managed Medicaid Team  [phone]   Following is a copy of your plan of care:  There are no care plans that you recently modified to display for this patient.

## 2021-04-12 NOTE — Patient Outreach (Signed)
Medicaid Managed Care Social Work Note  04/12/2021 Name:  Diamond Robbins MRN:  [id number] DOB:  Sep 22, 1990  Diamond Robbins is an 30 y.o. year old female who is a primary patient of Diamond Larsson, NP.  The [facility] Managed Care Coordination team was consulted for assistance with:   Eye exam resources  [name] was given information about Medicaid Managed Care Coordination team services today. [name] Patient agreed to services and verbal consent obtained.  Engaged with patient  for by telephone forfollow up visit in response to referral for case management and/or care coordination services.   Assessments/Interventions:  Review of past medical history, allergies, medications, health status, including review of consultants reports, laboratory and other test data, was performed as part of comprehensive evaluation and provision of chronic care management services.  SDOH: (Social Determinant of Health) assessments and interventions performed: BSW completed a follow up telephone call with patient. She states she has not tried the resources provided due to being out of town for the holiday, but will give them a call. No other resources needed at this time.   Advanced Directives Status:  Not addressed in this encounter.  Care Plan                 No Known Allergies  Medications Reviewed Today     Reviewed by Melissa Montane, RN (Registered Nurse) on 04/07/21 at 1042  Med List Status: <None>   Medication Order Taking? Sig Documenting Provider Last Dose Status Informant  ACCU-CHEK GUIDE test strip [id number] Yes  [provider] Taking Active Self  Accu-Chek Softclix Lancets lancets [id number] Yes 1 each 3 (three) times daily. [provider] Taking Active Self  acyclovir (ZOVIRAX) 800 MG tablet [id number] Yes Take by mouth. [provider] Taking Active   amLODipine (NORVASC) 10 MG tablet [id number] Yes Take 1 tablet (10 mg total) by mouth daily. Diamond Larsson,  NP Taking Active   aspirin 325 MG tablet [id number] Yes Take 325 mg by mouth daily. [provider] Taking Active   Blood Glucose Monitoring Suppl (ACCU-CHEK GUIDE) w/Device KIT [id number] Yes  [provider] Taking Active Self  calcium carbonate (OS-CAL) 1250 (500 Ca) MG chewable tablet [id number] Yes Chew 1 tablet by mouth daily. [provider] Taking Active   cholecalciferol (VITAMIN D3) 25 MCG (1000 UNIT) tablet [id number] Yes Take 1,000 Units by mouth daily. [provider] Taking Active   folic acid (FOLVITE) 1 MG tablet [id number] Yes Take 1 mg by mouth daily. [provider] Taking Active   LANTUS SOLOSTAR 100 UNIT/ML Solostar Pen [id number] Yes Inject 30 Units into the skin at bedtime. Diamond Larsson, NP Taking Active   lenalidomide (REVLIMID) 10 MG capsule [id number] No Take 1 capsule daily for 21 days, then take 7 days off  Patient not taking: Reported on 04/07/2021   [name], MD Not Taking Active            Med Note (ROBB, MELANIE A   Wed Apr 07, 2021 10:40 AM) Will start taking on 04/09/21  metoprolol tartrate (LOPRESSOR) 25 MG tablet [id number] Yes Take 1 tablet (25 mg total) by mouth daily. [name], MD Taking Active   Multiple Vitamin (MULTIVITAMIN) tablet [id number] Yes Take 1 tablet by mouth daily. [provider] Taking Active   ondansetron (ZOFRAN) 8 MG tablet [id number] Yes Take by mouth. [provider] Taking Active   rosuvastatin (CRESTOR) 20 MG tablet [id number] Yes Take 1 tablet (20 mg total)  by mouth daily. Diamond Larsson, NP Taking Active   sulfamethoxazole-trimethoprim (BACTRIM DS) 800-160 MG tablet [id number] Yes Take 1 tablet by mouth 3 (three) times a week. [provider] Taking Active   VITAMIN D PO [id number] Yes Take by mouth. [provider] Taking Active   Med List Note [name], CPhT 03/07/13 1442): No preferred pharmacy.             Patient  Active Problem List   Diagnosis Date Noted   Pap smear for cervical cancer screening 02/18/2021   T1DM (type 1 diabetes mellitus) ([place]) 06/11/2020   Multiple myeloma ([place]) 03/09/2020   Encounter for general adult medical examination with abnormal findings 03/09/2020   Chronic hypertension 05/24/2019    Conditions to be addressed/monitored per PCP order:   eye exam resources  There are no care plans that you recently modified to display for this patient.   Follow up:  Patient agrees to Care Plan and Follow-up.  Plan: The  Patient has been provided with contact information for the Managed Medicaid care management team and has been advised to call with any health related questions or concerns.    [name], BSW, [place] Managed Medicaid Team  [phone]

## 2021-04-14 ENCOUNTER — Inpatient Hospital Stay (HOSPITAL_COMMUNITY): Payer: Medicaid Other

## 2021-04-15 ENCOUNTER — Inpatient Hospital Stay (HOSPITAL_COMMUNITY): Payer: Medicaid Other | Attending: Hematology

## 2021-04-15 ENCOUNTER — Other Ambulatory Visit: Payer: Self-pay

## 2021-04-15 DIAGNOSIS — C9000 Multiple myeloma not having achieved remission: Secondary | ICD-10-CM | POA: Insufficient documentation

## 2021-04-15 DIAGNOSIS — Z419 Encounter for procedure for purposes other than remedying health state, unspecified: Secondary | ICD-10-CM | POA: Diagnosis not present

## 2021-04-15 LAB — PREGNANCY, URINE: Preg Test, Ur: NEGATIVE

## 2021-04-19 ENCOUNTER — Other Ambulatory Visit (HOSPITAL_COMMUNITY): Payer: Self-pay

## 2021-04-19 ENCOUNTER — Ambulatory Visit: Payer: Medicaid Other

## 2021-04-19 MED ORDER — LENALIDOMIDE 10 MG PO CAPS: ORAL_CAPSULE | ORAL | 0 refills | Status: DC

## 2021-04-19 NOTE — Telephone Encounter (Signed)
Chart reviewed. Revlimid refilled per last office note with Dr. Katragadda.  

## 2021-04-26 ENCOUNTER — Encounter (HOSPITAL_COMMUNITY): Payer: Self-pay

## 2021-04-26 DIAGNOSIS — C9000 Multiple myeloma not having achieved remission: Secondary | ICD-10-CM | POA: Diagnosis not present

## 2021-04-26 DIAGNOSIS — Z9484 Stem cells transplant status: Secondary | ICD-10-CM | POA: Diagnosis not present

## 2021-04-26 DIAGNOSIS — Z79899 Other long term (current) drug therapy: Secondary | ICD-10-CM | POA: Diagnosis not present

## 2021-04-26 DIAGNOSIS — Z7982 Long term (current) use of aspirin: Secondary | ICD-10-CM | POA: Diagnosis not present

## 2021-04-26 DIAGNOSIS — Z794 Long term (current) use of insulin: Secondary | ICD-10-CM | POA: Diagnosis not present

## 2021-04-26 DIAGNOSIS — Z48290 Encounter for aftercare following bone marrow transplant: Secondary | ICD-10-CM | POA: Diagnosis not present

## 2021-04-26 DIAGNOSIS — C9001 Multiple myeloma in remission: Secondary | ICD-10-CM | POA: Diagnosis not present

## 2021-04-26 NOTE — Progress Notes (Signed)
Negative pregnancy test received on 04/15/21 and Revlimid subsequently refilled on 04/19/21 per Dr. Delton Coombes. Revlimid not filled by pharmacy prior to pregnancy test expiration. I called the patient today to ensure revlimid compliance. Patient reports that this cycle started late due to being out of town. Patient has 9 pills remaining and will start next cycle on 05/12/21. Patient scheduled for pregnancy test on 05/05/21 and Revlimid refill will follow. Patient educated on importance of medication compliance and verbalized understanding. Dr. Delton Coombes made aware.

## 2021-05-05 ENCOUNTER — Other Ambulatory Visit: Payer: Self-pay

## 2021-05-05 ENCOUNTER — Other Ambulatory Visit (HOSPITAL_COMMUNITY): Payer: Self-pay

## 2021-05-05 ENCOUNTER — Inpatient Hospital Stay (HOSPITAL_COMMUNITY): Payer: Medicaid Other

## 2021-05-05 DIAGNOSIS — C9000 Multiple myeloma not having achieved remission: Secondary | ICD-10-CM

## 2021-05-05 LAB — PREGNANCY, URINE: Preg Test, Ur: NEGATIVE

## 2021-05-05 MED ORDER — LENALIDOMIDE 10 MG PO CAPS: ORAL_CAPSULE | ORAL | 0 refills | Status: DC

## 2021-05-05 NOTE — Telephone Encounter (Signed)
Chart reviewed. Revlimid refilled per last office note with Dr. Delton Coombes. Negative pregnancy test results faxed to [facility].

## 2021-05-11 ENCOUNTER — Other Ambulatory Visit: Payer: Self-pay

## 2021-05-12 ENCOUNTER — Telehealth: Payer: Self-pay | Admitting: Nurse Practitioner

## 2021-05-12 ENCOUNTER — Other Ambulatory Visit: Payer: Self-pay

## 2021-05-12 ENCOUNTER — Encounter (HOSPITAL_COMMUNITY): Payer: Self-pay

## 2021-05-12 MED ORDER — METOPROLOL TARTRATE 25 MG PO TABS: 25.0000 mg | ORAL_TABLET | Freq: Every day | ORAL | 1 refills | Status: DC

## 2021-05-12 NOTE — Telephone Encounter (Signed)
She doesn't get bactrim from [place]. She should get this from the prescriber on the bottle.  If she calls them and they won't will it, then I will need to know the diagnosis for it. She has seen oncology at [facility] and at [facility], so it could be from either of them.

## 2021-05-12 NOTE — Telephone Encounter (Signed)
Please send over Metoprolol and Bactrim DS

## 2021-05-12 NOTE — Telephone Encounter (Signed)
Patient aware.

## 2021-05-13 ENCOUNTER — Other Ambulatory Visit: Payer: Self-pay

## 2021-05-13 ENCOUNTER — Telehealth: Payer: Self-pay | Admitting: Nurse Practitioner

## 2021-05-13 MED ORDER — LANTUS SOLOSTAR 100 UNIT/ML ~~LOC~~ SOPN: 30.0000 [IU] | PEN_INJECTOR | Freq: Every evening | SUBCUTANEOUS | 0 refills | Status: DC

## 2021-05-13 NOTE — Telephone Encounter (Signed)
LANTUS SOLOSTAR 100 UNIT/ML Solostar Pen  Please call this in

## 2021-05-16 DIAGNOSIS — Z419 Encounter for procedure for purposes other than remedying health state, unspecified: Secondary | ICD-10-CM | POA: Diagnosis not present

## 2021-05-18 ENCOUNTER — Inpatient Hospital Stay (HOSPITAL_COMMUNITY): Payer: Medicaid Other | Attending: Hematology

## 2021-05-18 DIAGNOSIS — C9000 Multiple myeloma not having achieved remission: Secondary | ICD-10-CM | POA: Insufficient documentation

## 2021-05-18 DIAGNOSIS — E109 Type 1 diabetes mellitus without complications: Secondary | ICD-10-CM | POA: Insufficient documentation

## 2021-05-18 DIAGNOSIS — I10 Essential (primary) hypertension: Secondary | ICD-10-CM | POA: Insufficient documentation

## 2021-05-18 NOTE — Patient Instructions (Signed)
Visit Information  Diamond Robbins was given information about Medicaid Managed Care team care coordination services as a part of their [facility] Medicaid benefit. Diamond Robbins verbally consented to engagement with the [facility] Managed Care team.   If you are experiencing a medical emergency, please call 911 or report to your local emergency department or urgent care.   If you have a non-emergency medical problem during routine business hours, please contact your provider's office and ask to speak with a nurse.   For questions related to your [facility] health plan, please call: [phone] or go here:[email or web address]  If you would like to schedule transportation through your [facility] plan, please call the following number at least 2 days in advance of your appointment: [phone].  Call the [place] at [phone], at any time, 24 hours a day, 7 days a week. If you are in danger or need immediate medical attention call 911.  If you would like help to quit smoking, call 1-800-QUIT-NOW 9792803276) OR Espaol: 1-855-Djelo-Ya (6-834-196-2229) o para ms informacin haga clic aqu or Text READY to 200-400 to register via text  Diamond Robbins - following are the goals we discussed in your visit today:   Goals Addressed   None     The patient verbalized understanding of instructions provided today and declined a print copy of patient instruction materials.   RN Care Manager will call on 06/08/21 Pharmacist will call on [phone] Next PCP appointment:  06/21/21  Hughes Better PharmD, CPP High Risk Managed Medicaid [place] [phone]   Following is a copy of your plan of care:  Care Plan : General Pharmacy (Adult)  Updates made by Hughes Better, RPH-CPP since 05/18/2021 12:00 AM     Problem: Chronic Disease Management   Priority: High     Goal: Patient Stated   Start Date: 05/11/2021  Expected End Date: 08/16/2021  This  Visit's Progress: On track  Priority: High  Note:   Current Barriers:  Suboptimal therapeutic regimen for T1DM   Pharmacist Clinical Goal(s):  patient will adhere to plan to optimize therapeutic regimen for T1DM as evidenced by report of adherence to recommended medication management changes through collaboration with PharmD and provider.    Interventions: Inter-disciplinary care team collaboration (see longitudinal plan of care) Comprehensive medication review performed; medication list updated in electronic medical record  Diabetes:  Controlled; current treatment:Lantus 30 units once daily;   Current glucose readings: fasting glucose: 100-115, post prandial glucose: 115-130's  Denies hypoglycemic/hyperglycemic symptoms  Current meal patterns:  Breakfast: eggs, toast, bacon, grits Lunch: sandwich, chicken salad, or pizza Dinner: spaghetti or baked protein with veggies + potatoes Snacks: chocolate on occassion Drinks: water or diet soda on ocassion   Recommended PCP to obtain additional labs to verify T1DM versus T2DM to allow further optimization of regimen  Hypertension:  Uncontrolled; current treatment: amlodipine 10mg , metoprolol tartate 25mg ;   Current home readings: does not check at home  Denies hypotensive/hypertensive symptoms  Recommended patient obtain at home blood pressure monitor or check blood pressure when at local pharmacy or [employer]  Patient Goals/Self-Care Activities patient will:  - check glucose 2-3x/day, document, and provide at future appointments  Follow Up Plan:  Telephone follow up appointment with care management team member scheduled for: 06/08/21 Care Manager; 07/02/21 Pharmacist Next PCP appointment scheduled for: 06/21/21

## 2021-05-18 NOTE — Patient Outreach (Signed)
Medicaid Managed Care Pharmacy Note  05/18/2021 Name:  Diamond Robbins MRN:  [id number] DOB:  1990/09/18  Diamond Robbins is an 31 y.o. year old female who is a primary patient of [name], NP.  The [facility] Managed Care Coordination team was consulted for assistance with disease management and care coordination needs.    Engaged with patient by telephone for follow up visit in response to referral for case management and/or care coordination services.  Diamond Robbins was given information about Medicaid Managed Care Coordination team services today. Diamond Robbins Patient agreed to services and verbal consent obtained.  Objective:  Lab Results  Component Value Date   CREATININE 0.60 03/16/2021   CREATININE 0.62 03/01/2021   CREATININE 0.61 02/18/2021    Lab Results  Component Value Date   HGBA1C 7.0 (H) 02/18/2021       Component Value Date/Time   CHOL 265 (H) 02/18/2021 0934   TRIG 230 (H) 02/18/2021 0934   HDL 70 02/18/2021 0934   LDLCALC 153 (H) 02/18/2021 0934    BP Readings from Last 3 Encounters:  03/24/21 (!) 123/93  03/16/21 (!) 136/105  03/01/21 (!) 119/92    Assessment/Interventions: Review of patient past medical history, allergies, medications, health status, including review of consultants reports, laboratory and other test data, was performed as part of comprehensive evaluation and provision of chronic care management services.   SDOH:  (Social Determinants of Health) assessments and interventions performed:    Care Plan  No Known Allergies  Medications Reviewed Today     Reviewed by Hughes Better, RPH-CPP (Pharmacist) on 05/11/21 at 0907  Med List Status: <None>   Medication Order Taking? Sig Documenting Provider Last Dose Status Informant  ACCU-CHEK GUIDE test strip [id number]   [provider]  Active Self  Accu-Chek Softclix Lancets lancets [id number]  1 each 3 (three) times daily. [provider]  Active Self  acyclovir  (ZOVIRAX) 800 MG tablet [id number] Yes Take by mouth. [provider] Taking Active   amLODipine (NORVASC) 10 MG tablet [id number] Yes Take 1 tablet (10 mg total) by mouth daily. [name], NP Taking Active   aspirin 325 MG tablet [id number] Yes Take 325 mg by mouth daily. [provider] Taking Active   Blood Glucose Monitoring Suppl (ACCU-CHEK GUIDE) w/Device KIT [id number]   [provider]  Active Self  calcium carbonate (OS-CAL) 1250 (500 Ca) MG chewable tablet [id number] Yes Chew 1 tablet by mouth daily. [provider] Taking Active   cholecalciferol (VITAMIN D3) 25 MCG (1000 UNIT) tablet [id number] Yes Take 1,000 Units by mouth daily. [provider] Taking Active   folic acid (FOLVITE) 1 MG tablet [id number] Yes Take 1 mg by mouth daily. [provider] Taking Active   LANTUS SOLOSTAR 100 UNIT/ML Solostar Pen [id number]  Inject 30 Units into the skin at bedtime. [name], NP  Active   lenalidomide (REVLIMID) 10 MG capsule [id number]  Take 1 capsule daily for 21 days, then take 7 days off [name], MD  Active   metoprolol tartrate (LOPRESSOR) 25 MG tablet [id number] Yes Take 1 tablet (25 mg total) by mouth daily. [name], MD Taking Active   Multiple Vitamin (MULTIVITAMIN) tablet [id number] Yes Take 1 tablet by mouth daily. [provider] Taking Active   ondansetron (ZOFRAN) 8 MG tablet [id number] Yes Take by mouth. [provider] Taking Active   rosuvastatin (CRESTOR) 20 MG tablet [id number] Yes Take 1 tablet (20 mg total) by mouth daily. Pearline Cables,  Trinna Balloon, NP Taking Active   sulfamethoxazole-trimethoprim (BACTRIM DS) 800-160 MG tablet [id number] Yes Take 1 tablet by mouth 3 (three) times a week. [provider] Taking Active   Med List Note [name], CPhT 03/07/13 1442): No preferred pharmacy.             Patient Active Problem List   Diagnosis Date Noted   Pap smear for  cervical cancer screening 02/18/2021   T1DM (type 1 diabetes mellitus) ([place]) 06/11/2020   Multiple myeloma ([place]) 03/09/2020   Encounter for general adult medical examination with abnormal findings 03/09/2020   Chronic hypertension 05/24/2019    Conditions to be addressed/monitored per PCP order:  HTN and DMI  Care Plan : General Pharmacy (Adult)  Updates made by Hughes Better, RPH-CPP since 05/18/2021 12:00 AM     Problem: Chronic Disease Management   Priority: High     Goal: Patient Stated   Start Date: 05/11/2021  Expected End Date: 08/16/2021  This Visit's Progress: On track  Priority: High  Note:   Current Barriers:  Suboptimal therapeutic regimen for T1DM   Pharmacist Clinical Goal(s):  patient will adhere to plan to optimize therapeutic regimen for T1DM as evidenced by report of adherence to recommended medication management changes through collaboration with PharmD and provider.    Interventions: Inter-disciplinary care team collaboration (see longitudinal plan of care) Comprehensive medication review performed; medication list updated in electronic medical record  Diabetes:  Controlled; current treatment:Lantus 30 units once daily;   Current glucose readings: fasting glucose: 100-115, post prandial glucose: 115-130's  Denies hypoglycemic/hyperglycemic symptoms  Current meal patterns:  Breakfast: eggs, toast, bacon, grits Lunch: sandwich, chicken salad, or pizza Dinner: spaghetti or baked protein with veggies + potatoes Snacks: chocolate on occassion Drinks: water or diet soda on ocassion   Recommended PCP to obtain additional labs to verify T1DM versus T2DM to allow further optimization of regimen  Hypertension:  Uncontrolled; current treatment: amlodipine 64m, metoprolol tartate 216m   Current home readings: does not check at home  Denies hypotensive/hypertensive symptoms  Recommended patient obtain at home blood pressure monitor or check blood pressure  when at local pharmacy or [employer]  Patient Goals/Self-Care Activities patient will:  - check glucose 2-3x/day, document, and provide at future appointments  Follow Up Plan:  Telephone follow up appointment with care management team member scheduled for: 06/08/21 Care Manager; 07/02/21 Pharmacist Next PCP appointment scheduled for: 06/21/21      RaHughes BetterharmD, CPP High Risk [facility]aged [facility] [phone]

## 2021-05-25 ENCOUNTER — Ambulatory Visit (HOSPITAL_COMMUNITY): Payer: Medicaid Other | Admitting: Hematology

## 2021-05-25 ENCOUNTER — Inpatient Hospital Stay (HOSPITAL_BASED_OUTPATIENT_CLINIC_OR_DEPARTMENT_OTHER): Payer: Medicaid Other | Admitting: Hematology

## 2021-05-25 ENCOUNTER — Inpatient Hospital Stay (HOSPITAL_COMMUNITY): Payer: Medicaid Other

## 2021-05-25 VITALS — BP 132/104 | HR 99 | Temp 97.8°F | Resp 16 | Ht 65.5 in | Wt 181.6 lb

## 2021-05-25 VITALS — BP 120/81 | HR 65 | Temp 97.7°F | Resp 16

## 2021-05-25 DIAGNOSIS — I10 Essential (primary) hypertension: Secondary | ICD-10-CM | POA: Diagnosis not present

## 2021-05-25 DIAGNOSIS — C9000 Multiple myeloma not having achieved remission: Secondary | ICD-10-CM

## 2021-05-25 DIAGNOSIS — E109 Type 1 diabetes mellitus without complications: Secondary | ICD-10-CM | POA: Diagnosis not present

## 2021-05-25 LAB — COMPREHENSIVE METABOLIC PANEL
ALT: 60 U/L — ABNORMAL HIGH (ref 0–44)
AST: 38 U/L (ref 15–41)
Albumin: 4.3 g/dL (ref 3.5–5.0)
Alkaline Phosphatase: 83 U/L (ref 38–126)
Anion gap: 8 (ref 5–15)
BUN: 14 mg/dL (ref 6–20)
CO2: 24 mmol/L (ref 22–32)
Calcium: 9.2 mg/dL (ref 8.9–10.3)
Chloride: 106 mmol/L (ref 98–111)
Creatinine, Ser: 0.61 mg/dL (ref 0.44–1.00)
GFR, Estimated: >60 mL/min (ref >60)
Glucose, Bld: 251 mg/dL — ABNORMAL HIGH (ref 70–99)
Potassium: 3.9 mmol/L (ref 3.5–5.1)
Sodium: 138 mmol/L (ref 135–145)
Total Bilirubin: 0.7 mg/dL (ref 0.3–1.2)
Total Protein: 7.5 g/dL (ref 6.5–8.1)

## 2021-05-25 LAB — PREGNANCY, URINE: Preg Test, Ur: NEGATIVE

## 2021-05-25 MED ORDER — ZOLEDRONIC ACID 4 MG/100ML IV SOLN
4.0000 mg | Freq: Once | INTRAVENOUS | Status: AC
  Administered 2021-05-25: 4 mg via INTRAVENOUS
  Filled 2021-05-25: qty 100

## 2021-05-25 MED ORDER — SODIUM CHLORIDE 0.9 % IV SOLN: Freq: Once | INTRAVENOUS | Status: AC

## 2021-05-25 NOTE — Progress Notes (Signed)
[place], [place] [address]   CLINIC:  Medical Oncology/Hematology  PCP:  [name], NP None  None  REASON FOR VISIT:  Follow-up for IgA kappa multiple myeloma  PRIOR THERAPY: none  CURRENT THERAPY: RVD and Darzalex Faspro on D1, 4, 8, 11, and 15; Revlimid 2 weeks on, 1 week off; Zometa every 4 weeks  INTERVAL HISTORY:  [name], a 31 y.o. female, returns for routine follow-up for her IgA kappa multiple myeloma. Diamond Robbins was last seen on 11/01/20222.  Today she reports feeling good. She denies any recent infections. She is taking Revlimid and tolerating it well. She reports normal BM. She denies tingling/numbness and skin rash.  REVIEW OF SYSTEMS:  Review of Systems  Constitutional:  Negative for appetite change and fatigue.  Gastrointestinal:  Negative for constipation and diarrhea.  Skin:  Negative for rash.  Neurological:  Negative for numbness.  All other systems reviewed and are negative.  PAST MEDICAL/SURGICAL HISTORY:  Past Medical History:  Diagnosis Date   Cancer ([facility]) 08/01/2020   multiple myeloma   DKA (diabetic ketoacidosis) ([facility]) 02/23/2020   Hypertension    Type 1 diabetes mellitus ([facility]) 02/23/2020   Past Surgical History:  Procedure Laterality Date   BONE MARROW TRANSPLANT     NO PAST SURGERIES      SOCIAL HISTORY:  Social History   Socioeconomic History   Marital status: Married    Spouse name: Not on file   Number of children: Not on file   Years of education: Not on file   Highest education level: Not on file  Occupational History   Occupation: unemployed    Comment: can go back to work Jan 2023  Tobacco Use   Smoking status: Never   Smokeless tobacco: Never  Vaping Use   Vaping Use: Never used  Substance and Sexual Activity   Alcohol use: Not Currently    Comment: occ- 0-1 drinks per week; maybe 1 drink every 6 months   Drug use: No   Sexual activity: Yes    Birth  control/protection: Condom  Other Topics Concern   Not on file  Social History Narrative   Has 2 step-daughters   Social Determinants of Health   Financial Resource Strain: Not on file  Food Insecurity: No Food Insecurity   Worried About Charity fundraiser in the Last Year: Never true   [place] in the Last Year: Never true  Transportation Needs: No Transportation Needs   Lack of Transportation (Medical): No   Lack of Transportation (Non-Medical): No  Physical Activity: Not on file  Stress: Not on file  Social Connections: Socially Integrated   Frequency of Communication with Friends and Family: More than three times a week   Frequency of Social Gatherings with Friends and Family: More than three times a week   Attends Religious Services: More than 4 times per year   Active Member of [employer] or Organizations: Yes   Attends Archivist Meetings: Never   Marital Status: Married  Human resources officer Violence: Not on file    FAMILY HISTORY:  Family History  Problem Relation Age of Onset   Hypertension Mother    Diabetes Mother    Hypertension Father    Diabetes Maternal Grandmother     CURRENT MEDICATIONS:  Current Outpatient Medications  Medication Sig Dispense Refill   ACCU-CHEK GUIDE test strip      Accu-Chek Softclix Lancets lancets 1 each  3 (three) times daily.     acyclovir (ZOVIRAX) 800 MG tablet Take by mouth.     amLODipine (NORVASC) 10 MG tablet Take 1 tablet (10 mg total) by mouth daily. 30 tablet 1   aspirin 325 MG tablet Take 325 mg by mouth daily.     Blood Glucose Monitoring Suppl (ACCU-CHEK GUIDE) w/Device KIT      calcium carbonate (OS-CAL) 1250 (500 Ca) MG chewable tablet Chew 1 tablet by mouth daily.     cholecalciferol (VITAMIN D3) 25 MCG (1000 UNIT) tablet Take 1,000 Units by mouth daily.     folic acid (FOLVITE) 1 MG tablet Take 1 mg by mouth daily.     LANTUS SOLOSTAR 100 UNIT/ML Solostar Pen Inject 30 Units into the skin at bedtime. 15  mL 0   lenalidomide (REVLIMID) 10 MG capsule Take 1 capsule daily for 21 days, then take 7 days off 21 capsule 0   metoprolol tartrate (LOPRESSOR) 25 MG tablet Take 1 tablet (25 mg total) by mouth daily. 30 tablet 1   Multiple Vitamin (MULTIVITAMIN) tablet Take 1 tablet by mouth daily.     rosuvastatin (CRESTOR) 20 MG tablet Take 1 tablet (20 mg total) by mouth daily. 90 tablet 3   sulfamethoxazole-trimethoprim (BACTRIM DS) 800-160 MG tablet Take 1 tablet by mouth 3 (three) times a week.     ondansetron (ZOFRAN) 8 MG tablet Take by mouth. (Patient not taking: Reported on 05/25/2021)     No current facility-administered medications for this visit.    ALLERGIES:  No Known Allergies  PHYSICAL EXAM:  Performance status (ECOG): 1 - Symptomatic but completely ambulatory  Vitals:   05/25/21 0801  BP: (!) 132/104  Pulse: 99  Resp: 16  Temp: 97.8 F (36.6 C)  SpO2: 100%   Wt Readings from Last 3 Encounters:  05/25/21 181 lb 9.6 oz (82.4 kg)  03/16/21 177 lb 6.4 oz (80.5 kg)  03/01/21 173 lb 12.8 oz (78.8 kg)   Physical Exam Vitals reviewed.  Constitutional:      Appearance: Normal appearance.  Cardiovascular:     Rate and Rhythm: Normal rate and regular rhythm.     Pulses: Normal pulses.     Heart sounds: Normal heart sounds.  Pulmonary:     Effort: Pulmonary effort is normal.     Breath sounds: Normal breath sounds.  Musculoskeletal:     Right lower leg: No edema.     Left lower leg: No edema.  Neurological:     General: No focal deficit present.     Mental Status: She is alert and oriented to person, place, and time.  Psychiatric:        Mood and Affect: Mood normal.        Behavior: Behavior normal.    LABORATORY DATA:  I have reviewed the labs as listed.  CBC Latest Ref Rng & Units 03/16/2021 03/01/2021 02/18/2021  WBC 4.0 - 10.5 K/uL 4.5 3.0(L) 4.3  Hemoglobin 12.0 - 15.0 g/dL 13.0 12.8 13.4  Hematocrit 36.0 - 46.0 % 40.2 39.2 40.3  Platelets 150 - 400 K/uL 274 281  301   CMP Latest Ref Rng & Units 03/16/2021 03/01/2021 02/18/2021  Glucose 70 - 99 mg/dL 164(H) 191(H) 143(H)  BUN 6 - 20 mg/dL _0 Creatinine 0.44 - 1.00 mg/dL 0.60 0.62 0.61  Sodium 135 - 145 mmol/L 138 136 138  Potassium 3.5 - 5.1 mmol/L 3.6 3.5 4.5  Chloride 98 - 111 mmol/L 103 105 100  CO2 22 - 32 mmol/L 25 24 19(L)  Calcium 8.9 - 10.3 mg/dL 9.4 9.2 9.6  Total Protein 6.5 - 8.1 g/dL 7.8 7.6 7.7  Total Bilirubin 0.3 - 1.2 mg/dL 0.5 0.7 0.3  Alkaline Phos 38 - 126 U/L 74 77 93  AST 15 - 41 U/L _0 ALT 0 - 44 U/L 35 49(H) 39(H)      Component Value Date/Time   RBC 4.69 03/16/2021 0829   MCV 85.7 03/16/2021 0829   MCV 82 02/18/2021 0934   MCH 27.7 03/16/2021 0829   MCHC 32.3 03/16/2021 0829   RDW 13.6 03/16/2021 0829   RDW 13.2 02/18/2021 0934   LYMPHSABS 1.4 03/16/2021 0829   LYMPHSABS 1.1 02/18/2021 0934   MONOABS 0.4 03/16/2021 0829   EOSABS 0.4 03/16/2021 0829   EOSABS 0.5 (H) 02/18/2021 0934   BASOSABS 0.1 03/16/2021 0829   BASOSABS 0.0 02/18/2021 0934    DIAGNOSTIC IMAGING:  I have independently reviewed the scans and discussed with the patient. No results found.   ASSESSMENT:  1.  Stage III IgA kappa multiple myeloma: -Presentation to [facility] in [place] with confusion and weakness. -Found to have diabetic ketoacidosis with newly diagnosed type 1 diabetes. -Also found to have acute renal failure and hypercalcemia. -Bone marrow biopsy on 02/25/2020 with flow cytometry with the population CD138 positive, CD56 positive IgA kappa monoclonal plasma cells 12%.  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 -Ultrasound abdomen on 02/23/2020 showed increased hepatic echogenicity with normal spleen. -Renal ultrasound on 02/24/2020 was normal. -CT chest on 02/22/2020 with moth eaten appearance of the bones. -Beta-2  microglobulin 2.1.  LDH 322.  SPEP and immunofixation were normal.  Kappa light chains are elevated at 36.8.  Lambda light chains 12.9 and ratio of 2.85. -24-hour urine immunofixation was negative.  M spike was negative. -PET scan on 03/23/2020 showed no FDG radiotracer activity, no soft tissue plasmacytoma.  Diffuse multiple small lytic lesions within the pelvis, spine and vertebral bodies consistent with myeloma. -Chromosome analysis was 70, XX.  Multiple myeloma FISH panel was normal. -She is considered high risk based on elevated LDH level. -4 cycles of Dara VRD from 03/31/2020 through 06/12/2020. 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 - BMBX on 12/15/2020 with variably normocellular 30 to 70% with trilineage hematopoiesis.  No increase in plasma cells.  Myeloma FISH panel was normal.  Chromosome analysis was normal. - Maintenance Revlimid 10 mg 3 weeks on/1 week off started around 12/08/2020.   2.  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  Non-smoker. -No family history of myeloma or other malignancies.     PLAN:  1.  Stage III IgA kappa multiple myeloma: - MRD results from bone marrow from [facility] were negative in August. - Reviewed myeloma panel from 04/26/2021 done at [facility].  M spike is negative.  Immunofixation was normal.  Free light chain ratio was normal. - Reviewed labs today which showed mildly elevated ALT of 60.  Creatinine was normal. - We will obtain immunization schedule and give the shots in our clinic. - Continue Revlimid 10 mg 3 weeks on/1 week off. - RTC 12 weeks for follow-up with repeat myeloma labs 1 week prior.   2.  Type I diabetes: - Continue Lantus 30 units at bedtime.   3.  Hypertension: - Continue Norvasc and metoprolol and amlodipine.  4.  Bone protection:  - Calcium is 9.2.  Continue calcium supplements. - Continue Zometa today and every 12 weeks.  Orders placed this encounter:  No  orders of the defined types were placed in this encounter.    [name], MD [place] [phone]   I, Thana Ates, am acting as a scribe for Dr. [name].  I, [name] MD, have reviewed the above documentation for accuracy and completeness, and I agree with the above.

## 2021-05-25 NOTE — Progress Notes (Signed)
Patient presents today for Zometa infusion per providers order.  Vital signs within parameters.  Labs pending.  Patient has no new complaints at this time.  Peripheral IV started and blood return noted pre and post infusion.  Labs within parameters for treatment.  Zometa infusion given today per MD orders.  Stable during infusion without adverse affects.  Vital signs stable.  No complaints at this time.  Discharge from clinic ambulatory in stable condition.  Alert and oriented X 3.  Follow up with [facility] as scheduled.

## 2021-05-25 NOTE — Progress Notes (Signed)
Patient is taking Revlimid as prescribed.  She has not missed any doses and reports no side effects at this time.   

## 2021-05-25 NOTE — Patient Instructions (Signed)
[facility]  Discharge Instructions: Thank you for choosing [facility] to provide your oncology and hematology care.  If you have a lab appointment with the [facility], please come in thru the Main Entrance and check in at the main information desk.  Wear comfortable clothing and clothing appropriate for easy access to any Portacath or PICC line.   We strive to give you quality time with your provider. You may need to reschedule your appointment if you arrive late (15 or more minutes).  Arriving late affects you and other patients whose appointments are after yours.  Also, if you miss three or more appointments without notifying the office, you may be dismissed from the clinic at the providers discretion.      For prescription refill requests, have your pharmacy contact our office and allow 72 hours for refills to be completed.    Today you received the following chemotherapy and/or immunotherapy agents Zometa      To help prevent nausea and vomiting after your treatment, we encourage you to take your nausea medication as directed.  BELOW ARE SYMPTOMS THAT SHOULD BE REPORTED IMMEDIATELY: *FEVER GREATER THAN 100.4 F (38 C) OR HIGHER *CHILLS OR SWEATING *NAUSEA AND VOMITING THAT IS NOT CONTROLLED WITH YOUR NAUSEA MEDICATION *UNUSUAL SHORTNESS OF BREATH *UNUSUAL BRUISING OR BLEEDING *URINARY PROBLEMS (pain or burning when urinating, or frequent urination) *BOWEL PROBLEMS (unusual diarrhea, constipation, pain near the anus) TENDERNESS IN MOUTH AND THROAT WITH OR WITHOUT PRESENCE OF ULCERS (sore throat, sores in mouth, or a toothache) UNUSUAL RASH, SWELLING OR PAIN  UNUSUAL VAGINAL DISCHARGE OR ITCHING   Items with * indicate a potential emergency and should be followed up as soon as possible or go to the Emergency Department if any problems should occur.  Please show the CHEMOTHERAPY ALERT CARD or IMMUNOTHERAPY ALERT CARD at check-in to the Emergency  Department and triage nurse.  Should you have questions after your visit or need to cancel or reschedule your appointment, please contact [facility] [phone]  and follow the prompts.  Office hours are 8:00 a.m. to 4:30 p.m. Monday - Friday. Please note that voicemails left after 4:00 p.m. may not be returned until the following business day.  We are closed weekends and major holidays. You have access to a nurse at all times for urgent questions. Please call the main number to the clinic [phone] and follow the prompts.  For any non-urgent questions, you may also contact your provider using MyChart. We now offer e-Visits for anyone 16 and older to request care online for non-urgent symptoms. For details visit [email or web address].   Also download the MyChart app! Go to the app store, search "MyChart", open the app, select [facility], and log in with your MyChart username and password.  Due to Covid, a mask is required upon entering the hospital/clinic. If you do not have a mask, one will be given to you upon arrival. For doctor visits, patients may have 1 support person aged 74 or older with them. For treatment visits, patients cannot have anyone with them due to current Covid guidelines and our immunocompromised population.

## 2021-05-28 ENCOUNTER — Other Ambulatory Visit (HOSPITAL_COMMUNITY): Payer: Self-pay

## 2021-05-28 MED ORDER — LENALIDOMIDE 10 MG PO CAPS: ORAL_CAPSULE | ORAL | 0 refills | Status: DC

## 2021-05-28 NOTE — Telephone Encounter (Signed)
Chart reviewed. Revlimid refilled per last office note with Dr. Katragadda.  

## 2021-06-02 ENCOUNTER — Encounter (HOSPITAL_COMMUNITY): Payer: Self-pay

## 2021-06-02 NOTE — Progress Notes (Signed)
Patient did not fill Revlimid within 7 days of last pregnancy test. Patient reports that she does need a refill on Revlimid but has been waiting for the pharmacy to reach out. Patient scheduled for repeat pregnancy test on 1/19.

## 2021-06-03 ENCOUNTER — Inpatient Hospital Stay (HOSPITAL_COMMUNITY): Payer: Medicaid Other

## 2021-06-03 ENCOUNTER — Other Ambulatory Visit: Payer: Self-pay

## 2021-06-03 ENCOUNTER — Other Ambulatory Visit (HOSPITAL_COMMUNITY): Payer: Self-pay

## 2021-06-03 DIAGNOSIS — I10 Essential (primary) hypertension: Secondary | ICD-10-CM | POA: Diagnosis not present

## 2021-06-03 DIAGNOSIS — C9000 Multiple myeloma not having achieved remission: Secondary | ICD-10-CM

## 2021-06-03 DIAGNOSIS — E109 Type 1 diabetes mellitus without complications: Secondary | ICD-10-CM | POA: Diagnosis not present

## 2021-06-03 LAB — PREGNANCY, URINE: Preg Test, Ur: NEGATIVE

## 2021-06-03 MED ORDER — LENALIDOMIDE 10 MG PO CAPS: ORAL_CAPSULE | ORAL | 0 refills | Status: DC

## 2021-06-03 NOTE — Telephone Encounter (Signed)
Chart reviewed. Revlimid refilled per last office note with Dr. Katragadda.  

## 2021-06-08 ENCOUNTER — Other Ambulatory Visit: Payer: Self-pay

## 2021-06-08 ENCOUNTER — Other Ambulatory Visit: Payer: Self-pay | Admitting: *Deleted

## 2021-06-08 NOTE — Patient Instructions (Signed)
Visit Information  [name] was given information about Medicaid Managed Care team care coordination services as a part of their [facility] Medicaid benefit. Diamond Robbins verbally consented to engagement with the [facility] Managed Care team.   If you are experiencing a medical emergency, please call 911 or report to your local emergency department or urgent care.   If you have a non-emergency medical problem during routine business hours, please contact your provider's office and ask to speak with a nurse.   For questions related to your [facility] health plan, please call: [phone] or go here:[email or web address]  If you would like to schedule transportation through your [facility] plan, please call the following number at least 2 days in advance of your appointment: [phone].  Call the [facility] at [phone], at any time, 24 hours a day, 7 days a week. If you are in danger or need immediate medical attention call 911.  If you would like help to quit smoking, call 1-800-QUIT-NOW 581-319-0217) OR Espaol: 1-855-Djelo-Ya (9-507-225-7505) o para ms informacin haga clic aqu or Text READY to 200-400 to register via text  [name],   Please see education materials related to HTN provided by MyChart link.  The patient has access to MyChart and can view provided education.  No further follow up required: [name] can reach out to [facility] if new needs arise  Lurena Joiner RN, BSN [place] Coordinator   Following is a copy of your plan of care:  There are no care plans that you recently modified to display for this patient.

## 2021-06-08 NOTE — Patient Outreach (Signed)
Care Coordination  06/08/2021  Diamond Robbins 1990/09/15 [id number]  RNCM had a successful outreach today with [name]. She denies any needs today. She is aware of all upcoming appointments and will reach out to [facility] if new needs/barriers arise.  Lurena Joiner RN, BSN [place] RN Care Coordinator

## 2021-06-16 ENCOUNTER — Ambulatory Visit: Payer: Medicaid Other | Admitting: Nurse Practitioner

## 2021-06-16 DIAGNOSIS — Z419 Encounter for procedure for purposes other than remedying health state, unspecified: Secondary | ICD-10-CM | POA: Diagnosis not present

## 2021-06-21 ENCOUNTER — Other Ambulatory Visit: Payer: Self-pay

## 2021-06-21 ENCOUNTER — Ambulatory Visit (INDEPENDENT_AMBULATORY_CARE_PROVIDER_SITE_OTHER): Payer: Medicaid Other | Admitting: Nurse Practitioner

## 2021-06-21 ENCOUNTER — Ambulatory Visit: Payer: Medicaid Other | Admitting: Nurse Practitioner

## 2021-06-21 ENCOUNTER — Encounter: Payer: Self-pay | Admitting: Nurse Practitioner

## 2021-06-21 VITALS — BP 122/88 | HR 62 | Ht 62.0 in | Wt 181.0 lb

## 2021-06-21 DIAGNOSIS — E669 Obesity, unspecified: Secondary | ICD-10-CM

## 2021-06-21 DIAGNOSIS — E785 Hyperlipidemia, unspecified: Secondary | ICD-10-CM

## 2021-06-21 DIAGNOSIS — E109 Type 1 diabetes mellitus without complications: Secondary | ICD-10-CM | POA: Diagnosis not present

## 2021-06-21 DIAGNOSIS — I10 Essential (primary) hypertension: Secondary | ICD-10-CM

## 2021-06-21 DIAGNOSIS — E1069 Type 1 diabetes mellitus with other specified complication: Secondary | ICD-10-CM | POA: Diagnosis not present

## 2021-06-21 DIAGNOSIS — E559 Vitamin D deficiency, unspecified: Secondary | ICD-10-CM | POA: Diagnosis not present

## 2021-06-21 HISTORY — DX: Vitamin D deficiency, unspecified: E55.9

## 2021-06-21 HISTORY — DX: Type 1 diabetes mellitus with other specified complication: E10.69

## 2021-06-21 NOTE — Assessment & Plan Note (Signed)
Lab Results  Component Value Date   HGBA1C 7.0 (H) 02/18/2021  Pt states that she checks her blood sugar, two times daily she gets between 95 and 120. Pt stated that her blood sugar has been higher before but she has changed her eating habit. Pt denies having low blood sugar. Signs and symptoms of low blood sugar reviewed with pt A1C today takes lantus 30 units nightly.

## 2021-06-21 NOTE — Progress Notes (Signed)
° °  Diamond Robbins     MRN: [id number]      DOB: Jul 02, 1990   HPI Diamond Robbins is here for follow up and re-evaluation of chronic medical conditions, medication management and review of any available recent lab and radiology data.  Preventive health is updated, specifically  Cancer screening and Immunization.   Questions or concerns regarding consultations or procedures which the PT has had in the interim are  addressed. The PT denies any adverse reactions to current medications since the last visit.  There are no new concerns.  There are no specific complaints   Has multiple myeloma , follows up with oncology every 4 months..   Pt states that she checks her blood sugar, two times daily she gets between 95 and 120. Pt stated that her blood sugar has been higher before but she has changed her eating habit. Pt denies having low blood sugar. Signs and symptoms of low blood sugar reviewed with pt.   ROS Denies recent fever or chills. Denies sinus pressure, nasal congestion, ear pain or sore throat. Denies chest congestion, productive cough or wheezing. Denies chest pains, palpitations and leg swelling Denies abdominal pain, nausea, vomiting,diarrhea or constipation.   Denies dysuria, frequency, hesitancy or incontinence. Denies joint pain, swelling and limitation in mobility. Denies headaches, seizures, numbness, or tingling. Denies depression, anxiety or insomnia. Denies skin break down or rash.   PE  BP 122/88 (BP Location: Left Arm, Patient Position: Sitting, Cuff Size: Normal)    Pulse 62    Ht $R'5\' 2"'cz$  (1.575 m)    Wt 181 lb (82.1 kg)    LMP 07/27/2020 (Approximate)    SpO2 99%    Breastfeeding No    BMI 33.11 kg/m   Patient alert and oriented and in no cardiopulmonary distress.   Chest: Clear to auscultation bilaterally.  CVS: S1, S2 no murmurs, no S3.Regular rate.  ABD: Soft non tender.   Ext: No edema  MS: Adequate ROM spine, shoulders, hips and knees.  Skin: Intact, no  ulcerations or rash noted.  Psych: Good eye contact, normal affect. Memory intact not anxious or depressed appearing.  CNS: CN 2-12 intact, power,  normal throughout.no focal deficits noted.   Assessment & Plan

## 2021-06-21 NOTE — Assessment & Plan Note (Signed)
Check labs today  Takes vitamin d 1000 units daily.

## 2021-06-21 NOTE — Assessment & Plan Note (Signed)
Check lipid panel today. Takes crestor 20mg  daily Lab Results  Component Value Date   CHOL 265 (H) 02/18/2021   HDL 70 02/18/2021   LDLCALC 153 (H) 02/18/2021   TRIG 230 (H) 02/18/2021

## 2021-06-21 NOTE — Assessment & Plan Note (Signed)
DASH diet and commitment to daily physical activity for a minimum of 30 minutes discussed and encouraged, as a part of hypertension management. The importance of attaining a healthy weight is also discussed.  BP/Weight 06/21/2021 05/25/2021 05/25/2021 03/24/2021 03/16/2021 03/01/2021 94/07/7003  Systolic BP 259 102 890 228 406 986 148  Diastolic BP 88 81 307 93 354 92 94  Wt. (Lbs) 181 - 181.6 - 177.4 173.8 173  BMI 33.11 - 29.76 - 32.45 31.79 31.64  well controlled

## 2021-06-21 NOTE — Patient Instructions (Signed)
Please get your covid vaccine and TDAP vaccine   It is important that you exercise regularly at least 30 minutes 5 times a week.  Think about what you will eat, plan ahead. Choose " clean, green, fresh or frozen" over canned, processed or packaged foods which are more sugary, salty and fatty. 70 to 75% of food eaten should be vegetables and fruit. Three meals at set times with snacks allowed between meals, but they must be fruit or vegetables. Aim to eat over a 12 hour period , example 7 am to 7 pm, and STOP after  your last meal of the day. Drink water,generally about 64 ounces per day, no other drink is as healthy. Fruit juice is best enjoyed in a healthy way, by EATING the fruit.  Thanks for choosing [facility], we consider it a privelige to serve you.

## 2021-06-21 NOTE — Assessment & Plan Note (Signed)
Importance of healthy food choices with portion control discussed as well as eating regularly within 12  hour window.   The need to choose clean green food 50%-75% of time is discussed as well as make water the primary drink and set a goal for 64 ounces daily.  Patient reeducated about the importance of committment to minimum of 150 minutes of exercise per week.  Three meals at set times with snacks allowed between meals but they must be fruit or vegetable.   Aim to eat  over 12 hour period  for example 7 am to 7 pm. Stop after your last meal of the day.  Wt Readings from Last 3 Encounters:  06/21/21 181 lb (82.1 kg)  05/25/21 181 lb 9.6 oz (82.4 kg)  03/16/21 177 lb 6.4 oz (80.5 kg)

## 2021-06-22 ENCOUNTER — Encounter (HOSPITAL_COMMUNITY): Payer: Self-pay | Admitting: Hematology and Oncology

## 2021-06-22 ENCOUNTER — Other Ambulatory Visit: Payer: Self-pay | Admitting: Nurse Practitioner

## 2021-06-22 DIAGNOSIS — E785 Hyperlipidemia, unspecified: Secondary | ICD-10-CM

## 2021-06-22 DIAGNOSIS — E109 Type 1 diabetes mellitus without complications: Secondary | ICD-10-CM

## 2021-06-22 DIAGNOSIS — E1069 Type 1 diabetes mellitus with other specified complication: Secondary | ICD-10-CM

## 2021-06-22 LAB — LIPID PANEL
Chol/HDL Ratio: 2.5 ratio (ref 0.0–4.4)
Cholesterol, Total: 158 mg/dL (ref 100–199)
HDL: 62 mg/dL (ref >39)
LDL Chol Calc (NIH): 74 mg/dL (ref 0–99)
Triglycerides: 125 mg/dL (ref 0–149)
VLDL Cholesterol Cal: 22 mg/dL (ref 5–40)

## 2021-06-22 LAB — HEMOGLOBIN A1C
Est. average glucose Bld gHb Est-mCnc: 240 mg/dL
Hgb A1c MFr Bld: 10 % — ABNORMAL HIGH (ref 4.8–5.6)

## 2021-06-22 LAB — VITAMIN D 25 HYDROXY (VIT D DEFICIENCY, FRACTURES): Vit D, 25-Hydroxy: 42.2 ng/mL (ref 30.0–100.0)

## 2021-06-22 MED ORDER — ROSUVASTATIN CALCIUM 10 MG PO TABS: 10.0000 mg | ORAL_TABLET | Freq: Every day | ORAL | 3 refills | Status: DC

## 2021-06-22 NOTE — Progress Notes (Signed)
Please review results with patient. A1c went up from 7.0-10.0.  I have referred patient to endocrinology for management of her diabetes type 1.  She should be expecting a call from them.  Patient should avoid sugar sweet, soda.  Her LDL is not at goal, patient should start taking Crestor 30 mg daily.  I sent in a prescription for Crestor 10 mg, she should take a total of 30 mg of Crestor daily  Please schedule patient for 6-week follow-up for her hyperlipidemia.  I have ordered labs to be done 3 to 5 days before the visit.  Vitamin d is normal .  Thank you

## 2021-06-23 NOTE — Patient Instructions (Signed)

## 2021-06-24 ENCOUNTER — Other Ambulatory Visit: Payer: Self-pay

## 2021-06-24 ENCOUNTER — Encounter: Payer: Self-pay | Admitting: Nurse Practitioner

## 2021-06-24 ENCOUNTER — Ambulatory Visit (INDEPENDENT_AMBULATORY_CARE_PROVIDER_SITE_OTHER): Payer: Medicaid Other | Admitting: Nurse Practitioner

## 2021-06-24 VITALS — BP 132/81 | HR 93 | Ht 65.5 in | Wt 183.0 lb

## 2021-06-24 DIAGNOSIS — E1065 Type 1 diabetes mellitus with hyperglycemia: Secondary | ICD-10-CM | POA: Diagnosis not present

## 2021-06-24 DIAGNOSIS — E782 Mixed hyperlipidemia: Secondary | ICD-10-CM

## 2021-06-24 DIAGNOSIS — I10 Essential (primary) hypertension: Secondary | ICD-10-CM | POA: Diagnosis not present

## 2021-06-24 LAB — POCT UA - MICROALBUMIN
Creatinine, POC: 200 mg/dL
Microalbumin Ur, POC: 150 mg/L

## 2021-06-24 NOTE — Progress Notes (Signed)
Endocrinology Consult Note       06/24/2021, 9:31 AM   Subjective:    Patient ID: Diamond Robbins, female    DOB: [birth year].  Diamond Robbins is being seen in consultation for management of currently uncontrolled symptomatic diabetes requested by  Renee Rival, FNP.   Past Medical History:  Diagnosis Date   Cancer ([place]) 08/01/2020   multiple myeloma   DKA (diabetic ketoacidosis) ([place]) 02/23/2020   Hypertension    Type 1 diabetes mellitus ([place]) 02/23/2020    Past Surgical History:  Procedure Laterality Date   BONE MARROW TRANSPLANT     NO PAST SURGERIES      Social History   Socioeconomic History   Marital status: Married    Spouse name: Not on file   Number of children: Not on file   Years of education: Not on file   Highest education level: Not on file  Occupational History   Occupation: unemployed    Comment: can go back to work Jan 2023  Tobacco Use   Smoking status: Never   Smokeless tobacco: Never  Vaping Use   Vaping Use: Never used  Substance and Sexual Activity   Alcohol use: Not Currently    Comment: occ- 0-1 drinks per week; maybe 1 drink every 6 months   Drug use: No   Sexual activity: Yes    Birth control/protection: Condom  Other Topics Concern   Not on file  Social History Narrative   Has 2 step-daughters   Social Determinants of Health   Financial Resource Strain: Not on file  Food Insecurity: No Food Insecurity   Worried About Charity fundraiser in the Last Year: Never true   [place] in the Last Year: Never true  Transportation Needs: No Transportation Needs   Lack of Transportation (Medical): No   Lack of Transportation (Non-Medical): No  Physical Activity: Sufficiently Active   Days of Exercise per Week: 3 days   Minutes of Exercise per Session: 60 min  Stress: Not on file  Social Connections: Socially Integrated   Frequency of Communication with  Friends and Family: More than three times a week   Frequency of Social Gatherings with Friends and Family: More than three times a week   Attends Religious Services: More than 4 times per year   Active Member of [employer] or Organizations: Yes   Attends Archivist Meetings: Never   Marital Status: Married    Family History  Problem Relation Age of Onset   Hypertension Mother    Diabetes Mother    Hypertension Father    Diabetes Maternal Grandmother     Outpatient Encounter Medications as of 06/24/2021  Medication Sig   acyclovir (ZOVIRAX) 800 MG tablet Take by mouth.   amLODipine (NORVASC) 10 MG tablet Take 1 tablet (10 mg total) by mouth daily.   aspirin 325 MG tablet Take 325 mg by mouth daily.   calcium carbonate (OS-CAL) 1250 (500 Ca) MG chewable tablet Chew 1 tablet by mouth daily.   cholecalciferol (VITAMIN D3) 25 MCG (1000 UNIT) tablet Take 1,000 Units by mouth daily.   folic acid (FOLVITE) 1 MG tablet Take 1 mg by  mouth daily.   LANTUS SOLOSTAR 100 UNIT/ML Solostar Pen Inject 30 Units into the skin at bedtime.   lenalidomide (REVLIMID) 10 MG capsule Take 1 capsule daily for 21 days, then take 7 days off   metoprolol tartrate (LOPRESSOR) 25 MG tablet Take 1 tablet (25 mg total) by mouth daily.   Multiple Vitamin (MULTIVITAMIN) tablet Take 1 tablet by mouth daily.   rosuvastatin (CRESTOR) 10 MG tablet Take 1 tablet (10 mg total) by mouth daily.   ACCU-CHEK GUIDE test strip    Accu-Chek Softclix Lancets lancets 1 each 3 (three) times daily.   Blood Glucose Monitoring Suppl (ACCU-CHEK GUIDE) w/Device KIT    [DISCONTINUED] ondansetron (ZOFRAN) 8 MG tablet Take by mouth. (Patient not taking: Reported on 05/25/2021)   [DISCONTINUED] rosuvastatin (CRESTOR) 20 MG tablet Take 1 tablet (20 mg total) by mouth daily.   No facility-administered encounter medications on file as of 06/24/2021.    ALLERGIES: No Known Allergies  VACCINATION STATUS: Immunization History  Administered  Date(s) Administered   DTaP / HiB / IPV 03/24/2021   Hepatitis B, adult 03/24/2021   Influenza,inj,Quad PF,6+ Mos 03/16/2021   Pneumococcal Conjugate-13 03/24/2021    Diabetes She presents for her initial diabetic visit. She has type 1 diabetes mellitus. Onset time: Diagnosed at approx age of 76. Her disease course has been worsening. There are no hypoglycemic associated symptoms. There are no diabetic associated symptoms. There are no hypoglycemic complications. Symptoms are stable. There are no diabetic complications. Risk factors for coronary artery disease include diabetes mellitus, dyslipidemia, hypertension and sedentary lifestyle. Current diabetic treatment includes insulin injections. She is compliant with treatment most of the time. Her weight is fluctuating minimally. She is following a generally unhealthy diet. When asked about meal planning, she reported none. She has not had a previous visit with a dietitian. She participates in exercise intermittently. (She presents today for her consultation with no meter or logs to review.  Her most recent A1c on 06/21/21 was 10%, worsening from previous reading of 7%.  She admits she has not done well recently with her diet.  She typically only checks glucose once daily, around lunch time.  She eats 3 meals per day, no snacks, and drinks juice, water with SF flavoring, and diet soda.  She just joined a gym and has plans to start working out regularly.  She is UTD on eye exam, has never seen podiatry before.) An ACE inhibitor/angiotensin II receptor blocker is not being taken. She does not see a podiatrist.Eye exam is current.  Hypertension This is a chronic problem. The current episode started more than 1 year ago. The problem has been resolved since onset. The problem is controlled. There are no associated agents to hypertension. Risk factors for coronary artery disease include diabetes mellitus, dyslipidemia, family history and sedentary lifestyle. Past  treatments include beta blockers and calcium channel blockers. The current treatment provides moderate improvement. Compliance problems include exercise and diet.   Hyperlipidemia This is a chronic problem. The current episode started more than 1 year ago. The problem is controlled. Recent lipid tests were reviewed and are normal. Exacerbating diseases include diabetes and obesity. Factors aggravating her hyperlipidemia include beta blockers and fatty foods. Current antihyperlipidemic treatment includes statins. The current treatment provides moderate improvement of lipids. Compliance problems include adherence to diet and adherence to exercise.  Risk factors for coronary artery disease include diabetes mellitus, dyslipidemia, family history, female sex, hypertension and a sedentary lifestyle.    Review of systems  Constitutional: + Minimally fluctuating body weight, current Body mass index is 29.99 kg/m., no fatigue, no subjective hyperthermia, no subjective hypothermia Eyes: no blurry vision, no xerophthalmia ENT: no sore throat, no nodules palpated in throat, no dysphagia/odynophagia, no hoarseness Cardiovascular: no chest pain, no shortness of breath, no palpitations, no leg swelling Respiratory: no cough, no shortness of breath Gastrointestinal: no nausea/vomiting/diarrhea Musculoskeletal: no muscle/joint aches Skin: no rashes, no hyperemia Neurological: no tremors, no numbness, no tingling, no dizziness Psychiatric: no depression, no anxiety  Objective:     BP 132/81    Pulse 93    Ht 5' 5.5" (1.664 m)    Wt 183 lb (83 kg)    BMI 29.99 kg/m   Wt Readings from Last 3 Encounters:  06/24/21 183 lb (83 kg)  06/21/21 181 lb (82.1 kg)  05/25/21 181 lb 9.6 oz (82.4 kg)     BP Readings from Last 3 Encounters:  06/24/21 132/81  06/21/21 122/88  05/25/21 120/81     Physical Exam- Limited  Constitutional:  Body mass index is 29.99 kg/m. , not in acute distress, somewhat distracted  state of mind- was on her phone off/on during visit Eyes:  EOMI, no exophthalmos Neck: Supple Cardiovascular: RRR, no murmurs, rubs, or gallops, no edema Respiratory: Adequate breathing efforts, no crackles, rales, rhonchi, or wheezing Musculoskeletal: no gross deformities, strength intact in all four extremities, no gross restriction of joint movements Skin:  no rashes, no hyperemia Neurological: no tremor with outstretched hands    CMP ( most recent) CMP     Component Value Date/Time   NA 138 05/25/2021 0909   NA 138 02/18/2021 0934   K 3.9 05/25/2021 0909   CL 106 05/25/2021 0909   CO2 24 05/25/2021 0909   GLUCOSE 251 (H) 05/25/2021 0909   BUN 14 05/25/2021 0909   BUN 11 02/18/2021 0934   CREATININE 0.61 05/25/2021 0909   CALCIUM 9.2 05/25/2021 0909   PROT 7.5 05/25/2021 0909   PROT 7.7 02/18/2021 0934   ALBUMIN 4.3 05/25/2021 0909   ALBUMIN 5.2 (H) 02/18/2021 0934   AST 38 05/25/2021 0909   ALT 60 (H) 05/25/2021 0909   ALKPHOS 83 05/25/2021 0909   BILITOT 0.7 05/25/2021 0909   BILITOT 0.3 02/18/2021 0934   GFRNONAA >60 05/25/2021 0909     Diabetic Labs (most recent): Lab Results  Component Value Date   HGBA1C 10.0 (H) 06/21/2021   HGBA1C 7.0 (H) 02/18/2021   HGBA1C 8.8 (H) 03/11/2020     Lipid Panel ( most recent) Lipid Panel     Component Value Date/Time   CHOL 158 06/21/2021 1011   TRIG 125 06/21/2021 1011   HDL 62 06/21/2021 1011   CHOLHDL 2.5 06/21/2021 1011   LDLCALC 74 06/21/2021 1011   LABVLDL 22 06/21/2021 1011      Lab Results  Component Value Date   TSH 2.430 02/18/2021           Assessment & Plan:   1) Type 1 diabetes mellitus with hyperglycemia ([place])  She presents today for her consultation with no meter or logs to review.  Her most recent A1c on 06/21/21 was 10%, worsening from previous reading of 7%.  She admits she has not done well recently with her diet.  She typically only checks glucose once daily, around lunch time.  She  eats 3 meals per day, no snacks, and drinks juice, water with SF flavoring, and diet soda.  She just joined a gym and has plans  to start working out regularly.  She is UTD on eye exam, has never seen podiatry before.  - Diamond Robbins has currently uncontrolled symptomatic type 2 DM since 31 years of age, with most recent A1c of 10%.   -Recent labs reviewed.  POCT UM shows mild microalbuminuria at 150.  May be related to dehydration?-as her recent kidney function on blood work was normal.   - I had a long discussion with her about the progressive nature of diabetes and the pathology behind its complications. -her diabetes is not currently complicated but she remains at a high risk for more acute and chronic complications which include CAD, CVA, CKD, retinopathy, and neuropathy. These are all discussed in detail with her.  The following Lifestyle Medicine recommendations according to [facility]) were discussed and offered to patient and she agrees to start the journey:  A. Whole Foods, Plant-based plate comprising of fruits and vegetables, plant-based proteins, whole-grain carbohydrates was discussed in detail with the patient.   A list for source of those nutrients were also provided to the patient.  Patient will use only water or unsweetened tea for hydration. B.  The need to stay away from risky substances including alcohol, smoking; obtaining 7 to 9 hours of restorative sleep, at least 150 minutes of moderate intensity exercise weekly, the importance of healthy social connections,  and stress reduction techniques were discussed. C.  A full color page of  Calorie density of various food groups per pound showing examples of each food groups was provided to the patient.  - I have counseled her on diet and weight management by adopting a carbohydrate restricted/protein rich diet. Patient is encouraged to switch to unprocessed or minimally processed complex starch and  increased protein intake (animal or plant source), fruits, and vegetables. -  she is advised to stick to a routine mealtimes to eat 3 meals a day and avoid unnecessary snacks (to snack only to correct hypoglycemia).   - she acknowledges that there is a room for improvement in her food and drink choices. - Suggestion is made for her to avoid simple carbohydrates from her diet including Cakes, Sweet Desserts, Ice Cream, Soda (diet and regular), Sweet Tea, Candies, Chips, Cookies, Store Bought Juices, Alcohol in Excess of 1-2 drinks a day, Artificial Sweeteners, Coffee Creamer, and "Sugar-free" Products. This will help patient to have more stable blood glucose profile and potentially avoid unintended weight gain.  - she will be scheduled with Jearld Fenton, RDN, CDE for diabetes education.  - I have approached her with the following individualized plan to manage her diabetes and patient agrees:   - She is advised to continue her Lantus 30 units SQ nightly for now.  She will likely need prandial insulin but need to gather more information before starting.  -she is encouraged to start monitoring glucose 4 times daily, before meals and before bed, to log their readings on the clinic sheets provided, and bring them to review at follow up appointment in 2 weeks.  - she is warned not to take insulin without proper monitoring per orders. - Adjustment parameters are given to her for hypo and hyperglycemia in writing. - she is encouraged to call clinic for blood glucose levels less than 70 or above 300 mg /dl.  -Given her type 1 diagnosis, insulin is the only treatment option for her.  - Specific targets for  A1c; LDL, HDL, and Triglycerides were discussed with the patient.  2) Blood Pressure /Hypertension:  her blood pressure is controlled to target.   she is advised to continue her current medications including Norvasc 10 mg po daily and Metoprolol 25 mg p.o. daily with breakfast.  3)  Lipids/Hyperlipidemia:    Review of her recent lipid panel from 06/21/21 showed controlled LDL at 74.  she is advised to continue Crestor 10 mg daily at bedtime.  Side effects and precautions discussed with her.  4)  Weight/Diet:  her Body mass index is 29.99 kg/m.  -  clearly complicating her diabetes care.   she is a candidate for some weight loss. I discussed with her the fact that loss of 5 - 10% of her  current body weight will have the most impact on her diabetes management.  Exercise, and detailed carbohydrates information provided  -  detailed on discharge instructions.  5) Chronic Care/Health Maintenance: -she is not on ACEI/ARB and is on Statin medications and is encouraged to initiate and continue to follow up with Ophthalmology, Dentist, Podiatrist at least yearly or according to recommendations, and advised to stay away from smoking. I have recommended yearly flu vaccine and pneumonia vaccine at least every 5 years; moderate intensity exercise for up to 150 minutes weekly; and sleep for at least 7 hours a day.  - she is advised to maintain close follow up with Renee Rival, FNP for primary care needs, as well as her other providers for optimal and coordinated care.   - Time spent in this patient care: 60 min, of which > 50% was spent in counseling her about her diabetes and the rest reviewing her blood glucose logs, discussing her hypoglycemia and hyperglycemia episodes, reviewing her current and previous labs/studies (including abstraction from other facilities) and medications doses and developing a long term treatment plan based on the latest standards of care/guidelines; and documenting her care.    Please refer to Patient Instructions for Blood Glucose Monitoring and Insulin/Medications Dosing Guide" in media tab for additional information. Please also refer to "Patient Self Inventory" in the Media tab for reviewed elements of pertinent patient history.  Diamond Robbins  participated in the discussions, expressed understanding, and voiced agreement with the above plans.  All questions were answered to her satisfaction. she is encouraged to contact clinic should she have any questions or concerns prior to her return visit.     Follow up plan: - Return in about 2 weeks (around 07/08/2021) for Diabetes F/U, Bring meter and logs, No previsit labs.    [name], [facility] [address] [place], [place] [address] Phone: [phone] Fax: [phone]  06/24/2021, 9:31 AM

## 2021-06-25 ENCOUNTER — Telehealth: Payer: Self-pay | Admitting: Nurse Practitioner

## 2021-06-25 ENCOUNTER — Other Ambulatory Visit: Payer: Self-pay

## 2021-06-25 DIAGNOSIS — E785 Hyperlipidemia, unspecified: Secondary | ICD-10-CM

## 2021-06-25 DIAGNOSIS — E1069 Type 1 diabetes mellitus with other specified complication: Secondary | ICD-10-CM

## 2021-06-25 MED ORDER — ROSUVASTATIN CALCIUM 10 MG PO TABS: 10.0000 mg | ORAL_TABLET | Freq: Every day | ORAL | 3 refills | Status: DC

## 2021-06-25 NOTE — Telephone Encounter (Signed)
Pt is calling she states that the cholesterol medication that was supposed to be called in --the pharmacy is out, and she needs it sent somewhere else.

## 2021-06-25 NOTE — Telephone Encounter (Signed)
Spoke with pt she states that [facility] doesn't know when they will have the medicine in. Sent prescription to [facility] in [place] [address]

## 2021-07-01 ENCOUNTER — Encounter: Payer: Self-pay | Admitting: Nurse Practitioner

## 2021-07-01 ENCOUNTER — Other Ambulatory Visit: Payer: Self-pay

## 2021-07-01 ENCOUNTER — Ambulatory Visit (INDEPENDENT_AMBULATORY_CARE_PROVIDER_SITE_OTHER): Payer: Medicaid Other | Admitting: Nurse Practitioner

## 2021-07-01 ENCOUNTER — Inpatient Hospital Stay (HOSPITAL_COMMUNITY): Payer: Medicaid Other | Attending: Hematology

## 2021-07-01 DIAGNOSIS — C9000 Multiple myeloma not having achieved remission: Secondary | ICD-10-CM | POA: Diagnosis not present

## 2021-07-01 DIAGNOSIS — R051 Acute cough: Secondary | ICD-10-CM | POA: Diagnosis not present

## 2021-07-01 DIAGNOSIS — Z3200 Encounter for pregnancy test, result unknown: Secondary | ICD-10-CM | POA: Diagnosis not present

## 2021-07-01 DIAGNOSIS — J019 Acute sinusitis, unspecified: Secondary | ICD-10-CM | POA: Insufficient documentation

## 2021-07-01 LAB — PREGNANCY, URINE: Preg Test, Ur: NEGATIVE

## 2021-07-01 MED ORDER — FLUTICASONE PROPIONATE 50 MCG/ACT NA SUSP: 2.0000 | Freq: Every day | NASAL | 6 refills | Status: AC

## 2021-07-01 MED ORDER — CETIRIZINE HCL 10 MG PO TABS: 10.0000 mg | ORAL_TABLET | Freq: Every day | ORAL | 1 refills | Status: DC

## 2021-07-01 NOTE — Assessment & Plan Note (Signed)
Patient will come to the office tomorrow to be tested for flu and COVID, take OTC cough medication as needed.  Patient encouraged to get COVID-vaccine , need for COVID-vaccine explained to the patient she verbalized understanding.

## 2021-07-01 NOTE — Assessment & Plan Note (Addendum)
RX  Zyrtec 10 mg daily       Flonase nasal spray, 2 spray daily

## 2021-07-01 NOTE — Progress Notes (Addendum)
Virtual Visit via Telephone Note  I connected with Diamond Robbins @ on 07/01/21 at  4:40 PM EST by telephone and verified that I am speaking with the correct person using two identifiers.  I spent seven  minutes talking to the patient.   Location: Patient: home Provider: office   I discussed the limitations, risks, security and privacy concerns of performing an evaluation and management service by telephone and the availability of in person appointments. I also discussed with the patient that there may be a patient responsible charge related to this service. The patient expressed understanding and agreed to proceed.   History of Present Illness: Pt c/o non productive cough,  nasal congestion, runny nose with clear nasal discharge that started 2 days ago. Denis fever, chills, sob, wheezing, CP. Denies know sick contact. She has had her flu vaccine but did not get the COVID. She took Copywriter, advertising today and it helped her symptoms a little bit.    Observations/Objective: Assessment and Plan: Acute rhinosinusitis. RX  Zyrtec 10 mg daily                                         Flonase nasal spray, use 2 spray daily Acute Cough.  Patient will come to the office tomorrow to be tested for flu and COVID, take OTC cough medication as needed.  Patient encouraged to get COVID-vaccine , need for COVID-vaccine explained to the patient she verbalized understanding.  Follow Up Instructions:    I discussed the assessment and treatment plan with the patient. The patient was provided an opportunity to ask questions and all were answered. The patient agreed with the plan and demonstrated an understanding of the instructions.   The patient was advised to call back or seek an in-person evaluation if the symptoms worsen or if the condition fails to improve as anticipated.

## 2021-07-02 ENCOUNTER — Other Ambulatory Visit (HOSPITAL_COMMUNITY): Payer: Self-pay

## 2021-07-02 ENCOUNTER — Other Ambulatory Visit: Payer: Self-pay

## 2021-07-02 MED ORDER — LENALIDOMIDE 10 MG PO CAPS: ORAL_CAPSULE | ORAL | 0 refills | Status: DC

## 2021-07-02 NOTE — Telephone Encounter (Signed)
Chart reviewed. Revlimid refilled per last office note with Dr. Katragadda.  

## 2021-07-05 ENCOUNTER — Other Ambulatory Visit: Payer: Self-pay

## 2021-07-05 ENCOUNTER — Telehealth: Payer: Self-pay | Admitting: Nurse Practitioner

## 2021-07-05 MED ORDER — DTAP-IPV VACCINE IM SUSP: 0.5000 mL | Freq: Once | INTRAMUSCULAR | Status: DC

## 2021-07-05 MED ORDER — PNEUMOCOCCAL 13-VAL CONJ VACC IM SUSP: 0.5000 mL | Freq: Once | INTRAMUSCULAR | Status: DC

## 2021-07-05 MED ORDER — HAEMOPHILUS B POLYSAC CONJ VAC IM SOLR: 0.5000 mL | Freq: Once | INTRAMUSCULAR | Status: DC

## 2021-07-05 MED ORDER — LANTUS SOLOSTAR 100 UNIT/ML ~~LOC~~ SOPN: 30.0000 [IU] | PEN_INJECTOR | Freq: Every evening | SUBCUTANEOUS | 0 refills | Status: DC

## 2021-07-05 NOTE — Telephone Encounter (Signed)
Pt needs refill on   LANTUS SOLOSTAR 100 UNIT/ML Solostar Pen   [facility]

## 2021-07-05 NOTE — Telephone Encounter (Signed)
Refills sent

## 2021-07-08 ENCOUNTER — Telehealth: Payer: Self-pay

## 2021-07-08 ENCOUNTER — Other Ambulatory Visit: Payer: Self-pay

## 2021-07-08 MED ORDER — LANTUS SOLOSTAR 100 UNIT/ML ~~LOC~~ SOPN
30.0000 [IU] | PEN_INJECTOR | Freq: Every evening | SUBCUTANEOUS | 0 refills | Status: DC
Start: 2021-07-08 — End: 2021-07-09

## 2021-07-08 NOTE — Telephone Encounter (Signed)
Patient called said the insurance will not cover her insulin lantus, and there were other 3 brands is being rejected as well.  Can something else be called that will cover by her insurance.  That can control her sugar?  Please contact patient at [phone].

## 2021-07-08 NOTE — Telephone Encounter (Signed)
Resent to [facility].

## 2021-07-08 NOTE — Telephone Encounter (Signed)
[facility] called said [facility] does not have this yet  She is on her last pen on Friday.    [facility]: [facility]

## 2021-07-09 ENCOUNTER — Ambulatory Visit: Payer: Medicaid Other | Admitting: Nurse Practitioner

## 2021-07-09 ENCOUNTER — Telehealth: Payer: Self-pay | Admitting: Nurse Practitioner

## 2021-07-09 MED ORDER — TRESIBA FLEXTOUCH 100 UNIT/ML ~~LOC~~ SOPN: 30.0000 [IU] | PEN_INJECTOR | Freq: Every day | SUBCUTANEOUS | 3 refills | Status: DC

## 2021-07-09 NOTE — Telephone Encounter (Signed)
Patient aware.

## 2021-07-09 NOTE — Telephone Encounter (Signed)
Pt called and said that her Lantus is not covered by insurance, can something else be called in?

## 2021-07-09 NOTE — Telephone Encounter (Signed)
Pt made aware

## 2021-07-09 NOTE — Patient Instructions (Incomplete)

## 2021-07-12 ENCOUNTER — Telehealth: Payer: Self-pay | Admitting: Nurse Practitioner

## 2021-07-12 ENCOUNTER — Telehealth: Payer: Self-pay

## 2021-07-12 ENCOUNTER — Inpatient Hospital Stay (HOSPITAL_COMMUNITY): Payer: Medicaid Other

## 2021-07-12 DIAGNOSIS — Z23 Encounter for immunization: Secondary | ICD-10-CM

## 2021-07-12 DIAGNOSIS — C9000 Multiple myeloma not having achieved remission: Secondary | ICD-10-CM

## 2021-07-12 NOTE — Telephone Encounter (Signed)
Ok to give pt a sample of [place] per Dr.Nida.

## 2021-07-12 NOTE — Telephone Encounter (Signed)
There were not any samples like we thought. I did make the patient aware. She only has enough insulin for tonight and tomorrw.

## 2021-07-12 NOTE — Telephone Encounter (Signed)
Patient called in to [facility] in regard to tele messages from 07/09/21  Patient has spoken with insurance company in regard to Lantus and insurance needs Prior authorization as well as internal notes.

## 2021-07-12 NOTE — Telephone Encounter (Signed)
Patient called back - she said that she called back and the list of ones covered are Aspairt Flex Pen and Aspart Solution, Degludec Solution and [place]. This is everything she spelled out for me. Please Advise. She only has enough insulin for tonight and tomorrow. Can I give her a sample?

## 2021-07-12 NOTE — Telephone Encounter (Signed)
Patient is coming by to get a sample of [place]. It is in the fridge in a bag.   Megan, Can you work on her PA?  Thank you

## 2021-07-12 NOTE — Telephone Encounter (Signed)
Patient called back and gave me names but they were not insulins. I am not sure what information they are providing to her but she is going to call them back. I told her that Lantus and Tyler Aas is not covered. They keep telling her that if you do a PA on the Lantus it will be covered

## 2021-07-12 NOTE — Telephone Encounter (Signed)
Thank you, spoke with patient, she will call her insurance and see what is covered and we can send that in

## 2021-07-12 NOTE — Telephone Encounter (Signed)
Received a fax that the [place] sent in is not covered by the patients insurance. (Lantus is not covered either per previous telephone note.)  I called the patient and left a detailed voice message for her to call her insurance company and find out what the alternative to Lantus and/or Tyler Aas is that is covered and call us back so we can send to her pharmacy.

## 2021-07-13 ENCOUNTER — Other Ambulatory Visit: Payer: Self-pay

## 2021-07-13 MED ORDER — LEVEMIR FLEXPEN 100 UNIT/ML ~~LOC~~ SOPN: 30.0000 [IU] | PEN_INJECTOR | Freq: Every day | SUBCUTANEOUS | 3 refills | Status: DC

## 2021-07-13 MED ORDER — INSULIN DETEMIR 100 UNIT/ML FLEXPEN
30.0000 [IU] | Freq: Every day | SUBCUTANEOUS | 3 refills | Status: DC
Start: 2021-07-13 — End: 2021-07-13

## 2021-07-13 MED ORDER — INSULIN DETEMIR 100 UNIT/ML FLEXPEN: 30.0000 [IU] | Freq: Every day | SUBCUTANEOUS | 3 refills | Status: DC

## 2021-07-13 NOTE — Telephone Encounter (Signed)
Pt said it is covered and they will have it for her tomorrow to pick up

## 2021-07-13 NOTE — Telephone Encounter (Signed)
Patient called to follow up on this, informed her you sent in Levemir from the other encounter note, it looks like it was sent in as "print" as the pharmacy told patient they did not receive that, if you can resend that for the patient so she can see how much it will cost her.

## 2021-07-13 NOTE — Telephone Encounter (Signed)
I will call the pharmacy when they open. I looked on the Medicaid website and it stated that Lantus and Levemir are covered. I sent in Levemir and will find out which is covered once I am able to speak with them.

## 2021-07-13 NOTE — Telephone Encounter (Signed)
The prescription changed to print and I have sent in a different one and has gone through.

## 2021-07-13 NOTE — Telephone Encounter (Signed)
Informed patient to call the pharmacy and check on this and make sure it is covered since the other was not

## 2021-07-13 NOTE — Telephone Encounter (Signed)
Please see other telephone encounter.

## 2021-07-14 ENCOUNTER — Other Ambulatory Visit (HOSPITAL_COMMUNITY): Payer: Self-pay | Admitting: Hematology

## 2021-07-14 DIAGNOSIS — Z419 Encounter for procedure for purposes other than remedying health state, unspecified: Secondary | ICD-10-CM | POA: Diagnosis not present

## 2021-07-14 NOTE — Telephone Encounter (Signed)
This was previously filled by [name] and was sent to oncology for refill.

## 2021-07-15 ENCOUNTER — Telehealth: Payer: Self-pay

## 2021-07-15 ENCOUNTER — Other Ambulatory Visit: Payer: Self-pay

## 2021-07-15 MED ORDER — FOLIC ACID 1 MG PO TABS: 1.0000 mg | ORAL_TABLET | Freq: Every day | ORAL | 0 refills | Status: DC

## 2021-07-15 MED ORDER — METOPROLOL TARTRATE 25 MG PO TABS: 25.0000 mg | ORAL_TABLET | Freq: Every day | ORAL | 1 refills | Status: DC

## 2021-07-15 NOTE — Telephone Encounter (Signed)
Medication sent.

## 2021-07-15 NOTE — Telephone Encounter (Signed)
Patient called need med refill ? ?metoprolol tartrate (LOPRESSOR) 25 MG tablet  ? ?folic acid (FOLVITE) 1 MG tablet ? ?[facility] ?

## 2021-07-16 ENCOUNTER — Other Ambulatory Visit: Payer: Self-pay

## 2021-07-16 ENCOUNTER — Ambulatory Visit: Payer: Medicaid Other | Admitting: Nurse Practitioner

## 2021-07-16 ENCOUNTER — Inpatient Hospital Stay (HOSPITAL_COMMUNITY): Payer: Medicaid Other | Attending: Hematology

## 2021-07-16 DIAGNOSIS — Z23 Encounter for immunization: Secondary | ICD-10-CM | POA: Diagnosis not present

## 2021-07-16 DIAGNOSIS — C9000 Multiple myeloma not having achieved remission: Secondary | ICD-10-CM | POA: Diagnosis not present

## 2021-07-16 MED ORDER — HEPATITIS B VAC RECOMBINANT 20 MCG/ML IJ SUSP
2.0000 mL | Freq: Once | INTRAMUSCULAR | Status: AC
  Administered 2021-07-16: 40 ug via INTRAMUSCULAR
  Filled 2021-07-16: qty 2

## 2021-07-16 MED ORDER — DTAP-IPV VACCINE IM SUSP
0.5000 mL | Freq: Once | INTRAMUSCULAR | Status: AC
  Administered 2021-07-16: 0.5 mL via INTRAMUSCULAR
  Filled 2021-07-16: qty 0.5

## 2021-07-16 MED ORDER — HAEMOPHILUS B POLYSAC CONJ VAC IM SOLR
0.5000 mL | Freq: Once | INTRAMUSCULAR | Status: AC
  Administered 2021-07-16: 0.5 mL via INTRAMUSCULAR
  Filled 2021-07-16: qty 0.5

## 2021-07-16 MED ORDER — PNEUMOCOCCAL 13-VAL CONJ VACC IM SUSP
0.5000 mL | Freq: Once | INTRAMUSCULAR | Status: AC
  Administered 2021-07-16: 0.5 mL via INTRAMUSCULAR
  Filled 2021-07-16: qty 0.5

## 2021-07-16 NOTE — Progress Notes (Signed)
Per [facility] Vaccine schedule for today: ? ?Kinrix x 1 (DTaP + IPV) ?Hepatitis B 40 mcg x 1 ?HIB x 1 ?Prevnar 13 x 1 ? ?Confirmed with Adonis Huguenin, RN ? ?Henreitta Leber, PharmD ? ?

## 2021-07-16 NOTE — Progress Notes (Signed)
Diamond Robbins presents today for Hep B, DTaP+IPV, HIB, and Prevnar Vaccinations per the provider's orders.  Stable during administration without incident; injection sites WNL; see MAR for injection details.  Patient tolerated procedure well and without incident.  No questions or complaints noted at this time. Discharge from clinic ambulatory in stable condition.  Alert and oriented X 3.  Follow up with [facility] as scheduled.  ?

## 2021-07-16 NOTE — Patient Instructions (Signed)
[facility]  Discharge Instructions: ?Thank you for choosing [facility] to provide your oncology and hematology care.  ?If you have a lab appointment with the [facility], please come in thru the Main Entrance and check in at the main information desk. ? ?Wear comfortable clothing and clothing appropriate for easy access to any Portacath or PICC line.  ? ?We strive to give you quality time with your provider. You may need to reschedule your appointment if you arrive late (15 or more minutes).  Arriving late affects you and other patients whose appointments are after yours.  Also, if you miss three or more appointments without notifying the office, you may be dismissed from the clinic at the provider?s discretion.    ?  ?For prescription refill requests, have your pharmacy contact our office and allow 72 hours for refills to be completed.   ? ?Today you received the following chemotherapy and/or immunotherapy agents Vaccinations    ?  ?To help prevent nausea and vomiting after your treatment, we encourage you to take your nausea medication as directed. ? ?BELOW ARE SYMPTOMS THAT SHOULD BE REPORTED IMMEDIATELY: ?*FEVER GREATER THAN 100.4 F (38 ?C) OR HIGHER ?*CHILLS OR SWEATING ?*NAUSEA AND VOMITING THAT IS NOT CONTROLLED WITH YOUR NAUSEA MEDICATION ?*UNUSUAL SHORTNESS OF BREATH ?*UNUSUAL BRUISING OR BLEEDING ?*URINARY PROBLEMS (pain or burning when urinating, or frequent urination) ?*BOWEL PROBLEMS (unusual diarrhea, constipation, pain near the anus) ?TENDERNESS IN MOUTH AND THROAT WITH OR WITHOUT PRESENCE OF ULCERS (sore throat, sores in mouth, or a toothache) ?UNUSUAL RASH, SWELLING OR PAIN  ?UNUSUAL VAGINAL DISCHARGE OR ITCHING  ? ?Items with * indicate a potential emergency and should be followed up as soon as possible or go to the Emergency Department if any problems should occur. ? ?Please show the CHEMOTHERAPY ALERT CARD or IMMUNOTHERAPY ALERT CARD at check-in to the Emergency  Department and triage nurse. ? ?Should you have questions after your visit or need to cancel or reschedule your appointment, please contact [facility] [phone]  and follow the prompts.  Office hours are 8:00 a.m. to 4:30 p.m. Monday - Friday. Please note that voicemails left after 4:00 p.m. may not be returned until the following business day.  We are closed weekends and major holidays. You have access to a nurse at all times for urgent questions. Please call the main number to the clinic [phone] and follow the prompts. ? ?For any non-urgent questions, you may also contact your provider using MyChart. We now offer e-Visits for anyone 43 and older to request care online for non-urgent symptoms. For details visit [email or web address]. ?  ?Also download the MyChart app! Go to the app store, search "MyChart", open the app, select [facility], and log in with your MyChart username and password. ? ?Due to Covid, a mask is required upon entering the hospital/clinic. If you do not have a mask, one will be given to you upon arrival. For doctor visits, patients may have 1 support person aged 13 or older with them. For treatment visits, patients cannot have anyone with them due to current Covid guidelines and our immunocompromised population.  ?

## 2021-07-21 ENCOUNTER — Ambulatory Visit: Payer: Medicaid Other | Admitting: Nutrition

## 2021-07-21 ENCOUNTER — Ambulatory Visit: Payer: Medicaid Other | Admitting: Nurse Practitioner

## 2021-07-21 NOTE — Patient Instructions (Incomplete)
Diabetes Mellitus Emergency Preparedness Plan ?A diabetes emergency preparedness plan is a checklist to make sure you have everything you need to manage your diabetes in case of an emergency, such as an evacuation, natural disaster, national security emergency, or pandemic lockdown. ?Managing your diabetes is something you have to do all day every day. The American Diabetes Association and the American College of Endocrinology both recommend putting together an emergency diabetes kit. Your kit should include important information and documents as well as all the supplies you will need to manage your diabetes for at least 1 week. Store it in a portable, waterproof bag or container. The best time to start making your emergency kit is now. ?How to make your emergency kit ?Collect information and documents ?Include the following information and documents in your kit: ?The type of diabetes you have. ?A copy of your health insurance cards and photo ID. ?A list of all your other medical conditions, allergies, and surgeries. ?A list of all your medicines and doses with the contact information for your pharmacy. Ask your health care provider for a list of your current medicines. ?Any recent lab results, including your latest hemoglobin A1C (HbA1C). ?The make, model, and serial number of your insulin pump, if you use one. Also include contact information for the manufacturer. ?Contact information for people who should be notified in case of an emergency. Include your health care provider's name, address, and phone number. ?Collect diabetes care items ?Include the following diabetes care items in your kit: ?At least a 1-week supply of: ?Oral medicines. ?Insulin. ?Blood glucose testing supplies. These include testing strips, lancets, and extra batteries for your blood glucose monitor and pump. ?A charger for the continuous glucose monitor (CGM) receiver and pump. ?Any extra supplies needed for your CGM or pump. ?A supply of  glucagon, glucose tablets, juice, soda, or hard candy in case of hypoglycemia. ?Coolers or cold packs. ?A safe container for syringes, needles, and lancets. ? ?Other preparations ?Other things to consider doing as part of your emergency plan: ?Make sure that your mobile phone is charged and that you have an extra charger, cable, or batteries. ?Choose a meeting place for family members. ?Wear a medical alert or ID bracelet. ?If you have a child with diabetes, make sure your child's school has a copy of his or her emergency plan, including the name of the staff member who will assist your child. ?Where to find more information ?American Diabetes Association: www.diabetes.org ?Centers for Disease Control and Prevention: blogs.cdc.gov ?Summary ?A diabetes emergency preparedness plan is a checklist to make sure you have everything you need in case of an emergency. ?Your kit should include important information and documents as well as all the supplies you will need to manage your condition for at least 1 week. ?Store your kit in a portable, waterproof bag or container. ?The best time to start making your emergency kit is now. ?This information is not intended to replace advice given to you by your health care provider. Make sure you discuss any questions you have with your health care provider. ?Document Revised: 11/07/2019 Document Reviewed: 11/07/2019 ?Elsevier Patient Education ? 2022 Elsevier Inc. ? ?

## 2021-07-22 ENCOUNTER — Other Ambulatory Visit: Payer: Self-pay

## 2021-07-22 ENCOUNTER — Encounter: Payer: Medicaid Other | Attending: Nurse Practitioner | Admitting: Nutrition

## 2021-07-22 VITALS — Ht 63.0 in | Wt 180.0 lb

## 2021-07-22 DIAGNOSIS — I10 Essential (primary) hypertension: Secondary | ICD-10-CM | POA: Diagnosis not present

## 2021-07-22 DIAGNOSIS — E785 Hyperlipidemia, unspecified: Secondary | ICD-10-CM | POA: Diagnosis not present

## 2021-07-22 DIAGNOSIS — E1010 Type 1 diabetes mellitus with ketoacidosis without coma: Secondary | ICD-10-CM | POA: Insufficient documentation

## 2021-07-22 DIAGNOSIS — E669 Obesity, unspecified: Secondary | ICD-10-CM | POA: Insufficient documentation

## 2021-07-22 DIAGNOSIS — C9000 Multiple myeloma not having achieved remission: Secondary | ICD-10-CM | POA: Insufficient documentation

## 2021-07-22 DIAGNOSIS — E1069 Type 1 diabetes mellitus with other specified complication: Secondary | ICD-10-CM | POA: Diagnosis not present

## 2021-07-22 NOTE — Progress Notes (Unsigned)
Medical Nutrition Therapy  Appointment Start time:  0930  Appointment End time:  82  Primary concerns today: Diabetes Type 1  Referral diagnosis: E10.8 Preferred learning style: Viscual and reading  Learning readiness: Change in progress )   NUTRITION ASSESSMENT  Has an acchuchek meter. FBS 267 this am, and yesterday am 150, Before lunch 190 mg/dl,  Before dinner 140 Bedtime 167 mg/dl.     Anthropometrics  Wt Readings from Last 3 Encounters:  07/16/21 183 lb (83 kg)  06/24/21 183 lb (83 kg)  06/21/21 181 lb (82.1 kg)   Ht Readings from Last 3 Encounters:  06/24/21 5' 5.5" (1.664 m)  06/21/21 '5\' 2"'$  (1.575 m)  05/25/21 5' 5.5" (1.664 m)   There is no height or weight on file to calculate BMI. '@BMIFA'$ @ Facility age limit for growth percentiles is 20 years. Facility age limit for growth percentiles is 20 years.    Clinical Medical Hx: Type 1 DM Dx in 2021. Mulitiple Myeloma Medications:  Labs:  Lab Results  Component Value Date   HGBA1C 10.0 (H) 06/21/2021   CMP Latest Ref Rng & Units 05/25/2021 03/16/2021 03/01/2021  Glucose 70 - 99 mg/dL 251(H) 164(H) 191(H)  BUN 6 - 20 mg/dL '14 12 12  '$ Creatinine 0.44 - 1.00 mg/dL 0.61 0.60 0.62  Sodium 135 - 145 mmol/L 138 138 136  Potassium 3.5 - 5.1 mmol/L 3.9 3.6 3.5  Chloride 98 - 111 mmol/L 106 103 105  CO2 22 - 32 mmol/L '24 25 24  '$ Calcium 8.9 - 10.3 mg/dL 9.2 9.4 9.2  Total Protein 6.5 - 8.1 g/dL 7.5 7.8 7.6  Total Bilirubin 0.3 - 1.2 mg/dL 0.7 0.5 0.7  Alkaline Phos 38 - 126 U/L 83 74 77  AST 15 - 41 U/L 38 26 31  ALT 0 - 44 U/L 60(H) 35 49(H)    Notable Signs/Symptoms: NOne  Lifestyle & Dietary Hx Lives with her husband. 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, running back coach,   24-Hr Dietary Recall First Meal: HOney bunches of oats, water Snack:   Second Meal: Chicken salad and crackers, water Snack: Photographer Third Meal: baked chicken legs, potatoes/onions, water, propel Snack: bite of starcrunch Beverages: water  Estimated Energy Needs Calories: *** Carbohydrate: ***g Protein: ***g Fat: ***g   NUTRITION DIAGNOSIS  {CHL AMB NUTRITIONAL DIAGNOSIS:[id number]}   NUTRITION INTERVENTION  Nutrition education (E-1) on the following topics:  ***  Handouts Provided Include  ***  Learning Style & Readiness for Change Teaching method utilized: Visual & Auditory  Demonstrated degree of understanding via: Teach Back  Barriers to learning/adherence to lifestyle change: ***  Goals Established by Pt ***   MONITORING & EVALUATION Dietary intake, weekly physical activity, and *** in ***.  Next Steps  Patient is to ***.

## 2021-07-22 NOTE — Patient Instructions (Signed)
Goals  Plan and prepare meals at home and take with you if going out to games etc. Portion foods out Eat meals on time Eat 2-3 carb choices per meal  Focus more on plant based foods Drink only water Cut out tea, and other artificial sweetners. Avoid processed foods and junk food Get A1C down to 7% Exercise  30 minutes a day-150 minutes a week.

## 2021-07-26 ENCOUNTER — Ambulatory Visit: Payer: Medicaid Other | Admitting: Nutrition

## 2021-07-26 ENCOUNTER — Ambulatory Visit: Payer: Medicaid Other | Admitting: Nurse Practitioner

## 2021-07-26 NOTE — Patient Instructions (Incomplete)

## 2021-07-27 ENCOUNTER — Telehealth: Payer: Self-pay | Admitting: Nurse Practitioner

## 2021-07-27 ENCOUNTER — Inpatient Hospital Stay (HOSPITAL_COMMUNITY): Payer: Medicaid Other

## 2021-07-27 ENCOUNTER — Other Ambulatory Visit: Payer: Self-pay

## 2021-07-27 DIAGNOSIS — C9000 Multiple myeloma not having achieved remission: Secondary | ICD-10-CM

## 2021-07-27 DIAGNOSIS — Z23 Encounter for immunization: Secondary | ICD-10-CM | POA: Diagnosis not present

## 2021-07-27 LAB — PREGNANCY, URINE: Preg Test, Ur: NEGATIVE

## 2021-07-27 MED ORDER — ACCU-CHEK GUIDE VI STRP: ORAL_STRIP | 0 refills | Status: DC

## 2021-07-27 MED ORDER — ACCU-CHEK SOFTCLIX LANCETS MISC: 0 refills | Status: DC

## 2021-07-27 NOTE — Telephone Encounter (Signed)
Strips and lancets have been sent to the pharmacy requested. ?

## 2021-07-27 NOTE — Telephone Encounter (Signed)
Pt called requesting a refill on test strips and lancets. Pt has accucheck guide me meter ? ?[place], [place] - [id number] [place] [address] [place] Phone:  [phone]  ?Fax:  [phone]  ?  ? ?

## 2021-07-29 ENCOUNTER — Other Ambulatory Visit (HOSPITAL_COMMUNITY): Payer: Self-pay

## 2021-07-29 MED ORDER — LENALIDOMIDE 10 MG PO CAPS: ORAL_CAPSULE | ORAL | 0 refills | Status: DC

## 2021-07-29 NOTE — Telephone Encounter (Signed)
Chart reviewed. Revlimid refilled per last office note with Dr. Delton Coombes. Negative pregnancy faxed to [facility] ?

## 2021-07-30 ENCOUNTER — Telehealth: Payer: Self-pay

## 2021-07-30 ENCOUNTER — Other Ambulatory Visit: Payer: Self-pay | Admitting: Pharmacist

## 2021-07-30 ENCOUNTER — Other Ambulatory Visit: Payer: Self-pay

## 2021-07-30 DIAGNOSIS — E1010 Type 1 diabetes mellitus with ketoacidosis without coma: Secondary | ICD-10-CM

## 2021-07-30 DIAGNOSIS — I10 Essential (primary) hypertension: Secondary | ICD-10-CM

## 2021-07-30 MED ORDER — INSULIN GLARGINE 100 UNIT/ML SOLOSTAR PEN: 30.0000 [IU] | PEN_INJECTOR | Freq: Every day | SUBCUTANEOUS | 2 refills | Status: DC

## 2021-07-30 MED ORDER — UNABLE TO FIND: 0 refills | Status: DC

## 2021-07-30 NOTE — Telephone Encounter (Signed)
Called pt to let her know that her prescription is ready to be picked up per catie travis pharmacist no answer left vm ?

## 2021-07-30 NOTE — Patient Outreach (Signed)
? ?   ? ?Chief Complaint  ?Patient presents with  ? Diabetes  ? ? ?[facility] is a 31 y.o. year old female who was referred for medication management by their primary care provider, [facility], Dewaine Conger, FNP. They presented for a virtual visit in the context of the COVID-19 pandemic. ?  ? ?Subjective: ?Diabetes: ? ?Diagnosis: T1DM -> diagnosed at 28 at outside hospital [facility] in [place]); she was driving through with husband when she became lethargic and unresponsive. Glucose >700 ant A1c 12.8 at that time. Per hospital documentation, patient had reported a previous miscarriage at 10 weeks and gestational diabetes at that time. I was unable to find evidence of T1DM work up (insulin autoantibodies, GADA, c-peptide, etc).  ? ?Current medications: Levemir 30 units QPM ?Medications tried in the past: Lantus - was switched to Levemir when Medicaid PDL updated, appears [facility] did not dispense generic  ? ?Current glucose readings:  ?- Before breakfast: 210 ?- Before lunch: 100s ?- Before supper: 200s ? ?Using Accu Chek meter; testing 3-4 times daily ? ?Patient denies hypoglycemic s/sx including dizziness, shakiness, sweating consistent with hypoglycemia. Reports dietary changes prior to last A1c resulted in increase to 10% ? ?Current meal patterns:  ?- Breakfast: pancakes (2), sausage, eggs, water  ?- Lunch: meatloaf, green beans, mac and cheese; lately has been eating out, hasn't been home as much; if she was going to cook - [place] soup, sandwich w/ deli meats; yogurt and granola;  ?- Supper: reports it is easier to follow guidelines for dinner; baked chicken, vegetable, rice; yesterday had chicken pot pie ?- Snacks: handful of popcorn, sunflower seeds ?- Drinks: water  ? 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 ?Hypertension: ? ?Current medications: amlodipine 10 mg daily, metoprolol tartrate 25 mg once  daily ? ?Current blood pressure readings readings: not checking at home, does not have a machine ? ?Hyperlipidemia/ASCVD Risk Reduction ? ?Current lipid lowering medications: rosuvastatin 10 mg daily ?Medications tried in the past:  ? ?Antiplatelet regimen: aspirin 81 mg daily per hematology ? ?Multiple Myeloma: ? ?Current treatment: Revilimid 10 mg daily x 3 weeks on/1 week off; calcium daily, vitamin D daily, Zometa Q12 weeks with cancer center ? ? ? ?Objective: ?Lab Results  ?Component Value Date  ? HGBA1C 10.0 (H) 06/21/2021  ? ? ?Lab Results  ?Component Value Date  ? CREATININE 0.61 05/25/2021  ? BUN 14 05/25/2021  ? NA 138 05/25/2021  ? K 3.9 05/25/2021  ? CL 106 05/25/2021  ? CO2 24 05/25/2021  ? ? ?Lab Results  ?Component Value Date  ? CHOL 158 06/21/2021  ? HDL 62 06/21/2021  ? [place] 74 06/21/2021  ? TRIG 125 06/21/2021  ? CHOLHDL 2.5 06/21/2021  ? ? ?Medications Reviewed Today   ? ? Reviewed by De Hollingshead, RPH-CPP (Pharmacist) on 07/30/21 at [phone]  Med List Status: <None>  ? ?Medication Order Taking? Sig Documenting Provider Last Dose Status Informant  ?ACCU-CHEK GUIDE test strip [id number] Yes Use to check blood glucose 4 times daily. Brita Romp, NP Taking Active   ?Accu-Chek Softclix Lancets lancets [id number] Yes Use to check blood glucose 4 times daily. Brita Romp, NP Taking Active   ?acyclovir (ZOVIRAX) 800 MG tablet [id number] Yes Take 800 mg by mouth daily. [provider] Taking Active   ?amLODipine (NORVASC) 10 MG tablet [id number] Yes Take 1 tablet by mouth once daily [facility],  Dewaine Conger, FNP Taking Active   ?Aspirin 81 MG CAPS [id number] Yes Take 81 mg by mouth daily. [provider] Taking Active   ?Blood Glucose Monitoring Suppl (ACCU-CHEK GUIDE) w/Device KIT [id number] Yes  [provider] Taking Active Self  ?calcium carbonate (OS-CAL) 1250 (500 Ca) MG chewable tablet [id number] Yes Chew 1 tablet by mouth daily. [provider] Taking  Active   ?cetirizine (ZYRTEC) 10 MG tablet [id number] Yes Take 1 tablet (10 mg total) by mouth daily. Renee Rival, FNP Taking Active   ?cholecalciferol (VITAMIN D3) 25 MCG (1000 UNIT) tablet [id number] Yes Take 1,000 Units by mouth daily. [provider] Taking Active   ?fluticasone (FLONASE) 50 MCG/ACT nasal spray [id number] Yes Place 2 sprays into both nostrils daily. Renee Rival, FNP Taking Active   ?folic acid (FOLVITE) 1 MG tablet [id number] Yes Take 1 tablet (1 mg total) by mouth daily. Renee Rival, FNP Taking Active   ?insulin detemir (LEVEMIR FLEXPEN) 100 UNIT/ML FlexPen [id number] Yes Inject 30 Units into the skin at bedtime. Cassandria Anger, MD Taking Active   ?lenalidomide (REVLIMID) 10 MG capsule [id number] Yes Take 1 capsule daily for 21 days, then take 7 days off [name], MD Taking Active   ?metoprolol tartrate (LOPRESSOR) 25 MG tablet [id number] Yes Take 1 tablet (25 mg total) by mouth daily. Renee Rival, FNP Taking Active   ?Multiple Vitamin (MULTIVITAMIN) tablet [id number] Yes Take 1 tablet by mouth daily. [provider] Taking Active   ?rosuvastatin (CRESTOR) 10 MG tablet [id number] Yes Take 1 tablet (10 mg total) by mouth daily. Renee Rival, FNP Taking Active   ?Med List Note [name], CPhT 03/07/13 1442): No preferred pharmacy.   ? ?  ?  ? ?  ? ? ?Assessment/Plan:  ? ?Care Plan : Medication Management  ?Updates made by De Hollingshead, RPH-CPP since 07/30/2021 12:00 AM  ?  ? ?Problem: Diabetes   ?  ? ?Long-Range Goal: A1c <7%   ?Note:   ?Current Barriers:  ?Unable to independently monitor therapeutic efficacy ?Unable to achieve control of diabetes  ? ?Patient Needs: ?Consideration for CGM ?Collaboration with team to optimize medication regimen ? ?Patient Activities: ?Patient will:  ?- take medications as prescribed as evidenced by patient report and record review ?check glucose at least 4 times daily, document,  and provide at future appointments ? ?  ? ?Problem: Cardiovascular Risk Reduction   ?  ? ?Long-Range Goal: BP <130/80; LDL <70   ?Note:   ?Current Barriers:  ?Unable to achieve control of lipids  ?Suboptimal therapeutic regimen for hypertension ? ?Patient Needs: ?Collaboration with care providers ?Working blood pressure meter ? ?Patient Activities: ?Patient will:  ?- check blood pressure periodically, document, and provide at future appointments ?target a minimum of 150 minutes of moderate intensity exercise weekly ? ? ?  ? ? ? ?Diabetes: ?- Currently uncontrolled ?- Reviewed long term cardiovascular and renal outcomes of uncontrolled blood sugar ?- Reviewed goal A1c, goal fasting, and goal 2 hour post prandial glucose ?- Reviewed dietary modifications including: continued collaboration with dietician ?- Reviewed lifestyle modifications including: target goal of 150 minutes weekly of moderate intensity physical activity ?- Discussed pharmacokinetics of Levemir vs Lantus. Generic insulin glargine is on Medicaid formulary. Will collaborate with endocrinology and primary care provider to consider switch back to insulin glargine when patient completes current supply ?- Medicaid will cover CGM on 08/14/21 with 1 dose of insulin daily. Will discuss this with team  at that time ?- Recommend work up for insulin autoantibodies and c-peptide to confirm diagnosis of insulin-dependent diabetes to evaluate if role for non-insulin therapies ? ? ?Hypertension: ?- Currently uncontrolled per last office visit ?- [facility]	[place]. They cannot bill BP machines. Called another local independent pharmacy, [facility]. They can bill Medicaid for BP machines with a provider order.  ?- Encouraged patient to evaluate blood pressure daily, alternating between morning and afternoon, to evaluate efficacy of current regimen. Patient does not have a compelling indication for use of beta blocker. Consider d/c and/or adjustment to  diuretic if additional therapy needed ? ?Hyperlipidemia/ASCVD Risk Reduction: ?- Currently uncontrolled.  ?- Continue current regimen at this time. Follow for need to adjust dose moving forward.  ? ?Multiple M

## 2021-07-30 NOTE — Patient Outreach (Signed)
Care Coordination ? ?07/30/2021 ? ?Sadi Borys ?1990-12-03 ?[id number] ? ?Script for generic insulin glargine 30 units daily placed under [name], NP as requested.  ? ?Called [facility] to ensure generic was ran appropriately. They were running the wrong NDC, but we eventually figured out the [facility] Medicaid preferred [facility] for insulin glargine pen for them to run.  ? ?[place] - [phone], [place] brand is preferred NDC.  ? ?They will fill for the patient today. Awaiting communication back from PCP regarding BP cuff script.  ? ?Catie Darnelle Maffucci, PharmD, [place], CPP ?Clinical Pharmacist ?Therapist, music at [facility] [employer] ?[phone] ? ?

## 2021-07-30 NOTE — Patient Instructions (Signed)
Hi Golda,  ? ?Thanks for chatting today! ? ?Check your blood sugars 3-4 times daily, including:  ?1) Fasting, first thing in the morning before breakfast and  ?2) 2 hours after your largest meal.  ? ?For a goal A1c of less than 7%, goal fasting readings are less than 130 and goal 2 hour after meal readings are less than 180. I'll talk to your endocrinologist about switching back to Lantus, but the generic version that is covered by Medicaid.  ? ?After April 1, Medicaid will cover continuous glucose monitors. I think this could be a beneficial piece of technology for you.  ? ?Our goal blood pressure is less than 130/80. I'll send you a separate message about getting a BP machine covered by Medicaid.  ? ?Keep up the great work! ? ?Catie Darnelle Maffucci, PharmD, BCACP ?[facility] ? ?

## 2021-07-30 NOTE — Addendum Note (Signed)
Addended by: Alphonzo Severance E on: 07/30/2021 12:00 PM ? ? Modules accepted: Orders ? ?

## 2021-07-30 NOTE — Progress Notes (Signed)
Working on this awaiting signature ?

## 2021-08-03 ENCOUNTER — Ambulatory Visit: Payer: Medicaid Other | Admitting: Nurse Practitioner

## 2021-08-04 ENCOUNTER — Telehealth: Payer: Self-pay | Admitting: Pharmacist

## 2021-08-04 NOTE — Patient Outreach (Signed)
Care Coordination ? ?08/04/2021 ? ?[facility] ?Jun 25, 1990 ?[id number] ? ? ?Called patient to follow up on obtaining BP machine script. She had to reschedule her PCP appointment yesterday, but has a nurse visit next week so plans to pick up the hard copy script at that time. Reviewed taking script to [place] [facility] DME billing.  ? ?Reviewed upcoming endocrinology appointment. Reviewed our follow up on 4/4.  ? ?Patient denies any other questions at this time.  ? ?Catie Darnelle Maffucci, PharmD, BCACP ?[facility] ? ?

## 2021-08-09 ENCOUNTER — Inpatient Hospital Stay (HOSPITAL_COMMUNITY): Payer: Medicaid Other

## 2021-08-09 ENCOUNTER — Encounter: Payer: Self-pay | Admitting: Nutrition

## 2021-08-10 ENCOUNTER — Other Ambulatory Visit (HOSPITAL_COMMUNITY): Payer: Medicaid Other

## 2021-08-10 ENCOUNTER — Ambulatory Visit: Payer: Medicaid Other

## 2021-08-11 ENCOUNTER — Other Ambulatory Visit: Payer: Self-pay | Admitting: Nurse Practitioner

## 2021-08-11 ENCOUNTER — Other Ambulatory Visit: Payer: Self-pay

## 2021-08-11 ENCOUNTER — Encounter: Payer: Medicaid Other | Attending: Nurse Practitioner | Admitting: Nutrition

## 2021-08-11 ENCOUNTER — Ambulatory Visit (INDEPENDENT_AMBULATORY_CARE_PROVIDER_SITE_OTHER): Payer: Medicaid Other | Admitting: Nurse Practitioner

## 2021-08-11 ENCOUNTER — Encounter: Payer: Self-pay | Admitting: Nutrition

## 2021-08-11 ENCOUNTER — Inpatient Hospital Stay (HOSPITAL_COMMUNITY): Payer: Medicaid Other

## 2021-08-11 ENCOUNTER — Encounter: Payer: Self-pay | Admitting: Nurse Practitioner

## 2021-08-11 VITALS — Ht 63.0 in | Wt 177.0 lb

## 2021-08-11 VITALS — BP 125/88 | HR 86 | Ht 65.5 in | Wt 177.4 lb

## 2021-08-11 DIAGNOSIS — E1069 Type 1 diabetes mellitus with other specified complication: Secondary | ICD-10-CM | POA: Insufficient documentation

## 2021-08-11 DIAGNOSIS — J019 Acute sinusitis, unspecified: Secondary | ICD-10-CM

## 2021-08-11 DIAGNOSIS — E669 Obesity, unspecified: Secondary | ICD-10-CM | POA: Diagnosis not present

## 2021-08-11 DIAGNOSIS — E1065 Type 1 diabetes mellitus with hyperglycemia: Secondary | ICD-10-CM

## 2021-08-11 DIAGNOSIS — I10 Essential (primary) hypertension: Secondary | ICD-10-CM | POA: Diagnosis not present

## 2021-08-11 DIAGNOSIS — E782 Mixed hyperlipidemia: Secondary | ICD-10-CM

## 2021-08-11 DIAGNOSIS — E1010 Type 1 diabetes mellitus with ketoacidosis without coma: Secondary | ICD-10-CM

## 2021-08-11 DIAGNOSIS — D472 Monoclonal gammopathy: Secondary | ICD-10-CM

## 2021-08-11 DIAGNOSIS — Z23 Encounter for immunization: Secondary | ICD-10-CM | POA: Diagnosis not present

## 2021-08-11 DIAGNOSIS — C9000 Multiple myeloma not having achieved remission: Secondary | ICD-10-CM | POA: Diagnosis not present

## 2021-08-11 DIAGNOSIS — E785 Hyperlipidemia, unspecified: Secondary | ICD-10-CM | POA: Diagnosis not present

## 2021-08-11 LAB — CBC WITH DIFFERENTIAL/PLATELET
Abs Immature Granulocytes: 0.02 10*3/uL (ref 0.00–0.07)
Basophils Absolute: 0.1 10*3/uL (ref 0.0–0.1)
Basophils Relative: 1 %
Eosinophils Absolute: 0.2 10*3/uL (ref 0.0–0.5)
Eosinophils Relative: 6 %
HCT: 43.6 % (ref 36.0–46.0)
Hemoglobin: 13.8 g/dL (ref 12.0–15.0)
Immature Granulocytes: 1 %
Lymphocytes Relative: 36 %
Lymphs Abs: 1.3 10*3/uL (ref 0.7–4.0)
MCH: 27.4 pg (ref 26.0–34.0)
MCHC: 31.7 g/dL (ref 30.0–36.0)
MCV: 86.5 fL (ref 80.0–100.0)
Monocytes Absolute: 0.2 10*3/uL (ref 0.1–1.0)
Monocytes Relative: 6 %
Neutro Abs: 1.8 10*3/uL (ref 1.7–7.7)
Neutrophils Relative %: 50 %
Platelets: 325 10*3/uL (ref 150–400)
RBC: 5.04 MIL/uL (ref 3.87–5.11)
RDW: 14.1 % (ref 11.5–15.5)
WBC: 3.6 10*3/uL — ABNORMAL LOW (ref 4.0–10.5)
nRBC: 0 % (ref 0.0–0.2)

## 2021-08-11 LAB — COMPREHENSIVE METABOLIC PANEL
ALT: 33 U/L (ref 0–44)
AST: 24 U/L (ref 15–41)
Albumin: 4.4 g/dL (ref 3.5–5.0)
Alkaline Phosphatase: 96 U/L (ref 38–126)
Anion gap: 9 (ref 5–15)
BUN: 12 mg/dL (ref 6–20)
CO2: 23 mmol/L (ref 22–32)
Calcium: 9.5 mg/dL (ref 8.9–10.3)
Chloride: 104 mmol/L (ref 98–111)
Creatinine, Ser: 0.63 mg/dL (ref 0.44–1.00)
GFR, Estimated: >60 mL/min (ref >60)
Glucose, Bld: 271 mg/dL — ABNORMAL HIGH (ref 70–99)
Potassium: 3.8 mmol/L (ref 3.5–5.1)
Sodium: 136 mmol/L (ref 135–145)
Total Bilirubin: 0.4 mg/dL (ref 0.3–1.2)
Total Protein: 8.3 g/dL — ABNORMAL HIGH (ref 6.5–8.1)

## 2021-08-11 LAB — LACTATE DEHYDROGENASE: LDH: 136 U/L (ref 98–192)

## 2021-08-11 LAB — MAGNESIUM: Magnesium: 2 mg/dL (ref 1.7–2.4)

## 2021-08-11 MED ORDER — NOVOLOG FLEXPEN 100 UNIT/ML ~~LOC~~ SOPN: 5.0000 [IU] | PEN_INJECTOR | Freq: Three times a day (TID) | SUBCUTANEOUS | 3 refills | Status: DC

## 2021-08-11 MED ORDER — DEXCOM G6 SENSOR MISC: 3 refills | Status: DC

## 2021-08-11 MED ORDER — INSULIN GLARGINE 100 UNIT/ML SOLOSTAR PEN: 20.0000 [IU] | PEN_INJECTOR | Freq: Every day | SUBCUTANEOUS | 2 refills | Status: DC

## 2021-08-11 MED ORDER — DEXCOM G6 TRANSMITTER MISC: 3 refills | Status: DC

## 2021-08-11 NOTE — Patient Instructions (Signed)

## 2021-08-11 NOTE — Patient Instructions (Signed)
Goals ? ?Increase more fruits, vegetables and plant based foods ?Increase exercise to walking 30 minutes a day ?Take novolog with meals and sliding scale. Take 20 units of Levermir at night. ? ?

## 2021-08-11 NOTE — Progress Notes (Signed)
Medical Nutrition Therapy  ?Appointment Start time:  3267  Appointment End time:  1100 ?Primary concerns today: Diabetes Type 1  ?Referral diagnosis: E10.8 ?Preferred learning style: Viscual and reading  ?Learning readiness: Change in progress ) ? ? ?NUTRITION ASSESSMENT  ?Diamond Pigg, FNP today. ?Changes made: Has been drinking only water. Better with portion control. Trying to eat more fruit  and more lower carb vegetables.and less bread. ?Lost 3 lbs. Feel better. ?20 units of Levemir daily. And Novolog 5 plus sliding scale-started today by Diamond Pigg, FNP ? ?BS" have been running 200-230's. ? ?Got the dexcom to start wearing today. Her husband will help her put in on. ? ?She feels better. Sleeping better. Has been walking a lot more during the day.  ? ?Has Multiple Myeloma and gets monthly injections. In remission. ? ?Anthropometrics  ?Wt Readings from Last 3 Encounters:  ?08/11/21 177 lb 6.4 oz (80.5 kg)  ?07/22/21 180 lb (81.6 kg)  ?07/16/21 183 lb (83 kg)  ? ?Ht Readings from Last 3 Encounters:  ?08/11/21 5' 5.5" (1.664 m)  ?07/22/21 5' 3" (1.6 m)  ?06/24/21 5' 5.5" (1.664 m)  ? ?There is no height or weight on file to calculate BMI. ?_0 @ ?Facility age limit for growth percentiles is 20 years. ?Facility age limit for growth percentiles is 20 years. ?  ? ?Clinical ?Medical Hx: Type 1 DM Dx in 2021. Mulitiple Myeloma ?Medications:  ?Labs:  ?Lab Results  ?Component Value Date  ? HGBA1C 10.0 (H) 06/21/2021  ? ? ?  Latest Ref Rng & Units 08/11/2021  ?  8:54 AM 05/25/2021  ?  9:09 AM 03/16/2021  ?  8:29 AM  ?CMP  ?Glucose 70 - 99 mg/dL 271   251   164    ?BUN 6 - 20 mg/dL _1 ?Creatinine 0.44 - 1.00 mg/dL 0.63   0.61   0.60    ?Sodium 135 - 145 mmol/L 136   138   138    ?Potassium 3.5 - 5.1 mmol/L 3.8   3.9   3.6    ?Chloride 98 - 111 mmol/L 104   106   103    ?CO2 22 - 32 mmol/L _2 ?Calcium 8.9 - 10.3 mg/dL 9.5   9.2   9.4    ?Total Protein 6.5 - 8.1 g/dL 8.3   7.5   7.8    ?Total  Bilirubin 0.3 - 1.2 mg/dL 0.4   0.7   0.5    ?Alkaline Phos 38 - 126 U/L 96   83   74    ?AST 15 - 41 U/L 24   38   26    ?ALT 0 - 44 U/L 33   60   35    ? ? ?Notable Signs/Symptoms: NOne ? ?Lifestyle & Dietary Hx ?Lives with her husband. 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  ? 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  ? ?24-Hr Dietary Recall ?First Meal: 2 eggs on bun, grapes 10, water ?Snack:  ?Second Meal: broccoli, chicken and cheese casserole, water ?Snack:  ?Third Meal: Pizza sausage and pepperoni-2,  water ?Beverages: water ? ?Estimated Energy Needs ?Calories: 1200 ?Carbohydrate: 135g ?Protein: 90g ?Fat: 33g ? ? ?NUTRITION DIAGNOSIS  ?NB-1.1 Food and nutrition-related knowledge deficit As related  to Diabetes Type 2.  As evidenced by A1C 10%.. ? ? ?NUTRITION INTERVENTION  ?Nutrition education (E-1) on the following topics:  ?Nutrition and Diabetes education provided on My Plate, CHO counting, meal planning, portion sizes, timing of meals, avoiding snacks between meals unless having a low blood sugar, target ranges for A1C and blood sugars, signs/symptoms and treatment of hyper/hypoglycemia, monitoring blood sugars, taking medications as prescribed, benefits of exercising 30 minutes per day and prevention of complications of DM. ?Lifestyle Medicine ?- Whole Food, Plant Predominant Nutrition is highly recommended: Eat Plenty of vegetables, Mushrooms, fruits, Legumes, Whole Grains, Nuts, seeds in lieu of processed meats, processed snacks/pastries red meat, poultry, eggs.  ?  ?-It is better to avoid simple carbohydrates including: Cakes, Sweet Desserts, Ice Cream, Soda (diet and regular), Sweet Tea, Candies, Chips, Cookies, Store Bought Juices, Alcohol in Excess of  1-2 drinks a day, Lemonade,  Artificial Sweeteners, Doughnuts, Coffee Creamers, "Sugar-free" Products,  etc, etc.  This is not a complete list..... ? ?Exercise: If you are able: 30 -60 minutes a day ,4 days a week, or 150 minutes a week.  The longer the better.  Combine stretch, strength, and aerobic activities.  If you were told in the past that you have high risk for cardiovascular diseases, you may seek evaluation by your heart doctor prior to initiating moderate to intense exercise programs. ? ? ? ?Handouts Provided Include  ?Grocery list for plant based foods. ? ?Learning Style & Readiness for Change ?Teaching method utilized: Visual & Auditory  ?Demonstrated degree of understanding via: Teach Back  ?Barriers to learning/adherence to lifestyle change: non3 ? ?Goals Established by Pt ?Goals ? ?Increase more fruits, vegetables and plant based foods ?Increase exercise to walking 30 minutes a day ?Take novolog with meals and sliding scale. Take 20 units of Levermir at night. ? ? ?Lifestyle Medicine ?- Whole Food, Plant Predominant Nutrition is highly recommended: Eat Plenty of vegetables, Mushrooms, fruits, Legumes, Whole Grains, Nuts, seeds in lieu of processed meats, processed snacks/pastries red meat, poultry, eggs.  ?  ?-It is better to avoid simple carbohydrates including: Cakes, Sweet Desserts, Ice Cream, Soda (diet and regular), Sweet Tea, Candies, Chips, Cookies, Store Bought Juices, Alcohol in Excess of  1-2 drinks a day, Lemonade,  Artificial Sweeteners, Doughnuts, Coffee Creamers, "Sugar-free" Products, etc, etc.  This is not a complete list..... ? ?Exercise: If you are able: 30 -60 minutes a day ,4 days a week, or 150 minutes a week.  The longer the better.  Combine stretch, strength, and aerobic activities.  If you were told in the past that you have high risk for cardiovascular diseases, you may seek evaluation by your heart doctor prior to initiating moderate to intense exercise programs. ? ? ? ? ?MONITORING & EVALUATION ?Dietary intake, weekly physical activity, and blood sugars in 3 month. ? ?Next  Steps  ?Patient is to work on meal planning. ? ?

## 2021-08-11 NOTE — Progress Notes (Signed)
? ?                                                    Endocrinology Follow Up Note  ?     08/11/2021, 10:20 AM ? ? ?Subjective:  ? ? Patient ID: Diamond Robbins, female    DOB: [birth year].  ?Diamond Robbins is being seen in follow up after being seen in consultation for management of currently uncontrolled symptomatic diabetes requested by  Renee Rival, FNP. ? ? ?Past Medical History:  ?Diagnosis Date  ? Cancer ([place]) 08/01/2020  ? multiple myeloma  ? DKA (diabetic ketoacidosis) ([place]) 02/23/2020  ? Hypertension   ? Type 1 diabetes mellitus ([place]) 02/23/2020  ? ? ?Past Surgical History:  ?Procedure Laterality Date  ? BONE MARROW TRANSPLANT    ? NO PAST SURGERIES    ? ? ?Social History  ? ?Socioeconomic History  ? Marital status: Married  ?  Spouse name: Not on file  ? Number of children: Not on file  ? Years of education: Not on file  ? Highest education level: Not on file  ?Occupational History  ? Occupation: unemployed  ?  Comment: can go back to work Jan 2023  ?Tobacco Use  ? Smoking status: Never  ? Smokeless tobacco: Never  ?Vaping Use  ? Vaping Use: Never used  ?Substance and Sexual Activity  ? Alcohol use: Not Currently  ?  Comment: occ- 0-1 drinks per week; maybe 1 drink every 6 months  ? Drug use: No  ? Sexual activity: Yes  ?  Birth control/protection: Condom  ?Other Topics Concern  ? Not on file  ?Social History Narrative  ? Has 2 step-daughters  ? ?Social Determinants of Health  ? ?Financial Resource Strain: Not on file  ?Food Insecurity: No Food Insecurity  ? Worried About Charity fundraiser in the Last Year: Never true  ? Ran Out of Food in the Last Year: Never true  ?Transportation Needs: No Transportation Needs  ? Lack of Transportation (Medical): No  ? Lack of Transportation (Non-Medical): No  ?Physical Activity: Sufficiently Active  ? Days of Exercise per Week: 3 days  ? Minutes of Exercise per Session: 60 min  ?Stress: Not on file  ?Social Connections: Socially Integrated  ?  Frequency of Communication with Friends and Family: More than three times a week  ? Frequency of Social Gatherings with Friends and Family: More than three times a week  ? Attends Religious Services: More than 4 times per year  ? Active Member of Clubs or Organizations: Yes  ? Attends Archivist Meetings: Never  ? Marital Status: Married  ? ? ?Family History  ?Problem Relation Age of Onset  ? Hypertension Mother   ? Diabetes Mother   ? Hypertension Father   ? Diabetes Maternal Grandmother   ? ? ?Outpatient Encounter Medications as of 08/11/2021  ?Medication Sig  ? ACCU-CHEK GUIDE test strip Use to check blood glucose 4 times daily.  ? Accu-Chek Softclix Lancets lancets Use to check blood glucose 4 times daily.  ? acyclovir (ZOVIRAX) 800 MG tablet Take 800 mg by mouth daily.  ? amLODipine (NORVASC) 10 MG tablet Take 1 tablet by mouth once daily  ? Aspirin 81 MG CAPS Take 81 mg by mouth daily.  ? Blood Glucose Monitoring Suppl (ACCU-CHEK GUIDE) w/Device  KIT   ? calcium carbonate (OS-CAL) 1250 (500 Ca) MG chewable tablet Chew 1 tablet by mouth daily.  ? cetirizine (ZYRTEC) 10 MG tablet Take 1 tablet (10 mg total) by mouth daily.  ? cholecalciferol (VITAMIN D3) 25 MCG (1000 UNIT) tablet Take 1,000 Units by mouth daily.  ? Continuous Blood Gluc Sensor (DEXCOM G6 SENSOR) MISC Change sensor every 10 days as directed  ? Continuous Blood Gluc Transmit (DEXCOM G6 TRANSMITTER) MISC Change transmitter every 90 days as directed.  ? fluticasone (FLONASE) 50 MCG/ACT nasal spray Place 2 sprays into both nostrils daily.  ? folic acid (FOLVITE) 1 MG tablet Take 1 tablet (1 mg total) by mouth daily.  ? insulin aspart (NOVOLOG FLEXPEN) 100 UNIT/ML FlexPen Inject 5-8 Units into the skin 3 (three) times daily with meals.  ? lenalidomide (REVLIMID) 10 MG capsule Take 1 capsule daily for 21 days, then take 7 days off  ? metoprolol tartrate (LOPRESSOR) 25 MG tablet Take 1 tablet (25 mg total) by mouth daily.  ? Multiple Vitamin  (MULTIVITAMIN) tablet Take 1 tablet by mouth daily.  ? rosuvastatin (CRESTOR) 10 MG tablet Take 1 tablet (10 mg total) by mouth daily.  ? UNABLE TO FIND Blood pressure cuff DX I10  ? [DISCONTINUED] insulin glargine (LANTUS) 100 UNIT/ML Solostar Pen Inject 30 Units into the skin daily.  ? insulin glargine (LANTUS) 100 UNIT/ML Solostar Pen Inject 20 Units into the skin daily.  ? ?No facility-administered encounter medications on file as of 08/11/2021.  ? ? ?ALLERGIES: ?No Known Allergies ? ?VACCINATION STATUS: ?Immunization History  ?Administered Date(s) Administered  ? DTaP / HiB / IPV 03/24/2021  ? DTaP / IPV 07/16/2021  ? Hepatitis B, adult 03/24/2021, 07/16/2021  ? HiB (PRP-T) 07/16/2021  ? Influenza,inj,Quad PF,6+ Mos 03/16/2021  ? Pneumococcal Conjugate-13 03/24/2021, 07/16/2021  ? ? ?Diabetes ?She presents for her follow-up diabetic visit. She has type 1 diabetes mellitus. Onset time: Diagnosed at approx age of 8. Her disease course has been worsening. There are no hypoglycemic associated symptoms. Associated symptoms include blurred vision, fatigue, polydipsia, polyuria and weight loss. There are no hypoglycemic complications. Symptoms are stable. There are no diabetic complications. Risk factors for coronary artery disease include diabetes mellitus, dyslipidemia, hypertension and sedentary lifestyle. Current diabetic treatment includes insulin injections. She is compliant with treatment most of the time. Her weight is decreasing steadily. She is following a generally unhealthy diet. When asked about meal planning, she reported none. She has not had a previous visit with a dietitian. She participates in exercise intermittently. Her home blood glucose trend is increasing steadily. Her breakfast blood glucose range is generally >200 mg/dl. Her overall blood glucose range is >200 mg/dl. (She presents today with her meter, no logs, showing above target glycemic profile overall.  She was not due for another A1c  today.  Analysis of her meter shows 7-day average of 281, 14-day average of 276, 30-day average of 279, and 90-day average of 279.  She denies any hypoglycemia and continues to work on diet.) An ACE inhibitor/angiotensin II receptor blocker is not being taken. She does not see a podiatrist.Eye exam is current.  ?Hypertension ?This is a chronic problem. The current episode started more than 1 year ago. The problem has been resolved since onset. The problem is controlled. Associated symptoms include blurred vision. There are no associated agents to hypertension. Risk factors for coronary artery disease include diabetes mellitus, dyslipidemia, family history and sedentary lifestyle. Past treatments include beta blockers and calcium channel  blockers. The current treatment provides moderate improvement. Compliance problems include exercise and diet.   ?Hyperlipidemia ?This is a chronic problem. The current episode started more than 1 year ago. The problem is controlled. Recent lipid tests were reviewed and are normal. Exacerbating diseases include diabetes and obesity. Factors aggravating her hyperlipidemia include beta blockers and fatty foods. Current antihyperlipidemic treatment includes statins. The current treatment provides moderate improvement of lipids. Compliance problems include adherence to diet and adherence to exercise.  Risk factors for coronary artery disease include diabetes mellitus, dyslipidemia, family history, female sex, hypertension and a sedentary lifestyle.  ? ? ?Review of systems ? ?Constitutional: + Minimally fluctuating body weight,  current Body mass index is 29.07 kg/m?. , + fatigue, no subjective hyperthermia, no subjective hypothermia ?Eyes: no blurry vision, no xerophthalmia ?ENT: no sore throat, no nodules palpated in throat, no dysphagia/odynophagia, no hoarseness ?Cardiovascular: no chest pain, no shortness of breath, no palpitations, no leg swelling ?Respiratory: no cough, no shortness  of breath ?Gastrointestinal: no nausea/vomiting/diarrhea ?Genitourinary: +polyuria ?Musculoskeletal: no muscle/joint aches ?Skin: no rashes, no hyperemia ?Neurological: no tremors, no numbness, no tingling,

## 2021-08-12 ENCOUNTER — Telehealth: Payer: Self-pay

## 2021-08-12 LAB — KAPPA/LAMBDA LIGHT CHAINS
Kappa free light chain: 19.4 mg/L (ref 3.3–19.4)
Kappa, lambda light chain ratio: 1.47 (ref 0.26–1.65)
Lambda free light chains: 13.2 mg/L (ref 5.7–26.3)

## 2021-08-12 NOTE — Telephone Encounter (Signed)
Started prior authorization for the following: ? ?Dexcom G6 Transmitter ?Key # [id number] ? ?Dexcom G6 Sensors ?Key # [id number] ?

## 2021-08-12 NOTE — Telephone Encounter (Signed)
I have received a fax that the [place] have been approved from 07/29/2021 through 02/08/2022. ?ID# [id number] ?

## 2021-08-13 LAB — PROTEIN ELECTROPHORESIS, SERUM
A/G Ratio: 1.1 (ref 0.7–1.7)
Albumin ELP: 4 g/dL (ref 2.9–4.4)
Alpha-1-Globulin: 0.2 g/dL (ref 0.0–0.4)
Alpha-2-Globulin: 1 g/dL (ref 0.4–1.0)
Beta Globulin: 1.3 g/dL (ref 0.7–1.3)
Gamma Globulin: 1 g/dL (ref 0.4–1.8)
Globulin, Total: 3.5 g/dL (ref 2.2–3.9)
Total Protein ELP: 7.5 g/dL (ref 6.0–8.5)

## 2021-08-14 DIAGNOSIS — Z419 Encounter for procedure for purposes other than remedying health state, unspecified: Secondary | ICD-10-CM | POA: Diagnosis not present

## 2021-08-16 ENCOUNTER — Other Ambulatory Visit (HOSPITAL_COMMUNITY): Payer: Medicaid Other

## 2021-08-16 ENCOUNTER — Inpatient Hospital Stay (HOSPITAL_COMMUNITY): Payer: Medicaid Other | Admitting: Hematology

## 2021-08-16 ENCOUNTER — Telehealth: Payer: Self-pay

## 2021-08-16 ENCOUNTER — Other Ambulatory Visit: Payer: Self-pay | Admitting: Nurse Practitioner

## 2021-08-16 ENCOUNTER — Inpatient Hospital Stay (HOSPITAL_COMMUNITY): Payer: Medicaid Other

## 2021-08-16 NOTE — Telephone Encounter (Signed)
Needs refill called into [facility] - Folic Acid and Lodipine ?

## 2021-08-17 ENCOUNTER — Other Ambulatory Visit: Payer: Self-pay | Admitting: Pharmacist

## 2021-08-17 DIAGNOSIS — E1010 Type 1 diabetes mellitus with ketoacidosis without coma: Secondary | ICD-10-CM

## 2021-08-17 DIAGNOSIS — I10 Essential (primary) hypertension: Secondary | ICD-10-CM

## 2021-08-17 LAB — IMMUNOFIXATION ELECTROPHORESIS
IgA: 142 mg/dL (ref 87–352)
IgG (Immunoglobin G), Serum: 1130 mg/dL (ref 586–1602)
IgM (Immunoglobulin M), Srm: 27 mg/dL (ref 26–217)
Total Protein ELP: 7.7 g/dL (ref 6.0–8.5)

## 2021-08-17 MED ORDER — BLOOD PRESSURE MONITOR KIT: PACK | 0 refills | Status: AC

## 2021-08-17 NOTE — Patient Instructions (Signed)
It was great to speak with you today! ? ?Visit Information ? ?Diamond Robbins was given information about Medicaid Managed Care team care coordination services as a part of their [facility] Medicaid benefit. Diamond Robbins verbally consented to engagement with the [facility] Managed Care team.  ? ?If you are experiencing a medical emergency, please call 911 or report to your local emergency department or urgent care.  ? ?If you have a non-emergency medical problem during routine business hours, please contact your provider's office and ask to speak with a nurse.  ? ?For questions related to your [facility] health plan, please call: [phone] or go here:[email or web address] ? ?If you would like to schedule transportation through your [facility] plan, please call the following number at least 2 days in advance of your appointment: [phone]. ? You can also use the MTM portal or MTM mobile app to manage your rides. For the portal, please go to [email or web address]. ? ? ?Diamond Robbins, PharmD, BCACP ?[name] ?[phone] ? ?

## 2021-08-17 NOTE — Patient Outreach (Signed)
? ?   ? ?Chief Complaint  ?Patient presents with  ? High Risk Managed Medicaid  ? ? ?Diamond Robbins is a 31 y.o. year old female who was referred for medication management by their primary care provider, [facility], Dewaine Conger, FNP. They presented for a telephone visit in the context of the COVID-19 pandemic. ?  ?They were referred to the pharmacist by their High Risk Managed Medicaid Care Team  for assistance in managing diabetes and hypertension.  ? ?Subjective: ?Diabetes, presumed T1: ? ?Current medications: completing supply of Levemir 20 units QPM before she will transition to Lantus 20 units QPM; Novolog 5-8 units with meals per sliding scale; managed by [name], NP  ? ?Current glucose readings: reports she started wearing DexCom G6 about 2 days ago. Denies any episodes of hypoglycemia.  ? ?Met with dietician/CDE last week for dietary education. ? ?Hypertension: ? ?Current medications: amlodipine 10 mg daily, metoprolol 25 mg once daily ? ?Current blood pressure readings readings: not checking at home, has not picked up script for BP machine from the office yet ? ?Of note, no hx RAAS agent. UACR: 30-300 (06/24/21) ? ?Hyperlipidemia/ASCVD Risk Reduction ? ?Current lipid lowering medications: rosuvastatin 10 mg daily ? ?Antiplatelet regimen: aspirin 81 mg daily ?  ? ?Objective: ?Lab Results  ?Component Value Date  ? HGBA1C 10.0 (H) 06/21/2021  ? ? ?Lab Results  ?Component Value Date  ? CREATININE 0.63 08/11/2021  ? BUN 12 08/11/2021  ? NA 136 08/11/2021  ? K 3.8 08/11/2021  ? CL 104 08/11/2021  ? CO2 23 08/11/2021  ? ? ?Lab Results  ?Component Value Date  ? CHOL 158 06/21/2021  ? HDL 62 06/21/2021  ? [place] 74 06/21/2021  ? TRIG 125 06/21/2021  ? CHOLHDL 2.5 06/21/2021  ? ? ?Medications Reviewed Today   ? ? Reviewed by Arlana Lindau, RD (Registered Dietitian) on 08/11/21 at 62  Med List Status: <None>  ? ?Medication Order Taking? Sig Documenting Provider Last Dose Status Informant  ?ACCU-CHEK GUIDE  test strip [id number] No Use to check blood glucose 4 times daily. Brita Romp, NP Taking Active   ?Accu-Chek Softclix Lancets lancets [id number] No Use to check blood glucose 4 times daily. Brita Romp, NP Taking Active   ?acyclovir (ZOVIRAX) 800 MG tablet [id number] No Take 800 mg by mouth daily. [provider] Taking Active   ?amLODipine (NORVASC) 10 MG tablet [id number] No Take 1 tablet by mouth once daily [facility], Dewaine Conger, FNP Taking Active   ?Aspirin 81 MG CAPS [id number] No Take 81 mg by mouth daily. [provider] Taking Active   ?Blood Glucose Monitoring Suppl (ACCU-CHEK GUIDE) w/Device KIT [id number] No  [provider] Taking Active Self  ?calcium carbonate (OS-CAL) 1250 (500 Ca) MG chewable tablet [id number] No Chew 1 tablet by mouth daily. [provider] Taking Active   ?cetirizine (ZYRTEC) 10 MG tablet [id number] No Take 1 tablet (10 mg total) by mouth daily. Renee Rival, FNP Taking Active   ?cholecalciferol (VITAMIN D3) 25 MCG (1000 UNIT) tablet [id number] No Take 1,000 Units by mouth daily. [provider] Taking Active   ?Continuous Blood Gluc Sensor (DEXCOM G6 SENSOR) MISC [id number]  Change sensor every 10 days as directed Brita Romp, NP  Active   ?Continuous Blood Gluc Transmit (DEXCOM G6 TRANSMITTER) MISC [id number]  Change transmitter every 90 days as directed. Brita Romp, NP  Active   ?fluticasone [facility]) 50 MCG/ACT nasal spray [id number] No Place 2 sprays  into both nostrils daily. Renee Rival, FNP Taking Active   ?folic acid (FOLVITE) 1 MG tablet [id number] No Take 1 tablet (1 mg total) by mouth daily. Renee Rival, FNP Taking Active   ?insulin aspart (NOVOLOG FLEXPEN) 100 UNIT/ML FlexPen [id number]  Inject 5-8 Units into the skin 3 (three) times daily with meals. Brita Romp, NP  Active   ?insulin glargine (LANTUS) 100 UNIT/ML Solostar Pen [id number]  Inject 20 Units into the skin daily.  Brita Romp, NP  Active   ?lenalidomide (REVLIMID) 10 MG capsule [id number] No Take 1 capsule daily for 21 days, then take 7 days off [name], MD Taking Active   ?metoprolol tartrate (LOPRESSOR) 25 MG tablet [id number] No Take 1 tablet (25 mg total) by mouth daily. Renee Rival, FNP Taking Active   ?Multiple Vitamin (MULTIVITAMIN) tablet [id number] No Take 1 tablet by mouth daily. [provider] Taking Active   ?rosuvastatin (CRESTOR) 10 MG tablet [id number] No Take 1 tablet (10 mg total) by mouth daily. Renee Rival, FNP Taking Active   ?UNABLE TO FIND [id number] No Blood pressure cuff DX I10 [facility], Dewaine Conger, FNP Taking Active   ?Med List Note [name], CPhT 03/07/13 1442): No preferred pharmacy.   ? ?  ?  ? ?  ? ? ?Assessment/Plan:  ?Care Plan : Medication Management  ?Updates made by Osker Mason, RPH-CPP since 08/17/2021 12:00 AM  ?  ? ?Problem: Diabetes   ?  ? ?Long-Range Goal: A1c <7%   ?Note:   ?Current Barriers:  ?Unable to independently monitor therapeutic efficacy ?Unable to achieve control of diabetes  ? ?Patient Needs: ?Consideration for CGM ?Collaboration with team to optimize medication regimen ? ?Patient Activities: ?Patient will:  ?- take medications as prescribed as evidenced by patient report and record review ?check glucose continuously using CGM, document, and provide at future appointments ? ?  ? ?Problem: Cardiovascular Risk Reduction   ?  ? ?Long-Range Goal: BP <130/80; LDL <70   ?Note:   ?Current Barriers:  ?Unable to achieve control of lipids  ?Suboptimal therapeutic regimen for hypertension ? ?Patient Needs: ?Collaboration with care providers ?Working blood pressure meter ? ?Patient Activities: ?Patient will:  ?- check blood pressure periodically, document, and provide at future appointments ?target a minimum of 150 minutes of moderate intensity exercise weekly ? ?  ? ? ?Diabetes: ?- Currently uncontrolled ?- Reviewed to change  back to Lantus after completion of Levemir supply.  ?- Reinforced principals of mealtime insulin ?- Reviewed goal TIR of 70% when using CGM. Will connect with DexCom Clarity Portal moving forward.  ?- Recommend to continue current regimen along with collaboration with endocrinology.  ?- Consider GADA + C-peptide moving forward as work up for T1DM (patient never received full workup) ? ?Hypertension: ?- Currently controlled but suboptimally managed.  ?- Collaborated with PCP to send order for BP machine to [facility]. Patient was instructed to call later today or tomorrow to ensure order was received and to pick up.  ?- Reviewed appropriate blood pressure monitoring technique and reviewed goal blood pressure. Recommended to check home blood pressure and heart rate daily ?- No compelling indication for beta blocker. Given elevated UACR, consider d/c metoprolol and add low dose valsartan.  ? ?Hyperlipidemia/ASCVD Risk Reduction: ?- Currently uncontrolled.  ?- Consider statin dose increase moving forward if next LDL is not at goal <70 ? ? ?Follow Up Plan: phone visit in 6 weeks ? ?[name], PharmD, BCACP ?  [place] ?[phone] ? ? ?

## 2021-08-18 ENCOUNTER — Other Ambulatory Visit: Payer: Self-pay

## 2021-08-18 ENCOUNTER — Inpatient Hospital Stay (HOSPITAL_COMMUNITY): Payer: Medicaid Other

## 2021-08-18 ENCOUNTER — Telehealth: Payer: Self-pay

## 2021-08-18 ENCOUNTER — Inpatient Hospital Stay (HOSPITAL_COMMUNITY): Payer: Medicaid Other | Admitting: Hematology

## 2021-08-18 MED ORDER — ACCU-CHEK GUIDE VI STRP: ORAL_STRIP | 1 refills | Status: DC

## 2021-08-18 NOTE — Telephone Encounter (Signed)
Received 2 letters with the approval of the Dexcom G6 Transmitter and the sensors from 08/11/2021 through 02/12/2022 ?[employer] ?

## 2021-08-18 NOTE — Telephone Encounter (Signed)
Patient called with concerns about her blood sugar readings. I helped her download her Dexcom Clarity App share information with [place] and printed her recent readings to show Whitney. Whitney advised to increase her Lantus to 30 units QHS.  ? ?I called the patient back to give her this information. Patient verbalized an understanding and will let us know if no changes. ?

## 2021-08-19 ENCOUNTER — Ambulatory Visit (HOSPITAL_COMMUNITY): Payer: Medicaid Other

## 2021-08-19 ENCOUNTER — Ambulatory Visit (HOSPITAL_COMMUNITY): Payer: Medicaid Other | Admitting: Hematology

## 2021-08-23 ENCOUNTER — Encounter (HOSPITAL_COMMUNITY): Payer: Self-pay

## 2021-08-23 NOTE — Progress Notes (Signed)
Called patient because it is time for Revlimid refill, which requires pregnancy test prior to refill. Patient requests to delay to Monday, April 17th as scheduled for labs. Dr. Delton Coombes made aware. ?

## 2021-08-30 ENCOUNTER — Inpatient Hospital Stay (HOSPITAL_COMMUNITY): Payer: Medicaid Other | Attending: Hematology

## 2021-08-30 DIAGNOSIS — D472 Monoclonal gammopathy: Secondary | ICD-10-CM | POA: Diagnosis not present

## 2021-08-30 DIAGNOSIS — C9000 Multiple myeloma not having achieved remission: Secondary | ICD-10-CM | POA: Diagnosis not present

## 2021-08-30 LAB — COMPREHENSIVE METABOLIC PANEL
ALT: 37 U/L (ref 0–44)
AST: 26 U/L (ref 15–41)
Albumin: 4.5 g/dL (ref 3.5–5.0)
Alkaline Phosphatase: 83 U/L (ref 38–126)
Anion gap: 9 (ref 5–15)
BUN: 13 mg/dL (ref 6–20)
CO2: 25 mmol/L (ref 22–32)
Calcium: 9.6 mg/dL (ref 8.9–10.3)
Chloride: 105 mmol/L (ref 98–111)
Creatinine, Ser: 0.68 mg/dL (ref 0.44–1.00)
GFR, Estimated: >60 mL/min (ref >60)
Glucose, Bld: 192 mg/dL — ABNORMAL HIGH (ref 70–99)
Potassium: 3.7 mmol/L (ref 3.5–5.1)
Sodium: 139 mmol/L (ref 135–145)
Total Bilirubin: 0.3 mg/dL (ref 0.3–1.2)
Total Protein: 8.6 g/dL — ABNORMAL HIGH (ref 6.5–8.1)

## 2021-08-30 LAB — CBC WITH DIFFERENTIAL/PLATELET
Abs Immature Granulocytes: 0.01 10*3/uL (ref 0.00–0.07)
Basophils Absolute: 0.1 10*3/uL (ref 0.0–0.1)
Basophils Relative: 1 %
Eosinophils Absolute: 0.2 10*3/uL (ref 0.0–0.5)
Eosinophils Relative: 5 %
HCT: 43.3 % (ref 36.0–46.0)
Hemoglobin: 13.5 g/dL (ref 12.0–15.0)
Immature Granulocytes: 0 %
Lymphocytes Relative: 40 %
Lymphs Abs: 1.7 10*3/uL (ref 0.7–4.0)
MCH: 26.6 pg (ref 26.0–34.0)
MCHC: 31.2 g/dL (ref 30.0–36.0)
MCV: 85.2 fL (ref 80.0–100.0)
Monocytes Absolute: 0.3 10*3/uL (ref 0.1–1.0)
Monocytes Relative: 8 %
Neutro Abs: 2 10*3/uL (ref 1.7–7.7)
Neutrophils Relative %: 46 %
Platelets: 259 10*3/uL (ref 150–400)
RBC: 5.08 MIL/uL (ref 3.87–5.11)
RDW: 14.3 % (ref 11.5–15.5)
WBC: 4.3 10*3/uL (ref 4.0–10.5)
nRBC: 0 % (ref 0.0–0.2)

## 2021-08-30 LAB — PREGNANCY, URINE: Preg Test, Ur: NEGATIVE

## 2021-08-30 LAB — LACTATE DEHYDROGENASE: LDH: 160 U/L (ref 98–192)

## 2021-08-30 LAB — MAGNESIUM: Magnesium: 1.6 mg/dL — ABNORMAL LOW (ref 1.7–2.4)

## 2021-08-31 ENCOUNTER — Other Ambulatory Visit (HOSPITAL_COMMUNITY): Payer: Self-pay

## 2021-08-31 LAB — PROTEIN ELECTROPHORESIS, SERUM
A/G Ratio: 1.1 (ref 0.7–1.7)
Albumin ELP: 3.9 g/dL (ref 2.9–4.4)
Alpha-1-Globulin: 0.2 g/dL (ref 0.0–0.4)
Alpha-2-Globulin: 0.9 g/dL (ref 0.4–1.0)
Beta Globulin: 1.3 g/dL (ref 0.7–1.3)
Gamma Globulin: 1.2 g/dL (ref 0.4–1.8)
Globulin, Total: 3.6 g/dL (ref 2.2–3.9)
Total Protein ELP: 7.5 g/dL (ref 6.0–8.5)

## 2021-08-31 LAB — KAPPA/LAMBDA LIGHT CHAINS
Kappa free light chain: 20.5 mg/L — ABNORMAL HIGH (ref 3.3–19.4)
Kappa, lambda light chain ratio: 1.49 (ref 0.26–1.65)
Lambda free light chains: 13.8 mg/L (ref 5.7–26.3)

## 2021-08-31 LAB — IMMUNOFIXATION ELECTROPHORESIS
IgA: 121 mg/dL (ref 87–352)
IgG (Immunoglobin G), Serum: 1240 mg/dL (ref 586–1602)
IgM (Immunoglobulin M), Srm: 33 mg/dL (ref 26–217)
Total Protein ELP: 8.1 g/dL (ref 6.0–8.5)

## 2021-08-31 MED ORDER — LENALIDOMIDE 10 MG PO CAPS: ORAL_CAPSULE | ORAL | 0 refills | Status: DC

## 2021-08-31 NOTE — Progress Notes (Signed)
Chart reviewed. Revlimid refilled per last office note with Dr. Katragadda.  

## 2021-09-02 ENCOUNTER — Ambulatory Visit (HOSPITAL_COMMUNITY): Payer: Medicaid Other | Admitting: Hematology

## 2021-09-02 ENCOUNTER — Ambulatory Visit (HOSPITAL_COMMUNITY): Payer: Medicaid Other

## 2021-09-02 DIAGNOSIS — Z23 Encounter for immunization: Secondary | ICD-10-CM

## 2021-09-02 DIAGNOSIS — C9000 Multiple myeloma not having achieved remission: Secondary | ICD-10-CM

## 2021-09-07 ENCOUNTER — Other Ambulatory Visit (HOSPITAL_COMMUNITY): Payer: Self-pay | Admitting: *Deleted

## 2021-09-07 ENCOUNTER — Ambulatory Visit: Payer: Medicaid Other

## 2021-09-07 DIAGNOSIS — C9000 Multiple myeloma not having achieved remission: Secondary | ICD-10-CM

## 2021-09-08 ENCOUNTER — Inpatient Hospital Stay (HOSPITAL_COMMUNITY): Payer: Medicaid Other

## 2021-09-08 ENCOUNTER — Inpatient Hospital Stay (HOSPITAL_COMMUNITY): Payer: Medicaid Other | Attending: Hematology | Admitting: Hematology

## 2021-09-08 ENCOUNTER — Other Ambulatory Visit (HOSPITAL_COMMUNITY): Payer: Self-pay | Admitting: *Deleted

## 2021-09-08 VITALS — BP 130/89 | HR 77 | Temp 97.5°F | Resp 17 | Ht 63.0 in | Wt 179.7 lb

## 2021-09-08 DIAGNOSIS — E785 Hyperlipidemia, unspecified: Secondary | ICD-10-CM | POA: Insufficient documentation

## 2021-09-08 DIAGNOSIS — Z833 Family history of diabetes mellitus: Secondary | ICD-10-CM | POA: Insufficient documentation

## 2021-09-08 DIAGNOSIS — C9000 Multiple myeloma not having achieved remission: Secondary | ICD-10-CM | POA: Diagnosis not present

## 2021-09-08 DIAGNOSIS — Z8249 Family history of ischemic heart disease and other diseases of the circulatory system: Secondary | ICD-10-CM | POA: Insufficient documentation

## 2021-09-08 DIAGNOSIS — I10 Essential (primary) hypertension: Secondary | ICD-10-CM | POA: Insufficient documentation

## 2021-09-08 DIAGNOSIS — Z7689 Persons encountering health services in other specified circumstances: Secondary | ICD-10-CM | POA: Diagnosis not present

## 2021-09-08 DIAGNOSIS — Z79899 Other long term (current) drug therapy: Secondary | ICD-10-CM | POA: Insufficient documentation

## 2021-09-08 DIAGNOSIS — Z7961 Long term (current) use of immunomodulator: Secondary | ICD-10-CM | POA: Insufficient documentation

## 2021-09-08 DIAGNOSIS — E1069 Type 1 diabetes mellitus with other specified complication: Secondary | ICD-10-CM | POA: Diagnosis not present

## 2021-09-08 DIAGNOSIS — Z9481 Bone marrow transplant status: Secondary | ICD-10-CM | POA: Insufficient documentation

## 2021-09-08 LAB — CBC WITH DIFFERENTIAL/PLATELET
Abs Immature Granulocytes: 0.02 10*3/uL (ref 0.00–0.07)
Basophils Absolute: 0 10*3/uL (ref 0.0–0.1)
Basophils Relative: 1 %
Eosinophils Absolute: 0.2 10*3/uL (ref 0.0–0.5)
Eosinophils Relative: 5 %
HCT: 45.5 % (ref 36.0–46.0)
Hemoglobin: 14.4 g/dL (ref 12.0–15.0)
Immature Granulocytes: 0 %
Lymphocytes Relative: 24 %
Lymphs Abs: 1.1 10*3/uL (ref 0.7–4.0)
MCH: 26.3 pg (ref 26.0–34.0)
MCHC: 31.6 g/dL (ref 30.0–36.0)
MCV: 83.2 fL (ref 80.0–100.0)
Monocytes Absolute: 0.2 10*3/uL (ref 0.1–1.0)
Monocytes Relative: 5 %
Neutro Abs: 3.1 10*3/uL (ref 1.7–7.7)
Neutrophils Relative %: 65 %
Platelets: 297 10*3/uL (ref 150–400)
RBC: 5.47 MIL/uL — ABNORMAL HIGH (ref 3.87–5.11)
RDW: 14.4 % (ref 11.5–15.5)
WBC: 4.7 10*3/uL (ref 4.0–10.5)
nRBC: 0 % (ref 0.0–0.2)

## 2021-09-08 LAB — COMPREHENSIVE METABOLIC PANEL
ALT: 42 U/L (ref 0–44)
AST: 28 U/L (ref 15–41)
Albumin: 4.7 g/dL (ref 3.5–5.0)
Alkaline Phosphatase: 86 U/L (ref 38–126)
Anion gap: 10 (ref 5–15)
BUN: 11 mg/dL (ref 6–20)
CO2: 25 mmol/L (ref 22–32)
Calcium: 9.7 mg/dL (ref 8.9–10.3)
Chloride: 101 mmol/L (ref 98–111)
Creatinine, Ser: 0.61 mg/dL (ref 0.44–1.00)
GFR, Estimated: >60 mL/min (ref >60)
Glucose, Bld: 191 mg/dL — ABNORMAL HIGH (ref 70–99)
Potassium: 3.8 mmol/L (ref 3.5–5.1)
Sodium: 136 mmol/L (ref 135–145)
Total Bilirubin: 0.5 mg/dL (ref 0.3–1.2)
Total Protein: 8.8 g/dL — ABNORMAL HIGH (ref 6.5–8.1)

## 2021-09-08 LAB — PREGNANCY, URINE: Preg Test, Ur: NEGATIVE

## 2021-09-08 LAB — MAGNESIUM: Magnesium: 1.8 mg/dL (ref 1.7–2.4)

## 2021-09-08 MED ORDER — SODIUM CHLORIDE 0.9 % IV SOLN: INTRAVENOUS | Status: DC

## 2021-09-08 MED ORDER — ZOLEDRONIC ACID 4 MG/100ML IV SOLN
4.0000 mg | Freq: Once | INTRAVENOUS | Status: AC
  Administered 2021-09-08: 4 mg via INTRAVENOUS
  Filled 2021-09-08: qty 100

## 2021-09-08 NOTE — Patient Instructions (Signed)
[place] at [facility] ?Discharge Instructions ? ? ?You were seen and examined today by Dr. Delton Coombes. ? ?He reviewed the results of your lab work which is normal/stable. ? ?Continue Revlimid as prescribed. ? ?We will give your Zometa infusion today. ? ?Return as scheduled in 12 weeks.  ? ? ?Thank you for choosing [place] at [facility] to provide your oncology and hematology care.  To afford each patient quality time with our provider, please arrive at least 15 minutes before your scheduled appointment time.  ? ?If you have a lab appointment with the [facility] please come in thru the Main Entrance and check in at the main information desk. ? ?You need to re-schedule your appointment should you arrive 10 or more minutes late.  We strive to give you quality time with our providers, and arriving late affects you and other patients whose appointments are after yours.  Also, if you no show three or more times for appointments you may be dismissed from the clinic at the providers discretion.     ?Again, thank you for choosing [facility].  Our hope is that these requests will decrease the amount of time that you wait before being seen by our physicians.       ?_____________________________________________________________ ? ?Should you have questions after your visit to [facility], please contact our office at [phone] and follow the prompts.  Our office hours are 8:00 a.m. and 4:30 p.m. Monday - Friday.  Please note that voicemails left after 4:00 p.m. may not be returned until the following business day.  We are closed weekends and major holidays.  You do have access to a nurse 24-7, just call the main number to the clinic [phone] and do not press any options, hold on the line and a nurse will answer the phone.   ? ?For prescription refill requests, have your pharmacy contact our office and allow 72 hours.   ? ?Due to Covid,  you will need to wear a mask upon entering the hospital. If you do not have a mask, a mask will be given to you at the Main Entrance upon arrival. For doctor visits, patients may have 1 support person age 44 or older with them. For treatment visits, patients can not have anyone with them due to social distancing guidelines and our immunocompromised population.  ? ?   ?

## 2021-09-08 NOTE — Progress Notes (Signed)
Patient is taking Revlimid as prescribed.  She has not missed any doses and reports no side effects at this time.   

## 2021-09-08 NOTE — Patient Instructions (Signed)
[place]  Discharge Instructions: ?Thank you for choosing [place] [facility] to provide your oncology and hematology care.  ?If you have a lab appointment with the [facility], please come in thru the Main Entrance and check in at the main information desk. ? ?Wear comfortable clothing and clothing appropriate for easy access to any Portacath or PICC line.  ? ?We strive to give you quality time with your provider. You may need to reschedule your appointment if you arrive late (15 or more minutes).  Arriving late affects you and other patients whose appointments are after yours.  Also, if you miss three or more appointments without notifying the office, you may be dismissed from the clinic at the provider?s discretion.    ?  ?For prescription refill requests, have your pharmacy contact our office and allow 72 hours for refills to be completed.   ? ?Today you received the following chemotherapy and/or immunotherapy agents zometa.  ?  ?To help prevent nausea and vomiting after your treatment, we encourage you to take your nausea medication as directed. ? ?BELOW ARE SYMPTOMS THAT SHOULD BE REPORTED IMMEDIATELY: ?*FEVER GREATER THAN 100.4 F (38 ?C) OR HIGHER ?*CHILLS OR SWEATING ?*NAUSEA AND VOMITING THAT IS NOT CONTROLLED WITH YOUR NAUSEA MEDICATION ?*UNUSUAL SHORTNESS OF BREATH ?*UNUSUAL BRUISING OR BLEEDING ?*URINARY PROBLEMS (pain or burning when urinating, or frequent urination) ?*BOWEL PROBLEMS (unusual diarrhea, constipation, pain near the anus) ?TENDERNESS IN MOUTH AND THROAT WITH OR WITHOUT PRESENCE OF ULCERS (sore throat, sores in mouth, or a toothache) ?UNUSUAL RASH, SWELLING OR PAIN  ?UNUSUAL VAGINAL DISCHARGE OR ITCHING  ? ?Items with * indicate a potential emergency and should be followed up as soon as possible or go to the Emergency Department if any problems should occur. ? ?Please show the CHEMOTHERAPY ALERT CARD or IMMUNOTHERAPY ALERT CARD at check-in to the Emergency Department  and triage nurse. ? ?Should you have questions after your visit or need to cancel or reschedule your appointment, please contact [facility] [phone]  and follow the prompts.  Office hours are 8:00 a.m. to 4:30 p.m. Monday - Friday. Please note that voicemails left after 4:00 p.m. may not be returned until the following business day.  We are closed weekends and major holidays. You have access to a nurse at all times for urgent questions. Please call the main number to the clinic [phone] and follow the prompts. ? ?For any non-urgent questions, you may also contact your provider using MyChart. We now offer e-Visits for anyone 61 and older to request care online for non-urgent symptoms. For details visit [email or web address]. ?  ?Also download the MyChart app! Go to the app store, search "MyChart", open the app, select [facility], and log in with your MyChart username and password. ? ?Due to Covid, a mask is required upon entering the hospital/clinic. If you do not have a mask, one will be given to you upon arrival. For doctor visits, patients may have 1 support person aged 21 or older with them. For treatment visits, patients cannot have anyone with them due to current Covid guidelines and our immunocompromised population.  ?

## 2021-09-08 NOTE — Progress Notes (Signed)
? ?[facility] ?[address] ?[place], [place] [address] ? ? ?CLINIC:  ?Medical Oncology/Hematology ? ?PCP:  ?Renee Rival, FNP ?[address] / [place] [address]  ?[phone] ? ?REASON FOR VISIT:  ?Follow-up for IgA kappa multiple myeloma ? ?PRIOR THERAPY: none ? ?CURRENT THERAPY: RVD and Darzalex Faspro on D1, 4, 8, 11, and 15; Revlimid 2 weeks on, 1 week off; Zometa every 4 weeks ? ?INTERVAL HISTORY:  ?Ms. [name], a 31 y.o. female, returns for routine follow-up for her IgA kappa multiple myeloma. Micah was last seen on 05/25/2021. ? ?Today she reports feeling good. Her weight is stable. She is continuing Revlimid 3 weeks on and 1 week off. She denies new pains and numbness/tingling. She denies recent infections. Her appetite is good, and she denies changes in BM.  ? ?REVIEW OF SYSTEMS:  ?Review of Systems  ?Constitutional:  Negative for appetite change, fatigue and unexpected weight change.  ?Gastrointestinal:  Negative for constipation and diarrhea.  ?All other systems reviewed and are negative. ? ?PAST MEDICAL/SURGICAL HISTORY:  ?Past Medical History:  ?Diagnosis Date  ? Cancer ([place]) 08/01/2020  ? multiple myeloma  ? DKA (diabetic ketoacidosis) ([place]) 02/23/2020  ? Hypertension   ? Type 1 diabetes mellitus ([place]) 02/23/2020  ? ?Past Surgical History:  ?Procedure Laterality Date  ? BONE MARROW TRANSPLANT    ? NO PAST SURGERIES    ? ? ?SOCIAL HISTORY:  ?Social History  ? ?Socioeconomic History  ? Marital status: Married  ?  Spouse name: Not on file  ? Number of children: Not on file  ? Years of education: Not on file  ? Highest education level: Not on file  ?Occupational History  ? Occupation: unemployed  ?  Comment: can go back to work Jan 2023  ?Tobacco Use  ? Smoking status: Never  ? Smokeless tobacco: Never  ?Vaping Use  ? Vaping Use: Never used  ?Substance and Sexual Activity  ? Alcohol use: Not Currently  ?  Comment: occ- 0-1 drinks per week; maybe 1  drink every 6 months  ? Drug use: No  ? Sexual activity: Yes  ?  Birth control/protection: Condom  ?Other Topics Concern  ? Not on file  ?Social History Narrative  ? Has 2 step-daughters  ? ?Social Determinants of Health  ? ?Financial Resource Strain: Not on file  ?Food Insecurity: No Food Insecurity  ? Worried About Charity fundraiser in the Last Year: Never true  ? Ran Out of Food in the Last Year: Never true  ?Transportation Needs: No Transportation Needs  ? Lack of Transportation (Medical): No  ? Lack of Transportation (Non-Medical): No  ?Physical Activity: Sufficiently Active  ? Days of Exercise per Week: 3 days  ? Minutes of Exercise per Session: 60 min  ?Stress: Not on file  ?Social Connections: Socially Integrated  ? Frequency of Communication with Friends and Family: More than three times a week  ? Frequency of Social Gatherings with Friends and Family: More than three times a week  ? Attends Religious Services: More than 4 times per year  ? Active Member of Clubs or Organizations: Yes  ? Attends Archivist Meetings: Never  ? Marital Status: Married  ?Intimate Partner Violence: Not on file  ? ? ?FAMILY HISTORY:  ?Family History  ?Problem Relation Age of Onset  ? Hypertension Mother   ? Diabetes Mother   ? Hypertension Father   ? Diabetes Maternal Grandmother   ? ? ?  CURRENT MEDICATIONS:  ?Current Outpatient Medications  ?Medication Sig Dispense Refill  ? ACCU-CHEK GUIDE test strip Use to check blood glucose 4 times daily. 125 each 1  ? Accu-Chek Softclix Lancets lancets Use to check blood glucose 4 times daily. 200 each 0  ? acyclovir (ZOVIRAX) 800 MG tablet Take 800 mg by mouth daily.    ? amLODipine (NORVASC) 10 MG tablet Take 1 tablet by mouth once daily 30 tablet 0  ? Aspirin 81 MG CAPS Take 81 mg by mouth daily.    ? Blood Glucose Monitoring Suppl (ACCU-CHEK GUIDE) w/Device KIT     ? Blood Pressure Monitor KIT Use to check blood pressure as instructed 1 kit 0  ? calcium carbonate (OS-CAL)  1250 (500 Ca) MG chewable tablet Chew 1 tablet by mouth daily.    ? cetirizine (ZYRTEC) 10 MG tablet Take 1 tablet by mouth once daily 30 tablet 0  ? cholecalciferol (VITAMIN D3) 25 MCG (1000 UNIT) tablet Take 1,000 Units by mouth daily.    ? Continuous Blood Gluc Sensor (DEXCOM G6 SENSOR) MISC Change sensor every 10 days as directed 9 each 3  ? Continuous Blood Gluc Transmit (DEXCOM G6 TRANSMITTER) MISC Change transmitter every 90 days as directed. 1 each 3  ? fluticasone (FLONASE) 50 MCG/ACT nasal spray Place 2 sprays into both nostrils daily. 16 g 6  ? folic acid (FOLVITE) 1 MG tablet Take 1 tablet by mouth once daily 30 tablet 0  ? insulin aspart (NOVOLOG FLEXPEN) 100 UNIT/ML FlexPen Inject 5-8 Units into the skin 3 (three) times daily with meals. 15 mL 3  ? insulin glargine (LANTUS) 100 UNIT/ML Solostar Pen Inject 20 Units into the skin daily. 27 mL 2  ? lenalidomide (REVLIMID) 10 MG capsule Take 1 capsule daily for 21 days, then take 7 days off 21 capsule 0  ? metoprolol tartrate (LOPRESSOR) 25 MG tablet Take 1 tablet (25 mg total) by mouth daily. 30 tablet 1  ? Multiple Vitamin (MULTIVITAMIN) tablet Take 1 tablet by mouth daily.    ? rosuvastatin (CRESTOR) 10 MG tablet Take 1 tablet (10 mg total) by mouth daily. 90 tablet 3  ? UNABLE TO FIND Blood pressure cuff DX I10 1 each 0  ? ?No current facility-administered medications for this visit.  ? ? ?ALLERGIES:  ?No Known Allergies ? ?PHYSICAL EXAM:  ?Performance status (ECOG): 1 - Symptomatic but completely ambulatory ? ?Vitals:  ? 09/08/21 1131  ?BP: 130/89  ?Pulse: 77  ?Resp: 17  ?Temp: (!) 97.5 ?F (36.4 ?C)  ?SpO2: 98%  ? ?Wt Readings from Last 3 Encounters:  ?09/08/21 179 lb 11.2 oz (81.5 kg)  ?08/11/21 177 lb (80.3 kg)  ?08/11/21 177 lb 6.4 oz (80.5 kg)  ? ?Physical Exam ?Vitals reviewed.  ?Constitutional:   ?   Appearance: Normal appearance.  ?Cardiovascular:  ?   Rate and Rhythm: Normal rate and regular rhythm.  ?   Pulses: Normal pulses.  ?   Heart  sounds: Normal heart sounds.  ?Pulmonary:  ?   Effort: Pulmonary effort is normal.  ?   Breath sounds: Normal breath sounds.  ?Neurological:  ?   General: No focal deficit present.  ?   Mental Status: She is alert and oriented to person, place, and time.  ?Psychiatric:     ?   Mood and Affect: Mood normal.     ?   Behavior: Behavior normal.  ? ? ?LABORATORY DATA:  ?I have reviewed the labs as listed.  ? ?  Latest Ref Rng & Units 09/08/2021  ? 10:19 AM 08/30/2021  ?  2:11 PM 08/11/2021  ?  8:54 AM  ?CBC  ?WBC 4.0 - 10.5 K/uL 4.7   4.3   3.6    ?Hemoglobin 12.0 - 15.0 g/dL 14.4   13.5   13.8    ?Hematocrit 36.0 - 46.0 % 45.5   43.3   43.6    ?Platelets 150 - 400 K/uL 297   259   325    ? ? ?  Latest Ref Rng & Units 09/08/2021  ? 10:19 AM 08/30/2021  ?  2:11 PM 08/11/2021  ?  8:54 AM  ?CMP  ?Glucose 70 - 99 mg/dL 191   192   271    ?BUN 6 - 20 mg/dL _0 ?Creatinine 0.44 - 1.00 mg/dL 0.61   0.68   0.63    ?Sodium 135 - 145 mmol/L 136   139   136    ?Potassium 3.5 - 5.1 mmol/L 3.8   3.7   3.8    ?Chloride 98 - 111 mmol/L 101   105   104    ?CO2 22 - 32 mmol/L _1 ?Calcium 8.9 - 10.3 mg/dL 9.7   9.6   9.5    ?Total Protein 6.5 - 8.1 g/dL 8.8   8.6   8.3    ?Total Bilirubin 0.3 - 1.2 mg/dL 0.5   0.3   0.4    ?Alkaline Phos 38 - 126 U/L 86   83   96    ?AST 15 - 41 U/L _2 ?ALT 0 - 44 U/L 42   37   33    ? ?   ?Component Value Date/Time  ? RBC 5.47 (H) 09/08/2021 1019  ? MCV 83.2 09/08/2021 1019  ? MCV 82 02/18/2021 0934  ? MCH 26.3 09/08/2021 1019  ? MCHC 31.6 09/08/2021 1019  ? RDW 14.4 09/08/2021 1019  ? RDW 13.2 02/18/2021 0934  ? LYMPHSABS 1.1 09/08/2021 1019  ? LYMPHSABS 1.1 02/18/2021 0934  ? MONOABS 0.2 09/08/2021 1019  ? EOSABS 0.2 09/08/2021 1019  ? EOSABS 0.5 (H) 02/18/2021 0934  ? BASOSABS 0.0 09/08/2021 1019  ? BASOSABS 0.0 02/18/2021 0934  ? ? ?DIAGNOSTIC IMAGING:  ?I have independently reviewed the scans and discussed with the patient. ?No results found.  ? ?ASSESSMENT:  ?1.   Stage III IgA kappa multiple myeloma: ?-Presentation to [facility] in [place] with confusion and weakness. ?-Found to have diabetic ketoacidosis with newly diagnosed type 1 diabetes. ?-Also

## 2021-09-08 NOTE — Progress Notes (Signed)
Patient tolerated therapy with no complaints voiced. Labs reviewed. Side effects with management reviewed with understanding verbalized. Peripheral IV site clean and dry with no bruising or swelling noted at site. Good blood return noted before and after administration of therapy. Band aid applied. Patient left in satisfactory condition with VSS and no s/s of distress noted.  

## 2021-09-09 ENCOUNTER — Ambulatory Visit (INDEPENDENT_AMBULATORY_CARE_PROVIDER_SITE_OTHER): Payer: Medicaid Other | Admitting: Nurse Practitioner

## 2021-09-09 ENCOUNTER — Encounter: Payer: Self-pay | Admitting: Nurse Practitioner

## 2021-09-09 VITALS — BP 128/89 | HR 91 | Ht 63.0 in | Wt 178.0 lb

## 2021-09-09 DIAGNOSIS — I10 Essential (primary) hypertension: Secondary | ICD-10-CM | POA: Diagnosis not present

## 2021-09-09 DIAGNOSIS — E785 Hyperlipidemia, unspecified: Secondary | ICD-10-CM

## 2021-09-09 DIAGNOSIS — Z7689 Persons encountering health services in other specified circumstances: Secondary | ICD-10-CM | POA: Diagnosis not present

## 2021-09-09 DIAGNOSIS — E1069 Type 1 diabetes mellitus with other specified complication: Secondary | ICD-10-CM | POA: Diagnosis not present

## 2021-09-09 DIAGNOSIS — E1010 Type 1 diabetes mellitus with ketoacidosis without coma: Secondary | ICD-10-CM

## 2021-09-09 MED ORDER — LOSARTAN POTASSIUM 25 MG PO TABS: 25.0000 mg | ORAL_TABLET | Freq: Every day | ORAL | 1 refills | Status: DC

## 2021-09-09 MED ORDER — AMLODIPINE BESYLATE 5 MG PO TABS: 5.0000 mg | ORAL_TABLET | Freq: Every day | ORAL | 1 refills | Status: DC

## 2021-09-09 MED ORDER — METOPROLOL TARTRATE 25 MG PO TABS: 12.5000 mg | ORAL_TABLET | Freq: Two times a day (BID) | ORAL | 1 refills | Status: DC

## 2021-09-09 NOTE — Assessment & Plan Note (Signed)
Lab Results  ?Component Value Date  ? CHOL 158 06/21/2021  ? HDL 62 06/21/2021  ? Miranda 74 06/21/2021  ? TRIG 125 06/21/2021  ? CHOLHDL 2.5 06/21/2021  ?Patient was started on Crestor 10 mg daily at last visit ?Patient denies any adverse reaction to medication ?Check lipid panel today. ?

## 2021-09-09 NOTE — Progress Notes (Signed)
? ?  Diamond Robbins     MRN: [id number]      DOB: Mar 05, 1991 ? ? ?HPI ?Diamond Robbins with past medical history of type 1 diabetes, hyperlipidemia due to type 1 diabetes, chronic hypertension, multiple myeloma is here for follow up for hypertension and hyperlipidemia.  ? ?Patient stated that she has been taking her medications as ordered she has established care with endocrinology for management of her type 1 diabetes patient denies hypoglycemic episodes.  ? ?She has been taking metoprolol 22m daily , states that she is taking med for BP and that her heart rate was elevated one time when she was in the hospital  ? ? ? ?No specific complaints today.  Patient is due for Tdap vaccine need for vaccine discussed with patient patient encouraged to get the vaccine at her pharmacy she verbalized understanding ? ?ROS ?Denies recent fever or chills. ?Denies sinus pressure, nasal congestion, ear pain or sore throat. ?Denies chest congestion, productive cough or wheezing. ?Denies chest pains, palpitations and leg swelling ?Denies abdominal pain, nausea, vomiting,diarrhea or constipation.   ?Denies dysuria, frequency, hesitancy or incontinence. ?Denies joint pain, swelling and limitation in mobility. ?Denies depression, anxiety or insomnia. ? ? ? ?PE ? ?BP 128/89 (BP Location: Right Arm, Patient Position: Sitting, Cuff Size: Large)   Pulse 91   Ht _0  (1.6 m)   Wt 178 lb (80.7 kg)   LMP  (LMP Unknown)   SpO2 100%   BMI 31.53 kg/m?  ? ?Patient alert and oriented and in no cardiopulmonary distress. ? ?Chest: Clear to auscultation bilaterally. ? ?CVS: S1, S2 no murmurs, no S3.Regular rate. ? ?ABD: Soft non tender.  ? ?Ext: No edema ? ?MS: Adequate ROM spine, shoulders, hips and knees. ? ?Psych: Good eye contact, normal affect. Memory intact not anxious or depressed appearing. ? ? ?Assessment & Plan ? ?Chronic hypertension ?BP Readings from Last 3 Encounters:  ?09/09/21 128/89  ?09/08/21 130/89  ?08/11/21 125/88  ?Chronic  condition well-controlled on amlodipine 10 mg daily, metoprolol 25 mg daily ?Patient not on ARB or ACEI ?Patient told to start taking losartan 25 mg daily, amlodipine 5 mg daily, metoprolol 12.5 mg twice daily. ?Patient told to monitor blood pressure at home goal is BP less than 130/80 ?Follow-up in 4 weeks in the office to recheck blood pressure ?DASH diet advised engage in regular daily exercises at least 130 minutes weekly  ?Check BMP in 2 weeks. ? ?T1[place] (type 1 diabetes mellitus) [place] ?Followed by endocrinology ?On NovoLog sliding scale, Lantus 30 units daily ?Patient denies hypoglycemic episodes ?Avoid sugar sweets soda ?Started on losartan 25 mg daily ?Currently on Crestor 10 mg daily ? ?Hyperlipidemia due to type 1 diabetes mellitus ([place] ?Lab Results  ?Component Value Date  ? CHOL 158 06/21/2021  ? HDL 62 06/21/2021  ? LGlen Ellen74 06/21/2021  ? TRIG 125 06/21/2021  ? CHOLHDL 2.5 06/21/2021  ?Patient was started on Crestor 10 mg daily at last visit ?Patient denies any adverse reaction to medication ?Check lipid panel today.  ?

## 2021-09-09 NOTE — Assessment & Plan Note (Signed)
Followed by endocrinology ?On NovoLog sliding scale, Lantus 30 units daily ?Patient denies hypoglycemic episodes ?Avoid sugar sweets soda ?Started on losartan 25 mg daily ?Currently on Crestor 10 mg daily ?

## 2021-09-09 NOTE — Assessment & Plan Note (Addendum)
BP Readings from Last 3 Encounters:  ?09/09/21 128/89  ?09/08/21 130/89  ?08/11/21 125/88  ?Chronic condition well-controlled on amlodipine 10 mg daily, metoprolol 25 mg daily ?Patient not on ARB or ACEI ?Patient told to start taking losartan 25 mg daily, amlodipine 5 mg daily, metoprolol 12.5 mg twice daily. ?Patient told to monitor blood pressure at home goal is BP less than 130/80 ?Follow-up in 4 weeks in the office to recheck blood pressure ?DASH diet advised engage in regular daily exercises at least 130 minutes weekly  ?Check BMP in 2 weeks. ?

## 2021-09-09 NOTE — Patient Instructions (Addendum)
Please start taking amlodipine '5mg'$  daily, losartan '25mg'$  daily, metoprolol 12.'5mg'$  twice daily.  ?Pleas get your TDAP vaccine at your pharmacy  ? ?It is important that you exercise regularly at least 30 minutes 5 times a week.  ?Think about what you will eat, plan ahead. ?Choose " clean, green, fresh or frozen" over canned, processed or packaged foods which are more sugary, salty and fatty. ?70 to 75% of food eaten should be vegetables and fruit. ?Three meals at set times with snacks allowed between meals, but they must be fruit or vegetables. ?Aim to eat over a 12 hour period , example 7 am to 7 pm, and STOP after  your last meal of the day. ?Drink water,generally about 64 ounces per day, no other drink is as healthy. Fruit juice is best enjoyed in a healthy way, by EATING the fruit. ? ?Thanks for choosing [facility], we consider it a privelige to serve you.  ?

## 2021-09-13 DIAGNOSIS — Z419 Encounter for procedure for purposes other than remedying health state, unspecified: Secondary | ICD-10-CM | POA: Diagnosis not present

## 2021-09-14 ENCOUNTER — Encounter: Payer: Self-pay | Admitting: Nurse Practitioner

## 2021-09-14 ENCOUNTER — Ambulatory Visit (INDEPENDENT_AMBULATORY_CARE_PROVIDER_SITE_OTHER): Payer: Medicaid Other | Admitting: Nurse Practitioner

## 2021-09-14 ENCOUNTER — Encounter: Payer: Medicaid Other | Attending: Nurse Practitioner | Admitting: Nutrition

## 2021-09-14 VITALS — BP 122/86 | HR 79 | Ht 63.0 in | Wt 180.0 lb

## 2021-09-14 VITALS — Ht 63.0 in | Wt 180.0 lb

## 2021-09-14 DIAGNOSIS — E782 Mixed hyperlipidemia: Secondary | ICD-10-CM

## 2021-09-14 DIAGNOSIS — I10 Essential (primary) hypertension: Secondary | ICD-10-CM

## 2021-09-14 DIAGNOSIS — E1069 Type 1 diabetes mellitus with other specified complication: Secondary | ICD-10-CM

## 2021-09-14 DIAGNOSIS — Z713 Dietary counseling and surveillance: Secondary | ICD-10-CM | POA: Insufficient documentation

## 2021-09-14 DIAGNOSIS — E1010 Type 1 diabetes mellitus with ketoacidosis without coma: Secondary | ICD-10-CM

## 2021-09-14 DIAGNOSIS — C9000 Multiple myeloma not having achieved remission: Secondary | ICD-10-CM

## 2021-09-14 DIAGNOSIS — E1065 Type 1 diabetes mellitus with hyperglycemia: Secondary | ICD-10-CM | POA: Insufficient documentation

## 2021-09-14 DIAGNOSIS — Z7689 Persons encountering health services in other specified circumstances: Secondary | ICD-10-CM | POA: Diagnosis not present

## 2021-09-14 DIAGNOSIS — E669 Obesity, unspecified: Secondary | ICD-10-CM

## 2021-09-14 MED ORDER — NOVOLOG FLEXPEN 100 UNIT/ML ~~LOC~~ SOPN: 5.0000 [IU] | PEN_INJECTOR | Freq: Three times a day (TID) | SUBCUTANEOUS | 3 refills | Status: DC

## 2021-09-14 MED ORDER — INSULIN GLARGINE 100 UNIT/ML SOLOSTAR PEN: 35.0000 [IU] | PEN_INJECTOR | Freq: Every day | SUBCUTANEOUS | 2 refills | Status: DC

## 2021-09-14 NOTE — Patient Instructions (Signed)
Goals ? ?Eat 45 g of carbs per meal ?Keep drinking water ?Keep exercising ?Reduce the fat content ?Get A1C down to 7% ? ?

## 2021-09-14 NOTE — Progress Notes (Signed)
Medical Nutrition Therapy  Follow up Appointment Start time:  1000 Appointment End time:  1030 Primary concerns today: Diabetes Type 1  Referral diagnosis: E10.8 Preferred learning style: Viscual and reading  Learning readiness: Change in progress )   [place], [place] today. Changes: Eating more vegetables, drinking more water(100 oz), has been exercising 30 minutes 3-5 times per week.  Energy level is better.  Saw Whitney and increase Lantus to 35 units and 5 units of Novolog with meals. Using slidinig scale. Had only 1 bs of 70 and it was because she waited too long to eat. . BS's in the 170's or less.  She presents today with her CGM showing slowly improving glycemic profile overall.  She was not due for another A1c today.  Analysis of her CGM shows TIR 34%, TAR 66%, TBR 0% with a GMI of 8.1%.  She denies any hypoglycemia.  She is still adjusting to the prandial insulin.   Got the dexcom to start wearing today. Her husband will help her put in on.  She feels better. Sleeping better. Has been walking a lot more during the day.   Has Multiple Myeloma and gets monthly injections. In remission.  Anthropometrics  Wt Readings from Last 3 Encounters:  09/14/21 180 lb (81.6 kg)  09/09/21 178 lb (80.7 kg)  09/08/21 179 lb 11.2 oz (81.5 kg)   Ht Readings from Last 3 Encounters:  09/14/21 5' 3"  (1.6 m)  09/09/21 5' 3"  (1.6 m)  09/08/21 5' 3"  (1.6 m)   There is no height or weight on file to calculate BMI. @BMIFA @ Facility age limit for growth percentiles is 20 years. Facility age limit for growth percentiles is 20 years.    Clinical Medical Hx: Type 1 DM Dx in 2021. Mulitiple Myeloma Medications:  Labs:  Lab Results  Component Value Date   HGBA1C 10.0 (H) 06/21/2021      Latest Ref Rng & Units 09/08/2021   10:19 AM 08/30/2021    2:11 PM 08/11/2021    8:54 AM  CMP  Glucose 70 - 99 mg/dL 191   192   271    BUN 6 - 20 mg/dL 11   13   12      Creatinine 0.44 - 1.00 mg/dL 0.61   0.68   0.63    Sodium 135 - 145 mmol/L 136   139   136    Potassium 3.5 - 5.1 mmol/L 3.8   3.7   3.8    Chloride 98 - 111 mmol/L 101   105   104    CO2 22 - 32 mmol/L 25   25   23     Calcium 8.9 - 10.3 mg/dL 9.7   9.6   9.5    Total Protein 6.5 - 8.1 g/dL 8.8   8.6   8.3    Total Bilirubin 0.3 - 1.2 mg/dL 0.5   0.3   0.4    Alkaline Phos 38 - 126 U/L 86   83   96    AST 15 - 41 U/L 28   26   24     ALT 0 - 44 U/L 42   37   33      Notable Signs/Symptoms: NOne  Lifestyle & Dietary Hx Lives with her husband. 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   24-Hr Dietary Recall First Meal: 2 eggs on bun, grapes 10, water Snack:  Second Meal: broccoli, chicken and cheese casserole, water Snack:  Third Meal: Pizza sausage and pepperoni-2,  water Beverages: water  Estimated Energy Needs Calories: 1200 Carbohydrate: 135g Protein: 90g Fat: 33g   NUTRITION DIAGNOSIS  NB-1.1 Food and nutrition-related knowledge deficit As related to Diabetes Type 2.  As evidenced by A1C 10%..   NUTRITION INTERVENTION  Nutrition education (E-1) on the following topics:  Nutrition and Diabetes education provided on My Plate, CHO counting, meal planning, portion sizes, timing of meals, avoiding snacks between meals unless having a low blood sugar, target ranges for A1C and blood sugars, signs/symptoms and treatment of hyper/hypoglycemia, monitoring blood sugars, taking medications as prescribed, benefits of exercising 30 minutes per day and prevention of complications of DM. Lifestyle Medicine - Whole Food, Plant Predominant Nutrition is highly recommended: Eat Plenty of vegetables, Mushrooms, fruits, Legumes, Whole Grains, Nuts, seeds in lieu of processed meats, processed snacks/pastries  red meat, poultry, eggs.    -It is better to avoid simple carbohydrates including: Cakes, Sweet Desserts, Ice Cream, Soda (diet and regular), Sweet Tea, Candies, Chips, Cookies, Store Bought Juices, Alcohol in Excess of  1-2 drinks a day, Lemonade,  Artificial Sweeteners, Doughnuts, Coffee Creamers, "Sugar-free" Products, etc, etc.  This is not a complete list.....  Exercise: If you are able: 30 -60 minutes a day ,4 days a week, or 150 minutes a week.  The longer the better.  Combine stretch, strength, and aerobic activities.  If you were told in the past that you have high risk for cardiovascular diseases, you may seek evaluation by your heart doctor prior to initiating moderate to intense exercise programs.    Handouts Provided Include  Grocery list for plant based foods.  Learning Style & Readiness for Change Teaching method utilized: Visual & Auditory  Demonstrated degree of understanding via: Teach Back  Barriers to learning/adherence to lifestyle change: non3  Goals  Established by Pt  Eat 45 g of carbs per meal Keep drinking water Keep exercising Reduce the fat content Get A1C down to 7%  Lifestyle Medicine - Whole Food, Plant Predominant Nutrition is highly recommended: Eat Plenty of vegetables, Mushrooms, fruits, Legumes, Whole Grains, Nuts, seeds in lieu of processed meats, processed snacks/pastries red meat, poultry, eggs.    -It is better to avoid simple carbohydrates including: Cakes, Sweet Desserts, Ice Cream, Soda (diet and regular), Sweet Tea, Candies, Chips, Cookies, Store Bought Juices, Alcohol in Excess of  1-2 drinks a day, Lemonade,  Artificial Sweeteners, Doughnuts, Coffee Creamers, "Sugar-free" Products, etc, etc.  This is not a complete list.....  Exercise: If you are able: 30 -60 minutes a day ,4 days a week, or 150 minutes a week.  The longer the better.  Combine stretch, strength, and aerobic activities.  If you were told in the past that you have high risk for  cardiovascular diseases, you may seek evaluation by your heart doctor prior to initiating moderate to intense exercise programs.   MONITORING & EVALUATION Dietary intake, weekly physical activity, and blood sugars in 3 month.  Next Steps  Patient is to work on meal planning.

## 2021-09-14 NOTE — Patient Instructions (Signed)
Diabetes Mellitus and Foot Care Foot care is an important part of your health, especially when you have diabetes. Diabetes may cause you to have problems because of poor blood flow (circulation) to your feet and legs, which can cause your skin to: Become thinner and drier. Break more easily. Heal more slowly. Peel and crack. You may also have nerve damage (neuropathy) in your legs and feet, causing decreased feeling in them. This means that you may not notice minor injuries to your feet that could lead to more serious problems. Noticing and addressing any potential problems early is the best way to prevent future foot problems. How to care for your feet Foot hygiene  Wash your feet daily with warm water and mild soap. Do not use hot water. Then, pat your feet and the areas between your toes until they are completely dry. Do not soak your feet as this can dry your skin. Trim your toenails straight across. Do not dig under them or around the cuticle. File the edges of your nails with an emery board or nail file. Apply a moisturizing lotion or petroleum jelly to the skin on your feet and to dry, brittle toenails. Use lotion that does not contain alcohol and is unscented. Do not apply lotion between your toes. Shoes and socks Wear clean socks or stockings every day. Make sure they are not too tight. Do not wear knee-high stockings since they may decrease blood flow to your legs. Wear shoes that fit properly and have enough cushioning. Always look in your shoes before you put them on to be sure there are no objects inside. To break in new shoes, wear them for just a few hours a day. This prevents injuries on your feet. Wounds, scrapes, corns, and calluses  Check your feet daily for blisters, cuts, bruises, sores, and redness. If you cannot see the bottom of your feet, use a mirror or ask someone for help. Do not cut corns or calluses or try to remove them with medicine. If you find a minor scrape,  cut, or break in the skin on your feet, keep it and the skin around it clean and dry. You may clean these areas with mild soap and water. Do not clean the area with peroxide, alcohol, or iodine. If you have a wound, scrape, corn, or callus on your foot, look at it several times a day to make sure it is healing and not infected. Check for: Redness, swelling, or pain. Fluid or blood. Warmth. Pus or a bad smell. General tips Do not cross your legs. This may decrease blood flow to your feet. Do not use heating pads or hot water bottles on your feet. They may burn your skin. If you have lost feeling in your feet or legs, you may not know this is happening until it is too late. Protect your feet from hot and cold by wearing shoes, such as at the beach or on hot pavement. Schedule a complete foot exam at least once a year (annually) or more often if you have foot problems. Report any cuts, sores, or bruises to your health care provider immediately. Where to find more information American Diabetes Association: www.diabetes.org Association of Diabetes Care & Education Specialists: www.diabeteseducator.org Contact a health care provider if: You have a medical condition that increases your risk of infection and you have any cuts, sores, or bruises on your feet. You have an injury that is not healing. You have redness on your legs or feet. You   feel burning or tingling in your legs or feet. You have pain or cramps in your legs and feet. Your legs or feet are numb. Your feet always feel cold. You have pain around any toenails. Get help right away if: You have a wound, scrape, corn, or callus on your foot and: You have pain, swelling, or redness that gets worse. You have fluid or blood coming from the wound, scrape, corn, or callus. Your wound, scrape, corn, or callus feels warm to the touch. You have pus or a bad smell coming from the wound, scrape, corn, or callus. You have a fever. You have a red  line going up your leg. Summary Check your feet every day for blisters, cuts, bruises, sores, and redness. Apply a moisturizing lotion or petroleum jelly to the skin on your feet and to dry, brittle toenails. Wear shoes that fit properly and have enough cushioning. If you have foot problems, report any cuts, sores, or bruises to your health care provider immediately. Schedule a complete foot exam at least once a year (annually) or more often if you have foot problems. This information is not intended to replace advice given to you by your health care provider. Make sure you discuss any questions you have with your health care provider. Document Revised: 11/21/2019 Document Reviewed: 11/21/2019 Elsevier Patient Education  2023 Elsevier Inc.  

## 2021-09-14 NOTE — Progress Notes (Signed)
? ?                                                    Endocrinology Follow Up Note  ?     09/14/2021, 10:38 AM ? ? ?Subjective:  ? ? Patient ID: Diamond Robbins, female    DOB: [birth year].  ?Adreanna Fickel is being seen in follow up after being seen in consultation for management of currently uncontrolled symptomatic diabetes requested by  Renee Rival, FNP. ? ? ?Past Medical History:  ?Diagnosis Date  ?? Cancer ([place]) 08/01/2020  ? multiple myeloma  ?? DKA (diabetic ketoacidosis) ([place]) 02/23/2020  ?? Hypertension   ?? Type 1 diabetes mellitus ([place]) 02/23/2020  ? ? ?Past Surgical History:  ?Procedure Laterality Date  ?? BONE MARROW TRANSPLANT    ?? NO PAST SURGERIES    ? ? ?Social History  ? ?Socioeconomic History  ?? Marital status: Married  ?  Spouse name: Not on file  ?? Number of children: Not on file  ?? Years of education: Not on file  ?? Highest education level: Not on file  ?Occupational History  ?? Occupation: unemployed  ?  Comment: can go back to work Jan 2023  ?Tobacco Use  ?? Smoking status: Never  ?? Smokeless tobacco: Never  ?Vaping Use  ?? Vaping Use: Never used  ?Substance and Sexual Activity  ?? Alcohol use: Not Currently  ?  Comment: occ- 0-1 drinks per week; maybe 1 drink every 6 months  ?? Drug use: No  ?? Sexual activity: Yes  ?  Birth control/protection: Condom  ?Other Topics Concern  ?? Not on file  ?Social History Narrative  ? Has 2 step-daughters  ? ?Social Determinants of Health  ? ?Financial Resource Strain: Not on file  ?Food Insecurity: No Food Insecurity  ?? Worried About Charity fundraiser in the Last Year: Never true  ?? Ran Out of Food in the Last Year: Never true  ?Transportation Needs: No Transportation Needs  ?? Lack of Transportation (Medical): No  ?? Lack of Transportation (Non-Medical): No  ?Physical Activity: Sufficiently Active  ?? Days of Exercise per Week: 3 days  ?? Minutes of Exercise per Session: 60 min  ?Stress: Not on file  ?Social Connections:  Socially Integrated  ?? Frequency of Communication with Friends and Family: More than three times a week  ?? Frequency of Social Gatherings with Friends and Family: More than three times a week  ?? Attends Religious Services: More than 4 times per year  ?? Active Member of Clubs or Organizations: Yes  ?? Attends Archivist Meetings: Never  ?? Marital Status: Married  ? ? ?Family History  ?Problem Relation Age of Onset  ?? Hypertension Mother   ?? Diabetes Mother   ?? Hypertension Father   ?? Diabetes Maternal Grandmother   ? ? ?Outpatient Encounter Medications as of 09/14/2021  ?Medication Sig  ?? ACCU-CHEK GUIDE test strip Use to check blood glucose 4 times daily.  ?? Accu-Chek Softclix Lancets lancets Use to check blood glucose 4 times daily.  ?? acyclovir (ZOVIRAX) 800 MG tablet Take 800 mg by mouth daily.  ?? amLODipine (NORVASC) 5 MG tablet Take 1 tablet (5 mg total) by mouth daily.  ?? Aspirin 81 MG CAPS Take 81 mg by mouth daily.  ?? Blood Glucose Monitoring Suppl (ACCU-CHEK  GUIDE) w/Device KIT   ?? Blood Pressure Monitor KIT Use to check blood pressure as instructed  ?? calcium carbonate (OS-CAL) 1250 (500 Ca) MG chewable tablet Chew 1 tablet by mouth daily.  ?? cetirizine (ZYRTEC) 10 MG tablet Take 1 tablet by mouth once daily  ?? cholecalciferol (VITAMIN D3) 25 MCG (1000 UNIT) tablet Take 1,000 Units by mouth daily.  ?? Continuous Blood Gluc Sensor (DEXCOM G6 SENSOR) MISC Change sensor every 10 days as directed  ?? Continuous Blood Gluc Transmit (DEXCOM G6 TRANSMITTER) MISC Change transmitter every 90 days as directed.  ?? fluticasone (FLONASE) 50 MCG/ACT nasal spray Place 2 sprays into both nostrils daily.  ?? folic acid (FOLVITE) 1 MG tablet Take 1 tablet by mouth once daily  ?? insulin aspart (NOVOLOG FLEXPEN) 100 UNIT/ML FlexPen Inject 5-11 Units into the skin 3 (three) times daily with meals.  ?? insulin glargine (LANTUS) 100 UNIT/ML Solostar Pen Inject 35 Units into the skin at bedtime.  ??  lenalidomide (REVLIMID) 10 MG capsule Take 1 capsule daily for 21 days, then take 7 days off  ?? losartan (COZAAR) 25 MG tablet Take 1 tablet (25 mg total) by mouth daily.  ?? metoprolol tartrate (LOPRESSOR) 25 MG tablet Take 0.5 tablets (12.5 mg total) by mouth 2 (two) times daily.  ?? Multiple Vitamin (MULTIVITAMIN) tablet Take 1 tablet by mouth daily.  ?? rosuvastatin (CRESTOR) 10 MG tablet Take 1 tablet (10 mg total) by mouth daily.  ?? UNABLE TO FIND Blood pressure cuff DX I10  ?? [DISCONTINUED] insulin aspart (NOVOLOG FLEXPEN) 100 UNIT/ML FlexPen Inject 5-8 Units into the skin 3 (three) times daily with meals.  ?? [DISCONTINUED] insulin glargine (LANTUS) 100 UNIT/ML Solostar Pen Inject 20 Units into the skin daily. (Patient taking differently: Inject 30 Units into the skin daily.)  ? ?No facility-administered encounter medications on file as of 09/14/2021.  ? ? ?ALLERGIES: ?No Known Allergies ? ?VACCINATION STATUS: ?Immunization History  ?Administered Date(s) Administered  ?? DTaP / HiB / IPV 03/24/2021  ?? DTaP / IPV 07/16/2021  ?? Hepatitis B, adult 03/24/2021, 07/16/2021  ?? HiB (PRP-T) 07/16/2021  ?? Influenza,inj,Quad PF,6+ Mos 03/16/2021  ?? Pneumococcal Conjugate-13 03/24/2021, 07/16/2021  ? ? ?Diabetes ?She presents for her follow-up diabetic visit. She has type 1 diabetes mellitus. Onset time: Diagnosed at approx age of 6. Her disease course has been improving. There are no hypoglycemic associated symptoms. Associated symptoms include blurred vision, fatigue, polydipsia and polyuria. Pertinent negatives for diabetes include no weight loss. There are no hypoglycemic complications. Symptoms are improving. There are no diabetic complications. Risk factors for coronary artery disease include diabetes mellitus, dyslipidemia, hypertension and sedentary lifestyle. Current diabetic treatment includes intensive insulin program. She is compliant with treatment most of the time. Her weight is fluctuating  minimally. She is following a generally healthy diet. When asked about meal planning, she reported none. She has not had a previous visit with a dietitian. She participates in exercise intermittently. Her home blood glucose trend is decreasing steadily. Her breakfast blood glucose range is generally 180-200 mg/dl. Her overall blood glucose range is 180-200 mg/dl. (She presents today with her CGM showing slowly improving glycemic profile overall.  She was not due for another A1c today.  Analysis of her CGM shows TIR 34%, TAR 66%, TBR 0% with a GMI of 8.1%.  She denies any hypoglycemia.  She is still adjusting to the prandial insulin.  ) An ACE inhibitor/angiotensin II receptor blocker is not being taken. She does not see  a podiatrist.Eye exam is current.  ?Hypertension ?This is a chronic problem. The current episode started more than 1 year ago. The problem has been resolved since onset. The problem is controlled. Associated symptoms include blurred vision. There are no associated agents to hypertension. Risk factors for coronary artery disease include diabetes mellitus, dyslipidemia, family history and sedentary lifestyle. Past treatments include beta blockers and calcium channel blockers. The current treatment provides moderate improvement. Compliance problems include exercise and diet.   ?Hyperlipidemia ?This is a chronic problem. The current episode started more than 1 year ago. The problem is controlled. Recent lipid tests were reviewed and are normal. Exacerbating diseases include diabetes and obesity. Factors aggravating her hyperlipidemia include beta blockers and fatty foods. Current antihyperlipidemic treatment includes statins. The current treatment provides moderate improvement of lipids. Compliance problems include adherence to diet and adherence to exercise.  Risk factors for coronary artery disease include diabetes mellitus, dyslipidemia, family history, female sex, hypertension and a sedentary  lifestyle.  ? ? ?Review of systems ? ?Constitutional: + Minimally fluctuating body weight,  current Body mass index is 31.89 kg/m?. , + fatigue, no subjective hyperthermia, no subjective hypothermia ?Eyes: no blurry v

## 2021-09-16 DIAGNOSIS — Z9484 Stem cells transplant status: Secondary | ICD-10-CM | POA: Diagnosis not present

## 2021-09-16 DIAGNOSIS — Z79899 Other long term (current) drug therapy: Secondary | ICD-10-CM | POA: Diagnosis not present

## 2021-09-16 DIAGNOSIS — Z23 Encounter for immunization: Secondary | ICD-10-CM | POA: Diagnosis not present

## 2021-09-16 DIAGNOSIS — C9000 Multiple myeloma not having achieved remission: Secondary | ICD-10-CM | POA: Diagnosis not present

## 2021-09-16 DIAGNOSIS — C9001 Multiple myeloma in remission: Secondary | ICD-10-CM | POA: Diagnosis not present

## 2021-09-16 DIAGNOSIS — Z48290 Encounter for aftercare following bone marrow transplant: Secondary | ICD-10-CM | POA: Diagnosis not present

## 2021-09-16 DIAGNOSIS — Z7689 Persons encountering health services in other specified circumstances: Secondary | ICD-10-CM | POA: Diagnosis not present

## 2021-09-16 DIAGNOSIS — Z794 Long term (current) use of insulin: Secondary | ICD-10-CM | POA: Diagnosis not present

## 2021-09-16 DIAGNOSIS — Z7982 Long term (current) use of aspirin: Secondary | ICD-10-CM | POA: Diagnosis not present

## 2021-09-17 ENCOUNTER — Ambulatory Visit: Payer: Medicaid Other | Admitting: Nurse Practitioner

## 2021-09-24 ENCOUNTER — Inpatient Hospital Stay (HOSPITAL_COMMUNITY): Payer: Medicaid Other | Attending: Hematology

## 2021-09-24 ENCOUNTER — Other Ambulatory Visit (HOSPITAL_COMMUNITY): Payer: Self-pay

## 2021-09-24 DIAGNOSIS — C9000 Multiple myeloma not having achieved remission: Secondary | ICD-10-CM | POA: Insufficient documentation

## 2021-09-24 LAB — PREGNANCY, URINE: Preg Test, Ur: NEGATIVE

## 2021-09-24 MED ORDER — LENALIDOMIDE 10 MG PO CAPS: ORAL_CAPSULE | ORAL | 0 refills | Status: DC

## 2021-09-24 NOTE — Telephone Encounter (Signed)
Chart reviewed. Revlimid refilled per last office note with Dr. Katragadda.  

## 2021-09-28 ENCOUNTER — Other Ambulatory Visit: Payer: Self-pay | Admitting: Pharmacist

## 2021-09-28 DIAGNOSIS — E1010 Type 1 diabetes mellitus with ketoacidosis without coma: Secondary | ICD-10-CM

## 2021-09-28 DIAGNOSIS — E1069 Type 1 diabetes mellitus with other specified complication: Secondary | ICD-10-CM

## 2021-09-28 DIAGNOSIS — I10 Essential (primary) hypertension: Secondary | ICD-10-CM

## 2021-09-28 NOTE — Patient Instructions (Signed)
Diamond Robbins,  ? ?It was great talking to you today! ? ?Congratulations on the improvement in your blood sugars and blood pressures! ? ?Check your blood pressure once weekly, and any time you have concerning symptoms like headache, chest pain, dizziness, shortness of breath, or vision changes.  ? ?Our goal is less than 130/80. ? ?To appropriately check your blood pressure, make sure you do the following:  ?1) Avoid caffeine, exercise, or tobacco products for 30 minutes before checking. Empty your bladder. ?2) Sit with your back supported in a flat-backed chair. Rest your arm on something flat (arm of the chair, table, etc). ?3) Sit still with your feet flat on the floor, resting, for at least 5 minutes.  ?4) Check your blood pressure. Take 1-2 readings.  ?5) Write down these readings and bring with you to any provider appointments. ? ?Bring your home blood pressure machine with you to a provider's office for accuracy comparison at least once a year.  ? ?Make sure you take your blood pressure medications before you come to any office visit, even if you were asked to fast for labs. ? ?Talk to your community pharmacist about the Tdap vaccine and COVID vaccines.  ? ?Take care! ? ?[name], PharmD, BCACP ?[place] ?[phone] ? ?

## 2021-09-28 NOTE — Chronic Care Management (AMB) (Signed)
? ?   ? ?Chief Complaint  ?Patient presents with  ? Diabetes  ? Hypertension  ? ? ?Diamond Robbins is a 31 y.o. year old female who was referred for medication management by their primary care provider, [facility], Dewaine Conger, FNP. They presented for a telephone visit. ?  ?They were referred to the pharmacist by their PCP for assistance in managing diabetes and hypertension.  ? ? ?Subjective: ? ?Care Team: ?Primary Care Provider: Renee Rival, FNP ; Next Scheduled Visit: 10/12/21 ?Endocrinologist Re[place]; Next Scheduled Visit: 12/15/21 ?Hematology/Oncology: Delton Coombes; Next Scheduled Visit: 12/01/21 ? ?Medication Access/Adherence ? ?Current Pharmacy:  ?[facility], [place] [address] ?[address] ?[place] [address] ?Phone: [phone] Fax: [phone] ? ?[place] - [address] ?[place] ?[place] [address] ?Phone: [phone] Fax: [phone] ? ?[id number] - [place], [address] ?[place] ?[place] [address] ?Phone: [phone] Fax: [phone] ? ?[place], [place] ?[place] [address] ?Phone: [phone] Fax: [phone] ? ? ?Patient reports affordability concerns with their medications: No  ?Patient reports access/transportation concerns to their pharmacy: No  ?Patient reports adherence concerns with their medications:  No   ? ? ?Diabetes: ? ?Current medications: Lantus 35 units daily, Novolog 5-11 units per sliding scale  ? ?Current glucose readings: fastings ~ 110-120s; post prandials: 120s ? ?Using DexCom CGM.  ? ?Patient denies hypoglycemic s/sx including dizziness, shakiness, sweating. Patient denies hyperglycemic symptoms including polyuria, polydipsia, polyphagia, nocturia, neuropathy, blurred vision. ? ?Current meal patterns:  ?- Breakfast: eggs, piece of toast,  bacon/sausage; water mostly, sometimes low sugar juice ?- Lunch: grilled cheese, hot dog;  ?- Supper: chicken, vegetable, rice;  ?- Snacks: honey roasted peanuts ?- Drinks: water, low sugar juice ? ?Current physical activity: coaches football; also walks during the day; tries to walk  ? ?Hypertension: ? ?Current medications: losartan 25 mg daily, metoprolol tartrate 12.5 mg twice daily, amlodipine 5 mg daily ? ?Patient has a validated, automated, upper arm home BP cuff ?Current blood pressure readings readings: reports they are at goal ? ?Patient denies hypotensive s/sx including dizziness, lightheadedness.  ? ? ?Hyperlipidemia/ASCVD Risk Reduction ? ?Current lipid lowering medications: rosuvastatin 10 mg daily - but per fill history, pharmacy last filled 20 mg dose ? ?Antiplatelet regimen: aspirin 81 mg daily ? ?Health Maintenance ? ?Health Maintenance Due  ?Topic Date Due  ? COVID-19 Vaccine (1) Never done  ? TETANUS/TDAP  Never done  ?  ? ?Objective: ?Lab Results  ?Component Value Date  ? HGBA1C 10.0 (H) 06/21/2021  ? ? ?Lab Results  ?Component Value Date  ? CREATININE 0.61 09/08/2021  ? BUN 11 09/08/2021  ? NA 136 09/08/2021  ? K 3.8 09/08/2021  ? CL 101 09/08/2021  ? CO2 25 09/08/2021  ? ? ?Lab Results  ?Component Value Date  ? CHOL 158 06/21/2021  ? HDL 62 06/21/2021  ? Freeman 74 06/21/2021  ? TRIG 125 06/21/2021  ? CHOLHDL 2.5 06/21/2021  ? ? ?Medications Reviewed Today   ? ? Reviewed by Osker Mason, RPH-CPP (Pharmacist) on 09/28/21 at [place] List Status: <None>  ? ?Medication Order Taking? Sig Documenting Provider Last Dose Status Informant  ?ACCU-CHEK GUIDE test strip [id number] Yes Use to check blood glucose 4 times daily. Marinus Maw,  Juanetta Beets, NP Taking Active   ?Accu-Chek Softclix Lancets lancets [id number] Yes Use to check blood glucose 4 times daily. Brita Romp, NP Taking Active   ?acyclovir (ZOVIRAX) 800 MG tablet [id number] Yes Take 800 mg by mouth daily. [provider]  Taking Active   ?amLODipine (NORVASC) 5 MG tablet [id number] Yes Take 1 tablet (5 mg total) by mouth daily. Renee Rival, FNP Taking Active   ?Aspirin 81 MG CAPS [id number] Yes Take 81 mg by mouth daily. [provider] Taking Active   ?Blood Glucose Monitoring Suppl (ACCU-CHEK GUIDE) w/Device KIT [id number] Yes  [provider] Taking Active Self  ?Blood Pressure Monitor KIT [id number] Yes Use to check blood pressure as instructed Fayrene Helper, MD Taking Active   ?calcium carbonate (OS-CAL) 1250 (500 Ca) MG chewable tablet [id number] Yes Chew 1 tablet by mouth daily. [provider] Taking Active   ?cetirizine (ZYRTEC) 10 MG tablet [id number] Yes Take 1 tablet by mouth once daily [facility], Dewaine Conger, FNP Taking Active   ?cholecalciferol (VITAMIN D3) 25 MCG (1000 UNIT) tablet [id number] Yes Take 1,000 Units by mouth daily. [provider] Taking Active   ?Continuous Blood Gluc Sensor (DEXCOM G6 SENSOR) MISC [id number] Yes Change sensor every 10 days as directed Brita Romp, NP Taking Active   ?Continuous Blood Gluc Transmit (DEXCOM G6 TRANSMITTER) MISC [id number] Yes Change transmitter every 90 days as directed. Brita Romp, NP Taking Active   ?fluticasone (FLONASE) 50 MCG/ACT nasal spray [id number] Yes Place 2 sprays into both nostrils daily. Renee Rival, FNP Taking Active   ?         ?Med Note Wynetta Emery, Laural Roes   Thu Sep 09, 2021 10:52 AM) As needed  ?folic acid (FOLVITE) 1 MG tablet [id number] Yes Take 1 tablet by mouth once daily [facility], Dewaine Conger, FNP Taking Active   ?insulin aspart (NOVOLOG FLEXPEN) 100 UNIT/ML FlexPen [id number] Yes Inject 5-11 Units into the skin 3 (three) times daily with meals. Brita Romp, NP Taking Active   ?insulin glargine (LANTUS) 100 UNIT/ML Solostar Pen [id number] Yes Inject 35 Units into the skin at bedtime. Brita Romp, NP Taking Active   ?lenalidomide (REVLIMID) 10 MG capsule [id number] Yes Take 1 capsule  daily for 21 days, then take 7 days off [name], MD Taking Active   ?losartan (COZAAR) 25 MG tablet [id number] Yes Take 1 tablet (25 mg total) by mouth daily. Renee Rival, FNP Taking Active   ?metoprolol tartrate (LOPRESSOR) 25 MG tablet [id number] Yes Take 0.5 tablets (12.5 mg total) by mouth 2 (two) times daily. Renee Rival, FNP Taking Active   ?Multiple Vitamin (MULTIVITAMIN) tablet [id number] Yes Take 1 tablet by mouth daily. [provider] Taking Active   ?rosuvastatin (CRESTOR) 10 MG tablet [id number] Yes Take 1 tablet (10 mg total) by mouth daily. Renee Rival, FNP Taking Active   ?         ?Med Note Jodi Mourning, Eternity Dexter T   Tue Sep 28, 2021  9:03 AM) 20 mg   ?UNABLE TO FIND [id number] Yes Blood pressure cuff DX I10 [facility], Dewaine Conger, FNP Taking Active   ?Med List Note [name], CPhT 03/07/13 1442): No preferred pharmacy.   ? ?  ?  ? ?  ? ? ?Assessment/Plan:  ?Care Plan : Medication Management  ?Updates made by Osker Mason, RPH-CPP since 09/28/2021 12:00 AM  ?  ? ?Problem: Diabetes   ?  ? ?Long-Range Goal: A1c <7%   ?  Note:   ?Current Barriers:  ?Unable to achieve control of diabetes  ? ?Patient Needs: ?Collaboration with team to optimize medication regimen ? ?Patient Activities: ?Patient will:  ?- take medications as prescribed as evidenced by patient report and record review ?check glucose continuously using CGM, document, and provide at future appointments ? ?  ? ?Problem: Cardiovascular Risk Reduction   ?  ? ?Long-Range Goal: BP <130/80; LDL <70   ?Note:   ?Current Barriers:  ?Unable to achieve control of lipids, blood pressure  ? ?Patient Needs: ?Collaboration with care providers ? ?Patient Activities: ?Patient will:  ?- check blood pressure periodically, document, and provide at future appointments ?target a minimum of 150 minutes of moderate intensity exercise weekly ? ?  ? ? ?Diabetes: ?- Currently uncontrolled but improving ?- Reviewed goal  time in range and A1c ?- Reviewed dietary modifications including: continued focus on incorporating protein into meals ?- Reviewed lifestyle modifications including: setting goals of increasing physical activity.

## 2021-10-05 ENCOUNTER — Encounter (HOSPITAL_COMMUNITY): Payer: Self-pay

## 2021-10-05 NOTE — Telephone Encounter (Signed)
Please advise 

## 2021-10-06 NOTE — Addendum Note (Signed)
Addended by: Kaylyn Layer T on: 10/06/2021 01:17 PM   Modules accepted: Orders

## 2021-10-12 ENCOUNTER — Ambulatory Visit: Payer: Medicaid Other | Admitting: Nurse Practitioner

## 2021-10-14 DIAGNOSIS — Z419 Encounter for procedure for purposes other than remedying health state, unspecified: Secondary | ICD-10-CM | POA: Diagnosis not present

## 2021-10-22 ENCOUNTER — Inpatient Hospital Stay (HOSPITAL_COMMUNITY): Payer: Medicaid Other | Attending: Hematology

## 2021-10-22 ENCOUNTER — Other Ambulatory Visit (HOSPITAL_COMMUNITY): Payer: Self-pay

## 2021-10-22 DIAGNOSIS — C9000 Multiple myeloma not having achieved remission: Secondary | ICD-10-CM | POA: Diagnosis not present

## 2021-10-22 LAB — PREGNANCY, URINE: Preg Test, Ur: NEGATIVE

## 2021-10-22 MED ORDER — LENALIDOMIDE 10 MG PO CAPS
ORAL_CAPSULE | ORAL | 0 refills | Status: DC
Start: 2021-10-22 — End: 2021-11-24

## 2021-10-22 NOTE — Telephone Encounter (Signed)
Chart reviewed. Revlimid refilled per last office note with Dr. Katragadda.  

## 2021-11-09 ENCOUNTER — Other Ambulatory Visit: Payer: Self-pay | Admitting: Pharmacist

## 2021-11-09 NOTE — Chronic Care Management (AMB) (Signed)
Chief Complaint  Patient presents with   Diabetes   Hypertension    Diamond Robbins is a 31 y.o. year old female who presented for a telephone visit.   They were referred to the pharmacist by their High Risk Managed Medicaid Care Team  for assistance in managing diabetes, hypertension, and hyperlipidemia.   Patient is participating in a Managed Medicaid Plan:  Yes  Subjective:  Care Team: Primary Care Provider: Donell Beers, FNP ; Next Scheduled Visit: needs to reschedule Endocrinology: Lurlean Leyden; Next Scheduled Visit: 12/15/21   Medication Access/Adherence  Current Pharmacy:  [facility] [address] [place] - [address] [place] [address] Phone: [phone] Fax: [phone]  [facility] - [place], [place] - [address] [place] [address] Phone: [phone] Fax: [phone]  [facility] [phone] - [place], [place] - [address] AT [facility] [address] [place] [address] Phone: [phone] Fax: [phone]  [facility] - [place], [place] - [address] [place] [address] Phone: [phone] Fax: [phone]   Patient reports affordability concerns with their medications: No  Patient reports access/transportation concerns to their pharmacy: No  Patient reports adherence concerns with their medications:  No     Diabetes:  Current medications: Lantus 35 units daily, Novolog 5-11 units with meals per sliding scale - reports using ~5-7 units most of the time   Current glucose readings: using DexCom G6, reports average ~195  Denies any episodes of hypoglycemia. Reports she generally does have post-prandial readings >180, fastings generally well controlled per her report   Hypertension:  Current medications: losartan 25 mg daily, metoprolol tartrate 12.5 mg twice daily, amlodipine 5 mg  daily   Patient has a validated, automated, upper arm home BP cuff Current blood pressure readings readings: does not recall specific readings  Patient denies hypotensive s/sx including dizziness, lightheadedness.    Hyperlipidemia/ASCVD Risk Reduction  Current lipid lowering medications: rosuvastatin 20 mg daily   Antiplatelet regimen: aspirin 81 mg daily   Health Maintenance  Health Maintenance Due  Topic Date Due   COVID-19 Vaccine (1) Never done   TETANUS/TDAP  Never done     Objective: Lab Results  Component Value Date   HGBA1C 10.0 (H) 06/21/2021    Lab Results  Component Value Date   CREATININE 0.61 09/08/2021   BUN 11 09/08/2021   NA 136 09/08/2021   K 3.8 09/08/2021   CL 101 09/08/2021   CO2 25 09/08/2021    Lab Results  Component Value Date   CHOL 158 06/21/2021   HDL 62 06/21/2021   LDLCALC 74 06/21/2021   TRIG 125 06/21/2021   CHOLHDL 2.5 06/21/2021    Medications Reviewed Today     Reviewed by Alden Hipp, RPH-CPP (Pharmacist) on 09/28/21 at 0906  Med List Status: <None>   Medication Order Taking? Sig Documenting Provider Last Dose Status Informant  ACCU-CHEK GUIDE test strip [id number] Yes Use to check blood glucose 4 times daily. Dani Gobble, NP Taking Active   Accu-Chek Softclix Lancets lancets [id number] Yes Use to check blood glucose 4 times daily. Dani Gobble, NP Taking Active   acyclovir (ZOVIRAX) 800 MG tablet [id number] Yes Take 800 mg by mouth daily. [provider] Taking Active   amLODipine (NORVASC) 5 MG tablet [id number] Yes Take 1 tablet (5 mg total) by  mouth daily. Donell Beers, FNP Taking Active   Aspirin 81 MG CAPS [id number] Yes Take 81 mg by mouth daily. [provider] Taking Active   Blood Glucose Monitoring Suppl (ACCU-CHEK GUIDE) w/Device KIT [id number] Yes  [provider] Taking Active Self  Blood Pressure Monitor KIT [id number] Yes Use to check blood pressure as  instructed Kerri Perches, MD Taking Active   calcium carbonate (OS-CAL) 1250 (500 Ca) MG chewable tablet [id number] Yes Chew 1 tablet by mouth daily. [provider] Taking Active   cetirizine (ZYRTEC) 10 MG tablet [id number] Yes Take 1 tablet by mouth once daily [facility], Baird Kay, FNP Taking Active   cholecalciferol (VITAMIN D3) 25 MCG (1000 UNIT) tablet [id number] Yes Take 1,000 Units by mouth daily. [provider] Taking Active   Continuous Blood Gluc Sensor (DEXCOM G6 SENSOR) MISC [id number] Yes Change sensor every 10 days as directed Dani Gobble, NP Taking Active   Continuous Blood Gluc Transmit (DEXCOM G6 TRANSMITTER) MISC [id number] Yes Change transmitter every 90 days as directed. Dani Gobble, NP Taking Active   fluticasone [facility]) 50 MCG/ACT nasal spray [id number] Yes Place 2 sprays into both nostrils daily. Donell Beers, FNP Taking Active            Med Note Laural Benes, JOLIZA   Thu Sep 09, 2021 10:52 AM) As needed  folic acid (FOLVITE) 1 MG tablet [id number] Yes Take 1 tablet by mouth once daily [facility], Baird Kay, FNP Taking Active   insulin aspart (NOVOLOG FLEXPEN) 100 UNIT/ML FlexPen [id number] Yes Inject 5-11 Units into the skin 3 (three) times daily with meals. Dani Gobble, NP Taking Active   insulin glargine (LANTUS) 100 UNIT/ML Solostar Pen [id number] Yes Inject 35 Units into the skin at bedtime. Dani Gobble, NP Taking Active   lenalidomide (REVLIMID) 10 MG capsule [id number] Yes Take 1 capsule daily for 21 days, then take 7 days off Doreatha Massed, MD Taking Active   losartan (COZAAR) 25 MG tablet [id number] Yes Take 1 tablet (25 mg total) by mouth daily. Donell Beers, FNP Taking Active   metoprolol tartrate (LOPRESSOR) 25 MG tablet [id number] Yes Take 0.5 tablets (12.5 mg total) by mouth 2 (two) times daily. Donell Beers, FNP Taking Active   Multiple Vitamin (MULTIVITAMIN) tablet [id number] Yes Take 1 tablet by  mouth daily. [provider] Taking Active   rosuvastatin (CRESTOR) 10 MG tablet [id number] Yes Take 1 tablet (10 mg total) by mouth daily. Donell Beers, FNP Taking Active            Med Note Alden Hipp   Tue Sep 28, 2021  9:03 AM) 20 mg   UNABLE TO FIND [id number] Yes Blood pressure cuff DX I10 Donell Beers, FNP Taking Active   Med List Note Silvestre Gunner, CPhT 03/07/13 1442): No preferred pharmacy.               Assessment/Plan:   Diabetes: - Currently uncontrolled but improving - Recommend to continue current regimen. Reviewed upcoming endocrinology appointment. - Recommend to check glucose using CGM   Hypertension: - Currently controlled - Reviewed appropriate blood pressure monitoring technique and reviewed goal blood pressure. Recommended to check home blood pressure and heart rate weekly. - Recommend to continue current regimen   Hyperlipidemia/ASCVD Risk Reduction: - Currently controlled. - Recommend to continue current regimen   Reviewed missed PCP appointment from 5/30. Offered to assist patient in rescheduling. She declined, noting she would call the  office to do so.   Follow Up Plan: Follow up call in ~ 8 weeks  Catie Eppie Gibson, PharmD, [facility] [phone]

## 2021-11-11 DIAGNOSIS — Z7689 Persons encountering health services in other specified circumstances: Secondary | ICD-10-CM | POA: Diagnosis not present

## 2021-11-13 DIAGNOSIS — Z419 Encounter for procedure for purposes other than remedying health state, unspecified: Secondary | ICD-10-CM | POA: Diagnosis not present

## 2021-11-24 ENCOUNTER — Inpatient Hospital Stay (HOSPITAL_COMMUNITY): Payer: Medicaid Other | Attending: Hematology

## 2021-11-24 ENCOUNTER — Other Ambulatory Visit (HOSPITAL_COMMUNITY): Payer: Self-pay

## 2021-11-24 DIAGNOSIS — C9000 Multiple myeloma not having achieved remission: Secondary | ICD-10-CM | POA: Insufficient documentation

## 2021-11-24 DIAGNOSIS — Z23 Encounter for immunization: Secondary | ICD-10-CM | POA: Diagnosis not present

## 2021-11-24 DIAGNOSIS — Z7689 Persons encountering health services in other specified circumstances: Secondary | ICD-10-CM | POA: Diagnosis not present

## 2021-11-24 LAB — CBC WITH DIFFERENTIAL/PLATELET
Abs Immature Granulocytes: 0 10*3/uL (ref 0.00–0.07)
Basophils Absolute: 0.1 10*3/uL (ref 0.0–0.1)
Basophils Relative: 2 %
Eosinophils Absolute: 0.2 10*3/uL (ref 0.0–0.5)
Eosinophils Relative: 4 %
HCT: 42.3 % (ref 36.0–46.0)
Hemoglobin: 13.6 g/dL (ref 12.0–15.0)
Immature Granulocytes: 0 %
Lymphocytes Relative: 42 %
Lymphs Abs: 1.4 10*3/uL (ref 0.7–4.0)
MCH: 26.9 pg (ref 26.0–34.0)
MCHC: 32.2 g/dL (ref 30.0–36.0)
MCV: 83.8 fL (ref 80.0–100.0)
Monocytes Absolute: 0.3 10*3/uL (ref 0.1–1.0)
Monocytes Relative: 9 %
Neutro Abs: 1.5 10*3/uL — ABNORMAL LOW (ref 1.7–7.7)
Neutrophils Relative %: 43 %
Platelets: 253 10*3/uL (ref 150–400)
RBC: 5.05 MIL/uL (ref 3.87–5.11)
RDW: 14 % (ref 11.5–15.5)
WBC: 3.4 10*3/uL — ABNORMAL LOW (ref 4.0–10.5)
nRBC: 0 % (ref 0.0–0.2)

## 2021-11-24 LAB — COMPREHENSIVE METABOLIC PANEL
ALT: 48 U/L — ABNORMAL HIGH (ref 0–44)
AST: 30 U/L (ref 15–41)
Albumin: 4.4 g/dL (ref 3.5–5.0)
Alkaline Phosphatase: 72 U/L (ref 38–126)
Anion gap: 8 (ref 5–15)
BUN: 11 mg/dL (ref 6–20)
CO2: 24 mmol/L (ref 22–32)
Calcium: 9.4 mg/dL (ref 8.9–10.3)
Chloride: 106 mmol/L (ref 98–111)
Creatinine, Ser: 0.63 mg/dL (ref 0.44–1.00)
GFR, Estimated: >60 mL/min (ref >60)
Glucose, Bld: 177 mg/dL — ABNORMAL HIGH (ref 70–99)
Potassium: 3.7 mmol/L (ref 3.5–5.1)
Sodium: 138 mmol/L (ref 135–145)
Total Bilirubin: 0.5 mg/dL (ref 0.3–1.2)
Total Protein: 8 g/dL (ref 6.5–8.1)

## 2021-11-24 LAB — MAGNESIUM: Magnesium: 1.8 mg/dL (ref 1.7–2.4)

## 2021-11-24 LAB — PREGNANCY, URINE: Preg Test, Ur: NEGATIVE

## 2021-11-24 LAB — LACTATE DEHYDROGENASE: LDH: 152 U/L (ref 98–192)

## 2021-11-24 MED ORDER — LENALIDOMIDE 10 MG PO CAPS
ORAL_CAPSULE | ORAL | 0 refills | Status: DC
Start: 2021-11-24 — End: 2021-12-17

## 2021-11-24 NOTE — Telephone Encounter (Signed)
Chart reviewed. Revlimid refilled per last office note with Dr. Katragadda.  

## 2021-11-29 ENCOUNTER — Other Ambulatory Visit: Payer: Self-pay | Admitting: Nurse Practitioner

## 2021-11-29 ENCOUNTER — Encounter: Payer: Self-pay | Admitting: Nutrition

## 2021-11-29 LAB — PROTEIN ELECTROPHORESIS, SERUM
A/G Ratio: 1.1 (ref 0.7–1.7)
Albumin ELP: 3.9 g/dL (ref 2.9–4.4)
Alpha-1-Globulin: 0.2 g/dL (ref 0.0–0.4)
Alpha-2-Globulin: 0.9 g/dL (ref 0.4–1.0)
Beta Globulin: 1.3 g/dL (ref 0.7–1.3)
Gamma Globulin: 1.2 g/dL (ref 0.4–1.8)
Globulin, Total: 3.7 g/dL (ref 2.2–3.9)
Total Protein ELP: 7.6 g/dL (ref 6.0–8.5)

## 2021-11-29 LAB — IMMUNOFIXATION ELECTROPHORESIS
IgA: 97 mg/dL (ref 87–352)
IgG (Immunoglobin G), Serum: 1212 mg/dL (ref 586–1602)
IgM (Immunoglobulin M), Srm: 18 mg/dL — ABNORMAL LOW (ref 26–217)
Total Protein ELP: 7.9 g/dL (ref 6.0–8.5)

## 2021-12-01 ENCOUNTER — Inpatient Hospital Stay (HOSPITAL_COMMUNITY): Payer: Medicaid Other

## 2021-12-01 ENCOUNTER — Inpatient Hospital Stay (HOSPITAL_BASED_OUTPATIENT_CLINIC_OR_DEPARTMENT_OTHER): Payer: Medicaid Other | Admitting: Hematology

## 2021-12-01 VITALS — BP 132/83 | HR 77 | Temp 98.1°F | Resp 18 | Ht 63.0 in | Wt 186.1 lb

## 2021-12-01 DIAGNOSIS — Z7689 Persons encountering health services in other specified circumstances: Secondary | ICD-10-CM | POA: Diagnosis not present

## 2021-12-01 DIAGNOSIS — C9000 Multiple myeloma not having achieved remission: Secondary | ICD-10-CM

## 2021-12-01 DIAGNOSIS — Z23 Encounter for immunization: Secondary | ICD-10-CM | POA: Diagnosis not present

## 2021-12-01 MED ORDER — ZOLEDRONIC ACID 4 MG/100ML IV SOLN
4.0000 mg | Freq: Once | INTRAVENOUS | Status: AC
  Administered 2021-12-01: 4 mg via INTRAVENOUS
  Filled 2021-12-01: qty 100

## 2021-12-01 MED ORDER — PNEUMOCOCCAL VAC POLYVALENT 25 MCG/0.5ML IJ INJ
0.5000 mL | INJECTION | Freq: Once | INTRAMUSCULAR | Status: AC
  Administered 2021-12-01: 0.5 mL via INTRAMUSCULAR
  Filled 2021-12-01: qty 0.5

## 2021-12-01 MED ORDER — HEPATITIS B VAC RECOMBINANT 20 MCG/ML IJ SUSP
2.0000 mL | Freq: Once | INTRAMUSCULAR | Status: AC
  Administered 2021-12-01: 40 ug via INTRAMUSCULAR
  Filled 2021-12-01: qty 2

## 2021-12-01 MED ORDER — SODIUM CHLORIDE 0.9 % IV SOLN: INTRAVENOUS | Status: DC

## 2021-12-01 NOTE — Patient Instructions (Signed)
[facility]  Discharge Instructions: Thank you for choosing [facility] to provide your oncology and hematology care.  If you have a lab appointment with the [facility], please come in thru the Main Entrance and check in at the main information desk.  Wear comfortable clothing and clothing appropriate for easy access to any Portacath or PICC line.   We strive to give you quality time with your provider. You may need to reschedule your appointment if you arrive late (15 or more minutes).  Arriving late affects you and other patients whose appointments are after yours.  Also, if you miss three or more appointments without notifying the office, you may be dismissed from the clinic at the provider's discretion.      For prescription refill requests, have your pharmacy contact our office and allow 72 hours for refills to be completed.    Today you received the following Zometa, Hepatitis vaccine and Pneumococcal vaccine. Return as scheduled.   To help prevent nausea and vomiting after your treatment, we encourage you to take your nausea medication as directed.  BELOW ARE SYMPTOMS THAT SHOULD BE REPORTED IMMEDIATELY: *FEVER GREATER THAN 100.4 F (38 C) OR HIGHER *CHILLS OR SWEATING *NAUSEA AND VOMITING THAT IS NOT CONTROLLED WITH YOUR NAUSEA MEDICATION *UNUSUAL SHORTNESS OF BREATH *UNUSUAL BRUISING OR BLEEDING *URINARY PROBLEMS (pain or burning when urinating, or frequent urination) *BOWEL PROBLEMS (unusual diarrhea, constipation, pain near the anus) TENDERNESS IN MOUTH AND THROAT WITH OR WITHOUT PRESENCE OF ULCERS (sore throat, sores in mouth, or a toothache) UNUSUAL RASH, SWELLING OR PAIN  UNUSUAL VAGINAL DISCHARGE OR ITCHING   Items with * indicate a potential emergency and should be followed up as soon as possible or go to the Emergency Department if any problems should occur.  Please show the CHEMOTHERAPY ALERT CARD or IMMUNOTHERAPY ALERT CARD at check-in to  the Emergency Department and triage nurse.  Should you have questions after your visit or need to cancel or reschedule your appointment, please contact [facility] [phone]  and follow the prompts.  Office hours are 8:00 a.m. to 4:30 p.m. Monday - Friday. Please note that voicemails left after 4:00 p.m. may not be returned until the following business day.  We are closed weekends and major holidays. You have access to a nurse at all times for urgent questions. Please call the main number to the clinic [phone] and follow the prompts.  For any non-urgent questions, you may also contact your provider using MyChart. We now offer e-Visits for anyone 31 and older to request care online for non-urgent symptoms. For details visit [email or web address].   Also download the MyChart app! Go to the app store, search "MyChart", open the app, select [facility], and log in with your MyChart username and password.  Masks are optional in the cancer centers. If you would like for your care team to wear a mask while they are taking care of you, please let them know. For doctor visits, patients may have with them one support person who is at least 31 years old. At this time, visitors are not allowed in the infusion area.

## 2021-12-01 NOTE — Progress Notes (Signed)
[facility], [name]   CLINIC:  Medical Oncology/Hematology  PCP:  Renee Rival, FNP [address] [place] / [place] [address]  [phone]  REASON FOR VISIT:  Follow-up for IgA kappa multiple myeloma  PRIOR THERAPY: none  CURRENT THERAPY: RVD and Darzalex Faspro on D1, 4, 8, 11, and 15; Revlimid 2 weeks on, 1 week off; Zometa every 4 weeks  INTERVAL HISTORY:  [name], a 31 y.o. female, returns for routine follow-up for her IgA kappa multiple myeloma. Diamond Robbins was last seen on 09/08/2021.  Today she reports feeling good. She denies numbness/tingling, diarrhea, and constipation. She continues to take Revlimid, and she reports tolerating it well.   REVIEW OF SYSTEMS:  Review of Systems  Constitutional:  Negative for appetite change and fatigue.  Gastrointestinal:  Negative for constipation and diarrhea.  Neurological:  Negative for numbness.  All other systems reviewed and are negative.   PAST MEDICAL/SURGICAL HISTORY:  Past Medical History:  Diagnosis Date   Cancer ([place]) 08/01/2020   multiple myeloma   DKA (diabetic ketoacidosis) ([place]) 02/23/2020   Hypertension    Type 1 diabetes mellitus ([place]) 02/23/2020   Past Surgical History:  Procedure Laterality Date   BONE MARROW TRANSPLANT     NO PAST SURGERIES      SOCIAL HISTORY:  Social History   Socioeconomic History   Marital status: Married    Spouse name: Not on file   Number of children: Not on file   Years of education: Not on file   Highest education level: Not on file  Occupational History   Occupation: unemployed    Comment: can go back to work Jan 2023  Tobacco Use   Smoking status: Never   Smokeless tobacco: Never  Vaping Use   Vaping Use: Never used  Substance and Sexual Activity   Alcohol use: Not Currently    Comment: occ- 0-1 drinks per week; maybe 1 drink every 6 months   Drug use: No   Sexual activity: Yes     Birth control/protection: Condom  Other Topics Concern   Not on file  Social History Narrative   Has 2 step-daughters   Social Determinants of Health   Financial Resource Strain: Low Risk  (03/09/2020)   Overall Financial Resource Strain (CARDIA)    Difficulty of Paying Living Expenses: Not hard at all  Food Insecurity: No Food Insecurity (04/07/2021)   Hunger Vital Sign    Worried About Running Out of Food in the Last Year: Never true    [place] in the Last Year: Never true  Transportation Needs: No Transportation Needs (06/08/2021)   PRAPARE - Hydrologist (Medical): No    Lack of Transportation (Non-Medical): No  Physical Activity: Sufficiently Active (06/08/2021)   Exercise Vital Sign    Days of Exercise per Week: 3 days    Minutes of Exercise per Session: 60 min  Stress: No Stress Concern Present (03/09/2020)   [place]    Feeling of Stress : Not at all  Social Connections: [place] (03/08/2021)   Social Connection and Isolation Panel [NHANES]    Frequency of Communication with Friends and Family: More than three times a week    Frequency of Social Gatherings with Friends and Family: More than three times a week    Attends Religious Services: More than 4 times per year  Active Member of Clubs or Organizations: Yes    Attends Archivist Meetings: Never    Marital Status: Married  Human resources officer Violence: Not At Risk (03/09/2020)   Humiliation, Afraid, Rape, and Kick questionnaire    Fear of Current or Ex-Partner: No    Emotionally Abused: No    Physically Abused: No    Sexually Abused: No    FAMILY HISTORY:  Family History  Problem Relation Age of Onset   Hypertension Mother    Diabetes Mother    Hypertension Father    Diabetes Maternal Grandmother     CURRENT MEDICATIONS:  Current Outpatient Medications  Medication Sig Dispense  Refill   ACCU-CHEK GUIDE test strip Use to check blood glucose 4 times daily. 125 each 1   Accu-Chek Softclix Lancets lancets Use to check blood glucose 4 times daily. 200 each 0   acyclovir (ZOVIRAX) 800 MG tablet Take 800 mg by mouth daily.     amLODipine (NORVASC) 5 MG tablet Take 1 tablet (5 mg total) by mouth daily. 90 tablet 1   Aspirin 81 MG CAPS Take 81 mg by mouth daily.     Blood Glucose Monitoring Suppl (ACCU-CHEK GUIDE) w/Device KIT      Blood Pressure Monitor KIT Use to check blood pressure as instructed 1 kit 0   calcium carbonate (OS-CAL) 1250 (500 Ca) MG chewable tablet Chew 1 tablet by mouth daily.     cetirizine (ZYRTEC) 10 MG tablet Take 1 tablet by mouth once daily 30 tablet 0   cholecalciferol (VITAMIN D3) 25 MCG (1000 UNIT) tablet Take 1,000 Units by mouth daily.     Continuous Blood Gluc Sensor (DEXCOM G6 SENSOR) MISC Change sensor every 10 days as directed 9 each 3   Continuous Blood Gluc Transmit (DEXCOM G6 TRANSMITTER) MISC Change transmitter every 90 days as directed. 1 each 3   fluticasone (FLONASE) 50 MCG/ACT nasal spray Place 2 sprays into both nostrils daily. 16 g 6   folic acid (FOLVITE) 1 MG tablet Take 1 tablet by mouth once daily 30 tablet 0   insulin aspart (NOVOLOG FLEXPEN) 100 UNIT/ML FlexPen Inject 5-11 Units into the skin 3 (three) times daily with meals. 15 mL 3   insulin glargine (LANTUS) 100 UNIT/ML Solostar Pen Inject 35 Units into the skin at bedtime. 27 mL 2   lenalidomide (REVLIMID) 10 MG capsule Take 1 capsule daily for 21 days, then take 7 days off 21 capsule 0   losartan (COZAAR) 25 MG tablet Take 1 tablet (25 mg total) by mouth daily. 90 tablet 1   metoprolol tartrate (LOPRESSOR) 25 MG tablet Take 1/2 (one-half) tablet by mouth twice daily 30 tablet 0   Multiple Vitamin (MULTIVITAMIN) tablet Take 1 tablet by mouth daily.     rosuvastatin (CRESTOR) 20 MG tablet Take 1 tablet by mouth daily.     UNABLE TO FIND Blood pressure cuff DX I10 1 each 0    No current facility-administered medications for this visit.    ALLERGIES:  No Known Allergies  PHYSICAL EXAM:  Performance status (ECOG): 1 - Symptomatic but completely ambulatory  There were no vitals filed for this visit. Wt Readings from Last 3 Encounters:  09/14/21 180 lb (81.6 kg)  09/14/21 180 lb (81.6 kg)  09/09/21 178 lb (80.7 kg)   Physical Exam Vitals reviewed.  Constitutional:      Appearance: Normal appearance. She is obese.  Cardiovascular:     Rate and Rhythm: Normal rate and regular rhythm.  Pulses: Normal pulses.     Heart sounds: Normal heart sounds.  Pulmonary:     Effort: Pulmonary effort is normal.     Breath sounds: Normal breath sounds.  Musculoskeletal:     Right lower leg: No edema.     Left lower leg: No edema.  Neurological:     General: No focal deficit present.     Mental Status: She is alert and oriented to person, place, and time.  Psychiatric:        Mood and Affect: Mood normal.        Behavior: Behavior normal.     LABORATORY DATA:  I have reviewed the labs as listed.     Latest Ref Rng & Units 11/24/2021    9:33 AM 09/08/2021   10:19 AM 08/30/2021    2:11 PM  CBC  WBC 4.0 - 10.5 K/uL 3.4  4.7  4.3   Hemoglobin 12.0 - 15.0 g/dL 13.6  14.4  13.5   Hematocrit 36.0 - 46.0 % 42.3  45.5  43.3   Platelets 150 - 400 K/uL 253  297  259       Latest Ref Rng & Units 11/24/2021    9:33 AM 09/08/2021   10:19 AM 08/30/2021    2:11 PM  CMP  Glucose 70 - 99 mg/dL 177  191  192   BUN 6 - 20 mg/dL _0 Creatinine 0.44 - 1.00 mg/dL 0.63  0.61  0.68   Sodium 135 - 145 mmol/L 138  136  139   Potassium 3.5 - 5.1 mmol/L 3.7  3.8  3.7   Chloride 98 - 111 mmol/L 106  101  105   CO2 22 - 32 mmol/L _1 Calcium 8.9 - 10.3 mg/dL 9.4  9.7  9.6   Total Protein 6.5 - 8.1 g/dL 8.0  8.8  8.6   Total Bilirubin 0.3 - 1.2 mg/dL 0.5  0.5  0.3   Alkaline Phos 38 - 126 U/L 72  86  83   AST 15 - 41 U/L _2 ALT 0 - 44 U/L 48  42   37       Component Value Date/Time   RBC 5.05 11/24/2021 0933   MCV 83.8 11/24/2021 0933   MCV 82 02/18/2021 0934   MCH 26.9 11/24/2021 0933   MCHC 32.2 11/24/2021 0933   RDW 14.0 11/24/2021 0933   RDW 13.2 02/18/2021 0934   LYMPHSABS 1.4 11/24/2021 0933   LYMPHSABS 1.1 02/18/2021 0934   MONOABS 0.3 11/24/2021 0933   EOSABS 0.2 11/24/2021 0933   EOSABS 0.5 (H) 02/18/2021 0934   BASOSABS 0.1 11/24/2021 0933   BASOSABS 0.0 02/18/2021 0934    DIAGNOSTIC IMAGING:  I have independently reviewed the scans and discussed with the patient. No results found.   ASSESSMENT:  1.  Stage III IgA kappa multiple myeloma: -Presentation to [facility] in [place] with confusion and weakness. -Found to have diabetic ketoacidosis with newly diagnosed type 1 diabetes. -Also found to have acute renal failure and hypercalcemia. -Bone marrow biopsy on 02/25/2020 with flow cytometry with the population CD138 positive, CD56 positive IgA kappa monoclonal plasma cells 12%.  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 -Ultrasound abdomen on 02/23/2020 showed increased hepatic echogenicity with normal spleen. -Renal ultrasound on 02/24/2020 was normal. -CT chest on  02/22/2020 with moth eaten appearance of the bones. -Beta-2 microglobulin 2.1.  LDH 322.  SPEP and immunofixation were normal.  Kappa light chains are elevated at 36.8.  Lambda light chains 12.9 and ratio of 2.85. -24-hour urine immunofixation was negative.  M spike was negative. -PET scan on 03/23/2020 showed no FDG radiotracer activity, no soft tissue plasmacytoma.  Diffuse multiple small lytic lesions within the pelvis, spine and vertebral bodies consistent with myeloma. -Chromosome analysis was 27, XX.  Multiple myeloma FISH panel was normal. -She is considered high risk based on elevated LDH level. 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 - BMBX on 12/15/2020 with variably normocellular 30 to 70% with trilineage hematopoiesis.  No increase in plasma cells.  Myeloma FISH panel was normal.  Chromosome analysis was normal. - Maintenance Revlimid 10 mg 3 weeks on/1 week off started around 12/08/2020.   2.  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  Non-smoker. -No family history of myeloma or other malignancies.   PLAN:  1.  Stage III IgA kappa multiple myeloma: - MRD results from bone marrow were negative at [facility] from August 2022. - Myeloma panel from 11/24/2021: M spike not observed.  CBC grossly normal with mild leukopenia.  Immunofixation was negative.  Free light chains are pending.  Creatinine and calcium are normal. - Recommend continuing Revlimid 10 mg 3 weeks on/1 week off maintenance.  She does not report any significant side effects. - RTC 12 weeks with repeat labs including myeloma panel.   2.  Type I diabetes: - Continue Lantus 30 units at bedtime and Levemir sliding scale.   3.  Hypertension: - Continue Norvasc, metoprolol and amlodipine.   4.  Bone protection:  - Calcium is 9.4.  Continue calcium supplements.  Continue Zometa today and every 12 weeks.  Orders placed this encounter:  No orders of the defined types were placed in this encounter.    [name], MD [place] [phone]   I, Thana Ates, am acting as a scribe for Dr. [name].  I, [name] MD, have reviewed the above documentation for accuracy and completeness, and I agree with the above.

## 2021-12-01 NOTE — Progress Notes (Signed)
Patient okay for Zometa today per Dr. Delton Coombes. Patient due for Hepatitis B injections and Pneumococcal vaccines today. Patient tolerated injections  with no complaints voiced. Site clean and dry with no bruising or swelling noted at site. See MAR for details. Band aid applied.  Patient stable during and after injection. VSS with discharge and left in satisfactory condition with no s/s of distress noted.  Patient tolerated therapy with no complaints voiced. Labs reviewed. Side effects with management reviewed with understanding verbalized. Peripheral IV site clean and dry with no bruising or swelling noted at site. Good blood return noted before and after administration of therapy. Band aid applied. Patient left in satisfactory condition with VSS and no s/s of distress noted.

## 2021-12-01 NOTE — Progress Notes (Signed)
Patient is taking Revlimid as prescribed.  She has not missed any doses and reports no side effects at this time.   

## 2021-12-01 NOTE — Patient Instructions (Signed)
Diamond Robbins at [facility] Discharge Instructions   You were seen and examined today by Dr. Delton Coombes.  He reviewed the results of your lab work which are normal/stable.  We will proceed with Zometa infusion today.  Return as scheduled in 3 months with lab work 1 week prior.    Thank you for choosing [place] [facility] to provide your oncology and hematology care.  To afford each patient quality time with our provider, please arrive at least 15 minutes before your scheduled appointment time.   If you have a lab appointment with the [facility] please come in thru the Main Entrance and check in at the main information desk.  You need to re-schedule your appointment should you arrive 10 or more minutes late.  We strive to give you quality time with our providers, and arriving late affects you and other patients whose appointments are after yours.  Also, if you no show three or more times for appointments you may be dismissed from the clinic at the providers discretion.     Again, thank you for choosing [facility].  Our hope is that these requests will decrease the amount of time that you wait before being seen by our physicians.       _____________________________________________________________  Should you have questions after your visit to [facility], please contact our office at [phone] and follow the prompts.  Our office hours are 8:00 a.m. and 4:30 p.m. Monday - Friday.  Please note that voicemails left after 4:00 p.m. may not be returned until the following business day.  We are closed weekends and major holidays.  You do have access to a nurse 24-7, just call the main number to the clinic [phone] and do not press any options, hold on the line and a nurse will answer the phone.    For prescription refill requests, have your pharmacy contact our office and allow 72 hours.    Due to Covid, you  will need to wear a mask upon entering the hospital. If you do not have a mask, a mask will be given to you at the Main Entrance upon arrival. For doctor visits, patients may have 1 support person age 24 or older with them. For treatment visits, patients can not have anyone with them due to social distancing guidelines and our immunocompromised population.

## 2021-12-05 IMAGING — CT NM PET TUM IMG INITIAL (PI) WHOLE BODY
4 series · 25 of 25 positions shown · non-contrast
Comparison: None.

CLINICAL DATA: Initial treatment strategy for multiple myeloma.

EXAM:
NUCLEAR MEDICINE PET WHOLE BODY
TECHNIQUE: 8.3 mCi F-18 FDG was injected intravenously. Full-ring PET imaging
was performed from the head to foot after the radiotracer. CT data
was obtained and used for attenuation correction and anatomic
localization.
Fasting blood glucose: 178 mg/dl

[Series 3: ct wb fusion · axial · 5.0mm · 0.98mm/px · z∈[-1884,-294]mm · 8 of 637 slices shown]
[im 1/637  soft-tissue]
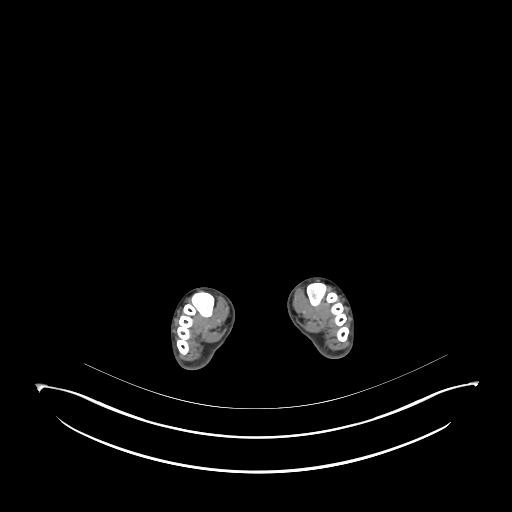
[im 91/637  soft-tissue]
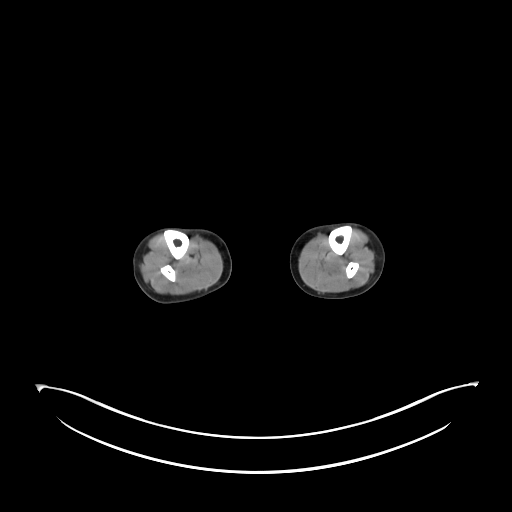
[im 182/637  soft-tissue]
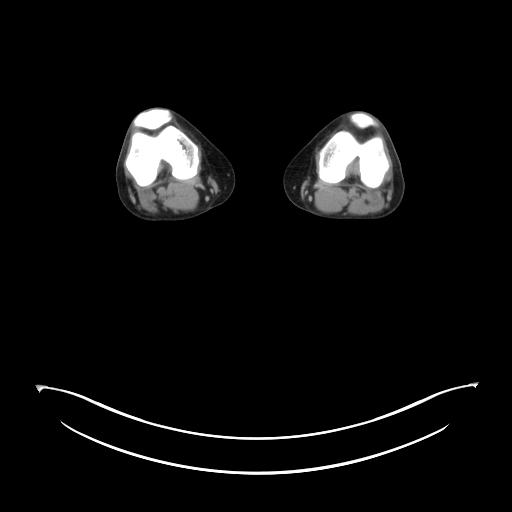
[im 273/637  soft-tissue]
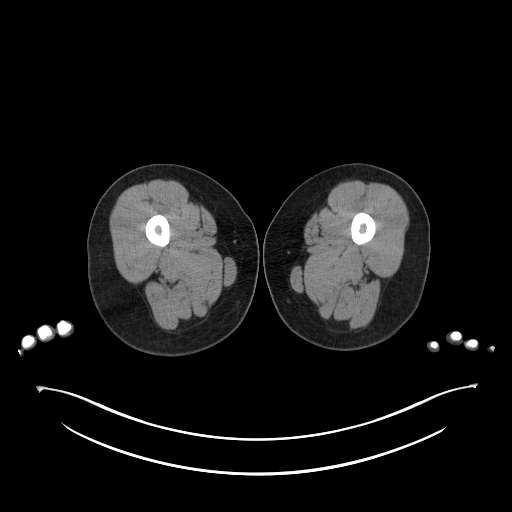
[im 364/637  soft-tissue]
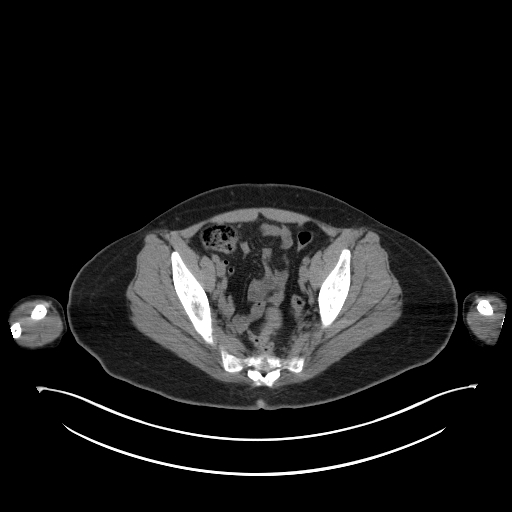
[im 455/637  soft-tissue]
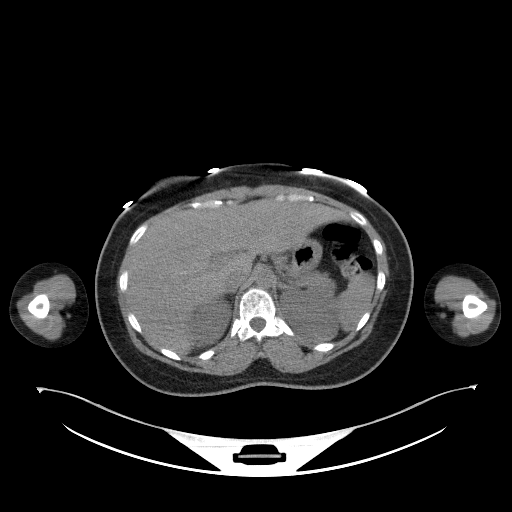
[im 546/637  soft-tissue]
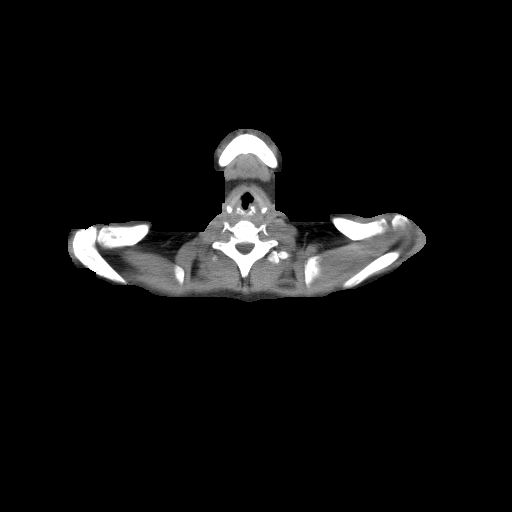
[im 637/637]
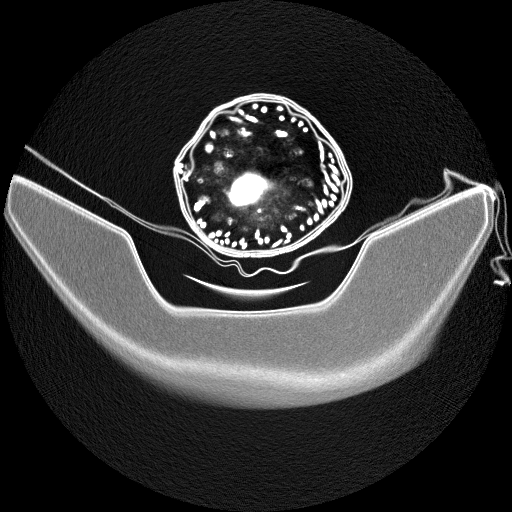

[Series 5: pet wb uncorrected · axial · 5.0mm · 4.11mm/px · z∈[-1884,-294]mm · 8 of 637 slices shown]
[im 1/637]
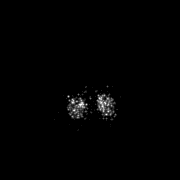
[im 91/637]
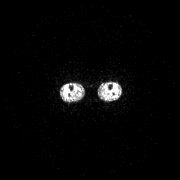
[im 182/637]
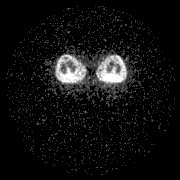
[im 273/637  full-range]
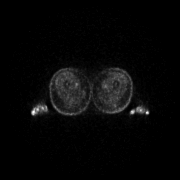
[im 364/637]
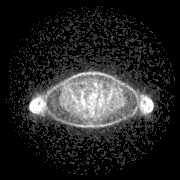
[im 455/637]
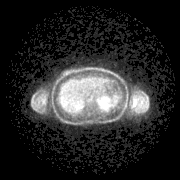
[im 546/637]
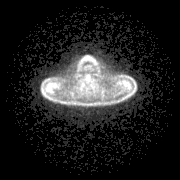
[im 637/637]
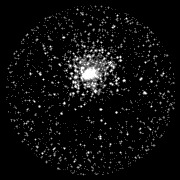

[pet wb · axial · 2.5mm · 4.11mm/px · z∈[-1884,-294]mm · 8 of 637 slices shown]
[im 1/637]
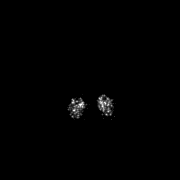
[im 91/637]
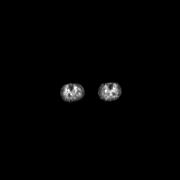
[im 182/637]
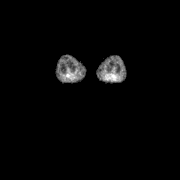
[im 273/637]
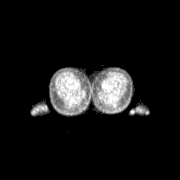
[im 364/637]
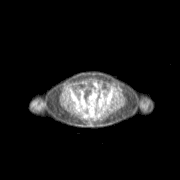
[im 455/637]
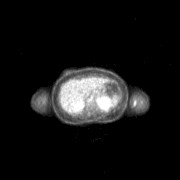
[im 546/637]
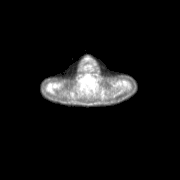
[im 637/637]
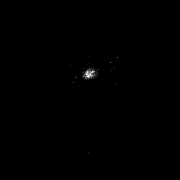

[results mm oncology reading · 1.0mm · 1.02mm/px · 1 of 2 slices shown]
[im 1/2]
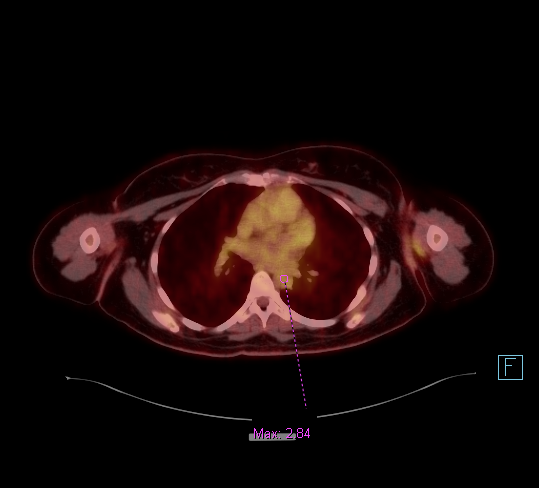

[25 of 25 positions shown; findings below may reference images not displayed]

FINDINGS: Mediastinal blood pool activity: SUV max

HEAD/NECK: No hypermetabolic activity in the scalp. No
hypermetabolic cervical lymph nodes.

Incidental CT findings: None

CHEST: No hypermetabolic mediastinal or hilar nodes. No suspicious
pulmonary nodules on the CT scan.

Incidental CT findings: none

ABDOMEN/PELVIS: No abnormal hypermetabolic activity within the
liver, pancreas, adrenal glands, or spleen. No hypermetabolic lymph
nodes in the abdomen or pelvis.

Incidental CT findings: Normal volume spleen. Soft tissue
plasmacytoma.

SKELETON: No focal hypermetabolic activity to suggest skeletal
metastasis.

Incidental CT findings: Extensive diffuse lucencies through the
iliac bones and sacrum. Findings extend into the lumbar vertebral
bodies with there multiple small lucent lesions. Similar findings in
the thoracic vertebral bodies.

Small lucent lesions within the scapula.

EXTREMITIES: No abnormal hypermetabolic activity in the lower
extremities.

Incidental CT findings: No significant lucent lesions identified.
IMPRESSION: 1. No focal FDG radiotracer activity to suggest hypermetabolic
active multiple myeloma.
2. No soft tissue plasmacytoma.
3. Diffuse multiple small lytic lesions within the pelvis, spine and
vertebral bodies is a pattern typical of multiple myeloma.

## 2021-12-14 DIAGNOSIS — Z419 Encounter for procedure for purposes other than remedying health state, unspecified: Secondary | ICD-10-CM | POA: Diagnosis not present

## 2021-12-15 ENCOUNTER — Ambulatory Visit: Payer: Medicaid Other | Admitting: Nurse Practitioner

## 2021-12-15 ENCOUNTER — Encounter: Payer: Self-pay | Admitting: Nutrition

## 2021-12-15 ENCOUNTER — Encounter: Payer: Medicaid Other | Attending: Nurse Practitioner | Admitting: Nutrition

## 2021-12-15 VITALS — Ht 62.0 in | Wt 185.0 lb

## 2021-12-15 DIAGNOSIS — Z7689 Persons encountering health services in other specified circumstances: Secondary | ICD-10-CM | POA: Diagnosis not present

## 2021-12-15 DIAGNOSIS — E669 Obesity, unspecified: Secondary | ICD-10-CM | POA: Insufficient documentation

## 2021-12-15 DIAGNOSIS — E1010 Type 1 diabetes mellitus with ketoacidosis without coma: Secondary | ICD-10-CM | POA: Diagnosis not present

## 2021-12-15 DIAGNOSIS — E785 Hyperlipidemia, unspecified: Secondary | ICD-10-CM | POA: Diagnosis not present

## 2021-12-15 DIAGNOSIS — C9000 Multiple myeloma not having achieved remission: Secondary | ICD-10-CM | POA: Insufficient documentation

## 2021-12-15 DIAGNOSIS — I10 Essential (primary) hypertension: Secondary | ICD-10-CM | POA: Diagnosis not present

## 2021-12-15 DIAGNOSIS — E1069 Type 1 diabetes mellitus with other specified complication: Secondary | ICD-10-CM | POA: Diagnosis not present

## 2021-12-15 NOTE — Progress Notes (Signed)
Medical Nutrition Therapy  Follow up Appointment Start time:  0903 Appointment End time:  0933 Primary concerns today: Diabetes Type 1  Referral diagnosis: E10.8 Preferred learning style: Viscual and reading  Learning readiness: Change in progress )   NUTRITION ASSESSMENT  Type 1 follow up Has had problems with her dexcom staying on. She has been testing with her meter instead. Has to put on new sensor today.  Will r/s appt with Whitney so she can bring her meter to assess BS readings for accurate medication adjustments.  Changes made:    Been eating more watermelon. Has been working better with proper portions. Still struggles witih limiting breads. Has been eating more lower carb vegetables. Drinking water well. Physical activity: joined gym and been going 2-3 times per week. Feels  much better.  Per memory, FBS: 140-150's  Before lunch 150-200's Before dinner: 150-200's and bedtime: 180-200's. Forgot her meter but has been testing since her dexcom hasn't been working.  Taking Lantus to 35 units and 5 units of Novolog with meals. Using slidinig scale.  Has Multiple Myeloma and gets monthly injections. In remission.  Anthropometrics  Wt Readings from Last 3 Encounters:  12/01/21 186 lb 1.6 oz (84.4 kg)  09/14/21 180 lb (81.6 kg)  09/14/21 180 lb (81.6 kg)   Ht Readings from Last 3 Encounters:  12/01/21 5' 3"  (1.6 m)  09/14/21 5' 3"  (1.6 m)  09/14/21 5' 3"  (1.6 m)   There is no height or weight on file to calculate BMI. @BMIFA @ Facility age limit for growth %iles is 20 years. Facility age limit for growth %iles is 20 years.    Clinical Medical Hx: Type 1 DM Dx in 2021. Mulitiple Myeloma Medications:  Labs:  Lab Results  Component Value Date   HGBA1C 10.0 (H) 06/21/2021      Latest Ref Rng & Units 11/24/2021    9:33 AM 09/08/2021   10:19 AM 08/30/2021    2:11 PM  CMP  Glucose 70 - 99 mg/dL 177  191  192   BUN 6 - 20 mg/dL 11  11  13    Creatinine 0.44 - 1.00 mg/dL  0.63  0.61  0.68   Sodium 135 - 145 mmol/L 138  136  139   Potassium 3.5 - 5.1 mmol/L 3.7  3.8  3.7   Chloride 98 - 111 mmol/L 106  101  105   CO2 22 - 32 mmol/L 24  25  25    Calcium 8.9 - 10.3 mg/dL 9.4  9.7  9.6   Total Protein 6.5 - 8.1 g/dL 8.0  8.8  8.6   Total Bilirubin 0.3 - 1.2 mg/dL 0.5  0.5  0.3   Alkaline Phos 38 - 126 U/L 72  86  83   AST 15 - 41 U/L 30  28  26    ALT 0 - 44 U/L 48  42  37     Notable Signs/Symptoms: NOne  Lifestyle & Dietary Hx Lives with her husband. 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   24-Hr Dietary Recall First Meal: Eggs and toast Snack:  Second Meal: Chicken, bread or biscuit, greens, watermelon, water Snack:  Third Meal: chicken, veggies, water, bread Beverages: water  Estimated Energy Needs Calories: 1200 Carbohydrate: 135g Protein: 90g Fat: 33g   NUTRITION DIAGNOSIS  NB-1.1  Food and nutrition-related knowledge deficit As related to Diabetes Type 2.  As evidenced by A1C 10%..   NUTRITION INTERVENTION  Nutrition education (E-1) on the following topics:  Nutrition and Diabetes education provided on My Plate, CHO counting, meal planning, portion sizes, timing of meals, avoiding snacks between meals unless having a low blood sugar, target ranges for A1C and blood sugars, signs/symptoms and treatment of hyper/hypoglycemia, monitoring blood sugars, taking medications as prescribed, benefits of exercising 30 minutes per day and prevention of complications of DM. Lifestyle Medicine - Whole Food, Plant Predominant Nutrition is highly recommended: Eat Plenty of vegetables, Mushrooms, fruits, Legumes, Whole Grains, Nuts, seeds in lieu of processed meats, processed snacks/pastries red meat, poultry, eggs.    -It is better to avoid simple carbohydrates  including: Cakes, Sweet Desserts, Ice Cream, Soda (diet and regular), Sweet Tea, Candies, Chips, Cookies, Store Bought Juices, Alcohol in Excess of  1-2 drinks a day, Lemonade,  Artificial Sweeteners, Doughnuts, Coffee Creamers, "Sugar-free" Products, etc, etc.  This is not a complete list.....  Exercise: If you are able: 30 -60 minutes a day ,4 days a week, or 150 minutes a week.  The longer the better.  Combine stretch, strength, and aerobic activities.  If you were told in the past that you have high risk for cardiovascular diseases, you may seek evaluation by your heart doctor prior to initiating moderate to intense exercise programs.    Handouts Provided Include  Grocery list for plant based foods.  Learning Style & Readiness for Change Teaching method utilized: Visual & Auditory  Demonstrated degree of understanding via: Teach Back  Barriers to learning/adherence to lifestyle change: non3  Goals  Established by Pt  Goals  Continue to work on portion control with starches and fruit Change to whole wheat bread instead of white bread Work out at [employer] 3 times a week-30 min to an hour. Get A1C down to 7% or below Lose 1 lbs per week. Call Dexcom to replace sensor.  Lifestyle Medicine - Whole Food, Plant Predominant Nutrition is highly recommended: Eat Plenty of vegetables, Mushrooms, fruits, Legumes, Whole Grains, Nuts, seeds in lieu of processed meats, processed snacks/pastries red meat, poultry, eggs.    -It is better to avoid simple carbohydrates including: Cakes, Sweet Desserts, Ice Cream, Soda (diet and regular), Sweet Tea, Candies, Chips, Cookies, Store Bought Juices, Alcohol in Excess of  1-2 drinks a day, Lemonade,  Artificial Sweeteners, Doughnuts, Coffee Creamers, "Sugar-free" Products, etc, etc.  This is not a complete list.....  Exercise: If you are able: 30 -60 minutes a day ,4 days a week, or 150 minutes a week.  The longer the better.  Combine stretch, strength, and  aerobic activities.  If you were told in the past that you have high risk for cardiovascular diseases, you may seek evaluation by your heart doctor prior to initiating moderate to intense exercise programs.   MONITORING & EVALUATION Dietary intake, weekly physical activity, and blood sugars in 3 month.  Next Steps  Patient is to work on meal planning.

## 2021-12-15 NOTE — Patient Instructions (Signed)
Goals  Continue to work on portion control with starches and fruit Change to whole wheat bread instead of white bread Work out at [employer] 3 times a week-30 min to an hour. Get A1C down to 7% or below Lose 1 lbs per week. Call Dexcom to replace sensor.

## 2021-12-17 ENCOUNTER — Other Ambulatory Visit: Payer: Self-pay

## 2021-12-17 ENCOUNTER — Inpatient Hospital Stay: Payer: Medicaid Other | Attending: Hematology

## 2021-12-17 DIAGNOSIS — C9000 Multiple myeloma not having achieved remission: Secondary | ICD-10-CM | POA: Insufficient documentation

## 2021-12-17 LAB — PREGNANCY, URINE: Preg Test, Ur: NEGATIVE

## 2021-12-17 MED ORDER — LENALIDOMIDE 10 MG PO CAPS: ORAL_CAPSULE | ORAL | 0 refills | Status: DC

## 2021-12-17 NOTE — Telephone Encounter (Signed)
Chart reviewed. Revlimid refilled per last office note with Dr. Katragadda.  

## 2021-12-28 ENCOUNTER — Other Ambulatory Visit: Payer: Self-pay | Admitting: Pharmacist

## 2021-12-28 DIAGNOSIS — I10 Essential (primary) hypertension: Secondary | ICD-10-CM

## 2021-12-28 DIAGNOSIS — E1010 Type 1 diabetes mellitus with ketoacidosis without coma: Secondary | ICD-10-CM

## 2021-12-28 NOTE — Chronic Care Management (AMB) (Signed)
Chief Complaint  Patient presents with   Hypertension   Diabetes    Diamond Robbins is a 31 y.o. year old female who presented for a telephone visit.   They were referred to the pharmacist by their PCP for assistance in managing diabetes and hypertension.   Patient is participating in a Managed Medicaid Plan:  Yes  Subjective:  Care Team: Primary Care Provider: Renee Rival, FNP ; Next Scheduled Visit: 02/03/22 Endocrinologist Reardon; Next Scheduled Visit: 12/31/21 Hematology/Oncology: Delton Coombes; Next Scheduled Visit: 03/02/22  Medication Access/Adherence  Current Pharmacy:  [facility] [address] [place] - [address] [place] [address] Phone: [phone] Fax: [phone]  [place], [place] - [address] [place] [place] [address] Phone: [phone] Fax: [phone]  [facility] [place], [place] [address] [place] [address] Phone: [phone] Fax: [phone]  [place], [name] [address] Phone: [phone] Fax: [phone]   Patient reports affordability concerns with their medications: No  Patient reports access/transportation concerns to their pharmacy: No  Patient reports adherence concerns with their medications:  No     Diabetes:  Current medications: Insulin glargine 35 units daily, Novolog 5-11 units three times daily with meals  Reports significant improvement in glycemic control with change from Levemir to insulin glargine   Patient denies hypoglycemic s/sx including dizziness, shakiness, sweating. Patient denies hyperglycemic symptoms including polyuria, polydipsia, polyphagia, nocturia, neuropathy, blurred vision.  Current meal patterns: recently saw endocrinology   Hypertension:  Current  medications: amlodipine 5 mg daily, losartan 25 mg daily, metoprolol tartrate 12.5 mg twice daily  Patient has a validated, automated, upper arm home BP cuff but has not been checking lately  Hyperlipidemia/ASCVD Risk Reduction  Current lipid lowering medications: rosuvastatin 20 mg daily   Antiplatelet regimen: aspirin 325 mg daily (per hem/onc)   Health Maintenance  Health Maintenance Due  Topic Date Due   COVID-19 Vaccine (1) Never done   TETANUS/TDAP  Never done   INFLUENZA VACCINE  12/14/2021   HEMOGLOBIN A1C  12/19/2021     Objective: Lab Results  Component Value Date   HGBA1C 10.0 (H) 06/21/2021    Lab Results  Component Value Date   CREATININE 0.63 11/24/2021   BUN 11 11/24/2021   NA 138 11/24/2021   K 3.7 11/24/2021   CL 106 11/24/2021   CO2 24 11/24/2021    Lab Results  Component Value Date   CHOL 158 06/21/2021   HDL 62 06/21/2021   LDLCALC 74 06/21/2021   TRIG 125 06/21/2021   CHOLHDL 2.5 06/21/2021    Medications Reviewed Today     Reviewed by Osker Mason, RPH-CPP (Pharmacist) on 12/28/21 at [phone]  Med List Status: <None>   Medication Order Taking? Sig Documenting Provider Last Dose Status Informant  ACCU-CHEK GUIDE test strip [id number] No Use to check blood glucose 4 times daily.  Patient not taking: Reported on 12/28/2021   Brita Romp, NP Not Taking Active   Accu-Chek Softclix Lancets lancets [id number] No Use to check blood glucose 4 times daily.  Patient not taking: Reported on 12/28/2021   Brita Romp, NP Not Taking Active   acyclovir (ZOVIRAX) 800 MG tablet [id number]  Take 800 mg by mouth daily. [provider]  Active   amLODipine (NORVASC) 5 MG tablet [id number] Yes Take 1 tablet (5 mg total) by mouth daily. Renee Rival, FNP Taking Active   aspirin 325 MG tablet [id number] Yes Take 1 tablet by mouth daily. [provider] Taking Active   Blood Glucose Monitoring Suppl (ACCU-CHEK GUIDE) w/Device  KIT [id number]   [provider]  Active Self  Blood Pressure Monitor KIT [id number]  Use to check blood pressure as instructed Fayrene Helper, MD  Active   calcium carbonate (OS-CAL) 1250 (500 Ca) MG chewable tablet [id number] No Chew 1 tablet by mouth daily.  Patient not taking: Reported on 12/28/2021   [provider] Not Taking Active   cetirizine (ZYRTEC) 10 MG tablet [id number] No Take 1 tablet by mouth once daily  Patient not taking: Reported on 12/28/2021   Renee Rival, FNP Not Taking Active   cholecalciferol (VITAMIN D3) 25 MCG (1000 UNIT) tablet [id number] Yes Take 1,000 Units by mouth daily. [provider] Taking Active   Continuous Blood Gluc Sensor (DEXCOM G6 SENSOR) MISC [id number]  Change sensor every 10 days as directed Brita Romp, NP  Active   Continuous Blood Gluc Transmit (DEXCOM G6 TRANSMITTER) MISC [id number]  Change transmitter every 90 days as directed. Brita Romp, NP  Active   fluticasone [facility]) 50 MCG/ACT nasal spray [id number] Yes Place 2 sprays into both nostrils daily. Renee Rival, FNP Taking Active            Med Note Wynetta Emery, JOLIZA   Thu Sep 09, 2021 10:52 AM) As needed  folic acid (FOLVITE) 1 MG tablet [id number] No Take 1 tablet by mouth once daily  Patient not taking: Reported on 12/28/2021   Renee Rival, FNP Not Taking Active   insulin aspart (NOVOLOG FLEXPEN) 100 UNIT/ML FlexPen [id number] Yes Inject 5-11 Units into the skin 3 (three) times daily with meals. Brita Romp, NP Taking Active   Insulin Glargine Solostar (LANTUS) 100 UNIT/ML Solostar Pen [id number]  Inject 35 Units into the skin daily. [provider]  Active   lenalidomide (REVLIMID) 10 MG capsule [id number] Yes Take 1 capsule daily for 21 days, then take 7 days off [name], MD Taking Active   losartan (COZAAR) 25 MG tablet [id number] Yes Take 1 tablet (25 mg total) by mouth daily. Renee Rival, FNP  Taking Active   metoprolol tartrate (LOPRESSOR) 25 MG tablet [id number] Yes Take 1/2 (one-half) tablet by mouth twice daily Paseda, Dewaine Conger, FNP Taking Active   Multiple Vitamin (MULTIVITAMIN) tablet [id number] Yes Take 1 tablet by mouth daily. [provider] Taking Active   rosuvastatin (CRESTOR) 20 MG tablet [id number] Yes Take 1 tablet by mouth daily. [provider] Taking Active   UNABLE TO FIND [id number]  Blood pressure cuff DX I10 Paseda, Dewaine Conger, FNP  Active   Med List Note [name], CPhT 03/07/13 1442): No preferred pharmacy.               Assessment/Plan:   Diabetes: - Currently uncontrolled but improving.  - Will collaborate with patient to connect to our DexCom [employer] portal.  - Recommend to continue current regimen. Follow up with endocrinology on Friday as scheduled  Hypertension: - Currently uncontrolled per last office reading, recommend goal <130/80 - Reviewed long term cardiovascular and renal outcomes of uncontrolled blood pressure - Reviewed appropriate blood pressure monitoring technique and reviewed goal blood pressure. Recommended to check home blood pressure and heart rate daily, especially  between now and endocrinology visit.  - If BP >130/80, recommend dose increase of losartan  Hyperlipidemia/ASCVD Risk Reduction: - Currently controlled.  - Recommend to continue current regimen   Follow Up Plan: phone call in 6 weeks  Catie TJodi Mourning, PharmD, [facility] [phone]

## 2021-12-28 NOTE — Patient Instructions (Signed)
Diamond Robbins,   It was great talking to you!!!  Keep up the great work. Please check your blood pressure between now and when you see [name] later this week. Our goal is less than 130/80.   To appropriately check your blood pressure, make sure you do the following:  1) Avoid caffeine, exercise, or tobacco products for 30 minutes before checking. Empty your bladder. 2) Sit with your back supported in a flat-backed chair. Rest your arm on something flat (arm of the chair, table, etc). 3) Sit still with your feet flat on the floor, resting, for at least 5 minutes.  4) Check your blood pressure. Take 1-2 readings.  5) Write down these readings and bring with you to any provider appointments.  Bring your home blood pressure machine with you to a provider's office for accuracy comparison at least once a year.   Make sure you take your blood pressure medications before you come to any office visit, even if you were asked to fast for labs.  Take care!  [name], PharmD, [facility] [phone]

## 2021-12-31 ENCOUNTER — Ambulatory Visit: Payer: Medicaid Other | Admitting: Nurse Practitioner

## 2021-12-31 DIAGNOSIS — E782 Mixed hyperlipidemia: Secondary | ICD-10-CM

## 2021-12-31 DIAGNOSIS — I10 Essential (primary) hypertension: Secondary | ICD-10-CM

## 2021-12-31 DIAGNOSIS — E1065 Type 1 diabetes mellitus with hyperglycemia: Secondary | ICD-10-CM

## 2022-01-03 ENCOUNTER — Other Ambulatory Visit: Payer: Self-pay | Admitting: Nurse Practitioner

## 2022-01-11 NOTE — Progress Notes (Signed)
Patient available for Revlimid refill according to [facility] Portal. Call placed to patient to schedule urine pregnancy test for refill - patient states she has revlimid pills left and will contact clinic when she is due for refill.

## 2022-01-14 DIAGNOSIS — Z419 Encounter for procedure for purposes other than remedying health state, unspecified: Secondary | ICD-10-CM | POA: Diagnosis not present

## 2022-01-20 ENCOUNTER — Other Ambulatory Visit: Payer: Self-pay

## 2022-01-20 ENCOUNTER — Inpatient Hospital Stay: Payer: Medicaid Other | Attending: Hematology

## 2022-01-20 DIAGNOSIS — C9000 Multiple myeloma not having achieved remission: Secondary | ICD-10-CM | POA: Insufficient documentation

## 2022-01-20 DIAGNOSIS — Z7689 Persons encountering health services in other specified circumstances: Secondary | ICD-10-CM | POA: Diagnosis not present

## 2022-01-20 LAB — PREGNANCY, URINE: Preg Test, Ur: NEGATIVE

## 2022-01-20 MED ORDER — LENALIDOMIDE 10 MG PO CAPS: ORAL_CAPSULE | ORAL | 0 refills | Status: DC

## 2022-01-20 NOTE — Telephone Encounter (Signed)
Chart reviewed. Revlimid refilled per last office note with Dr. Katragadda.  

## 2022-02-02 ENCOUNTER — Other Ambulatory Visit (HOSPITAL_COMMUNITY): Payer: Self-pay

## 2022-02-02 ENCOUNTER — Telehealth: Payer: Self-pay

## 2022-02-02 NOTE — Telephone Encounter (Addendum)
Patient Advocate Encounter  Prior Authorization for Dexcom G6 Sensor has been approved.    Key: [id number] PA# [id number] Effective dates: 01/19/2022 through 02/02/2023  Tilda Franco CPhT P: [phone] F: [phone]

## 2022-02-03 ENCOUNTER — Encounter: Payer: Medicaid Other | Admitting: Nurse Practitioner

## 2022-02-03 ENCOUNTER — Encounter: Payer: Self-pay | Admitting: Nurse Practitioner

## 2022-02-07 ENCOUNTER — Other Ambulatory Visit: Payer: Self-pay | Admitting: Nurse Practitioner

## 2022-02-08 ENCOUNTER — Ambulatory Visit: Payer: Medicaid Other

## 2022-02-08 ENCOUNTER — Other Ambulatory Visit: Payer: Medicaid Other | Admitting: Pharmacist

## 2022-02-08 NOTE — Patient Instructions (Signed)
Diamond Robbins,   Call [facility] to schedule follow up with Diamond Robbins.   Schedule follow up at [facility] with one of the new providers.   Check your blood pressure periodically, and any time you have concerning symptoms like headache, chest pain, dizziness, shortness of breath, or vision changes.   Our goal is less than 130/80.  To appropriately check your blood pressure, make sure you do the following:  1) Avoid caffeine, exercise, or tobacco products for 30 minutes before checking. Empty your bladder. 2) Sit with your back supported in a flat-backed chair. Rest your arm on something flat (arm of the chair, table, etc). 3) Sit still with your feet flat on the floor, resting, for at least 5 minutes.  4) Check your blood pressure. Take 1-2 readings.  5) Write down these readings and bring with you to any provider appointments.  Bring your home blood pressure machine with you to a provider's office for accuracy comparison at least once a year.   Make sure you take your blood pressure medications before you come to any office visit, even if you were asked to fast for labs.  Take care!  [name], PharmD, [facility] [phone]

## 2022-02-08 NOTE — Progress Notes (Signed)
Chief Complaint  Patient presents with   Diabetes   Medication Management    Diamond Robbins is a 31 y.o. year old female who presented for a telephone visit.   They were referred to the pharmacist by their PCP for assistance in managing diabetes.   Patient is participating in a Managed Medicaid Plan:  Yes  Subjective:  Care Team: Primary Care Provider: Renee Rival, FNP ; Next Scheduled Visit: needs to reschedule Endocrinologist Reardon; Next Scheduled Visit: needs to reschedule  Medication Access/Adherence  Current Pharmacy:  [place], [place] - [address] [place] [address] [place] [address] Phone: [phone] Fax: [phone]  [place], [place] - [address] [place] [address] Phone: [phone] Fax: [phone]  [place], [place] [address] [place] [address] Phone: [phone] Fax: [phone]  [place], [place] [address] Phone: [phone] Fax: [phone]   Patient reports affordability concerns with their medications: No  Patient reports access/transportation concerns to their pharmacy: No  Patient reports adherence concerns with their medications:  No     Diabetes:  Current medications: Lantus 35 units daily, Novolog per sliding scale  Current glucose readings: reports she believes she is in range ~ 30% of time, but is having a hard time manipulating the app today. Missed appointment with [name]   Patient denies hypoglycemic s/sx including dizziness, shakiness, sweating.   Hypertension:  Current medications: amlodipine 5 mg daily, losartan 25 mg daily, metoprolol tartrate 12.5 mg twice daily    Patient has a validated, automated, upper arm home BP cuff Current blood  pressure readings readings: does not recall any specific readings but does note they were "in range"  Hyperlipidemia/ASCVD Risk Reduction  Current lipid lowering medications: rosuvastatin 20 mg daily  Antiplatelet regimen: aspirin 325 mg daily (per hem/onc)  Health Maintenance  Health Maintenance Due  Topic Date Due   COVID-19 Vaccine (1) Never done   TETANUS/TDAP  Never done   INFLUENZA VACCINE  12/14/2021   HEMOGLOBIN A1C  12/19/2021     Objective: Lab Results  Component Value Date   HGBA1C 10.0 (H) 06/21/2021    Lab Results  Component Value Date   CREATININE 0.63 11/24/2021   BUN 11 11/24/2021   NA 138 11/24/2021   K 3.7 11/24/2021   CL 106 11/24/2021   CO2 24 11/24/2021    Lab Results  Component Value Date   CHOL 158 06/21/2021   HDL 62 06/21/2021   LDLCALC 74 06/21/2021   TRIG 125 06/21/2021   CHOLHDL 2.5 06/21/2021    Medications Reviewed Today     Reviewed by Osker Mason, RPH-CPP (Pharmacist) on 12/28/21 at [phone]  Med List Status: <None>   Medication Order Taking? Sig Documenting Provider Last Dose Status Informant  ACCU-CHEK GUIDE test strip [id number] No Use to check blood glucose 4 times daily.  Patient not taking: Reported on 12/28/2021   Brita Romp, NP Not Taking Active   Accu-Chek Softclix Lancets lancets [id number] No Use to check blood glucose 4 times daily.  Patient not taking: Reported on 12/28/2021   Brita Romp, NP Not Taking Active   acyclovir (ZOVIRAX) 800 MG tablet [id number]  Take 800 mg by mouth daily. [provider]  Active   amLODipine (NORVASC) 5 MG tablet [id number] Yes Take 1 tablet (5 mg total) by mouth daily. Renee Rival, FNP Taking Active   aspirin 325 MG tablet [id number] Yes Take 1 tablet by mouth daily. [provider] Taking Active   Blood Glucose Monitoring Suppl (ACCU-CHEK GUIDE) w/Device KIT [id number]   [provider]  Active Self  Blood Pressure Monitor KIT [id number]   Use to check blood pressure as instructed Fayrene Helper, MD  Active   calcium carbonate (OS-CAL) 1250 (500 Ca) MG chewable tablet [id number] No Chew 1 tablet by mouth daily.  Patient not taking: Reported on 12/28/2021   [provider] Not Taking Active   cetirizine (ZYRTEC) 10 MG tablet [id number] No Take 1 tablet by mouth once daily  Patient not taking: Reported on 12/28/2021   Renee Rival, FNP Not Taking Active   cholecalciferol (VITAMIN D3) 25 MCG (1000 UNIT) tablet [id number] Yes Take 1,000 Units by mouth daily. [provider] Taking Active   Continuous Blood Gluc Sensor (DEXCOM G6 SENSOR) MISC [id number]  Change sensor every 10 days as directed Brita Romp, NP  Active   Continuous Blood Gluc Transmit (DEXCOM G6 TRANSMITTER) MISC [id number]  Change transmitter every 90 days as directed. Brita Romp, NP  Active   fluticasone [facility]) 50 MCG/ACT nasal spray [id number] Yes Place 2 sprays into both nostrils daily. Renee Rival, FNP Taking Active            Med Note Wynetta Emery, JOLIZA   Thu Sep 09, 2021 10:52 AM) As needed  folic acid (FOLVITE) 1 MG tablet [id number] No Take 1 tablet by mouth once daily  Patient not taking: Reported on 12/28/2021   Renee Rival, FNP Not Taking Active   insulin aspart (NOVOLOG FLEXPEN) 100 UNIT/ML FlexPen [id number] Yes Inject 5-11 Units into the skin 3 (three) times daily with meals. Brita Romp, NP Taking Active   Insulin Glargine Solostar (LANTUS) 100 UNIT/ML Solostar Pen [id number]  Inject 35 Units into the skin daily. [provider]  Active   lenalidomide (REVLIMID) 10 MG capsule [id number] Yes Take 1 capsule daily for 21 days, then take 7 days off [name], MD Taking Active   losartan (COZAAR) 25 MG tablet [id number] Yes Take 1 tablet (25 mg total) by mouth daily. Renee Rival, FNP Taking Active   metoprolol tartrate (LOPRESSOR) 25 MG tablet [id number] Yes Take 1/2 (one-half)  tablet by mouth twice daily Paseda, Dewaine Conger, FNP Taking Active   Multiple Vitamin (MULTIVITAMIN) tablet [id number] Yes Take 1 tablet by mouth daily. [provider] Taking Active   rosuvastatin (CRESTOR) 20 MG tablet [id number] Yes Take 1 tablet by mouth daily. [provider] Taking Active   UNABLE TO FIND [id number]  Blood pressure cuff DX I10 Paseda, Dewaine Conger, FNP  Active   Med List Note [name], CPhT 03/07/13 1442): No preferred pharmacy.               Assessment/Plan:   Diabetes: - Currently uncontrolled - Reviewed goal A1c, goal fasting, and goal 2 hour post prandial glucose, goal time in range of 70%.  - Recommend to scheduled endocrinology follow up - Recommended to continue current regimen at this time   Hypertension: - Currently controlled - Reviewed appropriate blood pressure monitoring technique and reviewed goal blood pressure. Recommended to check home blood pressure and heart rate periodically - Recommend to continue current regimen at this time  Hyperlipidemia/ASCVD Risk Reduction: - Currently controlled.  - Recommend to continue current regimen at this time     Follow Up Plan: phone call in 6 weeks  Catie TJodi Mourning, PharmD, [facility] [phone]

## 2022-02-10 ENCOUNTER — Telehealth: Payer: Self-pay

## 2022-02-10 ENCOUNTER — Other Ambulatory Visit (HOSPITAL_COMMUNITY): Payer: Self-pay

## 2022-02-10 NOTE — Telephone Encounter (Signed)
PA renewal request received from [facility] through CoverMyMeds.   PA has been submitted and is awaiting determination.   KeyLawerance Cruel PA Case ID: [id number]

## 2022-02-13 DIAGNOSIS — Z419 Encounter for procedure for purposes other than remedying health state, unspecified: Secondary | ICD-10-CM | POA: Diagnosis not present

## 2022-02-14 NOTE — Telephone Encounter (Signed)
Received notification from [facility] regarding a prior authorization for Dexcom [employer]. Authorization has been APPROVED

## 2022-02-21 ENCOUNTER — Other Ambulatory Visit: Payer: Self-pay | Admitting: Nurse Practitioner

## 2022-02-22 ENCOUNTER — Inpatient Hospital Stay: Payer: Medicaid Other | Attending: Hematology

## 2022-02-22 ENCOUNTER — Other Ambulatory Visit: Payer: Self-pay

## 2022-02-22 DIAGNOSIS — I10 Essential (primary) hypertension: Secondary | ICD-10-CM | POA: Insufficient documentation

## 2022-02-22 DIAGNOSIS — C9000 Multiple myeloma not having achieved remission: Secondary | ICD-10-CM | POA: Diagnosis not present

## 2022-02-22 DIAGNOSIS — E109 Type 1 diabetes mellitus without complications: Secondary | ICD-10-CM | POA: Diagnosis not present

## 2022-02-22 LAB — CBC WITH DIFFERENTIAL/PLATELET
Abs Immature Granulocytes: 0.03 10*3/uL (ref 0.00–0.07)
Basophils Absolute: 0.1 10*3/uL (ref 0.0–0.1)
Basophils Relative: 2 %
Eosinophils Absolute: 0.1 10*3/uL (ref 0.0–0.5)
Eosinophils Relative: 4 %
HCT: 42.7 % (ref 36.0–46.0)
Hemoglobin: 13.6 g/dL (ref 12.0–15.0)
Immature Granulocytes: 1 %
Lymphocytes Relative: 46 %
Lymphs Abs: 1.6 10*3/uL (ref 0.7–4.0)
MCH: 27.4 pg (ref 26.0–34.0)
MCHC: 31.9 g/dL (ref 30.0–36.0)
MCV: 85.9 fL (ref 80.0–100.0)
Monocytes Absolute: 0.4 10*3/uL (ref 0.1–1.0)
Monocytes Relative: 11 %
Neutro Abs: 1.2 10*3/uL — ABNORMAL LOW (ref 1.7–7.7)
Neutrophils Relative %: 36 %
Platelets: 277 10*3/uL (ref 150–400)
RBC: 4.97 MIL/uL (ref 3.87–5.11)
RDW: 14.2 % (ref 11.5–15.5)
WBC: 3.4 10*3/uL — ABNORMAL LOW (ref 4.0–10.5)
nRBC: 0 % (ref 0.0–0.2)

## 2022-02-22 LAB — COMPREHENSIVE METABOLIC PANEL
ALT: 49 U/L — ABNORMAL HIGH (ref 0–44)
AST: 32 U/L (ref 15–41)
Albumin: 4.3 g/dL (ref 3.5–5.0)
Alkaline Phosphatase: 67 U/L (ref 38–126)
Anion gap: 9 (ref 5–15)
BUN: 10 mg/dL (ref 6–20)
CO2: 25 mmol/L (ref 22–32)
Calcium: 9.5 mg/dL (ref 8.9–10.3)
Chloride: 105 mmol/L (ref 98–111)
Creatinine, Ser: 0.72 mg/dL (ref 0.44–1.00)
GFR, Estimated: >60 mL/min (ref >60)
Glucose, Bld: 191 mg/dL — ABNORMAL HIGH (ref 70–99)
Potassium: 3.5 mmol/L (ref 3.5–5.1)
Sodium: 139 mmol/L (ref 135–145)
Total Bilirubin: 0.6 mg/dL (ref 0.3–1.2)
Total Protein: 8.1 g/dL (ref 6.5–8.1)

## 2022-02-22 LAB — MAGNESIUM: Magnesium: 1.9 mg/dL (ref 1.7–2.4)

## 2022-02-22 LAB — PREGNANCY, URINE: Preg Test, Ur: NEGATIVE

## 2022-02-22 MED ORDER — LENALIDOMIDE 10 MG PO CAPS: ORAL_CAPSULE | ORAL | 0 refills | Status: DC

## 2022-02-22 NOTE — Telephone Encounter (Signed)
Chart reviewed. Revlimid refilled per last office note with Dr. Katragadda.  

## 2022-02-23 LAB — KAPPA/LAMBDA LIGHT CHAINS
Kappa free light chain: 24.1 mg/L — ABNORMAL HIGH (ref 3.3–19.4)
Kappa, lambda light chain ratio: 1.62 (ref 0.26–1.65)
Lambda free light chains: 14.9 mg/L (ref 5.7–26.3)

## 2022-02-24 ENCOUNTER — Other Ambulatory Visit: Payer: Medicaid Other

## 2022-02-24 LAB — PROTEIN ELECTROPHORESIS, SERUM
A/G Ratio: 1 (ref 0.7–1.7)
Albumin ELP: 3.8 g/dL (ref 2.9–4.4)
Alpha-1-Globulin: 0.2 g/dL (ref 0.0–0.4)
Alpha-2-Globulin: 0.9 g/dL (ref 0.4–1.0)
Beta Globulin: 1.2 g/dL (ref 0.7–1.3)
Gamma Globulin: 1.3 g/dL (ref 0.4–1.8)
Globulin, Total: 3.7 g/dL (ref 2.2–3.9)
Total Protein ELP: 7.5 g/dL (ref 6.0–8.5)

## 2022-02-28 LAB — IMMUNOFIXATION ELECTROPHORESIS
IgA: 106 mg/dL (ref 87–352)
IgG (Immunoglobin G), Serum: 1252 mg/dL (ref 586–1602)
IgM (Immunoglobulin M), Srm: 25 mg/dL — ABNORMAL LOW (ref 26–217)
Total Protein ELP: 7.5 g/dL (ref 6.0–8.5)

## 2022-03-02 ENCOUNTER — Inpatient Hospital Stay (HOSPITAL_BASED_OUTPATIENT_CLINIC_OR_DEPARTMENT_OTHER): Payer: Medicaid Other | Admitting: Hematology

## 2022-03-02 ENCOUNTER — Inpatient Hospital Stay: Payer: Medicaid Other

## 2022-03-02 VITALS — BP 112/90 | HR 83 | Temp 98.1°F | Resp 17

## 2022-03-02 VITALS — BP 130/90 | HR 87 | Temp 97.9°F | Resp 16 | Ht 63.0 in | Wt 187.8 lb

## 2022-03-02 DIAGNOSIS — C9000 Multiple myeloma not having achieved remission: Secondary | ICD-10-CM

## 2022-03-02 DIAGNOSIS — I10 Essential (primary) hypertension: Secondary | ICD-10-CM | POA: Diagnosis not present

## 2022-03-02 DIAGNOSIS — E109 Type 1 diabetes mellitus without complications: Secondary | ICD-10-CM | POA: Diagnosis not present

## 2022-03-02 MED ORDER — ZOLEDRONIC ACID 4 MG/100ML IV SOLN
4.0000 mg | Freq: Once | INTRAVENOUS | Status: AC
  Administered 2022-03-02: 4 mg via INTRAVENOUS
  Filled 2022-03-02: qty 100

## 2022-03-02 MED ORDER — SODIUM CHLORIDE 0.9 % IV SOLN: Freq: Once | INTRAVENOUS | Status: AC

## 2022-03-02 NOTE — Progress Notes (Signed)
[place], [place] [address]   CLINIC:  Medical Oncology/Hematology  PCP:  Renee Rival, FNP [address] 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, a 31 y.o. female, seen for follow-up of for multiple myeloma.  She is currently on Revlimid maintenance.  She denies any fevers or infections in the last 3 months.  Denies any tingling or numbness in the extremities.  REVIEW OF SYSTEMS:  Review of Systems  Neurological:  Negative for numbness.  All other systems reviewed and are negative.   PAST MEDICAL/SURGICAL HISTORY:  Past Medical History:  Diagnosis Date   Cancer ([facility]) 08/01/2020   multiple myeloma   DKA (diabetic ketoacidosis) ([facility]) 02/23/2020   Hypertension    Type 1 diabetes mellitus ([facility]) 02/23/2020   Past Surgical History:  Procedure Laterality Date   BONE MARROW TRANSPLANT     NO PAST SURGERIES      SOCIAL HISTORY:  Social History   Socioeconomic History   Marital status: Married    Spouse name: Not on file   Number of children: Not on file   Years of education: Not on file   Highest education level: Not on file  Occupational History   Occupation: unemployed    Comment: can go back to work Jan 2023  Tobacco Use   Smoking status: Never   Smokeless tobacco: Never  Vaping Use   Vaping Use: Never used  Substance and Sexual Activity   Alcohol use: Not Currently    Comment: occ- 0-1 drinks per week; maybe 1 drink every 6 months   Drug use: No   Sexual activity: Yes    Birth control/protection: Condom  Other Topics Concern   Not on file  Social History Narrative   Has 2 step-daughters   Social Determinants of Health   Financial Resource Strain: Low Risk   (03/09/2020)   Overall Financial Resource Strain (CARDIA)    Difficulty of Paying Living Expenses: Not hard at all  Food Insecurity: No Food Insecurity (04/07/2021)   Hunger Vital Sign    Worried About Running Out of Food in the Last Year: Never true    [place] in the Last Year: Never true  Transportation Needs: No Transportation Needs (06/08/2021)   PRAPARE - Hydrologist (Medical): No    Lack of Transportation (Non-Medical): No  Physical Activity: Sufficiently Active (06/08/2021)   Exercise Vital Sign    Days of Exercise per Week: 3 days    Minutes of Exercise per Session: 60 min  Stress: No Stress Concern Present (03/09/2020)   [place]    Feeling of Stress : Not at all  Social Connections: [place] (03/08/2021)   Social Connection and Isolation Panel [NHANES]    Frequency of Communication with Friends and Family: More than three times a week    Frequency of Social Gatherings with Friends and Family: More than three times a week    Attends Religious Services: More than 4 times per year    Active Member of [employer] or Organizations: Yes    Attends Archivist Meetings: Never    Marital Status: Married  Human resources officer  Violence: Not At Risk (03/09/2020)   Humiliation, Afraid, Rape, and Kick questionnaire    Fear of Current or Ex-Partner: No    Emotionally Abused: No    Physically Abused: No    Sexually Abused: No    FAMILY HISTORY:  Family History  Problem Relation Age of Onset   Hypertension Mother    Diabetes Mother    Hypertension Father    Diabetes Maternal Grandmother     CURRENT MEDICATIONS:  Current Outpatient Medications  Medication Sig Dispense Refill   ACCU-CHEK GUIDE test strip Use to check blood glucose 4 times daily. 125 each 1   Accu-Chek Softclix Lancets lancets Use to check blood glucose 4 times daily. 200 each 0   amLODipine  (NORVASC) 5 MG tablet Take 1 tablet (5 mg total) by mouth daily. 90 tablet 1   aspirin 325 MG tablet Take 1 tablet by mouth daily.     Blood Glucose Monitoring Suppl (ACCU-CHEK GUIDE) w/Device KIT      Blood Pressure Monitor KIT Use to check blood pressure as instructed 1 kit 0   calcium carbonate (OS-CAL) 1250 (500 Ca) MG chewable tablet Chew 1 tablet by mouth daily.     cetirizine (ZYRTEC) 10 MG tablet Take 1 tablet by mouth once daily 30 tablet 0   cholecalciferol (VITAMIN D3) 25 MCG (1000 UNIT) tablet Take 1,000 Units by mouth daily.     Continuous Blood Gluc Sensor (DEXCOM G6 SENSOR) MISC Change sensor every 10 days as directed 9 each 3   Continuous Blood Gluc Transmit (DEXCOM G6 TRANSMITTER) MISC Change transmitter every 90 days as directed. 1 each 3   fluticasone (FLONASE) 50 MCG/ACT nasal spray Place 2 sprays into both nostrils daily. 16 g 6   insulin aspart (NOVOLOG FLEXPEN) 100 UNIT/ML FlexPen Inject 5-11 Units into the skin 3 (three) times daily with meals. 15 mL 3   Insulin Glargine Solostar (LANTUS) 100 UNIT/ML Solostar Pen Inject 35 Units into the skin daily.     lenalidomide (REVLIMID) 10 MG capsule Take 1 capsule daily for 21 days, then take 7 days off 21 capsule 0   losartan (COZAAR) 25 MG tablet Take 1 tablet (25 mg total) by mouth daily. 90 tablet 1   metoprolol tartrate (LOPRESSOR) 25 MG tablet Take 1/2 (one-half) tablet by mouth twice daily 30 tablet 0   Multiple Vitamin (MULTIVITAMIN) tablet Take 1 tablet by mouth daily.     rosuvastatin (CRESTOR) 20 MG tablet Take 1 tablet by mouth daily.     UNABLE TO FIND Blood pressure cuff DX I10 1 each 0   No current facility-administered medications for this visit.    ALLERGIES:  No Known Allergies  PHYSICAL EXAM:  Performance status (ECOG): 1 - Symptomatic but completely ambulatory  Vitals:   03/02/22 1054  BP: (!) 130/90  Pulse: 87  Resp: 16  Temp: 97.9 F (36.6 C)  SpO2: 99%   Wt Readings from Last 3 Encounters:   03/02/22 187 lb 12.8 oz (85.2 kg)  12/15/21 185 lb (83.9 kg)  12/01/21 186 lb 1.6 oz (84.4 kg)   Physical Exam Vitals reviewed.  Constitutional:      Appearance: Normal appearance. She is obese.  Cardiovascular:     Rate and Rhythm: Normal rate and regular rhythm.     Pulses: Normal pulses.     Heart sounds: Normal heart sounds.  Pulmonary:     Effort: Pulmonary effort is normal.     Breath sounds: Normal breath sounds.  Musculoskeletal:  Right lower leg: No edema.     Left lower leg: No edema.  Neurological:     General: No focal deficit present.     Mental Status: She is alert and oriented to person, place, and time.  Psychiatric:        Mood and Affect: Mood normal.        Behavior: Behavior normal.     LABORATORY DATA:  I have reviewed the labs as listed.     Latest Ref Rng & Units 02/22/2022    9:00 AM 11/24/2021    9:33 AM 09/08/2021   10:19 AM  CBC  WBC 4.0 - 10.5 K/uL 3.4  3.4  4.7   Hemoglobin 12.0 - 15.0 g/dL 13.6  13.6  14.4   Hematocrit 36.0 - 46.0 % 42.7  42.3  45.5   Platelets 150 - 400 K/uL 277  253  297       Latest Ref Rng & Units 02/22/2022    9:00 AM 11/24/2021    9:33 AM 09/08/2021   10:19 AM  CMP  Glucose 70 - 99 mg/dL 191  177  191   BUN 6 - 20 mg/dL 10  11  11    Creatinine 0.44 - 1.00 mg/dL 0.72  0.63  0.61   Sodium 135 - 145 mmol/L 139  138  136   Potassium 3.5 - 5.1 mmol/L 3.5  3.7  3.8   Chloride 98 - 111 mmol/L 105  106  101   CO2 22 - 32 mmol/L 25  24  25    Calcium 8.9 - 10.3 mg/dL 9.5  9.4  9.7   Total Protein 6.5 - 8.1 g/dL 8.1  8.0  8.8   Total Bilirubin 0.3 - 1.2 mg/dL 0.6  0.5  0.5   Alkaline Phos 38 - 126 U/L 67  72  86   AST 15 - 41 U/L 32  30  28   ALT 0 - 44 U/L 49  48  42       Component Value Date/Time   RBC 4.97 02/22/2022 0900   MCV 85.9 02/22/2022 0900   MCV 82 02/18/2021 0934   MCH 27.4 02/22/2022 0900   MCHC 31.9 02/22/2022 0900   RDW 14.2 02/22/2022 0900   RDW 13.2 02/18/2021 0934   LYMPHSABS 1.6  02/22/2022 0900   LYMPHSABS 1.1 02/18/2021 0934   MONOABS 0.4 02/22/2022 0900   EOSABS 0.1 02/22/2022 0900   EOSABS 0.5 (H) 02/18/2021 0934   BASOSABS 0.1 02/22/2022 0900   BASOSABS 0.0 02/18/2021 0934    DIAGNOSTIC IMAGING:  I have independently reviewed the scans and discussed with the patient. No results found.   ASSESSMENT:  1.  Stage III IgA kappa multiple myeloma: -Presentation to [facility] [place] with confusion and weakness. -Found to have diabetic ketoacidosis with newly diagnosed type 1 diabetes. -Also found to have acute renal failure and hypercalcemia. -Bone marrow biopsy on 02/25/2020 with flow cytometry with the population CD138 positive, CD56 positive IgA kappa monoclonal plasma cells 12%.  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 -Ultrasound abdomen on 02/23/2020 showed increased hepatic echogenicity with normal spleen. -Renal ultrasound on 02/24/2020 was normal. -CT chest on 02/22/2020 with moth eaten appearance of the bones. -Beta-2 microglobulin 2.1.  LDH 322.  SPEP and immunofixation were normal.  Kappa light chains are elevated at 36.8.  Lambda light chains 12.9 and ratio of 2.85. -24-hour  urine immunofixation was negative.  M spike was negative. -PET scan on 03/23/2020 showed no FDG radiotracer activity, no soft tissue plasmacytoma.  Diffuse multiple small lytic lesions within the pelvis, spine and vertebral bodies consistent with myeloma. -Chromosome analysis was 22, XX.  Multiple myeloma FISH panel was normal. -She is considered high risk based on elevated LDH level. -4 cycles of Dara VRD from 03/31/2020 through 06/12/2020. 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 - BMBX on 12/15/2020 with variably normocellular 30 to 70% with trilineage hematopoiesis.  No increase in plasma cells.  Myeloma FISH panel was normal.  Chromosome  analysis was normal. - MRD results were negative. - Maintenance Revlimid 10 mg 3 weeks on/1 week off started around 12/08/2020.   2.  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  Non-smoker. -No family history of myeloma or other malignancies.   PLAN:  1.  Stage III IgA kappa multiple myeloma: - She is tolerating Revlimid very well. - Reviewed labs from 02/22/2022 with normal creatinine and calcium.  ALT is elevated at 49.  Rest of LFTs are normal.  M spike was not detectable.  CBC shows mild leukopenia.  Kappa light chains at 24.1 and ratio is normal.  Immunofixation was unremarkable. - Recommend continuing Revlimid 10 mg 3 weeks on/1 week off. - RTC 12 weeks for follow-up with repeat myeloma labs.   2.  Type I diabetes: - Continue Lantus 35 units at bedtime and NovoLog sliding scale.   3.  Hypertension: - Continue losartan 25 mg daily, metoprolol 25 mg half tablet twice daily and Norvasc 5 mg daily.   4.  Bone protection:  - Calcium is 9.5.  Continue calcium supplements.  Continue Zometa today and every 12 weeks.  Orders placed this encounter:  No orders of the defined types were placed in this encounter.    [name], MD [place] [phone]   I, Thana Ates, am acting as a scribe for Dr. [name].  I, [name] MD, have reviewed the above documentation for accuracy and completeness, and I agree with the above.

## 2022-03-02 NOTE — Patient Instructions (Signed)
[facility]  Discharge Instructions: Thank you for choosing [facility] to provide your oncology and hematology care.  If you have a lab appointment with the Cancer Center, please come in thru the Main Entrance and check in at the main information desk.  Wear comfortable clothing and clothing appropriate for easy access to any Portacath or PICC line.   We strive to give you quality time with your provider. You may need to reschedule your appointment if you arrive late (15 or more minutes).  Arriving late affects you and other patients whose appointments are after yours.  Also, if you miss three or more appointments without notifying the office, you may be dismissed from the clinic at the provider's discretion.      For prescription refill requests, have your pharmacy contact our office and allow 72 hours for refills to be completed.    Today you received the following chemotherapy and/or immunotherapy agents Zometa      To help prevent nausea and vomiting after your treatment, we encourage you to take your nausea medication as directed.  BELOW ARE SYMPTOMS THAT SHOULD BE REPORTED IMMEDIATELY: *FEVER GREATER THAN 100.4 F (38 C) OR HIGHER *CHILLS OR SWEATING *NAUSEA AND VOMITING THAT IS NOT CONTROLLED WITH YOUR NAUSEA MEDICATION *UNUSUAL SHORTNESS OF BREATH *UNUSUAL BRUISING OR BLEEDING *URINARY PROBLEMS (pain or burning when urinating, or frequent urination) *BOWEL PROBLEMS (unusual diarrhea, constipation, pain near the anus) TENDERNESS IN MOUTH AND THROAT WITH OR WITHOUT PRESENCE OF ULCERS (sore throat, sores in mouth, or a toothache) UNUSUAL RASH, SWELLING OR PAIN  UNUSUAL VAGINAL DISCHARGE OR ITCHING   Items with * indicate a potential emergency and should be followed up as soon as possible or go to the Emergency Department if any problems should occur.  Please show the CHEMOTHERAPY ALERT CARD or IMMUNOTHERAPY ALERT CARD at check-in to the Emergency  Department and triage nurse.  Should you have questions after your visit or need to cancel or reschedule your appointment, please contact [facility] [phone]  and follow the prompts.  Office hours are 8:00 a.m. to 4:30 p.m. Monday - Friday. Please note that voicemails left after 4:00 p.m. may not be returned until the following business day.  We are closed weekends and major holidays. You have access to a nurse at all times for urgent questions. Please call the main number to the clinic [phone] and follow the prompts.  For any non-urgent questions, you may also contact your provider using MyChart. We now offer e-Visits for anyone 18 and older to request care online for non-urgent symptoms. For details visit [email or web address].   Also download the MyChart app! Go to the app store, search "MyChart", open the app, select [facility], and log in with your MyChart username and password.  Masks are optional in the cancer centers. If you would like for your care team to wear a mask while they are taking care of you, please let them know. You may have one support person who is at least 31 years old accompany you for your appointments.  

## 2022-03-02 NOTE — Progress Notes (Signed)
Patient presents today for Zometa infusion per providers order.  Vital signs within parameters for treatment.  Patient has no new complaints at this time.  Message received from Anastasio Champion RN/Dr. Delton Coombes, patient okay for treatment.  Peripheral IV started and blood return noted pre and post infusion.  Treatment given today per MD orders.  Stable during infusion without adverse affects.  Vital signs stable.  No complaints at this time.  Discharge from clinic ambulatory in stable condition.  Alert and oriented X 3.  Follow up with [facility] as scheduled.

## 2022-03-02 NOTE — Patient Instructions (Addendum)
[facility] Discharge Instructions   You were seen and examined today by Dr. Delton Coombes.  He reviewed the results of your lab work which are normal.   Continue Revlimid as prescribed.   We will give your Zometa infusion today.   We will see you back in 3 months. We will check lab work 1 week prior to this appointment.   Thank you for choosing [place] at [facility] to provide your oncology and hematology care.  To afford each patient quality time with our provider, please arrive at least 15 minutes before your scheduled appointment time.   If you have a lab appointment with the [facility] please come in thru the Main Entrance and check in at the main information desk.  You need to re-schedule your appointment should you arrive 10 or more minutes late.  We strive to give you quality time with our providers, and arriving late affects you and other patients whose appointments are after yours.  Also, if you no show three or more times for appointments you may be dismissed from the clinic at the providers discretion.     Again, thank you for choosing [facility].  Our hope is that these requests will decrease the amount of time that you wait before being seen by our physicians.       _____________________________________________________________  Should you have questions after your visit to [facility], please contact our office at [phone] and follow the prompts.  Our office hours are 8:00 a.m. and 4:30 p.m. Monday - Friday.  Please note that voicemails left after 4:00 p.m. may not be returned until the following business day.  We are closed weekends and major holidays.  You do have access to a nurse 24-7, just call the main number to the clinic [phone] and do not press any options, hold on the line and a nurse will answer the phone.    For prescription refill requests, have your pharmacy  contact our office and allow 72 hours.    Due to Covid, you will need to wear a mask upon entering the hospital. If you do not have a mask, a mask will be given to you at the Main Entrance upon arrival. For doctor visits, patients may have 1 support person age 69 or older with them. For treatment visits, patients can not have anyone with them due to social distancing guidelines and our immunocompromised population.

## 2022-03-03 ENCOUNTER — Ambulatory Visit (HOSPITAL_COMMUNITY): Payer: Medicaid Other | Admitting: Hematology

## 2022-03-03 ENCOUNTER — Ambulatory Visit (HOSPITAL_COMMUNITY): Payer: Medicaid Other

## 2022-03-16 DIAGNOSIS — Z419 Encounter for procedure for purposes other than remedying health state, unspecified: Secondary | ICD-10-CM | POA: Diagnosis not present

## 2022-03-22 ENCOUNTER — Other Ambulatory Visit: Payer: Medicaid Other | Admitting: Pharmacist

## 2022-03-23 ENCOUNTER — Other Ambulatory Visit: Payer: Self-pay

## 2022-03-23 ENCOUNTER — Inpatient Hospital Stay: Payer: Medicaid Other | Attending: Hematology

## 2022-03-23 DIAGNOSIS — C9000 Multiple myeloma not having achieved remission: Secondary | ICD-10-CM | POA: Diagnosis not present

## 2022-03-23 LAB — PREGNANCY, URINE: Preg Test, Ur: NEGATIVE

## 2022-03-23 MED ORDER — LENALIDOMIDE 10 MG PO CAPS: ORAL_CAPSULE | ORAL | 0 refills | Status: DC

## 2022-03-23 NOTE — Telephone Encounter (Signed)
Chart reviewed. Revlimid refilled per last office note with Dr. Katragadda.  

## 2022-04-15 DIAGNOSIS — Z419 Encounter for procedure for purposes other than remedying health state, unspecified: Secondary | ICD-10-CM | POA: Diagnosis not present

## 2022-04-19 ENCOUNTER — Ambulatory Visit: Payer: Medicaid Other | Admitting: Nurse Practitioner

## 2022-04-19 DIAGNOSIS — I10 Essential (primary) hypertension: Secondary | ICD-10-CM

## 2022-04-19 DIAGNOSIS — E1065 Type 1 diabetes mellitus with hyperglycemia: Secondary | ICD-10-CM

## 2022-04-19 DIAGNOSIS — E782 Mixed hyperlipidemia: Secondary | ICD-10-CM

## 2022-04-20 ENCOUNTER — Inpatient Hospital Stay: Payer: Medicaid Other | Attending: Hematology | Admitting: Hematology

## 2022-04-20 ENCOUNTER — Encounter: Payer: Self-pay | Admitting: Pharmacist

## 2022-04-20 DIAGNOSIS — C9000 Multiple myeloma not having achieved remission: Secondary | ICD-10-CM | POA: Insufficient documentation

## 2022-04-20 LAB — PREGNANCY, URINE: Preg Test, Ur: NEGATIVE

## 2022-04-21 ENCOUNTER — Other Ambulatory Visit: Payer: Self-pay

## 2022-04-21 MED ORDER — LENALIDOMIDE 10 MG PO CAPS: ORAL_CAPSULE | ORAL | 0 refills | Status: DC

## 2022-04-21 NOTE — Telephone Encounter (Signed)
Chart reviewed. Revlimid refilled per last office note with Dr. Katragadda.  

## 2022-04-21 NOTE — Telephone Encounter (Signed)
Please advise 

## 2022-04-25 ENCOUNTER — Other Ambulatory Visit: Payer: Medicaid Other | Admitting: Pharmacist

## 2022-04-25 ENCOUNTER — Encounter: Payer: Self-pay | Admitting: Pharmacist

## 2022-04-25 ENCOUNTER — Other Ambulatory Visit: Payer: Self-pay | Admitting: Nurse Practitioner

## 2022-04-25 NOTE — Patient Instructions (Signed)
Tangi,  It was great talking with you today!    Check your blood pressure daily. Write down these readings (including your heart rate) and send me those results in a week.      Our goal is less than 130/80.   To appropriately check your blood pressure, make sure you do the following:  1) Avoid caffeine, exercise, or tobacco products for 30 minutes before checking. Empty your bladder. 2) Sit with your back supported in a flat-backed chair. Rest your arm on something flat (arm of the chair, table, etc). 3) Sit still with your feet flat on the floor, resting, for at least 5 minutes.  4) Check your blood pressure. Take 1-2 readings.    Schedule follow up appointments with [facility] and [facility].   Take care!   Catie Jodi Mourning, PharmD

## 2022-04-25 NOTE — Progress Notes (Signed)
Erroneous encounter

## 2022-04-25 NOTE — Progress Notes (Signed)
04/25/2022 Name: Diamond Robbins MRN: [id number] DOB: 1990/11/17  Chief Complaint  Patient presents with   Medication Management   Diabetes   Hypertension    Diamond Robbins is a 31 y.o. year old female who presented for a telephone visit.   They were referred to the pharmacist by their PCP for assistance in managing diabetes.   Patient is participating in a Managed Medicaid Plan:  Yes  Subjective:  Care Team: Primary Care Provider: Pcp, No ; Next Scheduled Visit: needs to schedule Endocrinologist [place]; Next Scheduled Visit: needs to schedule  Medication Access/Adherence  Current Pharmacy:  [facility] [address], [place] - [address] [place] [address] [place] [address] Phone: [phone] Fax: [phone]  [place] - [address] [place] [address] Phone: [phone] Fax: [phone]  [place], [address] AT [place] [place] [address] Phone: [phone] Fax: [phone]  [place], [place] [address] Phone: [phone] Fax: [phone]   Patient reports affordability concerns with their medications: No  Patient reports access/transportation concerns to their pharmacy: No  Patient reports adherence concerns with their medications:  No     Diabetes:  Current medications: Lantus 35 units daily   Current glucose readings: using DexCom G6  Fastings 150-200s; 2 hour after 200s-250s;    Patient denies hypoglycemic s/sx including dizziness, shakiness, sweating. Patient denies hyperglycemic symptoms including polyuria, polydipsia, polyphagia, nocturia, neuropathy, blurred vision.  Current meal patterns: reports she has fluctuations based on eating habits;  Hypertension:  Current medications: amlodipine 5 mg  daily, metoprolol tartrate 25 mg twice daily, losartan 25 mg daily  Patient has a validated, automated, upper arm home BP cuff Current blood pressure readings readings: has not been checking recently  Patient denies hypotensive s/sx including dizziness, lightheadedness.  Patient denies hypertensive symptoms including headache, chest pain, shortness of breath   Health Maintenance  Health Maintenance Due  Topic Date Due   COVID-19 Vaccine (1) Never done   INFLUENZA VACCINE  12/14/2021   HEMOGLOBIN A1C  12/19/2021   FOOT EXAM  02/18/2022     Objective: Lab Results  Component Value Date   HGBA1C 10.0 (H) 06/21/2021    Lab Results  Component Value Date   CREATININE 0.72 02/22/2022   BUN 10 02/22/2022   NA 139 02/22/2022   K 3.5 02/22/2022   CL 105 02/22/2022   CO2 25 02/22/2022    Lab Results  Component Value Date   CHOL 158 06/21/2021   HDL 62 06/21/2021   LDLCALC 74 06/21/2021   TRIG 125 06/21/2021   CHOLHDL 2.5 06/21/2021    Medications Reviewed Today     Reviewed by Diamond Robbins, RPH-CPP (Pharmacist) on 04/25/22 at [phone]  Med List Status: <None>   Medication Order Taking? Sig Documenting Provider Last Dose Status Informant  ACCU-CHEK GUIDE test strip [id number] Yes Use to check blood glucose 4 times daily. Diamond Romp, NP Taking Active   Accu-Chek Softclix Lancets lancets [id number] Yes Use to check blood glucose 4 times daily. Diamond Romp, NP Taking Active   amLODipine (NORVASC) 5 MG tablet [id number] Yes Take 1 tablet (5 mg total) by mouth daily. Diamond Rival, FNP Taking Active   aspirin 325 MG tablet [id number] Yes Take  1 tablet by mouth daily. Provider, Historical, Robbins Taking Active   Blood Glucose Monitoring Suppl (ACCU-CHEK GUIDE) w/Device KIT [id number] Yes  Provider, Historical, Robbins Taking Active Self  Blood Pressure Monitor KIT [id number] Yes Use to check blood pressure as instructed Diamond Robbins Taking Active   calcium  carbonate (OS-CAL) 1250 (500 Ca) MG chewable tablet [id number] Yes Chew 1 tablet by mouth daily. Provider, Historical, Robbins Taking Active   cetirizine (ZYRTEC) 10 MG tablet [id number] Yes Take 1 tablet by mouth once daily Diamond Robbins, Diamond Conger, FNP Taking Active   cholecalciferol (VITAMIN D3) 25 MCG (1000 UNIT) tablet [id number] Yes Take 1,000 Units by mouth daily. Provider, Historical, Robbins Taking Active   Continuous Blood Gluc Sensor (DEXCOM G6 SENSOR) MISC [id number] Yes Change sensor every 10 days as directed Diamond Romp, NP Taking Active   Continuous Blood Gluc Transmit (DEXCOM G6 TRANSMITTER) MISC [id number] Yes Change transmitter every 90 days as directed. Diamond Romp, NP Taking Active   fluticasone [facility]) 50 MCG/ACT nasal spray [id number] Yes Place 2 sprays into both nostrils daily. Diamond Rival, FNP Taking Active            Med Note Diamond Robbins, Diamond Robbins   Thu Sep 09, 2021 10:52 AM) As needed  insulin aspart (NOVOLOG FLEXPEN) 100 UNIT/ML FlexPen [id number] Yes Inject 5-11 Units into the skin 3 (three) times daily with meals. Diamond Romp, NP Taking Active   Insulin Glargine Solostar (LANTUS) 100 UNIT/ML Solostar Pen [id number] Yes Inject 35 Units into the skin daily. Provider, Historical, Robbins Taking Active   lenalidomide (REVLIMID) 10 MG capsule [id number] Yes Take 1 capsule daily for 21 days, then take 7 days off Diamond Robbins Taking Active   losartan (COZAAR) 25 MG tablet [id number] Yes Take 1 tablet (25 mg total) by mouth daily. Diamond Rival, FNP Taking Active   metoprolol tartrate (LOPRESSOR) 25 MG tablet [id number] Yes Take 1/2 (one-half) tablet by mouth twice daily Diamond Robbins, Diamond Conger, FNP Taking Active   Multiple Vitamin (MULTIVITAMIN) tablet [id number] Yes Take 1 tablet by mouth daily. Provider, Historical, Robbins Taking Active   rosuvastatin (CRESTOR) 20 MG tablet [id number] Yes Take 1 tablet by mouth daily. Provider, Historical, Robbins Taking Active   UNABLE TO FIND  [id number] Yes Blood pressure cuff DX I10 Diamond Robbins, Diamond Conger, FNP Taking Active   Med List Note Diamond Robbins, CPhT 03/07/13 1442): No preferred pharmacy.               Assessment/Plan:   Diabetes: - Currently uncontrolled - Reviewed long term cardiovascular and renal outcomes of uncontrolled blood sugar - Reviewed goal A1c, goal fasting, and goal 2 hour post prandial glucose - Recommend to schedule follow up with endocrinology for medication management - Recommend to check glucose continuously using CGM.    Hypertension: - Currently uncontrolled - Reviewed long term cardiovascular and renal outcomes of uncontrolled blood pressure - Reviewed appropriate blood pressure monitoring technique and reviewed goal blood pressure. Recommended to check home blood pressure and heart rate daily. Document and send to me in MyChart message next week - Recommend to continue current regimen. If BP remains uncontrolled, recommend to increase dose of losartan or switch to more potent ARB   Follow Up Plan: patient will respond to me via MyChart in 1 week  [name], PharmD, [place] [phone]

## 2022-05-16 DIAGNOSIS — Z419 Encounter for procedure for purposes other than remedying health state, unspecified: Secondary | ICD-10-CM | POA: Diagnosis not present

## 2022-05-18 ENCOUNTER — Inpatient Hospital Stay: Payer: Medicaid Other | Attending: Hematology

## 2022-05-18 DIAGNOSIS — E109 Type 1 diabetes mellitus without complications: Secondary | ICD-10-CM | POA: Insufficient documentation

## 2022-05-18 DIAGNOSIS — K5900 Constipation, unspecified: Secondary | ICD-10-CM | POA: Insufficient documentation

## 2022-05-18 DIAGNOSIS — Z79899 Other long term (current) drug therapy: Secondary | ICD-10-CM | POA: Insufficient documentation

## 2022-05-18 DIAGNOSIS — I10 Essential (primary) hypertension: Secondary | ICD-10-CM | POA: Insufficient documentation

## 2022-05-18 DIAGNOSIS — C9000 Multiple myeloma not having achieved remission: Secondary | ICD-10-CM | POA: Insufficient documentation

## 2022-05-19 ENCOUNTER — Other Ambulatory Visit: Payer: Self-pay

## 2022-05-20 NOTE — Progress Notes (Signed)
Per [facility] system, patient is due to revlimid refill. Multiple attempts have been made to reach the patient to arrange for lab work since the patient no showed appointment on 1/3 for labs. No refill can be sent until the patient has a negative urine pregnancy test. MD made aware.

## 2022-05-22 ENCOUNTER — Other Ambulatory Visit: Payer: Self-pay | Admitting: Nurse Practitioner

## 2022-05-23 ENCOUNTER — Inpatient Hospital Stay: Payer: Medicaid Other

## 2022-05-23 DIAGNOSIS — C9000 Multiple myeloma not having achieved remission: Secondary | ICD-10-CM | POA: Diagnosis not present

## 2022-05-23 DIAGNOSIS — Z79899 Other long term (current) drug therapy: Secondary | ICD-10-CM | POA: Diagnosis not present

## 2022-05-23 DIAGNOSIS — E109 Type 1 diabetes mellitus without complications: Secondary | ICD-10-CM | POA: Diagnosis not present

## 2022-05-23 DIAGNOSIS — K5900 Constipation, unspecified: Secondary | ICD-10-CM | POA: Diagnosis not present

## 2022-05-23 DIAGNOSIS — I10 Essential (primary) hypertension: Secondary | ICD-10-CM | POA: Diagnosis not present

## 2022-05-23 LAB — PREGNANCY, URINE: Preg Test, Ur: NEGATIVE

## 2022-05-24 ENCOUNTER — Other Ambulatory Visit: Payer: Self-pay

## 2022-05-24 MED ORDER — LENALIDOMIDE 10 MG PO CAPS: ORAL_CAPSULE | ORAL | 0 refills | Status: DC

## 2022-05-24 NOTE — Telephone Encounter (Signed)
Chart reviewed. Revlimid refilled per last office note with Dr. Katragadda.  

## 2022-05-25 ENCOUNTER — Inpatient Hospital Stay: Payer: Medicaid Other

## 2022-05-25 ENCOUNTER — Other Ambulatory Visit: Payer: Self-pay | Admitting: *Deleted

## 2022-05-25 ENCOUNTER — Ambulatory Visit: Payer: Medicaid Other | Admitting: Hematology

## 2022-05-25 VITALS — BP 149/111 | HR 79 | Temp 97.9°F | Resp 18

## 2022-05-25 DIAGNOSIS — I10 Essential (primary) hypertension: Secondary | ICD-10-CM

## 2022-05-25 DIAGNOSIS — C9000 Multiple myeloma not having achieved remission: Secondary | ICD-10-CM

## 2022-05-25 DIAGNOSIS — K5900 Constipation, unspecified: Secondary | ICD-10-CM | POA: Diagnosis not present

## 2022-05-25 DIAGNOSIS — E109 Type 1 diabetes mellitus without complications: Secondary | ICD-10-CM | POA: Diagnosis not present

## 2022-05-25 DIAGNOSIS — Z79899 Other long term (current) drug therapy: Secondary | ICD-10-CM | POA: Diagnosis not present

## 2022-05-25 LAB — CBC WITH DIFFERENTIAL/PLATELET
Abs Immature Granulocytes: 0.03 10*3/uL (ref 0.00–0.07)
Basophils Absolute: 0 10*3/uL (ref 0.0–0.1)
Basophils Relative: 1 %
Eosinophils Absolute: 0.1 10*3/uL (ref 0.0–0.5)
Eosinophils Relative: 3 %
HCT: 42 % (ref 36.0–46.0)
Hemoglobin: 13.6 g/dL (ref 12.0–15.0)
Immature Granulocytes: 1 %
Lymphocytes Relative: 41 %
Lymphs Abs: 1.6 10*3/uL (ref 0.7–4.0)
MCH: 27.1 pg (ref 26.0–34.0)
MCHC: 32.4 g/dL (ref 30.0–36.0)
MCV: 83.8 fL (ref 80.0–100.0)
Monocytes Absolute: 0.4 10*3/uL (ref 0.1–1.0)
Monocytes Relative: 10 %
Neutro Abs: 1.7 10*3/uL (ref 1.7–7.7)
Neutrophils Relative %: 44 %
Platelets: 311 10*3/uL (ref 150–400)
RBC: 5.01 MIL/uL (ref 3.87–5.11)
RDW: 14 % (ref 11.5–15.5)
WBC: 3.9 10*3/uL — ABNORMAL LOW (ref 4.0–10.5)
nRBC: 0 % (ref 0.0–0.2)

## 2022-05-25 LAB — COMPREHENSIVE METABOLIC PANEL
ALT: 53 U/L — ABNORMAL HIGH (ref 0–44)
AST: 33 U/L (ref 15–41)
Albumin: 3.9 g/dL (ref 3.5–5.0)
Alkaline Phosphatase: 76 U/L (ref 38–126)
Anion gap: 5 (ref 5–15)
BUN: 13 mg/dL (ref 6–20)
CO2: 25 mmol/L (ref 22–32)
Calcium: 9.5 mg/dL (ref 8.9–10.3)
Chloride: 104 mmol/L (ref 98–111)
Creatinine, Ser: 0.87 mg/dL (ref 0.44–1.00)
GFR, Estimated: >60 mL/min (ref >60)
Glucose, Bld: 197 mg/dL — ABNORMAL HIGH (ref 70–99)
Potassium: 3.6 mmol/L (ref 3.5–5.1)
Sodium: 134 mmol/L — ABNORMAL LOW (ref 135–145)
Total Bilirubin: 0.4 mg/dL (ref 0.3–1.2)
Total Protein: 7.9 g/dL (ref 6.5–8.1)

## 2022-05-25 LAB — PREGNANCY, URINE: Preg Test, Ur: NEGATIVE

## 2022-05-25 LAB — MAGNESIUM: Magnesium: 1.8 mg/dL (ref 1.7–2.4)

## 2022-05-25 MED ORDER — METOPROLOL TARTRATE 25 MG PO TABS: ORAL_TABLET | ORAL | 0 refills | Status: DC

## 2022-05-25 MED ORDER — ZOLEDRONIC ACID 4 MG/100ML IV SOLN
4.0000 mg | Freq: Once | INTRAVENOUS | Status: AC
  Administered 2022-05-25: 4 mg via INTRAVENOUS

## 2022-05-25 MED ORDER — SODIUM CHLORIDE 0.9 % IV SOLN: INTRAVENOUS | Status: DC

## 2022-05-25 MED ORDER — CLONIDINE HCL 0.1 MG PO TABS
0.2000 mg | ORAL_TABLET | Freq: Every day | ORAL | Status: DC
  Administered 2022-05-25: 0.2 mg via ORAL
  Filled 2022-05-25: qty 2

## 2022-05-25 MED ORDER — AMLODIPINE BESYLATE 5 MG PO TABS: 5.0000 mg | ORAL_TABLET | Freq: Every day | ORAL | 1 refills | Status: DC

## 2022-05-25 MED ORDER — LOSARTAN POTASSIUM 25 MG PO TABS: 25.0000 mg | ORAL_TABLET | Freq: Every day | ORAL | 0 refills | Status: DC

## 2022-05-25 NOTE — Patient Instructions (Signed)
[facility]  Discharge Instructions: Thank you for choosing [facility] to provide your oncology and hematology care.  If you have a lab appointment with the [facility], please come in thru the Main Entrance and check in at the main information desk.  Wear comfortable clothing and clothing appropriate for easy access to any Portacath or PICC line.   We strive to give you quality time with your provider. You may need to reschedule your appointment if you arrive late (15 or more minutes).  Arriving late affects you and other patients whose appointments are after yours.  Also, if you miss three or more appointments without notifying the office, you may be dismissed from the clinic at the provider's discretion.      For prescription refill requests, have your pharmacy contact our office and allow 72 hours for refills to be completed.    Today you received the following,Zometa and clonidine for bp,    To help prevent nausea and vomiting after your treatment, we encourage you to take your nausea medication as directed.  BELOW ARE SYMPTOMS THAT SHOULD BE REPORTED IMMEDIATELY: *FEVER GREATER THAN 100.4 F (38 C) OR HIGHER *CHILLS OR SWEATING *NAUSEA AND VOMITING THAT IS NOT CONTROLLED WITH YOUR NAUSEA MEDICATION *UNUSUAL SHORTNESS OF BREATH *UNUSUAL BRUISING OR BLEEDING *URINARY PROBLEMS (pain or burning when urinating, or frequent urination) *BOWEL PROBLEMS (unusual diarrhea, constipation, pain near the anus) TENDERNESS IN MOUTH AND THROAT WITH OR WITHOUT PRESENCE OF ULCERS (sore throat, sores in mouth, or a toothache) UNUSUAL RASH, SWELLING OR PAIN  UNUSUAL VAGINAL DISCHARGE OR ITCHING   Items with * indicate a potential emergency and should be followed up as soon as possible or go to the Emergency Department if any problems should occur.  Please show the CHEMOTHERAPY ALERT CARD or IMMUNOTHERAPY ALERT CARD at check-in to the Emergency Department and triage  nurse.  Should you have questions after your visit or need to cancel or reschedule your appointment, please contact [facility] [phone]  and follow the prompts.  Office hours are 8:00 a.m. to 4:30 p.m. Monday - Friday. Please note that voicemails left after 4:00 p.m. may not be returned until the following business day.  We are closed weekends and major holidays. You have access to a nurse at all times for urgent questions. Please call the main number to the clinic [phone] and follow the prompts.  For any non-urgent questions, you may also contact your provider using MyChart. We now offer e-Visits for anyone 19 and older to request care online for non-urgent symptoms. For details visit [email or web address].   Also download the MyChart app! Go to the app store, search "MyChart", open the app, select , and log in with your MyChart username and password.

## 2022-05-25 NOTE — Progress Notes (Signed)
Patient presented today for zometa , Blood pressure was elevated, MD notified and will give clonidine 0.'2mg'$  per MD order.   One hour after clonidine, Blood pressure 149/111. MD notified and ok to discharge per MD. Instructed patient to take her norvasc and losartin when she got home today and to take all three blood pressure medications starting tomorrow per MD.  discharged home from clinic ambulatory. Follow up as scheduled.

## 2022-05-26 LAB — KAPPA/LAMBDA LIGHT CHAINS
Kappa free light chain: 23.6 mg/L — ABNORMAL HIGH (ref 3.3–19.4)
Kappa, lambda light chain ratio: 1.54 (ref 0.26–1.65)
Lambda free light chains: 15.3 mg/L (ref 5.7–26.3)

## 2022-05-27 LAB — PROTEIN ELECTROPHORESIS, SERUM
A/G Ratio: 1.1 (ref 0.7–1.7)
Albumin ELP: 3.9 g/dL (ref 2.9–4.4)
Alpha-1-Globulin: 0.2 g/dL (ref 0.0–0.4)
Alpha-2-Globulin: 0.8 g/dL (ref 0.4–1.0)
Beta Globulin: 1.2 g/dL (ref 0.7–1.3)
Gamma Globulin: 1.3 g/dL (ref 0.4–1.8)
Globulin, Total: 3.5 g/dL (ref 2.2–3.9)
Total Protein ELP: 7.4 g/dL (ref 6.0–8.5)

## 2022-05-30 ENCOUNTER — Other Ambulatory Visit: Payer: Self-pay | Admitting: Nurse Practitioner

## 2022-05-30 LAB — IMMUNOFIXATION ELECTROPHORESIS
IgA: 145 mg/dL (ref 87–352)
IgG (Immunoglobin G), Serum: 1383 mg/dL (ref 586–1602)
IgM (Immunoglobulin M), Srm: 28 mg/dL (ref 26–217)
Total Protein ELP: 7.4 g/dL (ref 6.0–8.5)

## 2022-05-30 NOTE — Telephone Encounter (Signed)
Please advise 

## 2022-06-01 ENCOUNTER — Encounter: Payer: Self-pay | Admitting: Hematology

## 2022-06-01 ENCOUNTER — Inpatient Hospital Stay (HOSPITAL_BASED_OUTPATIENT_CLINIC_OR_DEPARTMENT_OTHER): Payer: Medicaid Other | Admitting: Hematology

## 2022-06-01 VITALS — BP 152/124 | HR 121 | Temp 98.0°F | Resp 18 | Wt 179.9 lb

## 2022-06-01 DIAGNOSIS — K5900 Constipation, unspecified: Secondary | ICD-10-CM | POA: Diagnosis not present

## 2022-06-01 DIAGNOSIS — Z79899 Other long term (current) drug therapy: Secondary | ICD-10-CM | POA: Diagnosis not present

## 2022-06-01 DIAGNOSIS — C9000 Multiple myeloma not having achieved remission: Secondary | ICD-10-CM | POA: Diagnosis not present

## 2022-06-01 DIAGNOSIS — E109 Type 1 diabetes mellitus without complications: Secondary | ICD-10-CM | POA: Diagnosis not present

## 2022-06-01 DIAGNOSIS — I10 Essential (primary) hypertension: Secondary | ICD-10-CM | POA: Diagnosis not present

## 2022-06-01 NOTE — Progress Notes (Signed)
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, a 32 y.o. female, seen for follow-up of multiple myeloma.  Energy levels are 75%.  She is continuing to take Revlimid 10 mg 3 weeks on 1 week off.  She forgot to take her blood pressure medication today.  She lost about 8 pounds since October last year and was trying to lose weight.  She reported some achy epigastric pain on and off for the last 3 days since she started back on Revlimid.  She was also constipated at the same time.  Taking laxative and having bowel movement helped the pain improve.  REVIEW OF SYSTEMS:  Review of Systems  Constitutional:  Positive for fatigue.  Gastrointestinal:  Positive for abdominal pain.  Neurological:  Negative for numbness.  All other systems reviewed and are negative.   PAST MEDICAL/SURGICAL HISTORY:  Past Medical History:  Diagnosis Date   Cancer ([facility]) 08/01/2020   multiple myeloma   DKA (diabetic ketoacidosis) ([facility]) 02/23/2020   Hypertension    Type 1 diabetes mellitus ([place]) 02/23/2020   Past Surgical History:  Procedure Laterality Date   BONE MARROW TRANSPLANT     NO PAST SURGERIES      SOCIAL HISTORY:  Social History   Socioeconomic History   Marital status: Married    Spouse name: Not on file   Number of children: Not on file   Years of education: Not on file   Highest education level: Not on file  Occupational History   Occupation: unemployed    Comment: can go back to work Jan 2023  Tobacco Use   Smoking status: Never   Smokeless tobacco: Never  Vaping Use   Vaping Use: Never used  Substance and Sexual Activity   Alcohol use: Not Currently    Comment:  occ- 0-1 drinks per week; maybe 1 drink every 6 months   Drug use: No   Sexual activity: Yes    Birth control/protection: Condom  Other Topics Concern   Not on file  Social History Narrative   Has 2 step-daughters   Social Determinants of Health   Financial Resource Strain: Low Risk  (04/25/2022)   Overall Financial Resource Strain (CARDIA)    Difficulty of Paying Living Expenses: Not hard at all  Food Insecurity: No Food Insecurity (04/07/2021)   Hunger Vital Sign    Worried About Running Out of Food in the Last Year: Never true    [place] in the Last Year: Never true  Transportation Needs: No Transportation Needs (04/25/2022)   PRAPARE - Hydrologist (Medical): No    Lack of Transportation (Non-Medical): No  Physical Activity: Sufficiently Active (06/08/2021)   Exercise Vital Sign    Days of Exercise per Week: 3 days    Minutes of Exercise per Session: 60 min  Stress: No Stress Concern Present (03/09/2020)   [place]    Feeling of Stress : Not at all  Social Connections: [place] (03/08/2021)   Social Connection and Isolation Panel [NHANES]    Frequency of Communication with Friends and Family: More than three  times a week    Frequency of Social Gatherings with Friends and Family: More than three times a week    Attends Religious Services: More than 4 times per year    Active Member of [employer] or Organizations: Yes    Attends Archivist Meetings: Never    Marital Status: Married  Human resources officer Violence: Not At Risk (03/09/2020)   Humiliation, Afraid, Rape, and Kick questionnaire    Fear of Current or Ex-Partner: No    Emotionally Abused: No    Physically Abused: No    Sexually Abused: No    FAMILY HISTORY:  Family History  Problem Relation Age of Onset   Hypertension Mother    Diabetes Mother    Hypertension Father    Diabetes Maternal  Grandmother     CURRENT MEDICATIONS:  Current Outpatient Medications  Medication Sig Dispense Refill   ACCU-CHEK GUIDE test strip Use to check blood glucose 4 times daily. 125 each 1   Accu-Chek Softclix Lancets lancets Use to check blood glucose 4 times daily. 200 each 0   amLODipine (NORVASC) 5 MG tablet Take 1 tablet (5 mg total) by mouth daily. (Patient not taking: Reported on 05/25/2022) 90 tablet 1   aspirin 325 MG tablet Take 1 tablet by mouth daily.     Blood Glucose Monitoring Suppl (ACCU-CHEK GUIDE) w/Device KIT      Blood Pressure Monitor KIT Use to check blood pressure as instructed 1 kit 0   calcium carbonate (OS-CAL) 1250 (500 Ca) MG chewable tablet Chew 1 tablet by mouth daily.     cetirizine (ZYRTEC) 10 MG tablet Take 1 tablet by mouth once daily 30 tablet 0   cholecalciferol (VITAMIN D3) 25 MCG (1000 UNIT) tablet Take 1,000 Units by mouth daily.     Continuous Blood Gluc Sensor (DEXCOM G6 SENSOR) MISC Change sensor every 10 days as directed 9 each 3   Continuous Blood Gluc Transmit (DEXCOM G6 TRANSMITTER) MISC Change transmitter every 90 days as directed. 1 each 3   fluticasone (FLONASE) 50 MCG/ACT nasal spray Place 2 sprays into both nostrils daily. 16 g 6   Insulin Glargine Solostar (LANTUS) 100 UNIT/ML Solostar Pen Inject 35 Units into the skin daily.     lenalidomide (REVLIMID) 10 MG capsule Take 1 capsule daily for 21 days, then take 7 days off 21 capsule 0   losartan (COZAAR) 25 MG tablet Take 1 tablet (25 mg total) by mouth daily. (Patient not taking: Reported on 05/25/2022) 30 tablet 0   metoprolol tartrate (LOPRESSOR) 25 MG tablet Take 1/2 (one-half) tablet by mouth twice daily (Patient not taking: Reported on 05/25/2022) 15 tablet 0   Multiple Vitamin (MULTIVITAMIN) tablet Take 1 tablet by mouth daily.     NOVOLOG FLEXPEN 100 UNIT/ML FlexPen INJECT 5 TO 11 UNITS SUBCUTANEOUSLY THREE TIMES DAILY WITH MEALS 15 mL 0   rosuvastatin (CRESTOR) 20 MG tablet Take 1 tablet by  mouth daily.     UNABLE TO FIND Blood pressure cuff DX I10 1 each 0   No current facility-administered medications for this visit.    ALLERGIES:  No Known Allergies  PHYSICAL EXAM:  Performance status (ECOG): 1 - Symptomatic but completely ambulatory  There were no vitals filed for this visit.  Wt Readings from Last 3 Encounters:  03/02/22 187 lb 12.8 oz (85.2 kg)  12/15/21 185 lb (83.9 kg)  12/01/21 186 lb 1.6 oz (84.4 kg)   Physical Exam Vitals reviewed.  Constitutional:  Appearance: Normal appearance. She is obese.  Cardiovascular:     Rate and Rhythm: Normal rate and regular rhythm.     Pulses: Normal pulses.     Heart sounds: Normal heart sounds.  Pulmonary:     Effort: Pulmonary effort is normal.     Breath sounds: Normal breath sounds.  Musculoskeletal:     Right lower leg: No edema.     Left lower leg: No edema.  Neurological:     General: No focal deficit present.     Mental Status: She is alert and oriented to person, place, and time.  Psychiatric:        Mood and Affect: Mood normal.        Behavior: Behavior normal.    LABORATORY DATA:  I have reviewed the labs as listed.     Latest Ref Rng & Units 05/25/2022    1:23 PM 02/22/2022    9:00 AM 11/24/2021    9:33 AM  CBC  WBC 4.0 - 10.5 K/uL 3.9  3.4  3.4   Hemoglobin 12.0 - 15.0 g/dL 13.6  13.6  13.6   Hematocrit 36.0 - 46.0 % 42.0  42.7  42.3   Platelets 150 - 400 K/uL 311  277  253       Latest Ref Rng & Units 05/25/2022    1:23 PM 02/22/2022    9:00 AM 11/24/2021    9:33 AM  CMP  Glucose 70 - 99 mg/dL 197  191  177   BUN 6 - 20 mg/dL '13  10  11   '$ Creatinine 0.44 - 1.00 mg/dL 0.87  0.72  0.63   Sodium 135 - 145 mmol/L 134  139  138   Potassium 3.5 - 5.1 mmol/L 3.6  3.5  3.7   Chloride 98 - 111 mmol/L 104  105  106   CO2 22 - 32 mmol/L '25  25  24   '$ Calcium 8.9 - 10.3 mg/dL 9.5  9.5  9.4   Total Protein 6.5 - 8.1 g/dL 7.9  8.1  8.0   Total Bilirubin 0.3 - 1.2 mg/dL 0.4  0.6  0.5    Alkaline Phos 38 - 126 U/L 76  67  72   AST 15 - 41 U/L 33  32  30   ALT 0 - 44 U/L 53  49  48       Component Value Date/Time   RBC 5.01 05/25/2022 1323   MCV 83.8 05/25/2022 1323   MCV 82 02/18/2021 0934   MCH 27.1 05/25/2022 1323   MCHC 32.4 05/25/2022 1323   RDW 14.0 05/25/2022 1323   RDW 13.2 02/18/2021 0934   LYMPHSABS 1.6 05/25/2022 1323   LYMPHSABS 1.1 02/18/2021 0934   MONOABS 0.4 05/25/2022 1323   EOSABS 0.1 05/25/2022 1323   EOSABS 0.5 (H) 02/18/2021 0934   BASOSABS 0.0 05/25/2022 1323   BASOSABS 0.0 02/18/2021 0934    DIAGNOSTIC IMAGING:  I have independently reviewed the scans and discussed with the patient. No results found.   ASSESSMENT:  1.  Stage III IgA kappa multiple myeloma: -Presentation to [facility] in [place] with confusion and weakness. -Found to have diabetic ketoacidosis with newly diagnosed type 1 diabetes. -Also found to have acute renal failure and hypercalcemia. -Bone marrow biopsy on 02/25/2020 with flow cytometry with the population CD138 positive, CD56 positive IgA kappa monoclonal plasma cells 12%.  Aspirate smears are hypocellular with hemodilution artifact and show markedly atypical plasmacytosis (80%) with  significant decrease in trilineage hematopoiesis. -Ultrasound abdomen on 02/23/2020 showed increased hepatic echogenicity with normal spleen. -Renal ultrasound on 02/24/2020 was normal. -CT chest on 02/22/2020 with moth eaten appearance of the bones. -Beta-2 microglobulin 2.1.  LDH 322.  SPEP and immunofixation were normal.  Kappa light chains are elevated at 36.8.  Lambda light chains 12.9 and ratio of 2.85. -24-hour urine immunofixation was negative.  M spike was negative. -PET scan on 03/23/2020 showed no FDG radiotracer activity, no soft tissue plasmacytoma.  Diffuse multiple small lytic lesions within the pelvis, spine and vertebral bodies consistent with myeloma. -Chromosome analysis was 54, XX.  Multiple  myeloma FISH panel was normal. -She is considered high risk based on elevated LDH level. -4 cycles of Dara VRD from 03/31/2020 through 06/12/2020. 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 - BMBX on 12/15/2020 with variably normocellular 30 to 70% with trilineage hematopoiesis.  No increase in plasma cells.  Myeloma FISH panel was normal.  Chromosome analysis was normal. - MRD results were negative. - Maintenance Revlimid 10 mg 3 weeks on/1 week off started around 12/08/2020.   2.  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  Non-smoker. -No family history of myeloma or other malignancies.   PLAN:  1.  Stage III IgA kappa multiple myeloma: - She is tolerating Revlimid very well. - Reviewed myeloma labs from 05/25/2022.  M spike is negative.  Free light chain ratio is 1.54.  Immunofixation shows normal. - She reported epigastric abdominal achy pain on and off for the last 3 days since she started back on her Revlimid.  She reported associated constipation since the start of Revlimid.  Bowel movement helped improve the pain. - She will continue Revlimid 10 mg 3 weeks on/1 week off.  She will call us if the pain gets any worse. - Reviewed labs today which showed normal CBC with mild leukopenia.  ALT is mildly elevated at 53 and rest of LFTs are normal. - RTC 12 weeks for follow-up with repeat myeloma labs.   2.  Type I diabetes: - Continue Lantus 35 units at bedtime and NovoLog sliding scale.   3.  Hypertension: - She forgot to take her blood pressure medication today.  She will continue losartan, metoprolol and Norvasc daily.   4.  Bone protection:  - Calcium is 9.5.  Continue calcium supplements. - Continue Zometa today and every 12 weeks.  Orders placed this encounter:  No orders of the defined types were placed in this encounter.    [name], MD [facility] [phone]   I, Thana Ates, am acting as a scribe for Dr. [name].  I, [name] MD, have reviewed the above documentation for accuracy and completeness, and I agree with the above.

## 2022-06-01 NOTE — Progress Notes (Signed)
Patient is taking Revlimid as prescribed. She has not missed any doses and reports no known side effects at this time.

## 2022-06-01 NOTE — Patient Instructions (Addendum)
Center  Discharge Instructions  You were seen and examined today by Dr. Delton Coombes.  Dr. Delton Coombes has reviewed your lab work, which is stable. Please continue your Revlimid as prescribed.  Revlimid can cause constipation which can result in abdominal discomfort. Take a daily stool softener to prevent constipation.  Follow-up as scheduled.  Thank you for choosing [facility] to provide your oncology and hematology care.   To afford each patient quality time with our provider, please arrive at least 15 minutes before your scheduled appointment time. You may need to reschedule your appointment if you arrive late (10 or more minutes). Arriving late affects you and other patients whose appointments are after yours.  Also, if you miss three or more appointments without notifying the office, you may be dismissed from the clinic at the provider's discretion.    Again, thank you for choosing [facility].  Our hope is that these requests will decrease the amount of time that you wait before being seen by our physicians.   If you have a lab appointment with the [facility] please come in thru the Main Entrance and check in at the main information desk.           _____________________________________________________________  Should you have questions after your visit to [facility], please contact our office at [phone] and follow the prompts.  Our office hours are 8:00 a.m. to 4:30 p.m. Monday - Thursday and 8:00 a.m. to 2:30 p.m. Friday.  Please note that voicemails left after 4:00 p.m. may not be returned until the following business day.  We are closed weekends and all major holidays.  You do have access to a nurse 24-7, just call the main number to the clinic [phone] and do not press any options, hold on the line and a nurse will answer the phone.    For prescription refill requests, have your  pharmacy contact our office and allow 72 hours.    Masks are optional in the cancer centers. If you would like for your care team to wear a mask while they are taking care of you, please let them know. You may have one support person who is at least 32 years old accompany you for your appointments.

## 2022-06-14 ENCOUNTER — Ambulatory Visit: Payer: Medicaid Other | Admitting: Nurse Practitioner

## 2022-06-16 DIAGNOSIS — Z419 Encounter for procedure for purposes other than remedying health state, unspecified: Secondary | ICD-10-CM | POA: Diagnosis not present

## 2022-06-18 ENCOUNTER — Other Ambulatory Visit: Payer: Self-pay | Admitting: Hematology

## 2022-06-20 ENCOUNTER — Inpatient Hospital Stay: Payer: Medicaid Other | Attending: Hematology

## 2022-06-20 ENCOUNTER — Encounter (HOSPITAL_COMMUNITY): Payer: Self-pay | Admitting: Hematology

## 2022-06-20 DIAGNOSIS — C9000 Multiple myeloma not having achieved remission: Secondary | ICD-10-CM | POA: Insufficient documentation

## 2022-06-20 LAB — PREGNANCY, URINE: Preg Test, Ur: NEGATIVE

## 2022-06-22 ENCOUNTER — Other Ambulatory Visit: Payer: Self-pay

## 2022-06-22 MED ORDER — LENALIDOMIDE 10 MG PO CAPS: ORAL_CAPSULE | ORAL | 0 refills | Status: DC

## 2022-06-22 NOTE — Telephone Encounter (Signed)
Chart reviewed. Revlimid refilled per last office note with Dr. Katragadda.  

## 2022-06-26 ENCOUNTER — Other Ambulatory Visit: Payer: Self-pay | Admitting: Hematology

## 2022-06-26 DIAGNOSIS — I10 Essential (primary) hypertension: Secondary | ICD-10-CM

## 2022-06-30 ENCOUNTER — Ambulatory Visit (INDEPENDENT_AMBULATORY_CARE_PROVIDER_SITE_OTHER): Payer: Medicaid Other | Admitting: Family Medicine

## 2022-06-30 ENCOUNTER — Encounter: Payer: Self-pay | Admitting: Family Medicine

## 2022-06-30 VITALS — BP 124/85 | HR 78 | Temp 98.2°F | Ht 63.0 in | Wt 184.1 lb

## 2022-06-30 DIAGNOSIS — Z0001 Encounter for general adult medical examination with abnormal findings: Secondary | ICD-10-CM | POA: Diagnosis not present

## 2022-06-30 DIAGNOSIS — E1010 Type 1 diabetes mellitus with ketoacidosis without coma: Secondary | ICD-10-CM | POA: Diagnosis not present

## 2022-06-30 DIAGNOSIS — E6609 Other obesity due to excess calories: Secondary | ICD-10-CM | POA: Diagnosis not present

## 2022-06-30 DIAGNOSIS — E785 Hyperlipidemia, unspecified: Secondary | ICD-10-CM

## 2022-06-30 DIAGNOSIS — E1059 Type 1 diabetes mellitus with other circulatory complications: Secondary | ICD-10-CM

## 2022-06-30 DIAGNOSIS — Z0000 Encounter for general adult medical examination without abnormal findings: Secondary | ICD-10-CM

## 2022-06-30 DIAGNOSIS — Z6832 Body mass index (BMI) 32.0-32.9, adult: Secondary | ICD-10-CM

## 2022-06-30 DIAGNOSIS — E1069 Type 1 diabetes mellitus with other specified complication: Secondary | ICD-10-CM

## 2022-06-30 DIAGNOSIS — I152 Hypertension secondary to endocrine disorders: Secondary | ICD-10-CM

## 2022-06-30 DIAGNOSIS — C9000 Multiple myeloma not having achieved remission: Secondary | ICD-10-CM | POA: Diagnosis not present

## 2022-06-30 DIAGNOSIS — Z7689 Persons encountering health services in other specified circumstances: Secondary | ICD-10-CM | POA: Diagnosis not present

## 2022-06-30 NOTE — Progress Notes (Signed)
Complete physical exam  Patient: Diamond Robbins   DOB: 1990-09-18   32 y.o. Female  MRN: [id number]  Subjective:    Chief Complaint  Patient presents with   Annual Exam   Diamond Robbins is here to establish care with a new PCP. She was previously seen at [facility].  Diamond Robbins is a 32 y.o. female who presents today for a complete physical exam. She reports consuming a  diabetic  diet. The patient does not participate in regular exercise at present. She generally feels fairly well. She reports sleeping fairly well. She does not have additional problems to discuss today.   She has type 1 DM. This is managed by endocrinology in [place] with [facility]. She has a follow up visit in a few weeks. She is on lantus and novolog sliding scale. She has a dexcom.   She is established with oncology for management of multiple myeloma. She is on lenaliodomide for this.   She is on losartan, metoprolol, and amlodipine for HTN.   She is on crestor for HLD.   Most recent fall risk assessment:    06/30/2022   11:24 AM  Fall Risk   Falls in the past year? 0     Most recent depression screenings:    06/30/2022   11:24 AM 09/09/2021   10:53 AM  PHQ 2/9 Scores  PHQ - 2 Score 0 0  PHQ- 9 Score 0     Vision:Within last year and Dental: No current dental problems and Receives regular dental care  Past Medical History:  Diagnosis Date   Cancer ([place]) 08/01/2020   multiple myeloma   DKA (diabetic ketoacidosis) ([place]) 02/23/2020   Hypertension    Type 1 diabetes mellitus ([place]) 02/23/2020      Patient Care Team: Gwenlyn Perking, FNP as PCP - General (Family Medicine) Brien Mates, RN as Oncology Nurse Navigator (Oncology) [name], MD as Medical Oncologist (Oncology) Osker Mason, RPH-CPP (Pharmacist) Brita Romp, NP as Nurse Practitioner (Endocrinology)   Outpatient Medications Prior to Visit  Medication Sig   ACCU-CHEK GUIDE test strip Use to  check blood glucose 4 times daily.   Accu-Chek Softclix Lancets lancets Use to check blood glucose 4 times daily.   amLODipine (NORVASC) 5 MG tablet Take 1 tablet (5 mg total) by mouth daily.   aspirin 325 MG tablet Take 1 tablet by mouth daily.   Blood Glucose Monitoring Suppl (ACCU-CHEK GUIDE) w/Device KIT    Blood Pressure Monitor KIT Use to check blood pressure as instructed   calcium carbonate (OS-CAL) 1250 (500 Ca) MG chewable tablet Chew 1 tablet by mouth daily.   cetirizine (ZYRTEC) 10 MG tablet Take 1 tablet by mouth once daily   cholecalciferol (VITAMIN D3) 25 MCG (1000 UNIT) tablet Take 1,000 Units by mouth daily.   Continuous Blood Gluc Sensor (DEXCOM G6 SENSOR) MISC Change sensor every 10 days as directed   Continuous Blood Gluc Transmit (DEXCOM G6 TRANSMITTER) MISC Change transmitter every 90 days as directed.   fluticasone (FLONASE) 50 MCG/ACT nasal spray Place 2 sprays into both nostrils daily.   Insulin Glargine Solostar (LANTUS) 100 UNIT/ML Solostar Pen Inject 35 Units into the skin daily.   lenalidomide (REVLIMID) 10 MG capsule Take 1 capsule daily for 21 days, then take 7 days off   losartan (COZAAR) 25 MG tablet Take 1 tablet by mouth once daily   metoprolol tartrate (LOPRESSOR) 25 MG tablet Take 1/2 (one-half) tablet by mouth twice daily  Multiple Vitamin (MULTIVITAMIN) tablet Take 1 tablet by mouth daily.   NOVOLOG FLEXPEN 100 UNIT/ML FlexPen INJECT 5 TO 11 UNITS SUBCUTANEOUSLY THREE TIMES DAILY WITH MEALS   rosuvastatin (CRESTOR) 20 MG tablet Take 1 tablet by mouth daily.   [DISCONTINUED] UNABLE TO FIND Blood pressure cuff DX I10   No facility-administered medications prior to visit.    Review of Systems  Constitutional:  Negative for chills and fever.  Eyes:  Negative for blurred vision and double vision.  Respiratory:  Negative for shortness of breath and wheezing.   Cardiovascular:  Negative for chest pain, palpitations, orthopnea, claudication, leg swelling  and PND.  Gastrointestinal:  Negative for nausea and vomiting.  Genitourinary:  Negative for dysuria, frequency and urgency.  Neurological:  Negative for dizziness, tingling, tremors, focal weakness, seizures and loss of consciousness.  Psychiatric/Behavioral:  Negative for depression. The patient is not nervous/anxious and does not have insomnia.           Objective:     BP 124/85   Pulse 78   Temp 98.2 F (36.8 C) (Temporal)   Ht 5' 3"$  (1.6 m)   Wt 184 lb 2 oz (83.5 kg)   SpO2 99%   BMI 32.62 kg/m    Physical Exam Vitals and nursing note reviewed.  Constitutional:      General: She is not in acute distress.    Appearance: Normal appearance. She is not ill-appearing.  HENT:     Head: Normocephalic.     Right Ear: Tympanic membrane, ear canal and external ear normal.     Left Ear: Tympanic membrane, ear canal and external ear normal.     Nose: Nose normal.     Mouth/Throat:     Mouth: Mucous membranes are dry.     Pharynx: Oropharynx is clear.  Eyes:     Extraocular Movements: Extraocular movements intact.     Conjunctiva/sclera: Conjunctivae normal.     Pupils: Pupils are equal, round, and reactive to light.  Neck:     Thyroid: No thyroid mass, thyromegaly or thyroid tenderness.     Vascular: No carotid bruit.  Cardiovascular:     Rate and Rhythm: Normal rate and regular rhythm.     Pulses: Normal pulses.     Heart sounds: Normal heart sounds. No murmur heard.    No friction rub. No gallop.  Pulmonary:     Effort: Pulmonary effort is normal.     Breath sounds: Normal breath sounds.  Abdominal:     General: Bowel sounds are normal. There is no distension.     Palpations: Abdomen is soft. There is no mass.     Tenderness: There is no abdominal tenderness. There is no guarding.  Musculoskeletal:     Cervical back: Neck supple. No rigidity.     Right lower leg: No edema.     Left lower leg: No edema.  Skin:    General: Skin is warm and dry.     Capillary  Refill: Capillary refill takes less than 2 seconds.     Findings: No lesion or rash.  Neurological:     General: No focal deficit present.     Mental Status: She is alert and oriented to person, place, and time.     Cranial Nerves: No cranial nerve deficit.     Motor: No weakness.     Gait: Gait normal.  Psychiatric:        Mood and Affect: Mood normal.  Behavior: Behavior normal.        Thought Content: Thought content normal.        Judgment: Judgment normal.      No results found for any visits on 06/30/22.     Assessment & Plan:    Routine Health Maintenance and Physical Exam  Diamond Robbins was seen today for annual exam.  Diagnoses and all orders for this visit:  Routine general medical examination at a health care facility She will return for fasting labs. Reviewed CBC, CMP from 05/25/22. -     Lipid panel -     TSH; Future  Type 1 diabetes mellitus with ketoacidosis without coma ([place]) Managed by endocrinology.   Hyperlipidemia due to type 1 diabetes mellitus (HCC) Fasting lipid panel pending. Continue crestor.  -     Lipid panel  Hypertension associated with type 1 diabetes mellitus ([place]) Well controlled on current regimen. Continue amlodipine, metoprolol, losartan.   Class 1 obesity due to excess calories with serious comorbidity and body mass index (BMI) of 32.0 to 32.9 in adult Diet and exercise.   Multiple myeloma not having achieved remission ([place]) Managed by oncology.   Immunization History  Administered Date(s) Administered   DTaP / HiB / IPV 03/24/2021   DTaP / IPV 07/16/2021, 09/16/2021   HIB (PRP-T) 07/16/2021, 09/16/2021   Hepatitis B 03/24/2021   Hepatitis B, ADULT 03/24/2021, 07/16/2021, 12/01/2021   Influenza,inj,Quad PF,6+ Mos 03/16/2021   Pneumococcal Conjugate-13 03/24/2021, 07/16/2021, 09/16/2021   Pneumococcal Polysaccharide-23 12/01/2021    Health Maintenance  Topic Date Due   HEMOGLOBIN A1C  12/19/2021   FOOT EXAM  02/18/2022    Diabetic kidney evaluation - Urine ACR  06/24/2022   OPHTHALMOLOGY EXAM  05/31/2022   INFLUENZA VACCINE  01/13/2023 (Originally 12/14/2021)   COVID-19 Vaccine (1) 07/16/2023 (Originally 01/18/1996)   Diabetic kidney evaluation - eGFR measurement  05/26/2023   PAP SMEAR-Modifier  02/19/2024   DTaP/Tdap/Td (4 - Tdap) 09/17/2031   Hepatitis C Screening  Completed   HIV Screening  Completed   HPV VACCINES  Aged Out    Discussed health benefits of physical activity, and encouraged her to engage in regular exercise appropriate for her age and condition.  Problem List Items Addressed This Visit       Cardiovascular and Mediastinum   Hypertension associated with type 1 diabetes mellitus ([place])     Endocrine   T1DM (type 1 diabetes mellitus) ([place])   Relevant Orders   Lipid panel   Hyperlipidemia due to type 1 diabetes mellitus ([place])     Other   Multiple myeloma (HCC)   Obesity (BMI 30-39.9)   Other Visit Diagnoses     Routine general medical examination at a health care facility    -  Primary   Relevant Orders   Lipid panel   TSH      Return in about 6 months (around 12/29/2022) for chronic follow up.   The patient indicates understanding of these issues and agrees with the plan.  Gwenlyn Perking, FNP

## 2022-06-30 NOTE — Patient Instructions (Signed)

## 2022-07-10 ENCOUNTER — Other Ambulatory Visit: Payer: Self-pay | Admitting: Hematology

## 2022-07-11 ENCOUNTER — Encounter (HOSPITAL_COMMUNITY): Payer: Self-pay | Admitting: Hematology

## 2022-07-13 ENCOUNTER — Other Ambulatory Visit: Payer: Self-pay | Admitting: Nurse Practitioner

## 2022-07-15 DIAGNOSIS — Z419 Encounter for procedure for purposes other than remedying health state, unspecified: Secondary | ICD-10-CM | POA: Diagnosis not present

## 2022-07-19 ENCOUNTER — Ambulatory Visit (INDEPENDENT_AMBULATORY_CARE_PROVIDER_SITE_OTHER): Payer: Medicaid Other | Admitting: Nurse Practitioner

## 2022-07-19 ENCOUNTER — Encounter: Payer: Self-pay | Admitting: Nurse Practitioner

## 2022-07-19 VITALS — BP 131/85 | HR 81 | Ht 63.0 in | Wt 184.2 lb

## 2022-07-19 DIAGNOSIS — E1065 Type 1 diabetes mellitus with hyperglycemia: Secondary | ICD-10-CM | POA: Diagnosis not present

## 2022-07-19 DIAGNOSIS — I10 Essential (primary) hypertension: Secondary | ICD-10-CM

## 2022-07-19 DIAGNOSIS — E782 Mixed hyperlipidemia: Secondary | ICD-10-CM | POA: Diagnosis not present

## 2022-07-19 LAB — POCT GLYCOSYLATED HEMOGLOBIN (HGB A1C): Hemoglobin A1C: 8.2 % — AB (ref 4.0–5.6)

## 2022-07-19 LAB — POCT UA - MICROALBUMIN: Albumin/Creatinine Ratio, Urine, POC: >300

## 2022-07-19 MED ORDER — NOVOLOG FLEXPEN RELION 100 UNIT/ML ~~LOC~~ SOPN: 6.0000 [IU] | PEN_INJECTOR | Freq: Three times a day (TID) | SUBCUTANEOUS | 3 refills | Status: DC

## 2022-07-19 NOTE — Progress Notes (Signed)
Endocrinology Follow Up Note       07/19/2022, 8:31 AM   Subjective:    Patient ID: Diamond Robbins, female    DOB: 1990-06-25.  Diamond Robbins is being seen in follow up after being seen in consultation for management of currently uncontrolled symptomatic diabetes requested by  Gwenlyn Perking, FNP.   Past Medical History:  Diagnosis Date   Cancer ([place]) 08/01/2020   multiple myeloma   DKA (diabetic ketoacidosis) ([place]) 02/23/2020   Hypertension    Type 1 diabetes mellitus ([place]) 02/23/2020    Past Surgical History:  Procedure Laterality Date   BONE MARROW TRANSPLANT     NO PAST SURGERIES      Social History   Socioeconomic History   Marital status: Married    Spouse name: Cindee Salt   Number of children: Not on file   Years of education: 12   Highest education level: High school graduate  Occupational History   Occupation: unemployed    Comment: can go back to work Jan 2023  Tobacco Use   Smoking status: Never   Smokeless tobacco: Never  Vaping Use   Vaping Use: Never used  Substance and Sexual Activity   Alcohol use: Not Currently    Comment: occ- 0-1 drinks per week; maybe 1 drink every 6 months   Drug use: No   Sexual activity: Yes    Birth control/protection: Condom  Other Topics Concern   Not on file  Social History Narrative   Has 2 step-daughters   Social Determinants of Health   Financial Resource Strain: Low Risk  (04/25/2022)   Overall Financial Resource Strain (CARDIA)    Difficulty of Paying Living Expenses: Not hard at all  Food Insecurity: No Food Insecurity (04/07/2021)   Hunger Vital Sign    Worried About Running Out of Food in the Last Year: Never true    [place] in the Last Year: Never true  Transportation Needs: No Transportation Needs (04/25/2022)   PRAPARE - Hydrologist (Medical): No    Lack of Transportation (Non-Medical): No   Physical Activity: Sufficiently Active (06/08/2021)   Exercise Vital Sign    Days of Exercise per Week: 3 days    Minutes of Exercise per Session: 60 min  Stress: No Stress Concern Present (03/09/2020)   [place]    Feeling of Stress : Not at all  Social Connections: [place] (03/08/2021)   Social Connection and Isolation Panel [NHANES]    Frequency of Communication with Friends and Family: More than three times a week    Frequency of Social Gatherings with Friends and Family: More than three times a week    Attends Religious Services: More than 4 times per year    Active Member of [employer] or Organizations: Yes    Attends Archivist Meetings: Never    Marital Status: Married    Family History  Problem Relation Age of Onset   Hypertension Mother    Diabetes Father    Hypertension Father    Stroke Maternal Grandmother    Hypertension Maternal Grandmother    Diabetes Maternal  Grandmother    Hypertension Maternal Grandfather    Heart attack Maternal Grandfather    Hypertension Paternal Grandmother    Hypertension Paternal Grandfather     Outpatient Encounter Medications as of 07/19/2022  Medication Sig   ACCU-CHEK GUIDE test strip Use to check blood glucose 4 times daily.   Accu-Chek Softclix Lancets lancets Use to check blood glucose 4 times daily.   amLODipine (NORVASC) 5 MG tablet Take 1 tablet (5 mg total) by mouth daily.   aspirin 325 MG tablet Take 1 tablet by mouth daily.   Blood Glucose Monitoring Suppl (ACCU-CHEK GUIDE) w/Device KIT    Blood Pressure Monitor KIT Use to check blood pressure as instructed   calcium carbonate (OS-CAL) 1250 (500 Ca) MG chewable tablet Chew 1 tablet by mouth daily.   cetirizine (ZYRTEC) 10 MG tablet Take 1 tablet by mouth once daily   cholecalciferol (VITAMIN D3) 25 MCG (1000 UNIT) tablet Take 1,000 Units by mouth daily.   Continuous Blood Gluc Sensor  (DEXCOM G6 SENSOR) MISC CHANGE SENSOR EVERY 10 DAYS AS DIRECTED   Continuous Blood Gluc Transmit (DEXCOM G6 TRANSMITTER) MISC Change transmitter every 90 days as directed.   fluticasone (FLONASE) 50 MCG/ACT nasal spray Place 2 sprays into both nostrils daily.   Insulin Glargine Solostar (LANTUS) 100 UNIT/ML Solostar Pen Inject 35 Units into the skin daily.   lenalidomide (REVLIMID) 10 MG capsule Take 1 capsule daily for 21 days, then take 7 days off   losartan (COZAAR) 25 MG tablet Take 1 tablet by mouth once daily   metoprolol tartrate (LOPRESSOR) 25 MG tablet Take 1/2 (one-half) tablet by mouth twice daily   Multiple Vitamin (MULTIVITAMIN) tablet Take 1 tablet by mouth daily.   rosuvastatin (CRESTOR) 20 MG tablet Take 1 tablet by mouth daily.   [DISCONTINUED] NOVOLOG FLEXPEN 100 UNIT/ML FlexPen INJECT 5 TO 11 UNITS SUBCUTANEOUSLY THREE TIMES DAILY WITH MEALS   insulin aspart (NOVOLOG FLEXPEN) 100 UNIT/ML FlexPen Inject 6-12 Units into the skin 3 (three) times daily with meals.   No facility-administered encounter medications on file as of 07/19/2022.    ALLERGIES: No Known Allergies  VACCINATION STATUS: Immunization History  Administered Date(s) Administered   DTaP / HiB / IPV 03/24/2021   DTaP / IPV 07/16/2021, 09/16/2021   HIB (PRP-T) 07/16/2021, 09/16/2021   Hepatitis B 03/24/2021   Hepatitis B, ADULT 03/24/2021, 07/16/2021, 12/01/2021   Influenza,inj,Quad PF,6+ Mos 03/16/2021   Pneumococcal Conjugate-13 03/24/2021, 07/16/2021, 09/16/2021   Pneumococcal Polysaccharide-23 12/01/2021    Diabetes She presents for her follow-up diabetic visit. She has type 1 diabetes mellitus. Onset time: Diagnosed at approx age of 74. Her disease course has been improving. There are no hypoglycemic associated symptoms. Associated symptoms include blurred vision and fatigue. Pertinent negatives for diabetes include no polydipsia, no polyuria and no weight loss. There are no hypoglycemic complications.  Symptoms are improving. There are no diabetic complications. Risk factors for coronary artery disease include diabetes mellitus, dyslipidemia, hypertension and sedentary lifestyle. Current diabetic treatment includes intensive insulin program. She is compliant with treatment most of the time. Her weight is fluctuating minimally. She is following a generally healthy diet. When asked about meal planning, she reported none. She has not had a previous visit with a dietitian. She participates in exercise intermittently. Her home blood glucose trend is fluctuating minimally. Her breakfast blood glucose range is generally 110-130 mg/dl. Her overall blood glucose range is 140-180 mg/dl. (She presents today after long absence with her CGM showing mostly at target  glycemic profile.  Her POCT A1c today is 8.2%, improving from last A1c of 10%.  Analysis of her CGM shows TIR 55%, TAR 45%, TBR 0% with a GMI of 7.6%.  She denies any significant hypoglycemia.  She also notes she tends to sleep late and therefore eats later in the evening causing higher spikes at that time.) An ACE inhibitor/angiotensin II receptor blocker is not being taken. She does not see a podiatrist.Eye exam is current.  Hypertension This is a chronic problem. The current episode started more than 1 year ago. The problem has been resolved since onset. The problem is controlled. Associated symptoms include blurred vision. There are no associated agents to hypertension. Risk factors for coronary artery disease include diabetes mellitus, dyslipidemia, family history and sedentary lifestyle. Past treatments include beta blockers and calcium channel blockers. The current treatment provides moderate improvement. Compliance problems include exercise and diet.   Hyperlipidemia This is a chronic problem. The current episode started more than 1 year ago. The problem is controlled. Recent lipid tests were reviewed and are normal. Exacerbating diseases include  diabetes and obesity. Factors aggravating her hyperlipidemia include beta blockers and fatty foods. Current antihyperlipidemic treatment includes statins. The current treatment provides moderate improvement of lipids. Compliance problems include adherence to diet and adherence to exercise.  Risk factors for coronary artery disease include diabetes mellitus, dyslipidemia, family history, female sex, hypertension and a sedentary lifestyle.    Review of systems  Constitutional: + Minimally fluctuating body weight,  current Body mass index is 32.63 kg/m. , no fatigue, no subjective hyperthermia, no subjective hypothermia Eyes: no blurry vision, no xerophthalmia ENT: no sore throat, no nodules palpated in throat, no dysphagia/odynophagia, no hoarseness Cardiovascular: no chest pain, no shortness of breath, no palpitations, no leg swelling Respiratory: no cough, no shortness of breath Gastrointestinal: no nausea/vomiting/diarrhea Musculoskeletal: no muscle/joint aches Skin: no rashes, no hyperemia Neurological: no tremors, no numbness, no tingling, no dizziness Psychiatric: no depression, no anxiety  Objective:     BP 131/85 (BP Location: Left Arm, Patient Position: Sitting, Cuff Size: Normal)   Pulse 81   Ht '5\' 3"'$  (1.6 m)   Wt 184 lb 3.2 oz (83.6 kg)   BMI 32.63 kg/m   Wt Readings from Last 3 Encounters:  07/19/22 184 lb 3.2 oz (83.6 kg)  06/30/22 184 lb 2 oz (83.5 kg)  06/01/22 179 lb 14.3 oz (81.6 kg)     BP Readings from Last 3 Encounters:  07/19/22 131/85  06/30/22 124/85  06/01/22 (!) 152/124     Physical Exam- Limited  Constitutional:  Body mass index is 32.63 kg/m. , not in acute distress, normal state of mind Eyes:  EOMI, no exophthalmos Musculoskeletal: no gross deformities, strength intact in all four extremities, no gross restriction of joint movements Skin:  no rashes, no hyperemia Neurological: no tremor with outstretched hands   Diabetic Foot Exam - Simple    Simple Foot Form Diabetic Foot exam was performed with the following findings: Yes 07/19/2022  8:27 AM  Visual Inspection No deformities, no ulcerations, no other skin breakdown bilaterally: Yes Sensation Testing Intact to touch and monofilament testing bilaterally: Yes Pulse Check Posterior Tibialis and Dorsalis pulse intact bilaterally: Yes Comments     CMP ( most recent) CMP     Component Value Date/Time   NA 134 (L) 05/25/2022 1323   NA 138 02/18/2021 0934   K 3.6 05/25/2022 1323   CL 104 05/25/2022 1323   CO2 25 05/25/2022 1323  GLUCOSE 197 (H) 05/25/2022 1323   BUN 13 05/25/2022 1323   BUN 11 02/18/2021 0934   CREATININE 0.87 05/25/2022 1323   CALCIUM 9.5 05/25/2022 1323   PROT 7.9 05/25/2022 1323   PROT 7.7 02/18/2021 0934   ALBUMIN 3.9 05/25/2022 1323   ALBUMIN 5.2 (H) 02/18/2021 0934   AST 33 05/25/2022 1323   ALT 53 (H) 05/25/2022 1323   ALKPHOS 76 05/25/2022 1323   BILITOT 0.4 05/25/2022 1323   BILITOT 0.3 02/18/2021 0934   GFRNONAA >60 05/25/2022 1323     Diabetic Labs (most recent): Lab Results  Component Value Date   HGBA1C 8.2 (A) 07/19/2022   HGBA1C 10.0 (H) 06/21/2021   HGBA1C 7.0 (H) 02/18/2021   MICROALBUR '150mg'$ /L 07/19/2022   MICROALBUR 150 06/24/2021     Lipid Panel ( most recent) Lipid Panel     Component Value Date/Time   CHOL 158 06/21/2021 1011   TRIG 125 06/21/2021 1011   HDL 62 06/21/2021 1011   CHOLHDL 2.5 06/21/2021 1011   LDLCALC 74 06/21/2021 1011   LABVLDL 22 06/21/2021 1011      Lab Results  Component Value Date   TSH 2.430 02/18/2021           Assessment & Plan:   1) Type 1 diabetes mellitus without complications ([place])  She presents today after long absence with her CGM showing mostly at target glycemic profile.  Her POCT A1c today is 8.2%, improving from last A1c of 10%.  Analysis of her CGM shows TIR 55%, TAR 45%, TBR 0% with a GMI of 7.6%.  She denies any significant hypoglycemia.  She also notes she  tends to sleep late and therefore eats later in the evening causing higher spikes at that time.  - Diamond Robbins has currently uncontrolled symptomatic type 2 DM since 32 years of age.   -Recent labs reviewed.  POCT UM shows mild microalbuminuria at 150.  Recent kidney function normal.  Will recheck CMP prior to next visit.  She is on ARB for renal protection.  - I had a long discussion with her about the progressive nature of diabetes and the pathology behind its complications. -her diabetes is not currently complicated but she remains at a high risk for more acute and chronic complications which include CAD, CVA, CKD, retinopathy, and neuropathy. These are all discussed in detail with her.  The following Lifestyle Medicine recommendations according to [facility]) were discussed and offered to patient and she agrees to start the journey:  A. Whole Foods, Plant-based plate comprising of fruits and vegetables, plant-based proteins, whole-grain carbohydrates was discussed in detail with the patient.   A list for source of those nutrients were also provided to the patient.  Patient will use only water or unsweetened tea for hydration. B.  The need to stay away from risky substances including alcohol, smoking; obtaining 7 to 9 hours of restorative sleep, at least 150 minutes of moderate intensity exercise weekly, the importance of healthy social connections,  and stress reduction techniques were discussed. C.  A full color page of  Calorie density of various food groups per pound showing examples of each food groups was provided to the patient.  - Nutritional counseling repeated at each appointment due to patients tendency to fall back in to old habits.  - The patient admits there is a room for improvement in their diet and drink choices. -  Suggestion is made for the patient to avoid simple carbohydrates  from their diet including Cakes, Sweet Desserts / Pastries, Ice  Cream, Soda (diet and regular), Sweet Tea, Candies, Chips, Cookies, Sweet Pastries, Store Bought Juices, Alcohol in Excess of 1-2 drinks a day, Artificial Sweeteners, Coffee Creamer, and "Sugar-free" Products. This will help patient to have stable blood glucose profile and potentially avoid unintended weight gain.   - I encouraged the patient to switch to unprocessed or minimally processed complex starch and increased protein intake (animal or plant source), fruits, and vegetables.   - Patient is advised to stick to a routine mealtimes to eat 3 meals a day and avoid unnecessary snacks (to snack only to correct hypoglycemia).  - she will be scheduled with Jearld Fenton, RDN, CDE for diabetes education.-- sees her after her appt with me today.  - I have approached her with the following individualized plan to manage her diabetes and patient agrees:   - She is advised to continue her Lantus 35 units SQ nightly and increase her Novolog to 6-12 units TID with meals if glucose is above 90 and she is eating (Specific instructions on how to titrate insulin dosage based on glucose readings given to patient in writing).   -she is encouraged to continue monitoring glucose 4 times daily(now using her CGM), before meals and before bed, and to call the clinic if she has readings less than 70 or above 300 for 3 tests in a row.    - she is warned not to take insulin without proper monitoring per orders. - Adjustment parameters are given to her for hypo and hyperglycemia in writing.  -Given her type 1 diagnosis, insulin is the only treatment option for her.  - Specific targets for  A1c; LDL, HDL, and Triglycerides were discussed with the patient.  2) Blood Pressure /Hypertension:  her blood pressure is controlled to target.   she is advised to continue her current medications including Losartan 25 mg po daily, Norvasc 5 mg po daily and Metoprolol 25 mg p.o. daily with breakfast.  3) Lipids/Hyperlipidemia:     Review of her recent lipid panel from 06/21/21 showed controlled LDL at 74.  she is advised to continue Crestor 10 mg daily at bedtime.  Side effects and precautions discussed with her.  Will recheck lipid panel prior to next visit.  4)  Weight/Diet:  her Body mass index is 32.63 kg/m.  -  clearly complicating her diabetes care.   she is a candidate for some weight loss. I discussed with her the fact that loss of 5 - 10% of her  current body weight will have the most impact on her diabetes management.  Exercise, and detailed carbohydrates information provided  -  detailed on discharge instructions.  5) Chronic Care/Health Maintenance: -she is not on ACEI/ARB and is on Statin medications and is encouraged to initiate and continue to follow up with Ophthalmology, Dentist, Podiatrist at least yearly or according to recommendations, and advised to stay away from smoking. I have recommended yearly flu vaccine and pneumonia vaccine at least every 5 years; moderate intensity exercise for up to 150 minutes weekly; and sleep for at least 7 hours a day.  - she is advised to maintain close follow up with Gwenlyn Perking, FNP for primary care needs, as well as her other providers for optimal and coordinated care.      I spent  33  minutes in the care of the patient today including review of labs from CMP, Lipids, Thyroid Function, Hematology (current and previous  including abstractions from other facilities); face-to-face time discussing  her blood glucose readings/logs, discussing hypoglycemia and hyperglycemia episodes and symptoms, medications doses, her options of short and long term treatment based on the latest standards of care / guidelines;  discussion about incorporating lifestyle medicine;  and documenting the encounter. Risk reduction counseling performed per USPSTF guidelines to reduce obesity and cardiovascular risk factors.     Please refer to Patient Instructions for Blood Glucose Monitoring  and Insulin/Medications Dosing Guide"  in media tab for additional information. Please  also refer to " Patient Self Inventory" in the Media  tab for reviewed elements of pertinent patient history.  Diamond Robbins participated in the discussions, expressed understanding, and voiced agreement with the above plans.  All questions were answered to her satisfaction. she is encouraged to contact clinic should she have any questions or concerns prior to her return visit.     Follow up plan: - Return in about 3 months (around 10/19/2022) for Diabetes F/U with A1c in office, Previsit labs, Bring meter and logs.   [name], [facility] [address] [place], [place] [address] Phone: [phone] Fax: [phone]  07/19/2022, 8:31 AM

## 2022-07-22 ENCOUNTER — Other Ambulatory Visit: Payer: Self-pay

## 2022-07-22 ENCOUNTER — Inpatient Hospital Stay: Payer: Medicaid Other | Attending: Hematology

## 2022-07-22 DIAGNOSIS — C9000 Multiple myeloma not having achieved remission: Secondary | ICD-10-CM | POA: Insufficient documentation

## 2022-07-25 ENCOUNTER — Other Ambulatory Visit: Payer: Self-pay

## 2022-07-25 ENCOUNTER — Inpatient Hospital Stay: Payer: Medicaid Other

## 2022-07-25 DIAGNOSIS — C9000 Multiple myeloma not having achieved remission: Secondary | ICD-10-CM

## 2022-07-25 LAB — PREGNANCY, URINE: Preg Test, Ur: NEGATIVE

## 2022-07-26 ENCOUNTER — Other Ambulatory Visit: Payer: Self-pay | Admitting: Hematology

## 2022-07-28 ENCOUNTER — Other Ambulatory Visit: Payer: Self-pay

## 2022-07-28 MED ORDER — LENALIDOMIDE 10 MG PO CAPS: ORAL_CAPSULE | ORAL | 0 refills | Status: DC

## 2022-07-28 NOTE — Telephone Encounter (Signed)
Chart reviewed. Revlimid refilled per last office note with Dr. Katragadda.  

## 2022-08-01 ENCOUNTER — Encounter (HOSPITAL_COMMUNITY): Payer: Self-pay | Admitting: Hematology

## 2022-08-02 ENCOUNTER — Other Ambulatory Visit: Payer: Self-pay

## 2022-08-03 ENCOUNTER — Inpatient Hospital Stay: Payer: Medicaid Other

## 2022-08-03 ENCOUNTER — Other Ambulatory Visit: Payer: Self-pay | Admitting: Hematology

## 2022-08-03 ENCOUNTER — Encounter (HOSPITAL_COMMUNITY): Payer: Self-pay | Admitting: Hematology

## 2022-08-03 DIAGNOSIS — C9000 Multiple myeloma not having achieved remission: Secondary | ICD-10-CM | POA: Diagnosis not present

## 2022-08-03 LAB — PREGNANCY, URINE: Preg Test, Ur: NEGATIVE

## 2022-08-03 MED ORDER — LENALIDOMIDE 10 MG PO CAPS: ORAL_CAPSULE | ORAL | 0 refills | Status: DC

## 2022-08-03 NOTE — Telephone Encounter (Signed)
Chart reviewed. Revlimid refilled per last office note with Dr. Katragadda.  

## 2022-08-04 ENCOUNTER — Encounter (HOSPITAL_COMMUNITY): Payer: Self-pay | Admitting: Hematology

## 2022-08-10 ENCOUNTER — Inpatient Hospital Stay: Payer: Medicaid Other

## 2022-08-10 DIAGNOSIS — C9000 Multiple myeloma not having achieved remission: Secondary | ICD-10-CM

## 2022-08-10 LAB — COMPREHENSIVE METABOLIC PANEL
ALT: 61 U/L — ABNORMAL HIGH (ref 0–44)
AST: 31 U/L (ref 15–41)
Albumin: 4.2 g/dL (ref 3.5–5.0)
Alkaline Phosphatase: 65 U/L (ref 38–126)
Anion gap: 8 (ref 5–15)
BUN: 7 mg/dL (ref 6–20)
CO2: 28 mmol/L (ref 22–32)
Calcium: 8.9 mg/dL (ref 8.9–10.3)
Chloride: 102 mmol/L (ref 98–111)
Creatinine, Ser: 0.69 mg/dL (ref 0.44–1.00)
GFR, Estimated: >60 mL/min (ref >60)
Glucose, Bld: 257 mg/dL — ABNORMAL HIGH (ref 70–99)
Potassium: 3.2 mmol/L — ABNORMAL LOW (ref 3.5–5.1)
Sodium: 138 mmol/L (ref 135–145)
Total Bilirubin: 0.6 mg/dL (ref 0.3–1.2)
Total Protein: 7.6 g/dL (ref 6.5–8.1)

## 2022-08-10 LAB — CBC WITH DIFFERENTIAL/PLATELET
Abs Immature Granulocytes: 0.02 10*3/uL (ref 0.00–0.07)
Basophils Absolute: 0 10*3/uL (ref 0.0–0.1)
Basophils Relative: 1 %
Eosinophils Absolute: 0.1 10*3/uL (ref 0.0–0.5)
Eosinophils Relative: 3 %
HCT: 42.5 % (ref 36.0–46.0)
Hemoglobin: 13.4 g/dL (ref 12.0–15.0)
Immature Granulocytes: 1 %
Lymphocytes Relative: 35 %
Lymphs Abs: 1.2 10*3/uL (ref 0.7–4.0)
MCH: 27.2 pg (ref 26.0–34.0)
MCHC: 31.5 g/dL (ref 30.0–36.0)
MCV: 86.2 fL (ref 80.0–100.0)
Monocytes Absolute: 0.2 10*3/uL (ref 0.1–1.0)
Monocytes Relative: 7 %
Neutro Abs: 1.9 10*3/uL (ref 1.7–7.7)
Neutrophils Relative %: 53 %
Platelets: 270 10*3/uL (ref 150–400)
RBC: 4.93 MIL/uL (ref 3.87–5.11)
RDW: 13.6 % (ref 11.5–15.5)
WBC: 3.5 10*3/uL — ABNORMAL LOW (ref 4.0–10.5)
nRBC: 0 % (ref 0.0–0.2)

## 2022-08-10 LAB — MAGNESIUM: Magnesium: 1.3 mg/dL — ABNORMAL LOW (ref 1.7–2.4)

## 2022-08-11 LAB — KAPPA/LAMBDA LIGHT CHAINS
Kappa free light chain: 21.1 mg/L — ABNORMAL HIGH (ref 3.3–19.4)
Kappa, lambda light chain ratio: 1.5 (ref 0.26–1.65)
Lambda free light chains: 14.1 mg/L (ref 5.7–26.3)

## 2022-08-12 LAB — PROTEIN ELECTROPHORESIS, SERUM
A/G Ratio: 1.2 (ref 0.7–1.7)
Albumin ELP: 3.8 g/dL (ref 2.9–4.4)
Alpha-1-Globulin: 0.2 g/dL (ref 0.0–0.4)
Alpha-2-Globulin: 0.8 g/dL (ref 0.4–1.0)
Beta Globulin: 1.1 g/dL (ref 0.7–1.3)
Gamma Globulin: 1.2 g/dL (ref 0.4–1.8)
Globulin, Total: 3.3 g/dL (ref 2.2–3.9)
M-Spike, %: 0.3 g/dL — ABNORMAL HIGH
Total Protein ELP: 7.1 g/dL (ref 6.0–8.5)

## 2022-08-13 LAB — IMMUNOFIXATION ELECTROPHORESIS
IgA: 111 mg/dL (ref 87–352)
IgG (Immunoglobin G), Serum: 1349 mg/dL (ref 586–1602)
IgM (Immunoglobulin M), Srm: 32 mg/dL (ref 26–217)
Total Protein ELP: 6.9 g/dL (ref 6.0–8.5)

## 2022-08-15 DIAGNOSIS — Z419 Encounter for procedure for purposes other than remedying health state, unspecified: Secondary | ICD-10-CM | POA: Diagnosis not present

## 2022-08-17 ENCOUNTER — Inpatient Hospital Stay: Payer: Medicaid Other | Attending: Hematology

## 2022-08-17 ENCOUNTER — Inpatient Hospital Stay: Payer: Medicaid Other | Admitting: Hematology

## 2022-08-17 DIAGNOSIS — E109 Type 1 diabetes mellitus without complications: Secondary | ICD-10-CM | POA: Insufficient documentation

## 2022-08-17 DIAGNOSIS — C9000 Multiple myeloma not having achieved remission: Secondary | ICD-10-CM | POA: Insufficient documentation

## 2022-08-17 DIAGNOSIS — Z794 Long term (current) use of insulin: Secondary | ICD-10-CM | POA: Insufficient documentation

## 2022-08-17 DIAGNOSIS — I10 Essential (primary) hypertension: Secondary | ICD-10-CM | POA: Insufficient documentation

## 2022-08-23 NOTE — Progress Notes (Signed)
[facility] [address] [place]bins    Clinic Day:  08/23/2022  Referring physician: Gabriel Earing, FNP  Patient Care Team: Gabriel Earing, FNP as PCP - General (Family Medicine) Therese Sarah, RN as Oncology Nurse Navigator (Oncology) Doreatha Massed, MD as Medical Oncologist (Oncology) Alden Hipp, RPH-CPP (Pharmacist) Dani Gobble, NP as Nurse Practitioner (Endocrinology)   ASSESSMENT & PLAN:   Assessment: 1.  Stage III IgA kappa multiple myeloma: -Presentation to [facility] in [place] with confusion and weakness. -Found to have diabetic ketoacidosis with newly diagnosed type 1 diabetes. -Also found to have acute renal failure and hypercalcemia. -Bone marrow biopsy on 02/25/2020 with flow cytometry with the population CD138 positive, CD56 positive IgA kappa monoclonal plasma cells 12%.  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 -Ultrasound abdomen on 02/23/2020 showed increased hepatic echogenicity with normal spleen. -Renal ultrasound on 02/24/2020 was normal. -CT chest on 02/22/2020 with moth eaten appearance of the bones. -Beta-2 microglobulin 2.1.  LDH 322.  SPEP and immunofixation were normal.  Kappa light chains are elevated at 36.8.  Lambda light chains 12.9 and ratio of 2.85. -24-hour urine immunofixation was negative.  M spike was negative. -PET scan on 03/23/2020 showed no FDG radiotracer activity, no soft tissue plasmacytoma.  Diffuse multiple small lytic lesions within the pelvis, spine and vertebral bodies consistent with myeloma. -Chromosome analysis was 59, XX.  Multiple myeloma FISH panel was normal. -She is considered high risk based on elevated LDH level. -4 cycles of Dara VRD from 03/31/2020 through 06/12/2020. 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 - BMBX on  12/15/2020 with variably normocellular 30 to 70% with trilineage hematopoiesis.  No increase in plasma cells.  Myeloma FISH panel was normal.  Chromosome analysis was normal. - MRD results were negative. - Maintenance Revlimid 10 mg 3 weeks on/1 week off started around 12/08/2020.   2.  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  Non-smoker. -No family history of myeloma or other malignancies.   Plan: 1.  Stage III IgA kappa multiple myeloma: - She is tolerating Revlimid very well. - Reviewed myeloma labs from 05/25/2022.  M spike is negative.  Free light chain ratio is 1.54.  Immunofixation shows normal. - She reported epigastric abdominal achy pain on and off for the last 3 days since she started back on her Revlimid.  She reported associated constipation since the start of Revlimid.  Bowel movement helped improve the pain. - She will continue Revlimid 10 mg 3 weeks on/1 week off.  She will call us if the pain gets any worse. - Reviewed labs today which showed normal CBC with mild leukopenia.  ALT is mildly elevated at 53 and rest of LFTs are normal. - RTC 12 weeks for follow-up with repeat myeloma labs.   2.  Type I diabetes: - Continue Lantus 35 units at bedtime and NovoLog sliding scale.   3.  Hypertension: - She forgot to take her blood pressure medication today.  She will continue losartan, metoprolol and Norvasc daily.   4.  Bone protection:  - Calcium is 9.5.  Continue calcium supplements. - Continue Zometa today and every 12 weeks.  No orders of the defined types were placed in this encounter.     I,Katie Daubenspeck,acting as a Neurosurgeon for Doreatha Massed, MD.,have documented all relevant documentation on the behalf of  Doreatha Massed, MD,as directed by  Doreatha Massed, MD while in the presence of Doreatha Massed, MD.   ***  Mickie Bail   4/9/20248:10 PM  CHIEF COMPLAINT:   Diagnosis: IgA kappa multiple myeloma    Cancer Staging  No matching staging information was found for the patient.   Prior Therapy: 1. Dara RVD 2. bone marrow transplant on 09/10/2020   Current Therapy:  Revlimid 10 mg 3 weeks on/1 week off    HISTORY OF PRESENT ILLNESS:   Oncology History  Multiple myeloma  03/09/2020 Initial Diagnosis   Multiple myeloma (HCC)   03/31/2020 - 06/12/2020 Chemotherapy            INTERVAL HISTORY:   Diamond Robbins is a 32 y.o. female presenting to clinic today for follow up of IgA kappa multiple myeloma. She was last seen by me on 06/01/22.  Today, she states that she is doing well overall. Her appetite level is at ***%. Her energy level is at ***%.  PAST MEDICAL HISTORY:   Past Medical History: Past Medical History:  Diagnosis Date   Cancer (HCC) 08/01/2020   multiple myeloma   DKA (diabetic ketoacidosis) (HCC) 02/23/2020   Hypertension    Type 1 diabetes mellitus (HCC) 02/23/2020    Surgical History: Past Surgical History:  Procedure Laterality Date   BONE MARROW TRANSPLANT     NO PAST SURGERIES      Social History: Social History   Socioeconomic History   Marital status: Married    Spouse name: Rolm Gala   Number of children: Not on file   Years of education: 12   Highest education level: High school graduate  Occupational History   Occupation: unemployed    Comment: can go back to work Jan 2023  Tobacco Use   Smoking status: Never   Smokeless tobacco: Never  Vaping Use   Vaping Use: Never used  Substance and Sexual Activity   Alcohol use: Not Currently    Comment: occ- 0-1 drinks per week; maybe 1 drink every 6 months   Drug use: No   Sexual activity: Yes    Birth control/protection: Condom  Other Topics Concern   Not on file  Social History Narrative   Has 2 step-daughters   Social Determinants of Health   Financial Resource Strain: Low Risk  (04/25/2022)   Overall Financial Resource Strain (CARDIA)    Difficulty of Paying Living Expenses: Not hard at  all  Food Insecurity: No Food Insecurity (04/07/2021)   Hunger Vital Sign    Worried About Running Out of Food in the Last Year: Never true    Ran Out of Food in the Last Year: Never true  Transportation Needs: No Transportation Needs (04/25/2022)   PRAPARE - Administrator, Civil Service (Medical): No    Lack of Transportation (Non-Medical): No  Physical Activity: Sufficiently Active (06/08/2021)   Exercise Vital Sign    Days of Exercise per Week: 3 days    Minutes of Exercise per Session: 60 min  Stress: No Stress Concern Present (03/09/2020)   [employer] of Occupational Health - Occupational Stress Questionnaire    Feeling of Stress : Not at all  Social Connections: Socially Integrated (03/08/2021)   Social Connection and Isolation Panel [NHANES]    Frequency of Communication with Friends and Family: More than three times a week    Frequency of Social Gatherings with Friends and Family: More than three times a week    Attends Religious Services: More than  4 times per year    Active Member of Clubs or Organizations: Yes    Attends BankerClub or Organization Meetings: Never    Marital Status: Married  Catering managerntimate Partner Violence: Not At Risk (03/09/2020)   Humiliation, Afraid, Rape, and Kick questionnaire    Fear of Current or Ex-Partner: No    Emotionally Abused: No    Physically Abused: No    Sexually Abused: No    Family History: Family History  Problem Relation Age of Onset   Hypertension Mother    Diabetes Father    Hypertension Father    Stroke Maternal Grandmother    Hypertension Maternal Grandmother    Diabetes Maternal Grandmother    Hypertension Maternal Grandfather    Heart attack Maternal Grandfather    Hypertension Paternal Grandmother    Hypertension Paternal Grandfather     Current Medications:  Current Outpatient Medications:    ACCU-CHEK GUIDE test strip, Use to check blood glucose 4 times daily., Disp: 125 each, Rfl: 1   Accu-Chek  Softclix Lancets lancets, Use to check blood glucose 4 times daily., Disp: 200 each, Rfl: 0   amLODipine (NORVASC) 5 MG tablet, Take 1 tablet (5 mg total) by mouth daily., Disp: 90 tablet, Rfl: 1   aspirin 325 MG tablet, Take 1 tablet by mouth daily., Disp: , Rfl:    Blood Glucose Monitoring Suppl (ACCU-CHEK GUIDE) w/Device KIT, , Disp: , Rfl:    Blood Pressure Monitor KIT, Use to check blood pressure as instructed, Disp: 1 kit, Rfl: 0   calcium carbonate (OS-CAL) 1250 (500 Ca) MG chewable tablet, Chew 1 tablet by mouth daily., Disp: , Rfl:    cetirizine (ZYRTEC) 10 MG tablet, Take 1 tablet by mouth once daily, Disp: 30 tablet, Rfl: 0   cholecalciferol (VITAMIN D3) 25 MCG (1000 UNIT) tablet, Take 1,000 Units by mouth daily., Disp: , Rfl:    Continuous Blood Gluc Sensor (DEXCOM G6 SENSOR) MISC, CHANGE SENSOR EVERY 10 DAYS AS DIRECTED, Disp: 1 each, Rfl: 0   Continuous Blood Gluc Transmit (DEXCOM G6 TRANSMITTER) MISC, Change transmitter every 90 days as directed., Disp: 1 each, Rfl: 3   fluticasone (FLONASE) 50 MCG/ACT nasal spray, Place 2 sprays into both nostrils daily., Disp: 16 g, Rfl: 6   insulin aspart (NOVOLOG FLEXPEN) 100 UNIT/ML FlexPen, Inject 6-12 Units into the skin 3 (three) times daily with meals., Disp: 30 mL, Rfl: 3   Insulin Glargine Solostar (LANTUS) 100 UNIT/ML Solostar Pen, Inject 35 Units into the skin daily., Disp: , Rfl:    lenalidomide (REVLIMID) 10 MG capsule, Take 1 capsule daily for 21 days, then take 7 days off, Disp: 21 capsule, Rfl: 0   losartan (COZAAR) 25 MG tablet, Take 1 tablet by mouth once daily, Disp: 30 tablet, Rfl: 6   metoprolol tartrate (LOPRESSOR) 25 MG tablet, Take 1/2 (one-half) tablet by mouth twice daily, Disp: 15 tablet, Rfl: 0   Multiple Vitamin (MULTIVITAMIN) tablet, Take 1 tablet by mouth daily., Disp: , Rfl:    rosuvastatin (CRESTOR) 20 MG tablet, Take 1 tablet by mouth daily., Disp: , Rfl:    Allergies: No Known Allergies  REVIEW OF SYSTEMS:    Review of Systems  Constitutional:  Negative for chills, fatigue and fever.  HENT:   Negative for lump/mass, mouth sores, nosebleeds, sore throat and trouble swallowing.   Eyes:  Negative for eye problems.  Respiratory:  Negative for cough and shortness of breath.   Cardiovascular:  Negative for chest pain, leg swelling and palpitations.  Gastrointestinal:  Negative for abdominal pain, constipation, diarrhea, nausea and vomiting.  Genitourinary:  Negative for bladder incontinence, difficulty urinating, dysuria, frequency, hematuria and nocturia.   Musculoskeletal:  Negative for arthralgias, back pain, flank pain, myalgias and neck pain.  Skin:  Negative for itching and rash.  Neurological:  Negative for dizziness, headaches and numbness.  Hematological:  Does not bruise/bleed easily.  Psychiatric/Behavioral:  Negative for depression, sleep disturbance and suicidal ideas. The patient is not nervous/anxious.   All other systems reviewed and are negative.    VITALS:   There were no vitals taken for this visit.  Wt Readings from Last 3 Encounters:  07/19/22 184 lb 3.2 oz (83.6 kg)  06/30/22 184 lb 2 oz (83.5 kg)  06/01/22 179 lb 14.3 oz (81.6 kg)    There is no height or weight on file to calculate BMI.  Performance status (ECOG): {CHL ONC [id number]  PHYSICAL EXAM:   Physical Exam Vitals and nursing note reviewed. Exam conducted with a chaperone present.  Constitutional:      Appearance: Normal appearance.  Cardiovascular:     Rate and Rhythm: Normal rate and regular rhythm.     Pulses: Normal pulses.     Heart sounds: Normal heart sounds.  Pulmonary:     Effort: Pulmonary effort is normal.     Breath sounds: Normal breath sounds.  Abdominal:     Palpations: Abdomen is soft. There is no hepatomegaly, splenomegaly or mass.     Tenderness: There is no abdominal tenderness.  Musculoskeletal:     Right lower leg: No edema.     Left lower leg: No edema.   Lymphadenopathy:     Cervical: No cervical adenopathy.     Right cervical: No superficial, deep or posterior cervical adenopathy.    Left cervical: No superficial, deep or posterior cervical adenopathy.     Upper Body:     Right upper body: No supraclavicular or axillary adenopathy.     Left upper body: No supraclavicular or axillary adenopathy.  Neurological:     General: No focal deficit present.     Mental Status: She is alert and oriented to person, place, and time.  Psychiatric:        Mood and Affect: Mood normal.        Behavior: Behavior normal.     LABS:      Latest Ref Rng & Units 08/10/2022   11:05 AM 05/25/2022    1:23 PM 02/22/2022    9:00 AM  CBC  WBC 4.0 - 10.5 K/uL 3.5  3.9  3.4   Hemoglobin 12.0 - 15.0 g/dL 32.9  51.8  84.1   Hematocrit 36.0 - 46.0 % 42.5  42.0  42.7   Platelets 150 - 400 K/uL 270  311  277       Latest Ref Rng & Units 08/10/2022   11:05 AM 05/25/2022    1:23 PM 02/22/2022    9:00 AM  CMP  Glucose 70 - 99 mg/dL 660  630  160   BUN 6 - 20 mg/dL 7  13  10    Creatinine 0.44 - 1.00 mg/dL 1.09  3.23  5.57   Sodium 135 - 145 mmol/L 138  134  139   Potassium 3.5 - 5.1 mmol/L 3.2  3.6  3.5   Chloride 98 - 111 mmol/L 102  104  105   CO2 22 - 32 mmol/L 28  25  25    Calcium 8.9 - 10.3 mg/dL 8.9  9.5  9.5   Total Protein 6.5 - 8.1 g/dL 7.6  7.9  8.1   Total Bilirubin 0.3 - 1.2 mg/dL 0.6  0.4  0.6   Alkaline Phos 38 - 126 U/L 65  76  67   AST 15 - 41 U/L 31  33  32   ALT 0 - 44 U/L 61  53  49      No results found for: "CEA1", "CEA" / No results found for: "CEA1", "CEA" No results found for: "PSA1" No results found for: "NFA213" No results found for: "CAN125"  Lab Results  Component Value Date   TOTALPROTELP 6.9 08/10/2022   TOTALPROTELP 7.1 08/10/2022   ALBUMINELP 3.8 08/10/2022   A1GS 0.2 08/10/2022   A2GS 0.8 08/10/2022   BETS 1.1 08/10/2022   GAMS 1.2 08/10/2022   MSPIKE 0.3 (H) 08/10/2022   SPEI Comment 08/10/2022   No results  found for: "TIBC", "FERRITIN", "IRONPCTSAT" Lab Results  Component Value Date   LDH 152 11/24/2021   LDH 160 08/30/2021   LDH 136 08/11/2021     STUDIES:   No results found.

## 2022-08-24 ENCOUNTER — Inpatient Hospital Stay: Payer: Medicaid Other

## 2022-08-24 ENCOUNTER — Inpatient Hospital Stay (HOSPITAL_BASED_OUTPATIENT_CLINIC_OR_DEPARTMENT_OTHER): Payer: Medicaid Other | Admitting: Hematology

## 2022-08-24 VITALS — BP 130/98 | HR 60 | Temp 98.3°F | Resp 18 | Ht 63.0 in | Wt 181.4 lb

## 2022-08-24 DIAGNOSIS — C9000 Multiple myeloma not having achieved remission: Secondary | ICD-10-CM | POA: Diagnosis not present

## 2022-08-24 DIAGNOSIS — I10 Essential (primary) hypertension: Secondary | ICD-10-CM | POA: Diagnosis not present

## 2022-08-24 DIAGNOSIS — Z794 Long term (current) use of insulin: Secondary | ICD-10-CM | POA: Diagnosis not present

## 2022-08-24 DIAGNOSIS — E109 Type 1 diabetes mellitus without complications: Secondary | ICD-10-CM | POA: Diagnosis not present

## 2022-08-24 LAB — CBC WITH DIFFERENTIAL/PLATELET
Abs Immature Granulocytes: 0.02 10*3/uL (ref 0.00–0.07)
Basophils Absolute: 0 10*3/uL (ref 0.0–0.1)
Basophils Relative: 1 %
Eosinophils Absolute: 0.2 10*3/uL (ref 0.0–0.5)
Eosinophils Relative: 5 %
HCT: 41.8 % (ref 36.0–46.0)
Hemoglobin: 13.4 g/dL (ref 12.0–15.0)
Immature Granulocytes: 1 %
Lymphocytes Relative: 46 %
Lymphs Abs: 1.4 10*3/uL (ref 0.7–4.0)
MCH: 27.8 pg (ref 26.0–34.0)
MCHC: 32.1 g/dL (ref 30.0–36.0)
MCV: 86.7 fL (ref 80.0–100.0)
Monocytes Absolute: 0.4 10*3/uL (ref 0.1–1.0)
Monocytes Relative: 13 %
Neutro Abs: 1 10*3/uL — ABNORMAL LOW (ref 1.7–7.7)
Neutrophils Relative %: 34 %
Platelets: 243 10*3/uL (ref 150–400)
RBC: 4.82 MIL/uL (ref 3.87–5.11)
RDW: 14.1 % (ref 11.5–15.5)
WBC: 3 10*3/uL — ABNORMAL LOW (ref 4.0–10.5)
nRBC: 0 % (ref 0.0–0.2)

## 2022-08-24 LAB — COMPREHENSIVE METABOLIC PANEL
ALT: 51 U/L — ABNORMAL HIGH (ref 0–44)
AST: 36 U/L (ref 15–41)
Albumin: 4 g/dL (ref 3.5–5.0)
Alkaline Phosphatase: 72 U/L (ref 38–126)
Anion gap: 7 (ref 5–15)
BUN: 7 mg/dL (ref 6–20)
CO2: 23 mmol/L (ref 22–32)
Calcium: 8.6 mg/dL — ABNORMAL LOW (ref 8.9–10.3)
Chloride: 107 mmol/L (ref 98–111)
Creatinine, Ser: 0.59 mg/dL (ref 0.44–1.00)
GFR, Estimated: >60 mL/min (ref >60)
Glucose, Bld: 146 mg/dL — ABNORMAL HIGH (ref 70–99)
Potassium: 3.5 mmol/L (ref 3.5–5.1)
Sodium: 137 mmol/L (ref 135–145)
Total Bilirubin: 0.7 mg/dL (ref 0.3–1.2)
Total Protein: 7.5 g/dL (ref 6.5–8.1)

## 2022-08-24 LAB — MAGNESIUM: Magnesium: 1.9 mg/dL (ref 1.7–2.4)

## 2022-08-24 LAB — PREGNANCY, URINE: Preg Test, Ur: NEGATIVE

## 2022-08-24 MED ORDER — ZOLEDRONIC ACID 4 MG/100ML IV SOLN
4.0000 mg | Freq: Once | INTRAVENOUS | Status: AC
  Administered 2022-08-24: 4 mg via INTRAVENOUS
  Filled 2022-08-24: qty 100

## 2022-08-24 MED ORDER — SODIUM CHLORIDE 0.9 % IV SOLN: Freq: Once | INTRAVENOUS | Status: AC

## 2022-08-24 NOTE — Patient Instructions (Signed)
[facility]  Discharge Instructions: Thank you for choosing [facility] to provide your oncology and hematology care.  If you have a lab appointment with the Cancer Center - please note that after April 8th, 2024, all labs will be drawn in the cancer center.  You do not have to check in or register with the main entrance as you have in the past but will complete your check-in in the cancer center.  Wear comfortable clothing and clothing appropriate for easy access to any Portacath or PICC line.   We strive to give you quality time with your provider. You may need to reschedule your appointment if you arrive late (15 or more minutes).  Arriving late affects you and other patients whose appointments are after yours.  Also, if you miss three or more appointments without notifying the office, you may be dismissed from the clinic at the provider's discretion.      For prescription refill requests, have your pharmacy contact our office and allow 72 hours for refills to be completed.    Today you received the following chemotherapy and/or immunotherapy agents Zometa 4mg  IV    To help prevent nausea and vomiting after your treatment, we encourage you to take your nausea medication as directed.  BELOW ARE SYMPTOMS THAT SHOULD BE REPORTED IMMEDIATELY: *FEVER GREATER THAN 100.4 F (38 C) OR HIGHER *CHILLS OR SWEATING *NAUSEA AND VOMITING THAT IS NOT CONTROLLED WITH YOUR NAUSEA MEDICATION *UNUSUAL SHORTNESS OF BREATH *UNUSUAL BRUISING OR BLEEDING *URINARY PROBLEMS (pain or burning when urinating, or frequent urination) *BOWEL PROBLEMS (unusual diarrhea, constipation, pain near the anus) TENDERNESS IN MOUTH AND THROAT WITH OR WITHOUT PRESENCE OF ULCERS (sore throat, sores in mouth, or a toothache) UNUSUAL RASH, SWELLING OR PAIN  UNUSUAL VAGINAL DISCHARGE OR ITCHING   Items with * indicate a potential emergency and should be followed up as soon as possible or go to the  Emergency Department if any problems should occur.  Please show the CHEMOTHERAPY ALERT CARD or IMMUNOTHERAPY ALERT CARD at check-in to the Emergency Department and triage nurse.  Should you have questions after your visit or need to cancel or reschedule your appointment, please contact [facility] [phone]  and follow the prompts.  Office hours are 8:00 a.m. to 4:30 p.m. Monday - Friday. Please note that voicemails left after 4:00 p.m. may not be returned until the following business day.  We are closed weekends and major holidays. You have access to a nurse at all times for urgent questions. Please call the main number to the clinic [phone] and follow the prompts.  For any non-urgent questions, you may also contact your provider using MyChart. We now offer e-Visits for anyone 87 and older to request care online for non-urgent symptoms. For details visit [email or web address].   Also download the MyChart app! Go to the app store, search "MyChart", open the app, select [facility], and log in with your MyChart username and password.

## 2022-08-24 NOTE — Progress Notes (Signed)
Patient is taking Revlimid as prescribed.  She has not missed any doses and reports no side effects at this time.  ? ?

## 2022-08-24 NOTE — Progress Notes (Signed)
Patient presents today for Zometa 4mg  IV per provider's order. Vital signs stable and pt voiced no new complaints at this time.  Peripheral IV started with good blood return pre and post infusion.  Zometa 4mg  IV  given today per MD orders. Tolerated infusion without adverse affects. Vital signs stable. No complaints at this time. Discharged from clinic ambulatory in stable condition. Alert and oriented x 3. F/U with [facility] as scheduled.

## 2022-08-24 NOTE — Patient Instructions (Signed)
[facility] Discharge Instructions   You were seen and examined today by Dr. Ellin Saba.  He reviewed the results of your lab work which are normal/stable.   We will proceed with your Zometa infusion today.   Continue Revlimid as prescribed.   Return as scheduled.    Thank you for choosing [facility] at [facility] to provide your oncology and hematology care.  To afford each patient quality time with our provider, please arrive at least 15 minutes before your scheduled appointment time.   If you have a lab appointment with the Cancer Center please come in thru the Main Entrance and check in at the main information desk.  You need to re-schedule your appointment should you arrive 10 or more minutes late.  We strive to give you quality time with our providers, and arriving late affects you and other patients whose appointments are after yours.  Also, if you no show three or more times for appointments you may be dismissed from the clinic at the providers discretion.     Again, thank you for choosing [facility].  Our hope is that these requests will decrease the amount of time that you wait before being seen by our physicians.       _____________________________________________________________  Should you have questions after your visit to [facility], please contact our office at [phone] and follow the prompts.  Our office hours are 8:00 a.m. and 4:30 p.m. Monday - Friday.  Please note that voicemails left after 4:00 p.m. may not be returned until the following business day.  We are closed weekends and major holidays.  You do have access to a nurse 24-7, just call the main number to the clinic [phone] and do not press any options, hold on the line and a nurse will answer the phone.    For prescription refill requests, have your pharmacy contact our office and allow 72 hours.    Due to Covid,  you will need to wear a mask upon entering the hospital. If you do not have a mask, a mask will be given to you at the Main Entrance upon arrival. For doctor visits, patients may have 1 support person age 11 or older with them. For treatment visits, patients can not have anyone with them due to social distancing guidelines and our immunocompromised population.

## 2022-08-25 ENCOUNTER — Other Ambulatory Visit: Payer: Self-pay | Admitting: Hematology

## 2022-08-25 LAB — KAPPA/LAMBDA LIGHT CHAINS
Kappa free light chain: 26 mg/L — ABNORMAL HIGH (ref 3.3–19.4)
Kappa, lambda light chain ratio: 1.55 (ref 0.26–1.65)
Lambda free light chains: 16.8 mg/L (ref 5.7–26.3)

## 2022-08-26 LAB — PROTEIN ELECTROPHORESIS, SERUM
A/G Ratio: 1.2 (ref 0.7–1.7)
Albumin ELP: 3.7 g/dL (ref 2.9–4.4)
Alpha-1-Globulin: 0.2 g/dL (ref 0.0–0.4)
Alpha-2-Globulin: 0.8 g/dL (ref 0.4–1.0)
Beta Globulin: 1 g/dL (ref 0.7–1.3)
Gamma Globulin: 1.2 g/dL (ref 0.4–1.8)
Globulin, Total: 3.2 g/dL (ref 2.2–3.9)
Total Protein ELP: 6.9 g/dL (ref 6.0–8.5)

## 2022-08-29 ENCOUNTER — Other Ambulatory Visit: Payer: Self-pay

## 2022-08-29 ENCOUNTER — Inpatient Hospital Stay: Payer: Medicaid Other

## 2022-08-29 DIAGNOSIS — I10 Essential (primary) hypertension: Secondary | ICD-10-CM | POA: Diagnosis not present

## 2022-08-29 DIAGNOSIS — Z794 Long term (current) use of insulin: Secondary | ICD-10-CM | POA: Diagnosis not present

## 2022-08-29 DIAGNOSIS — C9000 Multiple myeloma not having achieved remission: Secondary | ICD-10-CM

## 2022-08-29 DIAGNOSIS — E109 Type 1 diabetes mellitus without complications: Secondary | ICD-10-CM | POA: Diagnosis not present

## 2022-08-29 LAB — IMMUNOFIXATION ELECTROPHORESIS
IgA: 112 mg/dL (ref 87–352)
IgG (Immunoglobin G), Serum: 1302 mg/dL (ref 586–1602)
IgM (Immunoglobulin M), Srm: 33 mg/dL (ref 26–217)
Total Protein ELP: 7.2 g/dL (ref 6.0–8.5)

## 2022-08-29 LAB — PREGNANCY, URINE: Preg Test, Ur: NEGATIVE

## 2022-08-29 MED ORDER — LENALIDOMIDE 10 MG PO CAPS: ORAL_CAPSULE | ORAL | 0 refills | Status: DC

## 2022-08-29 NOTE — Telephone Encounter (Signed)
Chart reviewed. Revlimid refilled per last office note with Dr. Katragadda.  

## 2022-08-31 ENCOUNTER — Other Ambulatory Visit: Payer: Self-pay | Admitting: Nurse Practitioner

## 2022-09-07 ENCOUNTER — Other Ambulatory Visit: Payer: Self-pay | Admitting: Nurse Practitioner

## 2022-09-07 ENCOUNTER — Other Ambulatory Visit: Payer: Self-pay

## 2022-09-07 ENCOUNTER — Telehealth: Payer: Self-pay | Admitting: Nurse Practitioner

## 2022-09-07 DIAGNOSIS — E1069 Type 1 diabetes mellitus with other specified complication: Secondary | ICD-10-CM

## 2022-09-07 MED ORDER — INSULIN GLARGINE SOLOSTAR 100 UNIT/ML ~~LOC~~ SOPN: 35.0000 [IU] | PEN_INJECTOR | Freq: Every day | SUBCUTANEOUS | 2 refills | Status: DC

## 2022-09-07 NOTE — Telephone Encounter (Signed)
Pt is requesting refill of lantus sent into [facility], stated they were supposed to send over a request yesterday.

## 2022-09-07 NOTE — Telephone Encounter (Signed)
Rx sent 

## 2022-09-14 DIAGNOSIS — Z419 Encounter for procedure for purposes other than remedying health state, unspecified: Secondary | ICD-10-CM | POA: Diagnosis not present

## 2022-09-22 ENCOUNTER — Other Ambulatory Visit: Payer: Self-pay | Admitting: Hematology

## 2022-09-23 ENCOUNTER — Encounter (HOSPITAL_COMMUNITY): Payer: Self-pay | Admitting: Hematology

## 2022-09-28 ENCOUNTER — Other Ambulatory Visit: Payer: Self-pay

## 2022-09-28 ENCOUNTER — Inpatient Hospital Stay: Payer: Medicaid Other | Attending: Hematology

## 2022-09-28 DIAGNOSIS — C9000 Multiple myeloma not having achieved remission: Secondary | ICD-10-CM | POA: Diagnosis not present

## 2022-09-28 LAB — PREGNANCY, URINE: Preg Test, Ur: NEGATIVE

## 2022-09-28 MED ORDER — LENALIDOMIDE 10 MG PO CAPS: ORAL_CAPSULE | ORAL | 0 refills | Status: DC

## 2022-09-28 NOTE — Telephone Encounter (Signed)
Chart reviewed. Revlimid refilled per last office note with Dr. Katragadda.  

## 2022-10-15 DIAGNOSIS — Z419 Encounter for procedure for purposes other than remedying health state, unspecified: Secondary | ICD-10-CM | POA: Diagnosis not present

## 2022-10-20 ENCOUNTER — Ambulatory Visit: Payer: Medicaid Other | Admitting: Nurse Practitioner

## 2022-10-20 DIAGNOSIS — E1065 Type 1 diabetes mellitus with hyperglycemia: Secondary | ICD-10-CM

## 2022-10-20 DIAGNOSIS — Z794 Long term (current) use of insulin: Secondary | ICD-10-CM

## 2022-10-20 DIAGNOSIS — E782 Mixed hyperlipidemia: Secondary | ICD-10-CM

## 2022-10-20 DIAGNOSIS — I10 Essential (primary) hypertension: Secondary | ICD-10-CM

## 2022-10-27 ENCOUNTER — Other Ambulatory Visit: Payer: Self-pay | Admitting: Hematology

## 2022-10-31 ENCOUNTER — Other Ambulatory Visit: Payer: Self-pay | Admitting: Hematology

## 2022-11-01 ENCOUNTER — Inpatient Hospital Stay: Payer: Medicaid Other | Attending: Hematology

## 2022-11-09 ENCOUNTER — Inpatient Hospital Stay: Payer: Medicaid Other

## 2022-11-10 ENCOUNTER — Other Ambulatory Visit: Payer: Self-pay | Admitting: Hematology

## 2022-11-10 ENCOUNTER — Other Ambulatory Visit: Payer: Self-pay

## 2022-11-10 ENCOUNTER — Encounter (HOSPITAL_COMMUNITY): Payer: Self-pay

## 2022-11-10 ENCOUNTER — Emergency Department (HOSPITAL_COMMUNITY): Payer: No Typology Code available for payment source

## 2022-11-10 ENCOUNTER — Encounter: Payer: Self-pay | Admitting: Family Medicine

## 2022-11-10 ENCOUNTER — Emergency Department (HOSPITAL_COMMUNITY)
Admission: EM | Admit: 2022-11-10 | Discharge: 2022-11-11 | Disposition: A | Discharge status: Home / Self Care | Payer: No Typology Code available for payment source | Attending: Emergency Medicine | Admitting: Emergency Medicine

## 2022-11-10 ENCOUNTER — Encounter (HOSPITAL_COMMUNITY): Payer: Self-pay | Admitting: Hematology

## 2022-11-10 DIAGNOSIS — T148XXA Other injury of unspecified body region, initial encounter: Secondary | ICD-10-CM | POA: Diagnosis not present

## 2022-11-10 DIAGNOSIS — S80812A Abrasion, left lower leg, initial encounter: Secondary | ICD-10-CM | POA: Diagnosis not present

## 2022-11-10 DIAGNOSIS — M25522 Pain in left elbow: Secondary | ICD-10-CM | POA: Diagnosis not present

## 2022-11-10 DIAGNOSIS — Z794 Long term (current) use of insulin: Secondary | ICD-10-CM | POA: Insufficient documentation

## 2022-11-10 DIAGNOSIS — I10 Essential (primary) hypertension: Secondary | ICD-10-CM | POA: Diagnosis not present

## 2022-11-10 DIAGNOSIS — R58 Hemorrhage, not elsewhere classified: Secondary | ICD-10-CM | POA: Diagnosis not present

## 2022-11-10 DIAGNOSIS — Z8579 Personal history of other malignant neoplasms of lymphoid, hematopoietic and related tissues: Secondary | ICD-10-CM | POA: Diagnosis not present

## 2022-11-10 DIAGNOSIS — Z7982 Long term (current) use of aspirin: Secondary | ICD-10-CM | POA: Diagnosis not present

## 2022-11-10 DIAGNOSIS — S60222A Contusion of left hand, initial encounter: Secondary | ICD-10-CM | POA: Diagnosis not present

## 2022-11-10 DIAGNOSIS — Z79899 Other long term (current) drug therapy: Secondary | ICD-10-CM | POA: Diagnosis not present

## 2022-11-10 DIAGNOSIS — Y92410 Unspecified street and highway as the place of occurrence of the external cause: Secondary | ICD-10-CM | POA: Insufficient documentation

## 2022-11-10 DIAGNOSIS — E1010 Type 1 diabetes mellitus with ketoacidosis without coma: Secondary | ICD-10-CM | POA: Insufficient documentation

## 2022-11-10 DIAGNOSIS — S80811A Abrasion, right lower leg, initial encounter: Secondary | ICD-10-CM | POA: Diagnosis not present

## 2022-11-10 DIAGNOSIS — S6992XA Unspecified injury of left wrist, hand and finger(s), initial encounter: Secondary | ICD-10-CM | POA: Diagnosis not present

## 2022-11-10 DIAGNOSIS — S60212A Contusion of left wrist, initial encounter: Secondary | ICD-10-CM | POA: Insufficient documentation

## 2022-11-10 DIAGNOSIS — Z743 Need for continuous supervision: Secondary | ICD-10-CM | POA: Diagnosis not present

## 2022-11-10 LAB — CBG MONITORING, ED: Glucose-Capillary: 229 mg/dL — ABNORMAL HIGH (ref 70–99)
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
## 2022-11-10 NOTE — ED Notes (Signed)
Pt presents with minor skin abrasions to left arm, with left elbow pain, and endorses pain bilateral shins on both lower extremities.

## 2022-11-11 MED ORDER — IBUPROFEN 600 MG PO TABS: 600.0000 mg | ORAL_TABLET | Freq: Four times a day (QID) | ORAL | 0 refills | Status: DC | PRN
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
## 2022-11-14 ENCOUNTER — Telehealth: Payer: Self-pay | Admitting: Licensed Clinical Social Worker

## 2022-11-14 DIAGNOSIS — Z419 Encounter for procedure for purposes other than remedying health state, unspecified: Secondary | ICD-10-CM | POA: Diagnosis not present

## 2022-11-14 NOTE — Transitions of Care (Post Inpatient/ED Visit) (Signed)
11/14/2022  Name: Diamond Robbins MRN: [id number] DOB: 09/27/1990  Today's TOC FU Call Status: Today's TOC FU Call Status:: Successful TOC FU Call Competed TOC FU Call Complete Date: 11/15/22  Transition Care Management Follow-up Telephone Call Date of Discharge: 11/10/22 [facility] Type of Discharge: Emergency Department Reason for ED Visit: Other: How have you been since you were released from the hospital?: Better Any questions or concerns?: No  Items Reviewed: Did you receive and understand the discharge instructions provided?: Yes Medications obtained,verified, and reconciled?: Yes (Medications Reviewed) Any new allergies since your discharge?: No Dietary orders reviewed?: No Do you have support at home?: Yes People in Home: spouse Name of Support/Comfort Primary Source: Spouse  Medications Reviewed Today: Medications Reviewed Today     Reviewed by Lewis Shock, RN (Registered Nurse) on 11/10/22 at 2134  Med List Status: <None>   Medication Order Taking? Sig Documenting Provider Last Dose Status Informant  ACCU-CHEK GUIDE test strip [id number]  Use to check blood glucose 4 times daily. Dani Gobble, NP  Active   Accu-Chek Softclix Lancets lancets [id number]  Use to check blood glucose 4 times daily. Dani Gobble, NP  Active   amLODipine (NORVASC) 5 MG tablet [id number] Yes Take 1 tablet (5 mg total) by mouth daily. Doreatha Massed, MD 11/09/2022 Active   aspirin 325 MG tablet [id number] Yes Take 1 tablet by mouth daily. [provider] 11/09/2022 Active   Blood Glucose Monitoring Suppl (ACCU-CHEK GUIDE) w/Device KIT [id number]   [provider]  Active Self  Blood Pressure Monitor KIT [id number]  Use to check blood pressure as instructed Kerri Perches, MD  Active   calcium carbonate (OS-CAL) 1250 (500 Ca) MG chewable tablet [id number] Yes Chew 1 tablet by mouth daily. [provider] 11/09/2022 Active    cetirizine (ZYRTEC) 10 MG tablet [id number] Yes Take 1 tablet by mouth once daily Paseda, Folashade R, FNP Past Week Active   cholecalciferol (VITAMIN D3) 25 MCG (1000 UNIT) tablet [id number]  Take 1,000 Units by mouth daily. [provider]  Active   Continuous Glucose Sensor (DEXCOM G6 SENSOR) MISC [id number]  CHANGE SENSOR EVERY 10 DAYS AS DIRECTED Dani Gobble, NP  Active   Continuous Glucose Transmitter (DEXCOM G6 TRANSMITTER) MISC [id number]  CHANGE TRANSMITTER EVERY 90 DAYS AS DIRECTED Dani Gobble, NP  Active   fluticasone (FLONASE) 50 MCG/ACT nasal spray [id number] Yes Place 2 sprays into both nostrils daily. Donell Beers, FNP 11/09/2022 Active            Med Note Laural Benes, JOLIZA   Thu Sep 09, 2021 10:52 AM) As needed  insulin aspart (NOVOLOG FLEXPEN) 100 UNIT/ML FlexPen [id number] Yes Inject 6-12 Units into the skin 3 (three) times daily with meals. Dani Gobble, NP 11/09/2022 Active   insulin detemir (LEVEMIR FLEXPEN) 100 UNIT/ML FlexPen [id number]  INJECT 30 UNITS SUBCUTANEOUSLY AT BEDTIME [provider]  Active   Insulin Glargine Solostar (LANTUS) 100 UNIT/ML Solostar Pen [id number]  Inject 35 Units into the skin daily. Dani Gobble, NP  Active   lenalidomide (REVLIMID) 10 MG capsule [id number]  Take 1 capsule daily for 21 days, then take 7 days off Doreatha Massed, MD  Active   losartan (COZAAR) 25 MG tablet [id number] Yes Take 1 tablet by mouth once daily Doreatha Massed, MD 11/09/2022 Active   metoprolol tartrate (LOPRESSOR) 25 MG tablet [id number] Yes Take 1/2 (one-half) tablet by mouth twice daily Doreatha Massed, MD 11/09/2022 Active  Multiple Vitamin (MULTIVITAMIN) tablet [id number]  Take 1 tablet by mouth daily. [provider]  Active   rosuvastatin (CRESTOR) 20 MG tablet [id number] Yes Take 1 tablet by mouth daily. [provider] 11/09/2022 Active   Med List Note Silvestre Gunner, CPhT 03/07/13 1442): No  preferred pharmacy.             Home Care and Equipment/Supplies: Were Home Health Services Ordered?: No Any new equipment or medical supplies ordered?: No  Functional Questionnaire: Do you need assistance with bathing/showering or dressing?: No Do you need assistance with meal preparation?: No Do you need assistance with eating?: No Do you have difficulty maintaining continence: No Do you need assistance with getting out of bed/getting out of a chair/moving?: No Do you have difficulty managing or taking your medications?: No  Follow up appointments reviewed: PCP Follow-up appointment confirmed?: NA (ED visit only) Specialist Hospital Follow-up appointment confirmed?: No Do you need transportation to your follow-up appointment?: No Do you understand care options if your condition(s) worsen?: Yes-patient verbalized understanding   SDOH Screenings   Food Insecurity: No Food Insecurity (04/07/2021)  Housing: Low Risk  (06/08/2021)  Transportation Needs: No Transportation Needs (11/14/2022)  Alcohol Screen: Low Risk  (03/08/2021)  Depression (PHQ2-9): Low Risk  (06/30/2022)  Financial Resource Strain: Low Risk  (04/25/2022)  Physical Activity: Sufficiently Active (06/08/2021)  Social Connections: Socially Integrated (03/08/2021)  Stress: No Stress Concern Present (03/09/2020)  Tobacco Use: Low Risk  (11/10/2022)    [name], BSW, MSW, LCSW Managed Medicaid LCSW [facility]  [facility] [email or web address] Phone: [phone]

## 2022-11-16 ENCOUNTER — Inpatient Hospital Stay: Payer: Medicaid Other | Admitting: Hematology

## 2022-11-16 ENCOUNTER — Inpatient Hospital Stay: Payer: Medicaid Other | Attending: Hematology

## 2022-11-16 DIAGNOSIS — I10 Essential (primary) hypertension: Secondary | ICD-10-CM | POA: Insufficient documentation

## 2022-11-16 DIAGNOSIS — N179 Acute kidney failure, unspecified: Secondary | ICD-10-CM | POA: Insufficient documentation

## 2022-11-16 DIAGNOSIS — E109 Type 1 diabetes mellitus without complications: Secondary | ICD-10-CM | POA: Insufficient documentation

## 2022-11-16 DIAGNOSIS — C9000 Multiple myeloma not having achieved remission: Secondary | ICD-10-CM | POA: Insufficient documentation

## 2022-11-17 ENCOUNTER — Other Ambulatory Visit: Payer: Self-pay | Admitting: Hematology

## 2022-11-17 DIAGNOSIS — I10 Essential (primary) hypertension: Secondary | ICD-10-CM

## 2022-11-21 ENCOUNTER — Encounter (HOSPITAL_COMMUNITY): Payer: Self-pay | Admitting: Hematology

## 2022-11-24 ENCOUNTER — Other Ambulatory Visit: Payer: Self-pay

## 2022-11-24 ENCOUNTER — Inpatient Hospital Stay: Payer: Medicaid Other

## 2022-11-24 ENCOUNTER — Other Ambulatory Visit: Payer: Self-pay | Admitting: Hematology

## 2022-11-24 DIAGNOSIS — C9000 Multiple myeloma not having achieved remission: Secondary | ICD-10-CM | POA: Diagnosis not present

## 2022-11-24 DIAGNOSIS — E109 Type 1 diabetes mellitus without complications: Secondary | ICD-10-CM | POA: Diagnosis not present

## 2022-11-24 DIAGNOSIS — N179 Acute kidney failure, unspecified: Secondary | ICD-10-CM | POA: Diagnosis not present

## 2022-11-24 DIAGNOSIS — I10 Essential (primary) hypertension: Secondary | ICD-10-CM | POA: Diagnosis not present

## 2022-11-24 LAB — COMPREHENSIVE METABOLIC PANEL
ALT: 45 U/L — ABNORMAL HIGH (ref 0–44)
AST: 34 U/L (ref 15–41)
Albumin: 3.9 g/dL (ref 3.5–5.0)
Alkaline Phosphatase: 69 U/L (ref 38–126)
Anion gap: 10 (ref 5–15)
BUN: 13 mg/dL (ref 6–20)
CO2: 23 mmol/L (ref 22–32)
Calcium: 9.1 mg/dL (ref 8.9–10.3)
Chloride: 100 mmol/L (ref 98–111)
Creatinine, Ser: 0.83 mg/dL (ref 0.44–1.00)
GFR, Estimated: >60 mL/min (ref >60)
Glucose, Bld: 294 mg/dL — ABNORMAL HIGH (ref 70–99)
Potassium: 3.7 mmol/L (ref 3.5–5.1)
Sodium: 133 mmol/L — ABNORMAL LOW (ref 135–145)
Total Bilirubin: 0.5 mg/dL (ref 0.3–1.2)
Total Protein: 7.8 g/dL (ref 6.5–8.1)

## 2022-11-24 LAB — CBC WITH DIFFERENTIAL/PLATELET
Abs Immature Granulocytes: 0.01 10*3/uL (ref 0.00–0.07)
Basophils Absolute: 0 10*3/uL (ref 0.0–0.1)
Basophils Relative: 1 %
Eosinophils Absolute: 0.1 10*3/uL (ref 0.0–0.5)
Eosinophils Relative: 2 %
HCT: 40.8 % (ref 36.0–46.0)
Hemoglobin: 13.4 g/dL (ref 12.0–15.0)
Immature Granulocytes: 0 %
Lymphocytes Relative: 28 %
Lymphs Abs: 1 10*3/uL (ref 0.7–4.0)
MCH: 28.1 pg (ref 26.0–34.0)
MCHC: 32.8 g/dL (ref 30.0–36.0)
MCV: 85.5 fL (ref 80.0–100.0)
Monocytes Absolute: 0.5 10*3/uL (ref 0.1–1.0)
Monocytes Relative: 13 %
Neutro Abs: 2 10*3/uL (ref 1.7–7.7)
Neutrophils Relative %: 56 %
Platelets: 234 10*3/uL (ref 150–400)
RBC: 4.77 MIL/uL (ref 3.87–5.11)
RDW: 13.4 % (ref 11.5–15.5)
WBC: 3.5 10*3/uL — ABNORMAL LOW (ref 4.0–10.5)
nRBC: 0 % (ref 0.0–0.2)

## 2022-11-24 LAB — PREGNANCY, URINE: Preg Test, Ur: NEGATIVE

## 2022-11-24 LAB — MAGNESIUM: Magnesium: 1.8 mg/dL (ref 1.7–2.4)

## 2022-11-24 MED ORDER — LENALIDOMIDE 10 MG PO CAPS: ORAL_CAPSULE | ORAL | 0 refills | Status: DC

## 2022-11-24 NOTE — Telephone Encounter (Signed)
Chart reviewed. Revlimid refilled per last office note with Dr. Katragadda.  

## 2022-11-25 LAB — KAPPA/LAMBDA LIGHT CHAINS
Kappa free light chain: 13.4 mg/L (ref 3.3–19.4)
Kappa, lambda light chain ratio: 1.38 (ref 0.26–1.65)
Lambda free light chains: 9.7 mg/L (ref 5.7–26.3)

## 2022-11-28 LAB — PROTEIN ELECTROPHORESIS, SERUM
A/G Ratio: 1 (ref 0.7–1.7)
Albumin ELP: 3.6 g/dL (ref 2.9–4.4)
Alpha-1-Globulin: 0.2 g/dL (ref 0.0–0.4)
Alpha-2-Globulin: 0.8 g/dL (ref 0.4–1.0)
Beta Globulin: 1.4 g/dL — ABNORMAL HIGH (ref 0.7–1.3)
Gamma Globulin: 1.3 g/dL (ref 0.4–1.8)
Globulin, Total: 3.7 g/dL (ref 2.2–3.9)
Total Protein ELP: 7.3 g/dL (ref 6.0–8.5)

## 2022-11-28 LAB — IMMUNOFIXATION ELECTROPHORESIS
IgA: 113 mg/dL (ref 87–352)
IgG (Immunoglobin G), Serum: 1261 mg/dL (ref 586–1602)
IgM (Immunoglobulin M), Srm: 28 mg/dL (ref 26–217)
Total Protein ELP: 7.2 g/dL (ref 6.0–8.5)
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
## 2022-11-29 ENCOUNTER — Inpatient Hospital Stay: Payer: Medicaid Other

## 2022-11-29 ENCOUNTER — Encounter: Payer: Self-pay | Admitting: Hematology

## 2022-11-29 ENCOUNTER — Inpatient Hospital Stay (HOSPITAL_BASED_OUTPATIENT_CLINIC_OR_DEPARTMENT_OTHER): Payer: Medicaid Other | Admitting: Hematology

## 2022-11-29 VITALS — BP 151/111 | HR 85 | Temp 97.5°F | Resp 18 | Wt 181.7 lb

## 2022-11-29 VITALS — BP 146/103 | HR 76 | Resp 18

## 2022-11-29 DIAGNOSIS — C9000 Multiple myeloma not having achieved remission: Secondary | ICD-10-CM | POA: Diagnosis not present

## 2022-11-29 DIAGNOSIS — N179 Acute kidney failure, unspecified: Secondary | ICD-10-CM | POA: Diagnosis not present

## 2022-11-29 DIAGNOSIS — C9001 Multiple myeloma in remission: Secondary | ICD-10-CM | POA: Diagnosis not present

## 2022-11-29 DIAGNOSIS — E109 Type 1 diabetes mellitus without complications: Secondary | ICD-10-CM | POA: Diagnosis not present

## 2022-11-29 DIAGNOSIS — I10 Essential (primary) hypertension: Secondary | ICD-10-CM | POA: Diagnosis not present

## 2022-11-29 MED ORDER — ROSUVASTATIN CALCIUM 20 MG PO TABS: 20.0000 mg | ORAL_TABLET | Freq: Every day | ORAL | 6 refills | Status: DC

## 2022-11-29 MED ORDER — ZOLEDRONIC ACID 4 MG/100ML IV SOLN
4.0000 mg | Freq: Once | INTRAVENOUS | Status: AC
  Administered 2022-11-29: 4 mg via INTRAVENOUS
  Filled 2022-11-29: qty 100

## 2022-11-29 MED ORDER — SODIUM CHLORIDE 0.9 % IV SOLN: Freq: Once | INTRAVENOUS | Status: AC

## 2022-11-29 NOTE — Progress Notes (Addendum)
Patient presents today for immunotherapy/supportive therapy infusion of Zometa. Patient is in satisfactory condition with no new complaints voiced.  Vital signs are stable with exception of blood pressure 151/111. Dr Ellin Saba aware. Labs reviewed by Dr. Ellin Saba during the office visit and all labs are within treatment parameters.  We will proceed with treatment per MD orders.   Zometa treatment has preauthorizing warning. Merla Riches notified. No PA needed per Delice Bison green. Will continue with treatment.   Patient tolerated treatment well with no complaints voiced.  Patient left ambulatory in stable condition.  Vital signs stable at discharge.  Follow up as scheduled.

## 2022-11-29 NOTE — Patient Instructions (Signed)
[facility]  Discharge Instructions: Thank you for choosing [facility] to provide your oncology and hematology care.  If you have a lab appointment with the Cancer Center - please note that after April 8th, 2024, all labs will be drawn in the cancer center.  You do not have to check in or register with the main entrance as you have in the past but will complete your check-in in the cancer center.  Wear comfortable clothing and clothing appropriate for easy access to any Portacath or PICC line.   We strive to give you quality time with your provider. You may need to reschedule your appointment if you arrive late (15 or more minutes).  Arriving late affects you and other patients whose appointments are after yours.  Also, if you miss three or more appointments without notifying the office, you may be dismissed from the clinic at the provider's discretion.      For prescription refill requests, have your pharmacy contact our office and allow 72 hours for refills to be completed.    Today you received the following chemotherapy and/or immunotherapy agents Zometa.  Zoledronic Acid Injection (Cancer) What is this medication? ZOLEDRONIC ACID (ZOE le dron ik AS id) treats high calcium levels in the blood caused by cancer. It may also be used with chemotherapy to treat weakened bones caused by cancer. It works by slowing down the release of calcium from bones. This lowers calcium levels in your blood. It also makes your bones stronger and less likely to break (fracture). It belongs to a group of medications called bisphosphonates. This medicine may be used for other purposes; ask your health care provider or pharmacist if you have questions. COMMON BRAND NAME(S): Zometa, Zometa Powder What should I tell my care team before I take this medication? They need to know if you have any of these conditions: Dehydration Dental disease Kidney disease Liver disease Low levels  of calcium in the blood Lung or breathing disease, such as asthma Receiving steroids, such as dexamethasone or prednisone An unusual or allergic reaction to zoledronic acid, other medications, foods, dyes, or preservatives Pregnant or trying to get pregnant Breast-feeding How should I use this medication? This medication is injected into a vein. It is given by your care team in a hospital or clinic setting. Talk to your care team about the use of this medication in children. Special care may be needed. Overdosage: If you think you have taken too much of this medicine contact a poison control center or emergency room at once. NOTE: This medicine is only for you. Do not share this medicine with others. What if I miss a dose? Keep appointments for follow-up doses. It is important not to miss your dose. Call your care team if you are unable to keep an appointment. What may interact with this medication? Certain antibiotics given by injection Diuretics, such as bumetanide, furosemide NSAIDs, medications for pain and inflammation, such as ibuprofen or naproxen Teriparatide Thalidomide This list may not describe all possible interactions. Give your health care provider a list of all the medicines, herbs, non-prescription drugs, or dietary supplements you use. Also tell them if you smoke, drink alcohol, or use illegal drugs. Some items may interact with your medicine. What should I watch for while using this medication? Visit your care team for regular checks on your progress. It may be some time before you see the benefit from this medication. Some people who take this medication have   severe bone, joint, or muscle pain. This medication may also increase your risk for jaw problems or a broken thigh bone. Tell your care team right away if you have severe pain in your jaw, bones, joints, or muscles. Tell you care team if you have any pain that does not go away or that gets worse. Tell your dentist and  dental surgeon that you are taking this medication. You should not have major dental surgery while on this medication. See your dentist to have a dental exam and fix any dental problems before starting this medication. Take good care of your teeth while on this medication. Make sure you see your dentist for regular follow-up appointments. You should make sure you get enough calcium and vitamin D while you are taking this medication. Discuss the foods you eat and the vitamins you take with your care team. Check with your care team if you have severe diarrhea, nausea, and vomiting, or if you sweat a lot. The loss of too much body fluid may make it dangerous for you to take this medication. You may need bloodwork while taking this medication. Talk to your care team if you wish to become pregnant or think you might be pregnant. This medication can cause serious birth defects. What side effects may I notice from receiving this medication? Side effects that you should report to your care team as soon as possible: Allergic reactions--skin rash, itching, hives, swelling of the face, lips, tongue, or throat Kidney injury--decrease in the amount of urine, swelling of the ankles, hands, or feet Low calcium level--muscle pain or cramps, confusion, tingling, or numbness in the hands or feet Osteonecrosis of the jaw--pain, swelling, or redness in the mouth, numbness of the jaw, poor healing after dental work, unusual discharge from the mouth, visible bones in the mouth Severe bone, joint, or muscle pain Side effects that usually do not require medical attention (report to your care team if they continue or are bothersome): Constipation Fatigue Fever Loss of appetite Nausea Stomach pain This list may not describe all possible side effects. Call your doctor for medical advice about side effects. You may report side effects to FDA at 1-800-FDA-1088. Where should I keep my medication? This medication is given in  a hospital or clinic. It will not be stored at home. NOTE: This sheet is a summary. It may not cover all possible information. If you have questions about this medicine, talk to your doctor, pharmacist, or health care provider.  2024 Elsevier/Gold Standard (2021-06-25 00:00:00)        To help prevent nausea and vomiting after your treatment, we encourage you to take your nausea medication as directed.  BELOW ARE SYMPTOMS THAT SHOULD BE REPORTED IMMEDIATELY: *FEVER GREATER THAN 100.4 F (38 C) OR HIGHER *CHILLS OR SWEATING *NAUSEA AND VOMITING THAT IS NOT CONTROLLED WITH YOUR NAUSEA MEDICATION *UNUSUAL SHORTNESS OF BREATH *UNUSUAL BRUISING OR BLEEDING *URINARY PROBLEMS (pain or burning when urinating, or frequent urination) *BOWEL PROBLEMS (unusual diarrhea, constipation, pain near the anus) TENDERNESS IN MOUTH AND THROAT WITH OR WITHOUT PRESENCE OF ULCERS (sore throat, sores in mouth, or a toothache) UNUSUAL RASH, SWELLING OR PAIN  UNUSUAL VAGINAL DISCHARGE OR ITCHING   Items with * indicate a potential emergency and should be followed up as soon as possible or go to the Emergency Department if any problems should occur.  Please show the CHEMOTHERAPY ALERT CARD or IMMUNOTHERAPY ALERT CARD at check-in to the Emergency Department and triage nurse.  Should you have   questions after your visit or need to cancel or reschedule your appointment, please contact [facility] [phone]  and follow the prompts.  Office hours are 8:00 a.m. to 4:30 p.m. Monday - Friday. Please note that voicemails left after 4:00 p.m. may not be returned until the following business day.  We are closed weekends and major holidays. You have access to a nurse at all times for urgent questions. Please call the main number to the clinic [phone] and follow the prompts.  For any non-urgent questions, you may also contact your provider using MyChart. We now offer e-Visits for anyone 18 and older to  request care online for non-urgent symptoms. For details visit [email or web address].   Also download the MyChart app! Go to the app store, search "MyChart", open the app, select [facility], and log in with your MyChart username and password.   

## 2022-11-29 NOTE — Patient Instructions (Signed)
[facility] at [facility] Discharge Instructions   You were seen and examined today by Dr. Ellin Saba.  He reviewed the results of your lab work which are normal/stable.   We will proceed with your Zometa infusion today.   We will see you back in 3 months. We will repeat lab work one week prior to this visit.   Return as scheduled.    Thank you for choosing [facility] at [facility] to provide your oncology and hematology care.  To afford each patient quality time with our provider, please arrive at least 15 minutes before your scheduled appointment time.   If you have a lab appointment with the Cancer Center please come in thru the Main Entrance and check in at the main information desk.  You need to re-schedule your appointment should you arrive 10 or more minutes late.  We strive to give you quality time with our providers, and arriving late affects you and other patients whose appointments are after yours.  Also, if you no show three or more times for appointments you may be dismissed from the clinic at the providers discretion.     Again, thank you for choosing [facility].  Our hope is that these requests will decrease the amount of time that you wait before being seen by our physicians.       _____________________________________________________________  Should you have questions after your visit to [facility], please contact our office at [phone] and follow the prompts.  Our office hours are 8:00 a.m. and 4:30 p.m. Monday - Friday.  Please note that voicemails left after 4:00 p.m. may not be returned until the following business day.  We are closed weekends and major holidays.  You do have access to a nurse 24-7, just call the main number to the clinic [phone] and do not press any options, hold on the line and a nurse will answer the phone.    For prescription refill requests, have your pharmacy  contact our office and allow 72 hours.    Due to Covid, you will need to wear a mask upon entering the hospital. If you do not have a mask, a mask will be given to you at the Main Entrance upon arrival. For doctor visits, patients may have 1 support person age 60 or older with them. For treatment visits, patients can not have anyone with them due to social distancing guidelines and our immunocompromised population.

## 2022-12-14 ENCOUNTER — Other Ambulatory Visit: Payer: Self-pay | Admitting: Hematology

## 2022-12-15 DIAGNOSIS — Z419 Encounter for procedure for purposes other than remedying health state, unspecified: Secondary | ICD-10-CM | POA: Diagnosis not present

## 2022-12-28 ENCOUNTER — Inpatient Hospital Stay: Payer: Medicaid Other | Attending: Hematology

## 2022-12-28 ENCOUNTER — Other Ambulatory Visit: Payer: Self-pay

## 2022-12-28 DIAGNOSIS — Z3201 Encounter for pregnancy test, result positive: Secondary | ICD-10-CM

## 2022-12-28 DIAGNOSIS — C9000 Multiple myeloma not having achieved remission: Secondary | ICD-10-CM | POA: Insufficient documentation

## 2022-12-28 DIAGNOSIS — C9001 Multiple myeloma in remission: Secondary | ICD-10-CM

## 2022-12-28 LAB — PREGNANCY, URINE: Preg Test, Ur: POSITIVE — AB

## 2022-12-29 ENCOUNTER — Other Ambulatory Visit: Payer: Medicaid Other

## 2022-12-29 DIAGNOSIS — Z789 Other specified health status: Secondary | ICD-10-CM

## 2022-12-30 ENCOUNTER — Ambulatory Visit: Payer: Medicaid Other | Admitting: Family Medicine

## 2022-12-30 ENCOUNTER — Encounter: Payer: Self-pay | Admitting: Family Medicine

## 2022-12-30 VITALS — BP 134/91 | HR 98 | Temp 98.2°F | Wt 183.6 lb

## 2022-12-30 DIAGNOSIS — E1059 Type 1 diabetes mellitus with other circulatory complications: Secondary | ICD-10-CM

## 2022-12-30 DIAGNOSIS — I152 Hypertension secondary to endocrine disorders: Secondary | ICD-10-CM | POA: Diagnosis not present

## 2022-12-30 DIAGNOSIS — E1065 Type 1 diabetes mellitus with hyperglycemia: Secondary | ICD-10-CM | POA: Diagnosis not present

## 2022-12-30 DIAGNOSIS — Z3490 Encounter for supervision of normal pregnancy, unspecified, unspecified trimester: Secondary | ICD-10-CM

## 2022-12-30 DIAGNOSIS — C9001 Multiple myeloma in remission: Secondary | ICD-10-CM

## 2022-12-30 DIAGNOSIS — E1069 Type 1 diabetes mellitus with other specified complication: Secondary | ICD-10-CM

## 2022-12-30 DIAGNOSIS — E785 Hyperlipidemia, unspecified: Secondary | ICD-10-CM | POA: Diagnosis not present

## 2022-12-30 LAB — BETA HCG QUANT (REF LAB): hCG Quant: 883 m[IU]/mL

## 2022-12-30 MED ORDER — LABETALOL HCL 100 MG PO TABS: 100.0000 mg | ORAL_TABLET | Freq: Two times a day (BID) | ORAL | 3 refills | Status: DC

## 2022-12-30 MED ORDER — NIFEDIPINE ER OSMOTIC RELEASE 30 MG PO TB24
30.0000 mg | ORAL_TABLET | Freq: Every day | ORAL | 3 refills | Status: DC
Start: 2022-12-30 — End: 2023-08-17

## 2022-12-30 NOTE — Progress Notes (Signed)
Established Patient Office Visit  Subjective   Patient ID: Diamond Robbins, female    DOB: 02-20-1991  Age: 32 y.o. MRN: [id number]  Chief Complaint  Patient presents with   Medical Management of Chronic Issues    HPI Diamond Robbins is here for a chronic follow up. She recently found out that she is pregnant. She had labs down yesterday at an Ob office. HCG was consist with [redacted] weeks gestation. Her LMP was in February. She has irregular cycles due to medications. Because of this, she has been taking montly pregnancy test. She had a documented negative pregnancy test on 11/24/22. She has not yet seen an OB provider. She reports fatigue and urinary frequency. Denies nausea, vomiting, vaginal bleeding, abdominal pain. Hx of miscarriage. She has stopped her cancer treatment. Currently in remission.   She has been checking blood sugars. These have been around 160. Managed by endo. Reports that she has an upcoming appt with endo.      ROS As per HPI.    Objective:     BP (!) 134/91   Pulse 98   Temp 98.2 F (36.8 C) (Temporal)   SpO2 100%    Physical Exam Vitals and nursing note reviewed.  Constitutional:      General: She is not in acute distress.    Appearance: Normal appearance. She is not ill-appearing.  Cardiovascular:     Rate and Rhythm: Normal rate and regular rhythm.     Pulses: Normal pulses.     Heart sounds: Normal heart sounds. No murmur heard. Pulmonary:     Effort: Pulmonary effort is normal. No respiratory distress.     Breath sounds: Normal breath sounds.  Musculoskeletal:     Cervical back: Neck supple. No tenderness.     Right lower leg: No edema.     Left lower leg: No edema.  Lymphadenopathy:     Cervical: No cervical adenopathy.  Skin:    General: Skin is warm and dry.  Neurological:     General: No focal deficit present.     Mental Status: She is alert and oriented to person, place, and time.  Psychiatric:        Mood and Affect: Mood normal.         Behavior: Behavior normal.      No results found for any visits on 12/30/22.    The ASCVD Risk score (Arnett DK, et al., 2019) failed to calculate for the following reasons:   The 2019 ASCVD risk score is only valid for ages 76 to 37    Assessment & Plan:   Diamond Robbins was seen today for medical management of chronic issues.  Diagnoses and all orders for this visit:  Hypertension associated with type 1 diabetes mellitus (HCC) BP not at goal. Discontinue losartan, metoprolol, and amlodipine due to current pregnancy. Start nifedipine and labetalol as below. Will have her follow up in 2 weeks for recheck. Monitor BP at home.  -     NIFEdipine (PROCARDIA-XL/NIFEDICAL-XL) 30 MG 24 hr tablet; Take 1 tablet (30 mg total) by mouth daily. -     labetalol (NORMODYNE) 100 MG tablet; Take 1 tablet (100 mg total) by mouth 2 (two) times daily.  Hyperlipidemia due to type 1 diabetes mellitus (HCC) Discontinue statin due to pregnancy. Heart healthy diet.   Type 1 diabetes mellitus with hyperglycemia (HCC) Managed by endo. Discussed to notify endo of pregnancy and follow up as instructed.   Multiple myeloma in remission (HCC) Stopped medication  due to pregnancy.  Pregnancy at early stage Established with OB. Discussed that Ob should set her up with high risk OB given her chronic conditions.  -     NIFEdipine (PROCARDIA-XL/NIFEDICAL-XL) 30 MG 24 hr tablet; Take 1 tablet (30 mg total) by mouth daily. -     labetalol (NORMODYNE) 100 MG tablet; Take 1 tablet (100 mg total) by mouth 2 (two) times daily.   Follow up in 2 weeks, sooner for new or worsening symptoms.   The patient indicates understanding of these issues and agrees with the plan.  Gabriel Earing, FNP

## 2023-01-03 ENCOUNTER — Encounter: Payer: Self-pay | Admitting: Family Medicine

## 2023-01-13 ENCOUNTER — Other Ambulatory Visit: Payer: Self-pay | Admitting: Nurse Practitioner

## 2023-01-13 ENCOUNTER — Ambulatory Visit: Payer: Medicaid Other | Admitting: Family Medicine

## 2023-01-15 DIAGNOSIS — Z419 Encounter for procedure for purposes other than remedying health state, unspecified: Secondary | ICD-10-CM | POA: Diagnosis not present

## 2023-01-17 ENCOUNTER — Encounter: Payer: Self-pay | Admitting: Family Medicine

## 2023-01-23 ENCOUNTER — Other Ambulatory Visit: Payer: Self-pay | Admitting: Obstetrics & Gynecology

## 2023-01-23 DIAGNOSIS — O3680X0 Pregnancy with inconclusive fetal viability, not applicable or unspecified: Secondary | ICD-10-CM

## 2023-01-24 ENCOUNTER — Encounter (HOSPITAL_COMMUNITY): Payer: Self-pay | Admitting: Hematology

## 2023-01-24 ENCOUNTER — Ambulatory Visit (INDEPENDENT_AMBULATORY_CARE_PROVIDER_SITE_OTHER): Payer: Medicaid Other

## 2023-01-24 DIAGNOSIS — O3680X0 Pregnancy with inconclusive fetal viability, not applicable or unspecified: Secondary | ICD-10-CM

## 2023-01-24 DIAGNOSIS — Z3481 Encounter for supervision of other normal pregnancy, first trimester: Secondary | ICD-10-CM | POA: Diagnosis not present

## 2023-01-24 DIAGNOSIS — Z3A08 8 weeks gestation of pregnancy: Secondary | ICD-10-CM

## 2023-01-24 NOTE — Patient Instructions (Signed)
Type 1 or Type 2 Diabetes Mellitus During Pregnancy, Self Care Pregnancy causes changes in your body that can affect your diabetes. When you have type 1 or type 2 diabetes (diabetes mellitus), you must keep your blood sugar (glucose) in a healthy range. You can do this with: Healthy eating. Exercise. Insulin or medicines, if needed. Your doctor may need to add medicines or increase your doses while you are pregnant. Keeping your blood sugar under control will help keep you and your baby healthy. How does having diabetes during pregnancy affect me? Having diabetes can increase your risk of other conditions, such as: High blood pressure during pregnancy (preeclampsia). Heart disease. Kidney disease. How does this affect the baby? Diabetes can be harmful to an unborn baby. Some problems include: Risk for pregnancy loss and stillbirth. Problems with the organ that gives oxygen and nutrition to the baby (placenta). High birth weight. Diabetes can also affect a baby after birth. Some problems include: Low blood sugar. Breathing problems. What are blood sugar goals during pregnancy? Your doctor will set treatment goals for you. In general, you should stay within these blood sugar levels: After not eating (fasting) for 8 hours: 70 to 95 mg/dL (3.9 to 5.3 mmol/L). After meals: One hour after a meal: 110 to 140 mg/dL (6.1 to 7.8 mmol/L). Two hours after a meal: 100 to 120 mg/dL (5.6 to 6.7 mmol/L). HbA1C level: less than 6%. After 16 weeks of pregnancy, you may need more insulin to stay in the goal ranges. Or you may need insulin if you have not needed it before. Be sure to: Check your blood sugar as often as told. Call your doctor if your blood sugar is above your goal numbers for 2 tests in a row. Why do I have a low blood sugar? Low blood sugar (hypoglycemia) can happen if: You do not eat enough food. You skip a meal. You do not eat at the right time of day. You exercise too much. If you  have type 1 diabetes, you have a higher risk of having alow blood sugar earlier in your pregnancy. You may not feel the symptoms of low blood sugar the same as you did before you were pregnant. Symptoms include: Feeling dizzy Feeling shaky. Sweating. Weakness. Follow these instructions at home: Medicines Take over-the-counter and prescription medicines only as told by your doctor. Do not run out of insulin or other medicines you take. Plan ahead to make sure you always have some. Your doctor may tell you to take one low-dose aspirin (81 mg) each day to help prevent high blood pressure while you are pregnant. Eating and drinking What you eat and drink affects your blood sugar and your insulin dosage. A healthy meal plan includes: Chicken, fish, egg whites, and beans. Oats, whole wheat, and brown rice. Fresh fruits and vegetables. Low-fat dairy products. Healthy fats, such as those from nuts, seeds, and fish. Meet with a food expert (dietitian). They can help you make an eating plan that is right for you. Make sure you: Follow instructions about what you may eat and drink. Drink enough fluid to keep your pee (urine) pale yellow. Eat healthy snacks between meals. Keep track of the carbs you eat. Do this by reading food labels and learning food serving sizes. Follow your sick-day plan when you cannot eat or drink as usual. Make this plan early so it is ready to use.  Activity Exercise for 30 minutes or more each day or as much as told by  your doctor. Talk with your doctor before you start a new exercise or activity. You may need to make changes to: Your insulin or medicines. How much food you eat. Care for your body Stay at a healthy weight while you are pregnant. Get an eye exam during the first 3 months that you are pregnant, or as told. Take good care of your teeth by: Brushing your teeth and gums two times a day. Flossing one or more times a day. Going to the dentist one or more  times every 6 months. General instructions Talk with your doctor about your risk for high blood pressure during pregnancy. Share your diabetes care plan with: People at your job or school. People you live with. Check your pee for ketones when you are sick and as told. Wear an alert bracelet or carry a card that says you have diabetes. Keep all follow-up visits. Your doctor will adjust medicines as needed and check on you and your baby's health. Where to find more information American Diabetes Association: diabetes.org [employer] (ACOG): acog.org Contact a doctor if: Your blood sugar is at or above 240 mg/dL (20.2 mmol/L). You have been sick or have had a fever for 2 days or more and you are not getting better. You have any of these problems for more than 6 hours: You cannot eat or drink. You feel like you may vomit and you vomit. You have watery poop. Get help right away if: You have very low blood sugar (severe hypoglycemia). This means your blood sugar is below 54 mg/dL (3 mmol/L). You get mixed up (confused). You have trouble breathing. You have chest pain. Your baby moves less than normal. You have cramping in your belly or have pain in your pelvis or lower back. You have symptoms of preeclampsia. These include: A very bad headache that does not go away. Changes in how you see, such as blurred or double vision, light sensitivity, or seeing spots in front of your eyes. Sudden or very bad swelling of your face, hands, legs, or feet. These symptoms may be an emergency. Get help right away. Call 911. Do not wait to see if the symptoms will go away. Do not drive yourself to the hospital. This information is not intended to replace advice given to you by your health care provider. Make sure you discuss any questions you have with your health care provider. Document Revised: 12/08/2021 Document Reviewed: 12/08/2021 Elsevier Patient Education   2024 [employer].

## 2023-01-24 NOTE — Progress Notes (Signed)
[place] 8+1 wks,single IUP with yolk sac,normal ovaries,FHR 164 bpm,anterior subserosal fibroid 2.3 x 1.2 x 2 cm,posterior subserosal fibroid 2.6 x 1.9 x 3.4 cm,CRL 15.63 mm

## 2023-01-25 ENCOUNTER — Ambulatory Visit (INDEPENDENT_AMBULATORY_CARE_PROVIDER_SITE_OTHER): Payer: Medicaid Other | Admitting: Nurse Practitioner

## 2023-01-25 ENCOUNTER — Encounter: Payer: Self-pay | Admitting: Nurse Practitioner

## 2023-01-25 VITALS — BP 146/100 | HR 72 | Ht 63.0 in | Wt 183.6 lb

## 2023-01-25 DIAGNOSIS — E782 Mixed hyperlipidemia: Secondary | ICD-10-CM

## 2023-01-25 DIAGNOSIS — E1065 Type 1 diabetes mellitus with hyperglycemia: Secondary | ICD-10-CM

## 2023-01-25 DIAGNOSIS — Z794 Long term (current) use of insulin: Secondary | ICD-10-CM

## 2023-01-25 DIAGNOSIS — I10 Essential (primary) hypertension: Secondary | ICD-10-CM

## 2023-01-25 LAB — POCT GLYCOSYLATED HEMOGLOBIN (HGB A1C): Hemoglobin A1C: 8.5 % — AB (ref 4.0–5.6)

## 2023-01-25 MED ORDER — OMNIPOD 5 DEXG7G6 PODS GEN 5 MISC: 6 refills | Status: DC

## 2023-01-25 MED ORDER — OMNIPOD 5 DEXG7G6 INTRO GEN 5 KIT: PACK | 0 refills | Status: DC

## 2023-01-25 NOTE — Progress Notes (Signed)
Endocrinology Follow Up Note       01/25/2023, 11:54 AM   Subjective:    Patient ID: Diamond Robbins, female    DOB: 1990-11-12.  Diamond Robbins is being seen in follow up after being seen in consultation for management of currently uncontrolled symptomatic diabetes requested by  Gabriel Earing, FNP.   Past Medical History:  Diagnosis Date   Cancer (HCC) 08/01/2020   multiple myeloma   DKA (diabetic ketoacidosis) (HCC) 02/23/2020   Hypertension    Type 1 diabetes mellitus (HCC) 02/23/2020    Past Surgical History:  Procedure Laterality Date   BONE MARROW TRANSPLANT     NO PAST SURGERIES      Social History   Socioeconomic History   Marital status: Married    Spouse name: Rolm Gala   Number of children: Not on file   Years of education: 12   Highest education level: High school graduate  Occupational History   Occupation: unemployed    Comment: can go back to work Jan 2023  Tobacco Use   Smoking status: Never    Passive exposure: Never   Smokeless tobacco: Never  Vaping Use   Vaping status: Never Used  Substance and Sexual Activity   Alcohol use: Not Currently    Comment: occ- 0-1 drinks per week; maybe 1 drink every 6 months   Drug use: No   Sexual activity: Yes    Birth control/protection: Condom  Other Topics Concern   Not on file  Social History Narrative   Has 2 step-daughters   Social Determinants of Health   Financial Resource Strain: Low Risk  (04/25/2022)   Overall Financial Resource Strain (CARDIA)    Difficulty of Paying Living Expenses: Not hard at all  Food Insecurity: No Food Insecurity (04/07/2021)   Hunger Vital Sign    Worried About Running Out of Food in the Last Year: Never true    Ran Out of Food in the Last Year: Never true  Transportation Needs: No Transportation Needs (11/14/2022)   PRAPARE - Administrator, Civil Service (Medical): No    Lack of  Transportation (Non-Medical): No  Physical Activity: Sufficiently Active (06/08/2021)   Exercise Vital Sign    Days of Exercise per Week: 3 days    Minutes of Exercise per Session: 60 min  Stress: No Stress Concern Present (03/09/2020)   [employer] of Occupational Health - Occupational Stress Questionnaire    Feeling of Stress : Not at all  Social Connections: Socially Integrated (03/08/2021)   Social Connection and Isolation Panel [NHANES]    Frequency of Communication with Friends and Family: More than three times a week    Frequency of Social Gatherings with Friends and Family: More than three times a week    Attends Religious Services: More than 4 times per year    Active Member of [employer] or Organizations: Yes    Attends Banker Meetings: Never    Marital Status: Married    Family History  Problem Relation Age of Onset   Hypertension Mother    Diabetes Father    Hypertension Father    Stroke Maternal Grandmother    Hypertension Maternal  Grandmother    Diabetes Maternal Grandmother    Hypertension Maternal Grandfather    Heart attack Maternal Grandfather    Hypertension Paternal Grandmother    Hypertension Paternal Grandfather     Outpatient Encounter Medications as of 01/25/2023  Medication Sig   ACCU-CHEK GUIDE test strip Use to check blood glucose 4 times daily.   Accu-Chek Softclix Lancets lancets Use to check blood glucose 4 times daily.   Blood Glucose Monitoring Suppl (ACCU-CHEK GUIDE) w/Device KIT    Blood Pressure Monitor KIT Use to check blood pressure as instructed   calcium carbonate (OS-CAL) 1250 (500 Ca) MG chewable tablet Chew 1 tablet by mouth daily.   cetirizine (ZYRTEC) 10 MG tablet Take 1 tablet by mouth once daily   cholecalciferol (VITAMIN D3) 25 MCG (1000 UNIT) tablet Take 1,000 Units by mouth daily.   Continuous Glucose Sensor (DEXCOM G6 SENSOR) MISC CHANGE SENSOR EVERY 10 DAYS AS DIRECTED   Continuous Glucose Transmitter (DEXCOM G6  TRANSMITTER) MISC CHANGE TRANSMITTER EVERY 90 DAYS AS DIRECTED   fluticasone (FLONASE) 50 MCG/ACT nasal spray Place 2 sprays into both nostrils daily.   insulin aspart (NOVOLOG FLEXPEN) 100 UNIT/ML FlexPen Inject 6-12 Units into the skin 3 (three) times daily with meals.   Insulin Disposable Pump (OMNIPOD 5 G6 INTRO, GEN 5,) KIT Change pod every 48-72 hours   Insulin Disposable Pump (OMNIPOD 5 G6 PODS, GEN 5,) MISC Change pod every 48-72 hours   insulin glargine-yfgn (SEMGLEE) 100 UNIT/ML Pen INJECT 35 UNITS SUBCUTANEOUSLY ONCE DAILY   labetalol (NORMODYNE) 100 MG tablet Take 1 tablet (100 mg total) by mouth 2 (two) times daily.   Multiple Vitamin (MULTIVITAMIN) tablet Take 1 tablet by mouth daily.   NIFEdipine (PROCARDIA-XL/NIFEDICAL-XL) 30 MG 24 hr tablet Take 1 tablet (30 mg total) by mouth daily.   No facility-administered encounter medications on file as of 01/25/2023.    ALLERGIES: No Known Allergies  VACCINATION STATUS: Immunization History  Administered Date(s) Administered   DTaP / HiB / IPV 03/24/2021   DTaP / IPV 07/16/2021, 09/16/2021   HIB (PRP-T) 07/16/2021, 09/16/2021   Hepatitis B 03/24/2021   Hepatitis B, ADULT 03/24/2021, 07/16/2021, 12/01/2021   Influenza,inj,Quad PF,6+ Mos 03/16/2021   Pneumococcal Conjugate-13 03/24/2021, 07/16/2021, 09/16/2021   Pneumococcal Polysaccharide-23 12/01/2021    Diabetes She presents for her follow-up diabetic visit. She has type 1 diabetes mellitus. Onset time: Diagnosed at approx age of 59. Her disease course has been stable. There are no hypoglycemic associated symptoms. Associated symptoms include blurred vision and fatigue. Pertinent negatives for diabetes include no polydipsia, no polyuria and no weight loss. There are no hypoglycemic complications. Symptoms are improving. There are no diabetic complications. Risk factors for coronary artery disease include diabetes mellitus, dyslipidemia, hypertension and sedentary lifestyle.  Current diabetic treatment includes intensive insulin program. She is compliant with treatment most of the time. Her weight is fluctuating minimally. She is following a generally healthy diet. When asked about meal planning, she reported none. She has not had a previous visit with a dietitian. She participates in exercise intermittently. Her home blood glucose trend is decreasing steadily. Her overall blood glucose range is 180-200 mg/dl. (She presents today with her CGM showing improving glycemic profile overall.  Her POCT A1c today is 8.5%, increasing from last visit of 8.2%.  She recently found out she is [redacted] weeks pregnant (has history of miscarriage in the past).  Analysis of her CGM shows TIR 53%, TAR 47%, TBR <1% with a GMI of 7.7%.  )  An ACE inhibitor/angiotensin II receptor blocker is not being taken. She does not see a podiatrist.Eye exam is current.  Hypertension This is a chronic problem. The current episode started more than 1 year ago. The problem has been resolved since onset. The problem is controlled. Associated symptoms include blurred vision. There are no associated agents to hypertension. Risk factors for coronary artery disease include diabetes mellitus, dyslipidemia, family history and sedentary lifestyle. Past treatments include beta blockers and calcium channel blockers. The current treatment provides moderate improvement. Compliance problems include exercise and diet.   Hyperlipidemia This is a chronic problem. The current episode started more than 1 year ago. The problem is controlled. Recent lipid tests were reviewed and are normal. Exacerbating diseases include diabetes and obesity. Factors aggravating her hyperlipidemia include beta blockers and fatty foods. Current antihyperlipidemic treatment includes statins. The current treatment provides moderate improvement of lipids. Compliance problems include adherence to diet and adherence to exercise.  Risk factors for coronary artery  disease include diabetes mellitus, dyslipidemia, family history, female sex, hypertension and a sedentary lifestyle.    Review of systems  Constitutional: + Minimally fluctuating body weight,  current Body mass index is 32.52 kg/m. , no fatigue, no subjective hyperthermia, no subjective hypothermia Eyes: no blurry vision, no xerophthalmia ENT: no sore throat, no nodules palpated in throat, no dysphagia/odynophagia, no hoarseness Cardiovascular: no chest pain, no shortness of breath, no palpitations, no leg swelling Respiratory: no cough, no shortness of breath Gastrointestinal: no nausea/vomiting/diarrhea Musculoskeletal: no muscle/joint aches Skin: no rashes, no hyperemia Neurological: no tremors, no numbness, no tingling, no dizziness Psychiatric: no depression, no anxiety  Objective:     BP (!) 146/100 (BP Location: Right Arm, Patient Position: Sitting, Cuff Size: Large) Comment: Retake Manuel cuff  Pulse 72   Ht 5\' 3"  (1.6 m)   Wt 183 lb 9.6 oz (83.3 kg)   LMP 11/28/2022   BMI 32.52 kg/m   Wt Readings from Last 3 Encounters:  01/25/23 183 lb 9.6 oz (83.3 kg)  12/30/22 183 lb 9.6 oz (83.3 kg)  11/29/22 181 lb 11.2 oz (82.4 kg)     BP Readings from Last 3 Encounters:  01/25/23 (!) 146/100  12/30/22 (!) 134/91  11/29/22 (!) 146/103      Physical Exam- Limited  Constitutional:  Body mass index is 32.52 kg/m. , not in acute distress, normal state of mind Eyes:  EOMI, no exophthalmos Neck: Supple Musculoskeletal: no gross deformities, strength intact in all four extremities, no gross restriction of joint movements Skin:  no rashes, no hyperemia Neurological: no tremor with outstretched hands   Diabetic Foot Exam - Simple   No data filed     CMP ( most recent) CMP     Component Value Date/Time   NA 133 (L) 11/24/2022 0751   NA 138 02/18/2021 0934   K 3.7 11/24/2022 0751   CL 100 11/24/2022 0751   CO2 23 11/24/2022 0751   GLUCOSE 294 (H) 11/24/2022 0751    BUN 13 11/24/2022 0751   BUN 11 02/18/2021 0934   CREATININE 0.83 11/24/2022 0751   CALCIUM 9.1 11/24/2022 0751   PROT 7.8 11/24/2022 0751   PROT 7.7 02/18/2021 0934   ALBUMIN 3.9 11/24/2022 0751   ALBUMIN 5.2 (H) 02/18/2021 0934   AST 34 11/24/2022 0751   ALT 45 (H) 11/24/2022 0751   ALKPHOS 69 11/24/2022 0751   BILITOT 0.5 11/24/2022 0751   BILITOT 0.3 02/18/2021 0934   GFRNONAA >60 11/24/2022 0751  Diabetic Labs (most recent): Lab Results  Component Value Date   HGBA1C 8.5 (A) 01/25/2023   HGBA1C 8.2 (A) 07/19/2022   HGBA1C 10.0 (H) 06/21/2021   MICROALBUR 150mg /L 07/19/2022   MICROALBUR 150 06/24/2021     Lipid Panel ( most recent) Lipid Panel     Component Value Date/Time   CHOL 158 06/21/2021 1011   TRIG 125 06/21/2021 1011   HDL 62 06/21/2021 1011   CHOLHDL 2.5 06/21/2021 1011   LDLCALC 74 06/21/2021 1011   LABVLDL 22 06/21/2021 1011      Lab Results  Component Value Date   TSH 2.430 02/18/2021           Assessment & Plan:   1) Type 1 diabetes mellitus without complications (HCC)  She presents today with her CGM showing improving glycemic profile overall.  Her POCT A1c today is 8.5%, increasing from last visit of 8.2%.  She recently found out she is [redacted] weeks pregnant (has history of miscarriage in the past).  Analysis of her CGM shows TIR 53%, TAR 47%, TBR <1% with a GMI of 7.7%.    - Atzin Shoemaker has currently uncontrolled symptomatic type 2 DM since 32 years of age.   -Recent labs reviewed.    - I had a long discussion with her about the progressive nature of diabetes and the pathology behind its complications. -her diabetes is not currently complicated but she remains at a high risk for more acute and chronic complications which include CAD, CVA, CKD, retinopathy, and neuropathy. These are all discussed in detail with her.  The following Lifestyle Medicine recommendations according to American College of Lifestyle Medicine [facility]) were  discussed and offered to patient and she agrees to start the journey:  A. Whole Foods, Plant-based plate comprising of fruits and vegetables, plant-based proteins, whole-grain carbohydrates was discussed in detail with the patient.   A list for source of those nutrients were also provided to the patient.  Patient will use only water or unsweetened tea for hydration. B.  The need to stay away from risky substances including alcohol, smoking; obtaining 7 to 9 hours of restorative sleep, at least 150 minutes of moderate intensity exercise weekly, the importance of healthy social connections,  and stress reduction techniques were discussed. C.  A full color page of  Calorie density of various food groups per pound showing examples of each food groups was provided to the patient.  - Nutritional counseling repeated at each appointment due to patients tendency to fall back in to old habits.  - The patient admits there is a room for improvement in their diet and drink choices. -  Suggestion is made for the patient to avoid simple carbohydrates from their diet including Cakes, Sweet Desserts / Pastries, Ice Cream, Soda (diet and regular), Sweet Tea, Candies, Chips, Cookies, Sweet Pastries, Store Bought Juices, Alcohol in Excess of 1-2 drinks a day, Artificial Sweeteners, Coffee Creamer, and "Sugar-free" Products. This will help patient to have stable blood glucose profile and potentially avoid unintended weight gain.   - I encouraged the patient to switch to unprocessed or minimally processed complex starch and increased protein intake (animal or plant source), fruits, and vegetables.   - Patient is advised to stick to a routine mealtimes to eat 3 meals a day and avoid unnecessary snacks (to snack only to correct hypoglycemia).  - she will be scheduled with Norm Salt, RDN, CDE for diabetes education.  - I have approached her with the following individualized  plan to manage her diabetes and patient  agrees:   - She is advised to continue her Lantus 35 units SQ nightly and will be more aggressive with her sliding scale to accommodate for her pregnancy and tighter glycemic control.  Her Novolog was adjusted to 6-16 units TID with meals if glucose is above 60 and she is eating (Specific instructions on how to titrate insulin dosage based on glucose readings given to patient in writing).   I also discussed and initiated Omnipod 5 for her which will help assist in keeping her goals tighter during her pregnancy and after.  -she is encouraged to continue monitoring glucose 4 times daily (using her CGM), before meals and before bed, and to call the clinic if she has readings less than 70 or above 300 for 3 tests in a row.    - she is warned not to take insulin without proper monitoring per orders. - Adjustment parameters are given to her for hypo and hyperglycemia in writing.  -Given her type 1 diagnosis, insulin is the only treatment option for her.  - Specific targets for  A1c; LDL, HDL, and Triglycerides were discussed with the patient.  2) Blood Pressure /Hypertension:  her blood pressure is NOT controlled to target but she has not yet taken her medications today.   she is advised to continue her current medications including Losartan 25 mg po daily, Norvasc 5 mg po daily and Metoprolol 25 mg p.o. daily with breakfast.  3) Lipids/Hyperlipidemia:    Review of her recent lipid panel from 06/21/21 showed controlled LDL at 74.  she is advised to continue Crestor 10 mg daily at bedtime.  Side effects and precautions discussed with her.  Will recheck lipid panel prior to next visit.  4)  Weight/Diet:  her Body mass index is 32.52 kg/m.  -  clearly complicating her diabetes care.   she is a candidate for some weight loss. I discussed with her the fact that loss of 5 - 10% of her  current body weight will have the most impact on her diabetes management.  Exercise, and detailed carbohydrates information  provided  -  detailed on discharge instructions.  5) Chronic Care/Health Maintenance: -she is not on ACEI/ARB and is on Statin medications and is encouraged to initiate and continue to follow up with Ophthalmology, Dentist, Podiatrist at least yearly or according to recommendations, and advised to stay away from smoking. I have recommended yearly flu vaccine and pneumonia vaccine at least every 5 years; moderate intensity exercise for up to 150 minutes weekly; and sleep for at least 7 hours a day.  - she is advised to maintain close follow up with Gabriel Earing, FNP for primary care needs, as well as her other providers for optimal and coordinated care.      I spent  37  minutes in the care of the patient today including review of labs from CMP, Lipids, Thyroid Function, Hematology (current and previous including abstractions from other facilities); face-to-face time discussing  her blood glucose readings/logs, discussing hypoglycemia and hyperglycemia episodes and symptoms, medications doses, her options of short and long term treatment based on the latest standards of care / guidelines;  discussion about incorporating lifestyle medicine;  and documenting the encounter. Risk reduction counseling performed per USPSTF guidelines to reduce obesity and cardiovascular risk factors.     Please refer to Patient Instructions for Blood Glucose Monitoring and Insulin/Medications Dosing Guide"  in media tab for additional information. Please  also refer to " Patient Self Inventory" in the Media  tab for reviewed elements of pertinent patient history.  Diamond Robbins participated in the discussions, expressed understanding, and voiced agreement with the above plans.  All questions were answered to her satisfaction. she is encouraged to contact clinic should she have any questions or concerns prior to her return visit.     Follow up plan: - Return in about 1 month (around 02/24/2023) for Diabetes F/U,  Bring meter and logs.   Ronny Bacon, [facility] [address] [place], [place] [address] Phone: [phone] Fax: [phone]  01/25/2023, 11:54 AM

## 2023-01-31 ENCOUNTER — Other Ambulatory Visit: Payer: Self-pay

## 2023-01-31 MED ORDER — OMNIPOD 5 DEXG7G6 PODS GEN 5 MISC: 6 refills | Status: DC

## 2023-02-01 ENCOUNTER — Encounter: Payer: Self-pay | Admitting: Nurse Practitioner

## 2023-02-01 ENCOUNTER — Other Ambulatory Visit: Payer: Self-pay | Admitting: Nurse Practitioner

## 2023-02-01 MED ORDER — INSULIN LISPRO 100 UNIT/ML IJ SOLN: INTRAMUSCULAR | 6 refills | Status: DC

## 2023-02-02 ENCOUNTER — Other Ambulatory Visit: Payer: Self-pay | Admitting: Nurse Practitioner

## 2023-02-06 ENCOUNTER — Other Ambulatory Visit: Payer: Self-pay | Admitting: Nurse Practitioner

## 2023-02-07 MED ORDER — DEXCOM G6 SENSOR MISC: 3 refills | Status: DC

## 2023-02-07 MED ORDER — DEXCOM G6 TRANSMITTER MISC: 3 refills | Status: DC

## 2023-02-08 NOTE — Telephone Encounter (Signed)
Patient states pharmacy is needing PA for her Dexcom G6 sensors

## 2023-02-09 ENCOUNTER — Other Ambulatory Visit (HOSPITAL_COMMUNITY): Payer: Self-pay

## 2023-02-09 ENCOUNTER — Telehealth: Payer: Self-pay

## 2023-02-09 ENCOUNTER — Encounter (HOSPITAL_COMMUNITY): Payer: Self-pay | Admitting: Hematology

## 2023-02-09 NOTE — Telephone Encounter (Signed)
Pharmacy Patient Advocate Encounter   Received notification from Patient Advice Request messages that prior authorization for Dexcom G6 sensor is required/requested.   Per test claim: PA required; PA submitted to [facility] via CoverMyMeds Key/confirmation #/EOC [id number] Status is pending

## 2023-02-09 NOTE — Progress Notes (Signed)
Entered orders for pregnancy test.

## 2023-02-10 ENCOUNTER — Other Ambulatory Visit (HOSPITAL_COMMUNITY): Payer: Self-pay

## 2023-02-10 ENCOUNTER — Encounter (HOSPITAL_COMMUNITY): Payer: Self-pay | Admitting: Hematology

## 2023-02-10 NOTE — Telephone Encounter (Signed)
Pharmacy Patient Advocate Encounter  Received notification from [facility] Medicaid that Prior Authorization for Dexcom G6 sensors has been APPROVED from 01/26/23 to 02/09/24  filled today at pt's pharmacy.   PA #/Case ID/Reference #: PA Case ID #: [id number]

## 2023-02-13 ENCOUNTER — Other Ambulatory Visit: Payer: Self-pay

## 2023-02-13 DIAGNOSIS — C9001 Multiple myeloma in remission: Secondary | ICD-10-CM

## 2023-02-13 NOTE — Telephone Encounter (Signed)
Patient was called and made aware. 

## 2023-02-14 ENCOUNTER — Inpatient Hospital Stay: Payer: Medicaid Other

## 2023-02-14 ENCOUNTER — Inpatient Hospital Stay: Payer: Medicaid Other | Attending: Hematology

## 2023-02-14 DIAGNOSIS — Z794 Long term (current) use of insulin: Secondary | ICD-10-CM | POA: Diagnosis not present

## 2023-02-14 DIAGNOSIS — I10 Essential (primary) hypertension: Secondary | ICD-10-CM | POA: Diagnosis not present

## 2023-02-14 DIAGNOSIS — E109 Type 1 diabetes mellitus without complications: Secondary | ICD-10-CM | POA: Diagnosis not present

## 2023-02-14 DIAGNOSIS — Z419 Encounter for procedure for purposes other than remedying health state, unspecified: Secondary | ICD-10-CM | POA: Diagnosis not present

## 2023-02-14 DIAGNOSIS — C9001 Multiple myeloma in remission: Secondary | ICD-10-CM

## 2023-02-14 DIAGNOSIS — C9000 Multiple myeloma not having achieved remission: Secondary | ICD-10-CM | POA: Insufficient documentation

## 2023-02-14 LAB — COMPREHENSIVE METABOLIC PANEL
ALT: 22 U/L (ref 0–44)
AST: 23 U/L (ref 15–41)
Albumin: 3.9 g/dL (ref 3.5–5.0)
Alkaline Phosphatase: 47 U/L (ref 38–126)
Anion gap: 10 (ref 5–15)
BUN: 6 mg/dL (ref 6–20)
CO2: 22 mmol/L (ref 22–32)
Calcium: 9.2 mg/dL (ref 8.9–10.3)
Chloride: 99 mmol/L (ref 98–111)
Creatinine, Ser: 0.69 mg/dL (ref 0.44–1.00)
GFR, Estimated: >60 mL/min (ref >60)
Glucose, Bld: 191 mg/dL — ABNORMAL HIGH (ref 70–99)
Potassium: 3.4 mmol/L — ABNORMAL LOW (ref 3.5–5.1)
Sodium: 131 mmol/L — ABNORMAL LOW (ref 135–145)
Total Bilirubin: 0.4 mg/dL (ref 0.3–1.2)
Total Protein: 7.6 g/dL (ref 6.5–8.1)

## 2023-02-14 LAB — CBC WITH DIFFERENTIAL/PLATELET
Abs Immature Granulocytes: 0.02 10*3/uL (ref 0.00–0.07)
Basophils Absolute: 0 10*3/uL (ref 0.0–0.1)
Basophils Relative: 0 %
Eosinophils Absolute: 0.2 10*3/uL (ref 0.0–0.5)
Eosinophils Relative: 3 %
HCT: 40 % (ref 36.0–46.0)
Hemoglobin: 13.1 g/dL (ref 12.0–15.0)
Immature Granulocytes: 0 %
Lymphocytes Relative: 26 %
Lymphs Abs: 1.4 10*3/uL (ref 0.7–4.0)
MCH: 28.3 pg (ref 26.0–34.0)
MCHC: 32.8 g/dL (ref 30.0–36.0)
MCV: 86.4 fL (ref 80.0–100.0)
Monocytes Absolute: 0.5 10*3/uL (ref 0.1–1.0)
Monocytes Relative: 10 %
Neutro Abs: 3.3 10*3/uL (ref 1.7–7.7)
Neutrophils Relative %: 61 %
Platelets: 270 10*3/uL (ref 150–400)
RBC: 4.63 MIL/uL (ref 3.87–5.11)
RDW: 13.2 % (ref 11.5–15.5)
WBC: 5.4 10*3/uL (ref 4.0–10.5)
nRBC: 0 % (ref 0.0–0.2)

## 2023-02-14 LAB — LACTATE DEHYDROGENASE: LDH: 114 U/L (ref 98–192)

## 2023-02-15 LAB — KAPPA/LAMBDA LIGHT CHAINS
Kappa free light chain: 12.1 mg/L (ref 3.3–19.4)
Kappa, lambda light chain ratio: 1.44 (ref 0.26–1.65)
Lambda free light chains: 8.4 mg/L (ref 5.7–26.3)

## 2023-02-16 ENCOUNTER — Other Ambulatory Visit: Payer: Self-pay | Admitting: Hematology

## 2023-02-16 DIAGNOSIS — I10 Essential (primary) hypertension: Secondary | ICD-10-CM

## 2023-02-16 NOTE — Telephone Encounter (Signed)
Should this be discontinued due to pregnancy?

## 2023-02-17 LAB — PROTEIN ELECTROPHORESIS, SERUM
A/G Ratio: 1 (ref 0.7–1.7)
Albumin ELP: 3.7 g/dL (ref 2.9–4.4)
Alpha-1-Globulin: 0.3 g/dL (ref 0.0–0.4)
Alpha-2-Globulin: 0.9 g/dL (ref 0.4–1.0)
Beta Globulin: 1.3 g/dL (ref 0.7–1.3)
Gamma Globulin: 1.2 g/dL (ref 0.4–1.8)
Globulin, Total: 3.6 g/dL (ref 2.2–3.9)
Total Protein ELP: 7.3 g/dL (ref 6.0–8.5)

## 2023-02-19 LAB — IMMUNOFIXATION ELECTROPHORESIS
IgA: 107 mg/dL (ref 87–352)
IgG (Immunoglobin G), Serum: 1255 mg/dL (ref 586–1602)
IgM (Immunoglobulin M), Srm: 21 mg/dL — ABNORMAL LOW (ref 26–217)
Total Protein ELP: 7.2 g/dL (ref 6.0–8.5)

## 2023-02-21 ENCOUNTER — Ambulatory Visit: Payer: Medicaid Other

## 2023-02-21 ENCOUNTER — Inpatient Hospital Stay (HOSPITAL_BASED_OUTPATIENT_CLINIC_OR_DEPARTMENT_OTHER): Payer: Medicaid Other | Admitting: Hematology

## 2023-02-21 VITALS — BP 138/108 | HR 84 | Temp 98.3°F | Resp 18 | Wt 187.8 lb

## 2023-02-21 DIAGNOSIS — Z794 Long term (current) use of insulin: Secondary | ICD-10-CM | POA: Diagnosis not present

## 2023-02-21 DIAGNOSIS — C9001 Multiple myeloma in remission: Secondary | ICD-10-CM | POA: Diagnosis not present

## 2023-02-21 DIAGNOSIS — I10 Essential (primary) hypertension: Secondary | ICD-10-CM | POA: Diagnosis not present

## 2023-02-21 DIAGNOSIS — E109 Type 1 diabetes mellitus without complications: Secondary | ICD-10-CM | POA: Diagnosis not present

## 2023-02-21 DIAGNOSIS — C9000 Multiple myeloma not having achieved remission: Secondary | ICD-10-CM | POA: Diagnosis not present

## 2023-02-21 NOTE — Patient Instructions (Addendum)
[facility] Discharge Instructions   You were seen and examined today by Dr. Ellin Saba.  He reviewed the results of your lab work which are all normal/stable. Your myeloma labs are completely normal.   We will see you back in 3 months. We will repeat lab work prior to your next visit.   Return as scheduled.    Thank you for choosing [facility] to provide your oncology and hematology care.  To afford each patient quality time with our provider, please arrive at least 15 minutes before your scheduled appointment time.   If you have a lab appointment with the Cancer Center please come in thru the Main Entrance and check in at the main information desk.  You need to re-schedule your appointment should you arrive 10 or more minutes late.  We strive to give you quality time with our providers, and arriving late affects you and other patients whose appointments are after yours.  Also, if you no show three or more times for appointments you may be dismissed from the clinic at the providers discretion.     Again, thank you for choosing [facility].  Our hope is that these requests will decrease the amount of time that you wait before being seen by our physicians.       _____________________________________________________________  Should you have questions after your visit to [facility], please contact our office at [phone] and follow the prompts.  Our office hours are 8:00 a.m. and 4:30 p.m. Monday - Friday.  Please note that voicemails left after 4:00 p.m. may not be returned until the following business day.  We are closed weekends and major holidays.  You do have access to a nurse 24-7, just call the main number to the clinic [phone] and do not press any options, hold on the line and a nurse will answer the phone.    For prescription refill requests, have your pharmacy contact our  office and allow 72 hours.    Due to Covid, you will need to wear a mask upon entering the hospital. If you do not have a mask, a mask will be given to you at the Main Entrance upon arrival. For doctor visits, patients may have 1 support person age 68 or older with them. For treatment visits, patients can not have anyone with them due to social distancing guidelines and our immunocompromised population.

## 2023-02-21 NOTE — Progress Notes (Signed)
[facility] [address], [place] [address]    Clinic Day:  02/21/23   Referring physician: Gabriel Earing, FNP  Patient Care Team: Diamond Earing, FNP as PCP - General (Family Medicine) Diamond Sarah, RN as Oncology Nurse Navigator (Oncology) Diamond Massed, MD as Medical Oncologist (Oncology) Diamond Robbins, RPH-CPP (Pharmacist) Diamond Gobble, NP as Nurse Practitioner (Endocrinology)   ASSESSMENT & PLAN:   Assessment: 1.  Stage III IgA kappa multiple myeloma: -Presentation to [facility] in [place] with confusion and weakness. -Found to have diabetic ketoacidosis with newly diagnosed type 1 diabetes. -Also found to have acute renal failure and hypercalcemia. -Bone marrow biopsy on 02/25/2020 with flow cytometry with the population CD138 positive, CD56 positive IgA kappa monoclonal plasma cells 12%.  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 -Ultrasound abdomen on 02/23/2020 showed increased hepatic echogenicity with normal spleen. -Renal ultrasound on 02/24/2020 was normal. -CT chest on 02/22/2020 with moth eaten appearance of the bones. -Beta-2 microglobulin 2.1.  LDH 322.  SPEP and immunofixation were normal.  Kappa light chains are elevated at 36.8.  Lambda light chains 12.9 and ratio of 2.85. -24-hour urine immunofixation was negative.  M spike was negative. -PET scan on 03/23/2020 showed no FDG radiotracer activity, no soft tissue plasmacytoma.  Diffuse multiple small lytic lesions within the pelvis, spine and vertebral bodies consistent with myeloma. -Chromosome analysis was 74, XX.  Multiple myeloma FISH panel was normal. -She is considered high risk based on elevated LDH level. -4 cycles of Dara VRD from 03/31/2020 through 06/12/2020. 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 - BMBX on  12/15/2020 with variably normocellular 30 to 70% with trilineage hematopoiesis.  No increase in plasma cells.  Myeloma FISH panel was normal.  Chromosome analysis was normal. - MRD results were negative. - Maintenance Revlimid 10 mg 3 weeks on/1 week off started around 12/08/2020.   2.  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  Non-smoker. -No family history of myeloma or other malignancies.   Plan: 1.  Stage III IgA kappa multiple myeloma: - She had a urine pregnancy test positive on 12/28/2022.  She took her last Revlimid approximately 2 days (12/26/2022) prior to her positive urine pregnancy test. - Expected date of delivery is on 09/04/2023. - She is following with OB/GYN clinic.  She does have morning sickness. - Revlimid is on hold since her urine pregnancy was positive. - Reviewed labs from 02/14/2023: Creatinine and calcium were normal.  CBC was normal.  SPEP did not show M spike.  Free light chain ratio is normal at 1.44.  Immunofixation was unremarkable. - Will continue monitoring with myeloma labs in 3 months.   2.  Type I diabetes: - Continue Lantus and NovoLog sliding scale.   3.  Hypertension: - She will continue antihypertensives.  Blood pressure today is 138/108.   4.  Bone protection:  - Will hold her Zometa throughout her pregnancy.  Orders Placed This Encounter  Procedures   CBC with Differential    Standing Status:   Future    Standing Expiration Date:   02/21/2024   Comprehensive metabolic panel    Standing Status:   Future    Standing Expiration Date:   02/21/2024   Lactate dehydrogenase    Standing Status:   Future    Standing Expiration Date:   02/21/2024  Kappa/lambda light chains    Standing Status:   Future    Standing Expiration Date:   02/21/2024   Immunofixation electrophoresis    Standing Status:   Future    Standing Expiration Date:   02/21/2024   Protein electrophoresis, serum    Standing Status:   Future     Standing Expiration Date:   02/21/2024      Diamond Robbins as a scribe for Diamond Massed, MD.,have documented all relevant documentation on the behalf of Diamond Massed, MD,as directed by  Diamond Massed, MD while in the presence of Diamond Massed, MD.  I, Diamond Massed MD, have reviewed the above documentation for accuracy and completeness, and I agree with the above.     Diamond Massed, MD   10/8/20244:38 PM  CHIEF COMPLAINT:   Diagnosis: IgA kappa multiple myeloma    Cancer Staging  No matching staging information was found for the patient.    Prior Therapy: 1. Dara RVD 2. bone marrow transplant on 09/10/2020   Current Therapy:  Revlimid 10 mg 3 weeks on/1 week off    HISTORY OF PRESENT ILLNESS:   Oncology History  Multiple myeloma (HCC)  03/09/2020 Initial Diagnosis   Multiple myeloma (HCC)   03/31/2020 - 06/12/2020 Chemotherapy            INTERVAL HISTORY:   Diamond Robbins is a 32 y.o. female presenting to clinic today for follow up of IgA kappa multiple myeloma. She was last seen by me on 11/29/22.  Today, she states that she is doing well overall. Her appetite level is at 100%. Her energy level is at 75%.  She reports morning sickness. Her date of delivery is 09/04/22. She has an appointment on 02/28/23 with an OB/GYN at [facility]. She denies any new pains or infections.  She does not remember the exact date she last took Revlimid, just that it was a few days before receiving her positive pregnancy test on 12/28/22.   PAST MEDICAL HISTORY:   Past Medical History: Past Medical History:  Diagnosis Date   Cancer (HCC) 08/01/2020   multiple myeloma   DKA (diabetic ketoacidosis) (HCC) 02/23/2020   Hypertension    Type 1 diabetes mellitus (HCC) 02/23/2020    Surgical History: Past Surgical History:  Procedure Laterality Date   BONE MARROW TRANSPLANT     NO PAST SURGERIES      Social History: Social History    Socioeconomic History   Marital status: Married    Spouse name: Diamond Robbins   Number of children: Not on file   Years of education: 12   Highest education level: High school graduate  Occupational History   Occupation: unemployed    Comment: can go back to work Jan 2023  Tobacco Use   Smoking status: Never    Passive exposure: Never   Smokeless tobacco: Never  Vaping Use   Vaping status: Never Used  Substance and Sexual Activity   Alcohol use: Not Currently    Comment: occ- 0-1 drinks per week; maybe 1 drink every 6 months   Drug use: No   Sexual activity: Yes    Birth control/protection: Condom  Other Topics Concern   Not on file  Social History Narrative   Has 2 step-daughters   Social Determinants of Health   Financial Resource Strain: Low Risk  (04/25/2022)   Overall Financial Resource Strain (CARDIA)    Difficulty of Paying Living Expenses: Not hard at all  Food Insecurity: No Food Insecurity (04/07/2021)  Hunger Vital Sign    Worried About Running Out of Food in the Last Year: Never true    Ran Out of Food in the Last Year: Never true  Transportation Needs: No Transportation Needs (11/14/2022)   PRAPARE - Administrator, Civil Service (Medical): No    Lack of Transportation (Non-Medical): No  Physical Activity: Sufficiently Active (06/08/2021)   Exercise Vital Sign    Days of Exercise per Week: 3 days    Minutes of Exercise per Session: 60 min  Stress: No Stress Concern Present (03/09/2020)   [facility] of Occupational Health - Occupational Stress Questionnaire    Feeling of Stress : Not at all  Social Connections: Socially Integrated (03/08/2021)   Social Connection and Isolation Panel [NHANES]    Frequency of Communication with Friends and Family: More than three times a week    Frequency of Social Gatherings with Friends and Family: More than three times a week    Attends Religious Services: More than 4 times per year    Active Member of [employer]  or Organizations: Yes    Attends Banker Meetings: Never    Marital Status: Married  Catering manager Violence: Not At Risk (03/09/2020)   Humiliation, Afraid, Rape, and Kick questionnaire    Fear of Current or Ex-Partner: No    Emotionally Abused: No    Physically Abused: No    Sexually Abused: No    Family History: Family History  Problem Relation Age of Onset   Hypertension Mother    Diabetes Father    Hypertension Father    Stroke Maternal Grandmother    Hypertension Maternal Grandmother    Diabetes Maternal Grandmother    Hypertension Maternal Grandfather    Heart attack Maternal Grandfather    Hypertension Paternal Grandmother    Hypertension Paternal Grandfather     Current Medications:  Current Outpatient Medications:    ACCU-CHEK GUIDE test strip, Use to check blood glucose 4 times daily., Disp: 125 each, Rfl: 1   Accu-Chek Softclix Lancets lancets, Use to check blood glucose 4 times daily., Disp: 200 each, Rfl: 0   Blood Glucose Monitoring Suppl (ACCU-CHEK GUIDE) w/Device KIT, , Disp: , Rfl:    Blood Pressure Monitor KIT, Use to check blood pressure as instructed, Disp: 1 kit, Rfl: 0   calcium carbonate (OS-CAL) 1250 (500 Ca) MG chewable tablet, Chew 1 tablet by mouth daily., Disp: , Rfl:    cetirizine (ZYRTEC) 10 MG tablet, Take 1 tablet by mouth once daily, Disp: 30 tablet, Rfl: 0   cholecalciferol (VITAMIN D3) 25 MCG (1000 UNIT) tablet, Take 1,000 Units by mouth daily., Disp: , Rfl:    Continuous Glucose Sensor (DEXCOM G6 SENSOR) MISC, Change sensor every 10 days as directed, Disp: 9 each, Rfl: 3   Continuous Glucose Transmitter (DEXCOM G6 TRANSMITTER) MISC, Change transmitter every 90 days as directed., Disp: 1 each, Rfl: 3   fluticasone (FLONASE) 50 MCG/ACT nasal spray, Place 2 sprays into both nostrils daily., Disp: 16 g, Rfl: 6   Insulin Disposable Pump (OMNIPOD 5 G6 INTRO, GEN 5,) KIT, Change pod every 48-72 hours, Disp: 1 kit, Rfl: 0   Insulin  Disposable Pump (OMNIPOD 5 G6 PODS, GEN 5,) MISC, Change pod every 48-72 hours, Disp: 15 each, Rfl: 6   insulin lispro (HUMALOG) 100 UNIT/ML injection, Use with Omnipod for TDD around 70 units daily, Disp: 60 mL, Rfl: 6   labetalol (NORMODYNE) 100 MG tablet, Take 1 tablet (100 mg total)  by mouth 2 (two) times daily., Disp: 180 tablet, Rfl: 3   Multiple Vitamin (MULTIVITAMIN) tablet, Take 1 tablet by mouth daily., Disp: , Rfl:    NIFEdipine (PROCARDIA-XL/NIFEDICAL-XL) 30 MG 24 hr tablet, Take 1 tablet (30 mg total) by mouth daily., Disp: 90 tablet, Rfl: 3   Allergies: No Known Allergies  REVIEW OF SYSTEMS:   Review of Systems  Constitutional:  Negative for chills, fatigue and fever.  HENT:   Negative for lump/mass, mouth sores, nosebleeds, sore throat and trouble swallowing.   Eyes:  Negative for eye problems.  Respiratory:  Negative for cough and shortness of breath.   Cardiovascular:  Negative for chest pain, leg swelling and palpitations.  Gastrointestinal:  Positive for nausea. Negative for abdominal pain, constipation, diarrhea and vomiting.  Genitourinary:  Negative for bladder incontinence, difficulty urinating, dysuria, frequency, hematuria and nocturia.   Musculoskeletal:  Negative for arthralgias, back pain, flank pain, myalgias and neck pain.  Skin:  Negative for itching and rash.  Neurological:  Negative for dizziness, headaches and numbness.  Hematological:  Does not bruise/bleed easily.  Psychiatric/Behavioral:  Negative for depression, sleep disturbance and suicidal ideas. The patient is not nervous/anxious.   All other systems reviewed and are negative.    VITALS:   Blood pressure (!) 138/108, pulse 84, temperature 98.3 F (36.8 C), temperature source Oral, resp. rate 18, weight 187 lb 12.8 oz (85.2 kg), last menstrual period 11/28/2022, SpO2 100%.  Wt Readings from Last 3 Encounters:  02/21/23 187 lb 12.8 oz (85.2 kg)  01/25/23 183 lb 9.6 oz (83.3 kg)  12/30/22 183  lb 9.6 oz (83.3 kg)    Body mass index is 33.27 kg/m.  Performance status (ECOG): 1 - Symptomatic but completely ambulatory  PHYSICAL EXAM:   Physical Exam Vitals and nursing note reviewed. Exam conducted with a chaperone present.  Constitutional:      Appearance: Normal appearance.  Cardiovascular:     Rate and Rhythm: Normal rate and regular rhythm.     Pulses: Normal pulses.     Heart sounds: Normal heart sounds.  Pulmonary:     Effort: Pulmonary effort is normal.     Breath sounds: Normal breath sounds.  Abdominal:     Palpations: Abdomen is soft. There is no hepatomegaly, splenomegaly or mass.     Tenderness: There is no abdominal tenderness.  Musculoskeletal:     Right lower leg: No edema.     Left lower leg: No edema.  Lymphadenopathy:     Cervical: No cervical adenopathy.     Right cervical: No superficial, deep or posterior cervical adenopathy.    Left cervical: No superficial, deep or posterior cervical adenopathy.     Upper Body:     Right upper body: No supraclavicular or axillary adenopathy.     Left upper body: No supraclavicular or axillary adenopathy.  Neurological:     General: No focal deficit present.     Mental Status: She is alert and oriented to person, place, and time.  Psychiatric:        Mood and Affect: Mood normal.        Behavior: Behavior normal.     LABS:      Latest Ref Rng & Units 02/14/2023    8:54 AM 11/24/2022    7:51 AM 08/24/2022    9:04 AM  CBC  WBC 4.0 - 10.5 K/uL 5.4  3.5  3.0   Hemoglobin 12.0 - 15.0 g/dL 57.8  46.9  62.9   Hematocrit  36.0 - 46.0 % 40.0  40.8  41.8   Platelets 150 - 400 K/uL 270  234  243       Latest Ref Rng & Units 02/14/2023    8:54 AM 11/24/2022    7:51 AM 08/24/2022    9:04 AM  CMP  Glucose 70 - 99 mg/dL 161  096  045   BUN 6 - 20 mg/dL 6  13  7    Creatinine 0.44 - 1.00 mg/dL 4.09  8.11  9.14   Sodium 135 - 145 mmol/L 131  133  137   Potassium 3.5 - 5.1 mmol/L 3.4  3.7  3.5   Chloride 98 - 111  mmol/L 99  100  107   CO2 22 - 32 mmol/L 22  23  23    Calcium 8.9 - 10.3 mg/dL 9.2  9.1  8.6   Total Protein 6.5 - 8.1 g/dL 7.6  7.8  7.5   Total Bilirubin 0.3 - 1.2 mg/dL 0.4  0.5  0.7   Alkaline Phos 38 - 126 U/L 47  69  72   AST 15 - 41 U/L 23  34  36   ALT 0 - 44 U/L 22  45  51      No results found for: "CEA1", "CEA" / No results found for: "CEA1", "CEA" No results found for: "PSA1" No results found for: "CAN199" No results found for: "CAN125"  Lab Results  Component Value Date   TOTALPROTELP 7.2 02/14/2023   TOTALPROTELP 7.3 02/14/2023   ALBUMINELP 3.7 02/14/2023   A1GS 0.3 02/14/2023   A2GS 0.9 02/14/2023   BETS 1.3 02/14/2023   GAMS 1.2 02/14/2023   MSPIKE Not Observed 02/14/2023   SPEI Comment 02/14/2023   No results found for: "TIBC", "FERRITIN", "IRONPCTSAT" Lab Results  Component Value Date   LDH 114 02/14/2023   LDH 152 11/24/2021   LDH 160 08/30/2021     STUDIES:   US OB Comp Less 14 Wks  Result Date: 01/25/2023 Table formatting from the original result was not included. Images from the original result were not included.  ..an Financial trader of Ultrasound Medicine Technical sales engineer) accredited practice [facility] [address] [place] [address] Ordering Provider: Myna Hidalgo, DO                                                                                                                             DATING AND VIABILITY SONOGRAM Lakeva Hollon is a 32 y.o. year old G2P0000 Patient's last menstrual period was 11/28/2022. which would correlate to  [redacted]w[redacted]d weeks gestation.  She has irregular menstrual cycles.   She is here today for a confirmatory initial sonogram. GESTATION: SINGLETON   FETAL ACTIVITY:          Heart rate         164         CERVIX: Appears closed ADNEXA: The ovaries are normal.  GESTATIONAL AGE AND  BIOMETRICS: Gestational criteria: Estimated Date of Delivery: 09/04/23 by LMP now at [redacted]w[redacted]d Previous Scans:0    CROWN  RUMP LENGTH           15.63 mm         8 weeks                                                                      AVERAGE EGA(BY THIS SCAN):  8 weeks WORKING EDD( LMP ):  09/04/2023  TECHNICIAN COMMENTS: [place] 8+1 wks,single IUP with yolk sac,normal ovaries,FHR 164 bpm,anterior subserosal fibroid 2.3 x 1.2 x 2 cm,posterior subserosal fibroid 2.6 x 1.9 x 3.4 cm,CRL 15.63 mm A copy of this report including all images has been saved and backed up to a second source for retrieval if needed. All measures and details of the anatomical scan, placentation, fluid volume and pelvic anatomy are contained in that report. Amber Flora Lipps 01/24/2023 4:41 PM Clinical Impression and recommendations: I have reviewed the sonogram results above, combined with the patient's current clinical course, below are my impressions and any appropriate recommendations for management based on the sonographic findings. Viable early IUP G2P0000 Estimated Date of Delivery: 09/04/23 LMP Two fibroids noted- anterior subserosal fibroid 2.3 x 1.2 x 2 cm,posterior subserosal fibroid 2.6 x 1.9 x 3.4 cm.  Normal ovaries bilaterally Recommend routine care unless otherwise clinically indicated Sharon Seller

## 2023-02-27 ENCOUNTER — Ambulatory Visit: Payer: Medicaid Other | Admitting: Nurse Practitioner

## 2023-02-27 ENCOUNTER — Other Ambulatory Visit: Payer: Self-pay | Admitting: Obstetrics & Gynecology

## 2023-02-27 DIAGNOSIS — Z794 Long term (current) use of insulin: Secondary | ICD-10-CM

## 2023-02-27 DIAGNOSIS — Z3682 Encounter for antenatal screening for nuchal translucency: Secondary | ICD-10-CM

## 2023-02-27 DIAGNOSIS — E1065 Type 1 diabetes mellitus with hyperglycemia: Secondary | ICD-10-CM

## 2023-02-27 DIAGNOSIS — E782 Mixed hyperlipidemia: Secondary | ICD-10-CM

## 2023-02-28 ENCOUNTER — Encounter: Payer: Medicaid Other | Admitting: *Deleted

## 2023-02-28 ENCOUNTER — Encounter: Payer: Self-pay | Admitting: Women's Health

## 2023-02-28 ENCOUNTER — Other Ambulatory Visit: Payer: Medicaid Other

## 2023-02-28 ENCOUNTER — Ambulatory Visit (INDEPENDENT_AMBULATORY_CARE_PROVIDER_SITE_OTHER): Payer: Medicaid Other | Admitting: Women's Health

## 2023-02-28 VITALS — BP 131/96 | HR 83 | Wt 187.0 lb

## 2023-02-28 DIAGNOSIS — Z3682 Encounter for antenatal screening for nuchal translucency: Secondary | ICD-10-CM | POA: Diagnosis not present

## 2023-02-28 DIAGNOSIS — E1059 Type 1 diabetes mellitus with other circulatory complications: Secondary | ICD-10-CM

## 2023-02-28 DIAGNOSIS — O10911 Unspecified pre-existing hypertension complicating pregnancy, first trimester: Secondary | ICD-10-CM

## 2023-02-28 DIAGNOSIS — I152 Hypertension secondary to endocrine disorders: Secondary | ICD-10-CM | POA: Diagnosis not present

## 2023-02-28 DIAGNOSIS — Z3A13 13 weeks gestation of pregnancy: Secondary | ICD-10-CM | POA: Diagnosis not present

## 2023-02-28 DIAGNOSIS — O0991 Supervision of high risk pregnancy, unspecified, first trimester: Secondary | ICD-10-CM | POA: Diagnosis not present

## 2023-02-28 DIAGNOSIS — O10919 Unspecified pre-existing hypertension complicating pregnancy, unspecified trimester: Secondary | ICD-10-CM | POA: Insufficient documentation

## 2023-02-28 DIAGNOSIS — D259 Leiomyoma of uterus, unspecified: Secondary | ICD-10-CM | POA: Insufficient documentation

## 2023-02-28 DIAGNOSIS — Z1332 Encounter for screening for maternal depression: Secondary | ICD-10-CM | POA: Diagnosis not present

## 2023-02-28 DIAGNOSIS — E1065 Type 1 diabetes mellitus with hyperglycemia: Secondary | ICD-10-CM | POA: Diagnosis not present

## 2023-02-28 DIAGNOSIS — O0990 Supervision of high risk pregnancy, unspecified, unspecified trimester: Secondary | ICD-10-CM | POA: Insufficient documentation

## 2023-02-28 MED ORDER — ASPIRIN 81 MG PO TBEC: 162.0000 mg | DELAYED_RELEASE_TABLET | Freq: Every day | ORAL | 2 refills | Status: DC

## 2023-02-28 NOTE — Progress Notes (Signed)
[place] 13+1 wks,measurements c/w dates,CRL 64.01 mm,NT 1.5 mm,NB present,FHR 165 bpm,posterior placenta,multiple fibroids,(#1) posterior subserosal 3.6 x 2.5 x 1.9 cm,(#2) anterior subserosal 2.2 x 1.3 x 2 cm,normal ovaries,limited view because of fibroids

## 2023-02-28 NOTE — Patient Instructions (Addendum)
Diamond Robbins, thank you for choosing our office today! We appreciate the opportunity to meet your healthcare needs. You may receive a short survey by mail, e-mail, or through [employer]. If you are happy with your care we would appreciate if you could take just a few minutes to complete the survey questions. We read all of your comments and take your feedback very seriously. Thank you again for choosing our office.  [facility] [employer] Team at [facility]  [facility] at [facility] ([address] [place], [place] [address]) Entrance C, located off of [address] 24/7 valet parking   Checking Blood Sugars Check blood sugars 4 times a day: in the morning before eating/drinking anything (goal is <95) and 2 hours after your first bite of breakfast, lunch, and supper (goal is <120). Please keep a log (Example 10/12/20: 95, 120, 120, 120) and bring to each appointment.    Nausea & Vomiting Have saltine crackers or pretzels by your bed and eat a few bites before you raise your head out of bed in the morning Eat small frequent meals throughout the day instead of large meals Drink plenty of fluids throughout the day to stay hydrated, just don't drink a lot of fluids with your meals.  This can make your stomach fill up faster making you feel sick Do not brush your teeth right after you eat Products with real ginger are good for nausea, like ginger ale and ginger hard candy Make sure it says made with real ginger! Sucking on sour candy like lemon heads is also good for nausea If your prenatal vitamins make you nauseated, take them at night so you will sleep through the nausea Sea Bands If you feel like you need medicine for the nausea & vomiting please let us know If you are unable to keep any fluids or food down please let us know   Constipation Drink plenty of fluid, preferably water, throughout the day Eat foods high in fiber such as fruits, vegetables, and grains Exercise,  such as walking, is a good way to keep your bowels regular Drink warm fluids, especially warm prune juice, or decaf coffee Eat a 1/2 cup of real oatmeal (not instant), 1/2 cup applesauce, and 1/2-1 cup warm prune juice every day If needed, you may take Colace (docusate sodium) stool softener once or twice a day to help keep the stool soft.  If you still are having problems with constipation, you may take Miralax once daily as needed to help keep your bowels regular.   Home Blood Pressure Monitoring for Patients   Your provider has recommended that you check your blood pressure (BP) at least once a week at home. If you do not have a blood pressure cuff at home, one will be provided for you. Contact your provider if you have not received your monitor within 1 week.   Helpful Tips for Accurate Home Blood Pressure Checks  Don't smoke, exercise, or drink caffeine 30 minutes before checking your BP Use the restroom before checking your BP (a full bladder can raise your pressure) Relax in a comfortable upright chair Feet on the ground Left arm resting comfortably on a flat surface at the level of your heart Legs uncrossed Back supported Sit quietly and don't talk Place the cuff on your bare arm Adjust snuggly, so that only two fingertips can fit between your skin and the top of the cuff Check 2 readings separated by at least one minute  Keep a log of your BP readings For a visual, please reference this diagram: http://ccnc.care/bpdiagram  Provider Name: [facility]     Phone: [phone]  Zone 1: ALL CLEAR  Continue to monitor your symptoms:  BP reading is less than 140 (top number) or less than 90 (bottom number)  No right upper stomach pain No headaches or seeing spots No feeling nauseated or throwing up No swelling in face and hands  Zone 2: CAUTION Call your doctor's office for any of the following:  BP reading is greater than 140 (top number) or greater than 90 (bottom  number)  Stomach pain under your ribs in the middle or right side Headaches or seeing spots Feeling nauseated or throwing up Swelling in face and hands  Zone 3: EMERGENCY  Seek immediate medical care if you have any of the following:  BP reading is greater than160 (top number) or greater than 110 (bottom number) Severe headaches not improving with Tylenol Serious difficulty catching your breath Any worsening symptoms from Zone 2    First Trimester of Pregnancy The first trimester of pregnancy is from week 1 until the end of week 12 (months 1 through 3). A week after a sperm fertilizes an egg, the egg will implant on the wall of the uterus. This embryo will begin to develop into a baby. Genes from you and your partner are forming the baby. The female genes determine whether the baby is a boy or a girl. At 6-8 weeks, the eyes and face are formed, and the heartbeat can be seen on ultrasound. At the end of 12 weeks, all the baby's organs are formed.  Now that you are pregnant, you will want to do everything you can to have a healthy baby. Two of the most important things are to get good prenatal care and to follow your health care provider's instructions. Prenatal care is all the medical care you receive before the baby's birth. This care will help prevent, find, and treat any problems during the pregnancy and childbirth. BODY CHANGES Your body goes through many changes during pregnancy. The changes vary from woman to woman.  You may gain or lose a couple of pounds at first. You may feel sick to your stomach (nauseous) and throw up (vomit). If the vomiting is uncontrollable, call your health care provider. You may tire easily. You may develop headaches that can be relieved by medicines approved by your health care provider. You may urinate more often. Painful urination may mean you have a bladder infection. You may develop heartburn as a result of your pregnancy. You may develop constipation  because certain hormones are causing the muscles that push waste through your intestines to slow down. You may develop hemorrhoids or swollen, bulging veins (varicose veins). Your breasts may begin to grow larger and become tender. Your nipples may stick out more, and the tissue that surrounds them (areola) may become darker. Your gums may bleed and may be sensitive to brushing and flossing. Dark spots or blotches (chloasma, mask of pregnancy) may develop on your face. This will likely fade after the baby is born. Your menstrual periods will stop. You may have a loss of appetite. You may develop cravings for certain kinds of food. You may have changes in your emotions from day to day, such as being excited to be pregnant or being concerned that something may go wrong with the pregnancy and baby. You may have more vivid and strange dreams. You may have changes  in your hair. These can include thickening of your hair, rapid growth, and changes in texture. Some women also have hair loss during or after pregnancy, or hair that feels dry or thin. Your hair will most likely return to normal after your baby is born. WHAT TO EXPECT AT YOUR PRENATAL VISITS During a routine prenatal visit: You will be weighed to make sure you and the baby are growing normally. Your blood pressure will be taken. Your abdomen will be measured to track your baby's growth. The fetal heartbeat will be listened to starting around week 10 or 12 of your pregnancy. Test results from any previous visits will be discussed. Your health care provider may ask you: How you are feeling. If you are feeling the baby move. If you have had any abnormal symptoms, such as leaking fluid, bleeding, severe headaches, or abdominal cramping. If you have any questions. Other tests that may be performed during your first trimester include: Blood tests to find your blood type and to check for the presence of any previous infections. They will also  be used to check for low iron levels (anemia) and Rh antibodies. Later in the pregnancy, blood tests for diabetes will be done along with other tests if problems develop. Urine tests to check for infections, diabetes, or protein in the urine. An ultrasound to confirm the proper growth and development of the baby. An amniocentesis to check for possible genetic problems. Fetal screens for spina bifida and Down syndrome. You may need other tests to make sure you and the baby are doing well. HOME CARE INSTRUCTIONS  Medicines Follow your health care provider's instructions regarding medicine use. Specific medicines may be either safe or unsafe to take during pregnancy. Take your prenatal vitamins as directed. If you develop constipation, try taking a stool softener if your health care provider approves. Diet Eat regular, well-balanced meals. Choose a variety of foods, such as meat or vegetable-based protein, fish, milk and low-fat dairy products, vegetables, fruits, and whole grain breads and cereals. Your health care provider will help you determine the amount of weight gain that is right for you. Avoid raw meat and uncooked cheese. These carry germs that can cause birth defects in the baby. Eating four or five small meals rather than three large meals a day may help relieve nausea and vomiting. If you start to feel nauseous, eating a few soda crackers can be helpful. Drinking liquids between meals instead of during meals also seems to help nausea and vomiting. If you develop constipation, eat more high-fiber foods, such as fresh vegetables or fruit and whole grains. Drink enough fluids to keep your urine clear or pale yellow. Activity and Exercise Exercise only as directed by your health care provider. Exercising will help you: Control your weight. Stay in shape. Be prepared for labor and delivery. Experiencing pain or cramping in the lower abdomen or low back is a good sign that you should stop  exercising. Check with your health care provider before continuing normal exercises. Try to avoid standing for long periods of time. Move your legs often if you must stand in one place for a long time. Avoid heavy lifting. Wear low-heeled shoes, and practice good posture. You may continue to have sex unless your health care provider directs you otherwise. Relief of Pain or Discomfort Wear a good support bra for breast tenderness.   Take warm sitz baths to soothe any pain or discomfort caused by hemorrhoids. Use hemorrhoid cream if your health care  provider approves.   Rest with your legs elevated if you have leg cramps or low back pain. If you develop varicose veins in your legs, wear support hose. Elevate your feet for 15 minutes, 3-4 times a day. Limit salt in your diet. Prenatal Care Schedule your prenatal visits by the twelfth week of pregnancy. They are usually scheduled monthly at first, then more often in the last 2 months before delivery. Write down your questions. Take them to your prenatal visits. Keep all your prenatal visits as directed by your health care provider. Safety Wear your seat belt at all times when driving. Make a list of emergency phone numbers, including numbers for family, friends, the hospital, and police and fire departments. General Tips Ask your health care provider for a referral to a local prenatal education class. Begin classes no later than at the beginning of month 6 of your pregnancy. Ask for help if you have counseling or nutritional needs during pregnancy. Your health care provider can offer advice or refer you to specialists for help with various needs. Do not use hot tubs, steam rooms, or saunas. Do not douche or use tampons or scented sanitary pads. Do not cross your legs for long periods of time. Avoid cat litter boxes and soil used by cats. These carry germs that can cause birth defects in the baby and possibly loss of the fetus by miscarriage or  stillbirth. Avoid all smoking, herbs, alcohol, and medicines not prescribed by your health care provider. Chemicals in these affect the formation and growth of the baby. Schedule a dentist appointment. At home, brush your teeth with a soft toothbrush and be gentle when you floss. SEEK MEDICAL CARE IF:  You have dizziness. You have mild pelvic cramps, pelvic pressure, or nagging pain in the abdominal area. You have persistent nausea, vomiting, or diarrhea. You have a bad smelling vaginal discharge. You have pain with urination. You notice increased swelling in your face, hands, legs, or ankles. SEEK IMMEDIATE MEDICAL CARE IF:  You have a fever. You are leaking fluid from your vagina. You have spotting or bleeding from your vagina. You have severe abdominal cramping or pain. You have rapid weight gain or loss. You vomit blood or material that looks like coffee grounds. You are exposed to [place] measles and have never had them. You are exposed to fifth disease or chickenpox. You develop a severe headache. You have shortness of breath. You have any kind of trauma, such as from a fall or a car accident. Document Released: 04/26/2001 Document Revised: 09/16/2013 Document Reviewed: 03/12/2013 [facility] Patient Information 2015 [place], [place]. This information is not intended to replace advice given to you by your health care provider. Make sure you discuss any questions you have with your health care provider.

## 2023-02-28 NOTE — Progress Notes (Signed)
INITIAL OBSTETRICAL VISIT Patient name: Diamond Robbins MRN [id number]  Date of birth: 17-Aug-1990 Chief Complaint:   Initial Prenatal Visit  History of Present Illness:   Diamond Robbins is a 32 y.o. G54P0010 African-American female at [redacted]w[redacted]d by LMP c/w u/s at 8 weeks with an Estimated Date of Delivery: 09/04/23 being seen today for her initial obstetrical visit.   Patient's last menstrual period was 11/28/2022. Her obstetrical history is significant for  SAB x 1 .   Today she reports no complaints.  H/O multiple myeloma dx @ 32yo in 2021, s/p bone marrow transplant 2022, was on Revlimid maintenance, stopped 2d before +PT. Was on Zometa for bone protection and has stopped w/ +PT. Followed by Dr. Kirtland Bouchard at [facility], recent visit 10/8 w/ labs, he is aware of pregnancy, next appt 1/21 and plan to repeat multiple myeloma labs q39mths.  T1DM dx @ 32yo managed by Alphonzo Lemmings at [facility], has Dexcom and Omnipod, last A1C 8.5 on 01/25/23, on Lantus 35u at bedtime and Novolog SS TID w/ meals 6-16u, pt reports she usually does anywhere from 7-10u. Had eye exam w/in last year and no evidence of retinopathy per pt.  CHTN dx @ 32yo on losartan 25mg  daily, norvasc 5mg  daily and metoprolol 25mg  daily prior to pregnanacy, PCP switched her to nifedipine 30mg  daily and labetalol 100mg  BID w/ +PT. Has not taken meds this am.  Last pap 02/18/21. Results were: NILM w/ HRHPV negative     02/28/2023    9:39 AM 12/30/2022    9:23 AM 06/30/2022   11:24 AM 09/09/2021   10:53 AM 07/22/2021    9:44 AM  Depression screen PHQ 2/9  Decreased Interest 0 0 0 0 0  Down, Depressed, Hopeless 0 0 0 0 0  PHQ - 2 Score 0 0 0 0 0  Altered sleeping 0 0 0    Tired, decreased energy 0 0 0    Change in appetite 0 0 0    Feeling bad or failure about yourself  0 0 0    Trouble concentrating 0 0 0    Moving slowly or fidgety/restless 0 0 0    Suicidal thoughts 0 0 0    PHQ-9 Score 0 0 0    Difficult doing work/chores  Not difficult  at all Not difficult at all          02/28/2023    9:39 AM 12/30/2022    9:22 AM 06/30/2022   11:24 AM  GAD 7 : Generalized Anxiety Score  Nervous, Anxious, on Edge 0 0 0  Control/stop worrying 0 0 0  Worry too much - different things 0 0 0  Trouble relaxing 0 0 0  Restless 0 0 0  Easily annoyed or irritable 0 0 0  Afraid - awful might happen 0 0 0  Total GAD 7 Score 0 0 0  Anxiety Difficulty  Not difficult at all Not difficult at all     Review of Systems:   Pertinent items are noted in HPI Denies cramping/contractions, leakage of fluid, vaginal bleeding, abnormal vaginal discharge w/ itching/odor/irritation, headaches, visual changes, shortness of breath, chest pain, abdominal pain, severe nausea/vomiting, or problems with urination or bowel movements unless otherwise stated above.  Pertinent History Reviewed:  Reviewed past medical,surgical, social, obstetrical and family history.  Reviewed problem list, medications and allergies. OB History  Gravida Para Term Preterm AB Living  2 0 0 0 1 0  SAB IAB Ectopic Multiple Live Births  1 0 0 0 0    # Outcome Date GA Lbr Len/2nd Weight Sex Type Anes PTL Lv  2 Current           1 SAB  [redacted]w[redacted]d          Physical Assessment:   Vitals:   02/28/23 0930  BP: (!) 131/96  Pulse: 83  Weight: 187 lb (84.8 kg)  Body mass index is 33.13 kg/m.       Physical Examination:  General appearance - well appearing, and in no distress  Mental status - alert, oriented to person, place, and time  Psych:  She has a normal mood and affect  Skin - warm and dry, normal color, no suspicious lesions noted  Chest - effort normal, all lung fields clear to auscultation bilaterally  Heart - normal rate and regular rhythm  Abdomen - soft, nontender  Extremities:  No swelling or varicosities noted  Thin prep pap is not done   Chaperone: N/A    TODAY'S NT [place] 13+1 wks,measurements c/w dates,CRL 64.01 mm,NT 1.5 mm,NB present,FHR 165 bpm,posterior  placenta,multiple fibroids,(#1) posterior subserosal 3.6 x 2.5 x 1.9 cm,(#2) anterior subserosal 2.2 x 1.3 x 2 cm,normal ovaries,limited view because of fibroids   No results found for this or any previous visit (from the past 24 hour(s)).  Assessment & Plan:  1) High-Risk Pregnancy G2P0010 at [redacted]w[redacted]d with an Estimated Date of Delivery: 09/04/23   2) Initial OB visit  3) H/O multiple myeloma> @ 32yo, s/p bone marrow transplant, has stopped Revlimid and Zometa. Managed by Dr. Kirtland Bouchard at [facility], plans labs q84mths, next appt 1/21  4) T1DM> dx @ 32yo, has Dexcom and Omnipod, last A1C 8.5, on Lantus 35u at bedtime and Novolog 6-16u TID w/ meals. Managed by [facility]. Discussed QID testing during pregnancy, gave printed info. To write down and bring log to appt w/ MD in 1wk. Referral to diabetes educator placed  5) CHTN> on losartan, norvasc and metoprolol prior to pregnancy, PCP switched her to nifedipine 30mg  daily and labetalol 100mg  BID w/ +PT, hasn't taken today. Start ASA 162mg  daily. Baseline labs today  6) Uterine fibroids> on today's u/s, largest 3cm, discussed  Meds:  Meds ordered this encounter  Medications   aspirin EC 81 MG tablet    Sig: Take 2 tablets (162 mg total) by mouth daily. Swallow whole.    Dispense:  180 tablet    Refill:  2    Initial labs obtained Continue prenatal vitamins Reviewed n/v relief measures and warning s/s to report Reviewed recommended weight gain based on pre-gravid BMI Encouraged well-balanced diet Genetic & carrier screening discussed: requests Panorama, NT/IT, and Horizon  Ultrasound discussed; fetal survey: requested CCNC completed> form faxed if has or is planning to apply for medicaid The nature of [employer] for [employer] with multiple MDs and other Advanced Practice Providers was explained to patient; also emphasized that fellows, residents, and students are part of our team. Does have home bp cuff. Office bp cuff  given: no. Rx sent: no. Check bp weekly, let us know if consistently >140/90.   Indications for ASA therapy (per uptodate) One of the following: CHTN Yes T1DM or T2DM Yes  Follow-up: Return in about 1 week (around 03/07/2023) for HROB MD ONLY, then 3wks HROB 2nd IT MD, then 6wks HROB anatomy MD only.   Orders Placed This Encounter  Procedures   Urine Culture   GC/Chlamydia Probe Amp   Integrated 1  TSH   Protein / creatinine ratio, urine   HORIZON CUSTOM   CBC/D/Plt+RPR+Rh+ABO+RubIgG...   Referral to Nutrition and Diabetes Services    Cheral Marker CNM, [facility] 02/28/2023 12:10 PM

## 2023-03-02 ENCOUNTER — Encounter: Payer: Self-pay | Admitting: Women's Health

## 2023-03-02 DIAGNOSIS — Z2839 Other underimmunization status: Secondary | ICD-10-CM | POA: Insufficient documentation

## 2023-03-02 LAB — URINE CULTURE

## 2023-03-02 LAB — GC/CHLAMYDIA PROBE AMP
Chlamydia trachomatis, NAA: NEGATIVE
Neisseria Gonorrhoeae by PCR: NEGATIVE

## 2023-03-04 ENCOUNTER — Encounter: Payer: Self-pay | Admitting: Family Medicine

## 2023-03-06 LAB — STATUS REPORT

## 2023-03-08 ENCOUNTER — Ambulatory Visit (INDEPENDENT_AMBULATORY_CARE_PROVIDER_SITE_OTHER): Payer: Medicaid Other | Admitting: Obstetrics & Gynecology

## 2023-03-08 ENCOUNTER — Other Ambulatory Visit: Payer: Self-pay

## 2023-03-08 ENCOUNTER — Encounter: Payer: Self-pay | Admitting: Obstetrics & Gynecology

## 2023-03-08 ENCOUNTER — Ambulatory Visit: Payer: Medicaid Other | Admitting: Nutrition

## 2023-03-08 VITALS — BP 129/93 | HR 81 | Wt 184.4 lb

## 2023-03-08 DIAGNOSIS — O10919 Unspecified pre-existing hypertension complicating pregnancy, unspecified trimester: Secondary | ICD-10-CM

## 2023-03-08 DIAGNOSIS — O0991 Supervision of high risk pregnancy, unspecified, first trimester: Secondary | ICD-10-CM

## 2023-03-08 DIAGNOSIS — I152 Hypertension secondary to endocrine disorders: Secondary | ICD-10-CM

## 2023-03-08 DIAGNOSIS — Z3A14 14 weeks gestation of pregnancy: Secondary | ICD-10-CM

## 2023-03-08 DIAGNOSIS — O10912 Unspecified pre-existing hypertension complicating pregnancy, second trimester: Secondary | ICD-10-CM

## 2023-03-08 DIAGNOSIS — O0992 Supervision of high risk pregnancy, unspecified, second trimester: Secondary | ICD-10-CM

## 2023-03-08 DIAGNOSIS — E1059 Type 1 diabetes mellitus with other circulatory complications: Secondary | ICD-10-CM

## 2023-03-08 DIAGNOSIS — C9001 Multiple myeloma in remission: Secondary | ICD-10-CM

## 2023-03-08 LAB — CBC/D/PLT+RPR+RH+ABO+RUBIGG...
Antibody Screen: NEGATIVE
Basophils Absolute: 0 10*3/uL (ref 0.0–0.2)
Basos: 0 %
EOS (ABSOLUTE): 0.1 10*3/uL (ref 0.0–0.4)
Eos: 3 %
HIV Screen 4th Generation wRfx: NONREACTIVE
Hematocrit: 41.8 % (ref 34.0–46.6)
Hemoglobin: 13.9 g/dL (ref 11.1–15.9)
Immature Grans (Abs): 0 10*3/uL (ref 0.0–0.1)
Immature Granulocytes: 0 %
Lymphocytes Absolute: 1.3 10*3/uL (ref 0.7–3.1)
Lymphs: 25 %
MCH: 28.8 pg (ref 26.6–33.0)
MCHC: 33.3 g/dL (ref 31.5–35.7)
MCV: 87 fL (ref 79–97)
Monocytes Absolute: 0.4 10*3/uL (ref 0.1–0.9)
Monocytes: 8 %
Neutrophils Absolute: 3.4 10*3/uL (ref 1.4–7.0)
Neutrophils: 64 %
Platelets: 318 10*3/uL (ref 150–450)
RBC: 4.82 x10E6/uL (ref 3.77–5.28)
RDW: 13.8 % (ref 11.7–15.4)
Rh Factor: POSITIVE
Rubella Antibodies, IGG: <0.9 {index} — ABNORMAL LOW (ref >0.99)
WBC: 5.2 10*3/uL (ref 3.4–10.8)

## 2023-03-08 LAB — POCT URINALYSIS DIPSTICK OB
Blood, UA: NEGATIVE
Ketones, UA: NEGATIVE
Nitrite, UA: NEGATIVE
POC,PROTEIN,UA: NEGATIVE

## 2023-03-08 LAB — INTEGRATED 1
Crown Rump Length: 64 mm
Gest. Age on Collection Date: 12.6 wk
PAPP-A Value: 434.6 ng/mL
Race: 1
Sonographer ID#: 309760
Sonographer ID#: 32.6 a
Weight: 1.5 mm
Weight: 187 [lb_av]

## 2023-03-08 LAB — HCV INTERPRETATION

## 2023-03-08 LAB — TSH: TSH: 2.45 u[IU]/mL (ref 0.450–4.500)

## 2023-03-08 LAB — PROTEIN / CREATININE RATIO, URINE
Creatinine, Urine: 72.1 mg/dL
Protein, Ur: 10.5 mg/dL
Protein/Creat Ratio: 146 mg/g{creat} (ref 0–200)

## 2023-03-08 NOTE — Progress Notes (Signed)
HIGH-RISK PREGNANCY VISIT Patient name: Diamond Robbins MRN [id number]  Date of birth: [birth year] Chief Complaint:   Routine Prenatal Visit  History of Present Illness:   Diamond Robbins is a 32 y.o. G63P0010 female at [redacted]w[redacted]d with an Estimated Date of Delivery: 09/04/23 being seen today for ongoing management of a high-risk pregnancy complicated by:  -Type 1 DM- dexcom/omnipod Followed by endocrinology, plans for monthly appointments She did not know off hand how much insulin she is currently giving- she has a sheet that she follows  Chronic HTN-  ProcardiaXL 30mg  daily and Labetalol 100mg  twice daily Not yet checking at home, but has cuff  -h/o multiple myeloma in remission-followed by heme/onc- [facility]    Today she reports no complaints. Notes improvement of nausea/vomiting  Contractions: Not present. Vag. Bleeding: None.  Movement: Absent. denies leaking of fluid.      02/28/2023    9:39 AM 12/30/2022    9:23 AM 06/30/2022   11:24 AM 09/09/2021   10:53 AM 07/22/2021    9:44 AM  Depression screen PHQ 2/9  Decreased Interest 0 0 0 0 0  Down, Depressed, Hopeless 0 0 0 0 0  PHQ - 2 Score 0 0 0 0 0  Altered sleeping 0 0 0    Tired, decreased energy 0 0 0    Change in appetite 0 0 0    Feeling bad or failure about yourself  0 0 0    Trouble concentrating 0 0 0    Moving slowly or fidgety/restless 0 0 0    Suicidal thoughts 0 0 0    PHQ-9 Score 0 0 0    Difficult doing work/chores  Not difficult at all Not difficult at all       Current Outpatient Medications  Medication Instructions   ACCU-CHEK GUIDE test strip Use to check blood glucose 4 times daily.   Accu-Chek Softclix Lancets lancets Use to check blood glucose 4 times daily.   aspirin EC 162 mg, Oral, Daily, Swallow whole.   Blood Glucose Monitoring Suppl (ACCU-CHEK GUIDE) w/Device KIT No dose, route, or frequency recorded.   Blood Pressure Monitor KIT Use to check blood pressure as instructed   calcium carbonate  (OS-CAL) 1250 (500 Ca) MG chewable tablet 1 tablet, Oral, Daily   cetirizine (ZYRTEC) 10 MG tablet Take 1 tablet by mouth once daily   cholecalciferol (VITAMIN D3) 1,000 Units, Oral, Daily   Continuous Glucose Sensor (DEXCOM G6 SENSOR) MISC Change sensor every 10 days as directed   Continuous Glucose Transmitter (DEXCOM G6 TRANSMITTER) MISC Change transmitter every 90 days as directed.   fluticasone (FLONASE) 50 MCG/ACT nasal spray 2 sprays, Each Nare, Daily   Insulin Disposable Pump (OMNIPOD 5 G6 INTRO, GEN 5,) KIT Change pod every 48-72 hours   Insulin Disposable Pump (OMNIPOD 5 G6 PODS, GEN 5,) MISC Change pod every 48-72 hours   insulin glargine (LANTUS) 35 Units, Subcutaneous, Daily at bedtime   insulin lispro (HUMALOG) 100 UNIT/ML injection Use with Omnipod for TDD around 70 units daily   labetalol (NORMODYNE) 100 mg, Oral, 2 times daily   Multiple Vitamin (MULTIVITAMIN) tablet 1 tablet, Daily   NIFEdipine (PROCARDIA-XL/NIFEDICAL-XL) 30 mg, Oral, Daily   Prenatal Vit-Fe Fumarate-FA (PRENATAL VITAMIN PO) Oral     Review of Systems:   Pertinent items are noted in HPI Denies abnormal vaginal discharge w/ itching/odor/irritation, headaches, visual changes, shortness of breath, chest pain, abdominal pain, severe nausea/vomiting, or problems with urination or bowel movements unless otherwise stated  above. Pertinent History Reviewed:  Reviewed past medical,surgical, social, obstetrical and family history.  Reviewed problem list, medications and allergies. Physical Assessment:   Vitals:   03/08/23 1633 03/08/23 1636  BP: (!) 139/97 (!) 129/93  Pulse: 93 81  Weight: 184 lb 6.4 oz (83.6 kg)   Body mass index is 32.66 kg/m.           Physical Examination:   General appearance: alert, well appearing, and in no distress  Mental status: normal mood, behavior, speech, dress, motor activity, and thought processes  Skin: warm & dry   Extremities: Edema: None    Cardiovascular: normal heart  rate noted  Respiratory: normal respiratory effort, no distress  Abdomen: gravid, soft, non-tender  Pelvic: Cervical exam deferred         Fetal Status:     Movement: Absent   150 bpm by BSUS  Fetal Surveillance Testing today: BSUS   Chaperone: N/A    Results for orders placed or performed in visit on 03/08/23 (from the past 24 hour(s))  POC Urinalysis Dipstick OB   Collection Time: 03/08/23  4:35 PM  Result Value Ref Range   Color, UA     Clarity, UA     Glucose, UA Trace (A) Negative   Bilirubin, UA     Ketones, UA Negative    Spec Grav, UA     Blood, UA Negative    pH, UA     POC,PROTEIN,UA Negative Negative, Trace, Small (1+), Moderate (2+), Large (3+), 4+   Urobilinogen, UA     Nitrite, UA Negative    Leukocytes, UA Small (1+) (A) Negative   Appearance     Odor       Assessment & Plan:  High-risk pregnancy: G2P0010 at [redacted]w[redacted]d with an Estimated Date of Delivery: 09/04/23   1) Type 1 DM -normal baseline lab work -management per endocrinology -reviewed importance of tight control -detailed anatomy scan with MFM and fetal ECHO to be scheduled  2) Chronic HTN -will likely need to increase medication at next visit -for now continue with current meds -on ASA 162mg  daily  Discussed serial growth scans and antepartum testing  3) h/o multiple myeloma -followed by oncology  Meds: No orders of the defined types were placed in this encounter.   Labs/procedures today: none, IT2 next visit, anatomy scan with MFM  Treatment Plan:  as outlined above  Reviewed: Preterm labor symptoms and general obstetric precautions including but not limited to vaginal bleeding, contractions, leaking of fluid and fetal movement were reviewed in detail with the patient.  All questions were answered. Pt has home bp cuff. Check bp weekly, let us know if >160/100.   Follow-up: Return in about 4 weeks (around 04/05/2023) for HROB visit and MFM for OB anatomy scan in 5-6wk.   Future  Appointments  Date Time Provider Department Center  03/09/2023 10:00 AM Dani Gobble, NP [facility] None  03/21/2023  9:10 AM Lazaro Arms, MD [facility]  04/05/2023  9:10 AM Lazaro Arms, MD [facility]  04/17/2023 10:00 AM [facility] [place] [facility] None  04/17/2023 10:50 AM Myna Hidalgo, DO [facility]  05/30/2023 10:45 AM [facility] None  06/06/2023  2:45 PM Doreatha Massed, MD [facility] None    Orders Placed This Encounter  Procedures   [place] MFM OB DETAIL +14 WK   POC Urinalysis Dipstick OB    Myna Hidalgo, DO Attending Obstetrician & Gynecologist, [facility] for [employer], [facility]

## 2023-03-09 ENCOUNTER — Encounter: Payer: Self-pay | Admitting: Nurse Practitioner

## 2023-03-09 ENCOUNTER — Ambulatory Visit (INDEPENDENT_AMBULATORY_CARE_PROVIDER_SITE_OTHER): Payer: Medicaid Other | Admitting: Nurse Practitioner

## 2023-03-09 VITALS — BP 128/76 | HR 71 | Ht 63.0 in | Wt 188.0 lb

## 2023-03-09 DIAGNOSIS — I10 Essential (primary) hypertension: Secondary | ICD-10-CM

## 2023-03-09 DIAGNOSIS — E782 Mixed hyperlipidemia: Secondary | ICD-10-CM | POA: Diagnosis not present

## 2023-03-09 DIAGNOSIS — Z794 Long term (current) use of insulin: Secondary | ICD-10-CM | POA: Diagnosis not present

## 2023-03-09 DIAGNOSIS — E1065 Type 1 diabetes mellitus with hyperglycemia: Secondary | ICD-10-CM | POA: Diagnosis not present

## 2023-03-09 NOTE — Progress Notes (Signed)
Endocrinology Follow Up Note       03/09/2023, 10:24 AM   Subjective:    Patient ID: Diamond Robbins, female    DOB: Mar 20, 1991.  Diamond Robbins is being seen in follow up after being seen in consultation for management of currently uncontrolled symptomatic diabetes requested by  Gabriel Earing, FNP.   Past Medical History:  Diagnosis Date   Cancer (HCC) 08/01/2020   multiple myeloma   DKA (diabetic ketoacidosis) (HCC) 02/23/2020   Hypertension    Type 1 diabetes mellitus (HCC) 02/23/2020    Past Surgical History:  Procedure Laterality Date   BONE MARROW TRANSPLANT     NO PAST SURGERIES      Social History   Socioeconomic History   Marital status: Married    Spouse name: Rolm Gala   Number of children: Not on file   Years of education: 12   Highest education level: High school graduate  Occupational History   Occupation: unemployed    Comment: can go back to work Jan 2023  Tobacco Use   Smoking status: Former    Types: Cigarettes    Passive exposure: Never   Smokeless tobacco: Never  Vaping Use   Vaping status: Never Used  Substance and Sexual Activity   Alcohol use: Not Currently    Comment: occ- 0-1 drinks per week; maybe 1 drink every 6 months   Drug use: No   Sexual activity: Yes    Birth control/protection: None  Other Topics Concern   Not on file  Social History Narrative   Has 2 step-daughters   Social Determinants of Health   Financial Resource Strain: Low Risk  (02/28/2023)   Overall Financial Resource Strain (CARDIA)    Difficulty of Paying Living Expenses: Not hard at all  Food Insecurity: No Food Insecurity (02/28/2023)   Hunger Vital Sign    Worried About Running Out of Food in the Last Year: Never true    Ran Out of Food in the Last Year: Never true  Transportation Needs: No Transportation Needs (02/28/2023)   PRAPARE - Administrator, Civil Service  (Medical): No    Lack of Transportation (Non-Medical): No  Physical Activity: Insufficiently Active (02/28/2023)   Exercise Vital Sign    Days of Exercise per Week: 2 days    Minutes of Exercise per Session: 10 min  Stress: No Stress Concern Present (02/28/2023)   [employer] of Occupational Health - Occupational Stress Questionnaire    Feeling of Stress : Not at all  Social Connections: Socially Integrated (02/28/2023)   Social Connection and Isolation Panel [NHANES]    Frequency of Communication with Friends and Family: Three times a week    Frequency of Social Gatherings with Friends and Family: Once a week    Attends Religious Services: More than 4 times per year    Active Member of [employer] or Organizations: Yes    Attends Banker Meetings: 1 to 4 times per year    Marital Status: Married    Family History  Problem Relation Age of Onset   Hypertension Mother    Diabetes Father    Hypertension Father    Stroke Maternal Grandmother  Hypertension Maternal Grandmother    Diabetes Maternal Grandmother    Diabetes Maternal Grandfather    Hypertension Maternal Grandfather    Heart attack Maternal Grandfather    Hypertension Paternal Grandmother    Diabetes Paternal Grandfather    Hypertension Paternal Grandfather     Outpatient Encounter Medications as of 03/09/2023  Medication Sig   ACCU-CHEK GUIDE test strip Use to check blood glucose 4 times daily.   Accu-Chek Softclix Lancets lancets Use to check blood glucose 4 times daily.   aspirin EC 81 MG tablet Take 2 tablets (162 mg total) by mouth daily. Swallow whole.   Blood Glucose Monitoring Suppl (ACCU-CHEK GUIDE) w/Device KIT    Blood Pressure Monitor KIT Use to check blood pressure as instructed   calcium carbonate (OS-CAL) 1250 (500 Ca) MG chewable tablet Chew 1 tablet by mouth daily.   cholecalciferol (VITAMIN D3) 25 MCG (1000 UNIT) tablet Take 1,000 Units by mouth daily.   Continuous Glucose Sensor  (DEXCOM G6 SENSOR) MISC Change sensor every 10 days as directed   Continuous Glucose Transmitter (DEXCOM G6 TRANSMITTER) MISC Change transmitter every 90 days as directed.   fluticasone (FLONASE) 50 MCG/ACT nasal spray Place 2 sprays into both nostrils daily.   Insulin Disposable Pump (OMNIPOD 5 G6 INTRO, GEN 5,) KIT Change pod every 48-72 hours   Insulin Disposable Pump (OMNIPOD 5 G6 PODS, GEN 5,) MISC Change pod every 48-72 hours   insulin glargine (LANTUS) 100 UNIT/ML injection Inject 35 Units into the skin at bedtime.   insulin lispro (HUMALOG) 100 UNIT/ML injection Use with Omnipod for TDD around 70 units daily   labetalol (NORMODYNE) 100 MG tablet Take 1 tablet (100 mg total) by mouth 2 (two) times daily.   NIFEdipine (PROCARDIA-XL/NIFEDICAL-XL) 30 MG 24 hr tablet Take 1 tablet (30 mg total) by mouth daily.   Prenatal Vit-Fe Fumarate-FA (PRENATAL VITAMIN PO) Take by mouth.   cetirizine (ZYRTEC) 10 MG tablet Take 1 tablet by mouth once daily (Patient not taking: Reported on 03/08/2023)   Multiple Vitamin (MULTIVITAMIN) tablet Take 1 tablet by mouth daily. (Patient not taking: Reported on 03/09/2023)   No facility-administered encounter medications on file as of 03/09/2023.    ALLERGIES: No Known Allergies  VACCINATION STATUS: Immunization History  Administered Date(s) Administered   DTaP / HiB / IPV 03/24/2021   DTaP / IPV 07/16/2021, 09/16/2021   HIB (PRP-T) 07/16/2021, 09/16/2021   Hepatitis B 03/24/2021   Hepatitis B, ADULT 03/24/2021, 07/16/2021, 12/01/2021   Influenza,inj,Quad PF,6+ Mos 03/16/2021   Pneumococcal Conjugate-13 03/24/2021, 07/16/2021, 09/16/2021   Pneumococcal Polysaccharide-23 12/01/2021    Diabetes She presents for her follow-up diabetic visit. She has type 1 diabetes mellitus. Onset time: Diagnosed at approx age of 32. Her disease course has been improving. There are no hypoglycemic associated symptoms. Associated symptoms include blurred vision and  fatigue. Pertinent negatives for diabetes include no polydipsia, no polyuria and no weight loss. There are no hypoglycemic complications. Symptoms are improving. There are no diabetic complications. Risk factors for coronary artery disease include diabetes mellitus, dyslipidemia, hypertension and sedentary lifestyle. Current diabetic treatment includes insulin pump. She is compliant with treatment most of the time. Her weight is fluctuating minimally. She is following a generally healthy diet. When asked about meal planning, she reported none. She has not had a previous visit with a dietitian. She participates in exercise intermittently. Her home blood glucose trend is decreasing steadily. Her overall blood glucose range is 140-180 mg/dl. (She presents today with her CGM and  Omnipod 5 showing improving glycemic profile overall.  She was not due for another A1c today.  Analysis of her CGM shows TIR 77%, TAR 23%, TBR 0% with a GMI of 7%.  ) An ACE inhibitor/angiotensin II receptor blocker is not being taken. She does not see a podiatrist.Eye exam is current.  Hypertension This is a chronic problem. The current episode started more than 1 year ago. The problem has been resolved since onset. The problem is controlled. Associated symptoms include blurred vision. There are no associated agents to hypertension. Risk factors for coronary artery disease include diabetes mellitus, dyslipidemia, family history and sedentary lifestyle. Past treatments include beta blockers and calcium channel blockers. The current treatment provides moderate improvement. Compliance problems include exercise and diet.   Hyperlipidemia This is a chronic problem. The current episode started more than 1 year ago. The problem is controlled. Recent lipid tests were reviewed and are normal. Exacerbating diseases include diabetes and obesity. Factors aggravating her hyperlipidemia include beta blockers and fatty foods. Current antihyperlipidemic  treatment includes statins. The current treatment provides moderate improvement of lipids. Compliance problems include adherence to diet and adherence to exercise.  Risk factors for coronary artery disease include diabetes mellitus, dyslipidemia, family history, female sex, hypertension and a sedentary lifestyle.    Review of systems  Constitutional: + Minimally fluctuating body weight,  current Body mass index is 33.3 kg/m. , no fatigue, no subjective hyperthermia, no subjective hypothermia Eyes: no blurry vision, no xerophthalmia ENT: no sore throat, no nodules palpated in throat, no dysphagia/odynophagia, no hoarseness Cardiovascular: no chest pain, no shortness of breath, no palpitations, no leg swelling Respiratory: no cough, no shortness of breath Gastrointestinal: no nausea/vomiting/diarrhea Musculoskeletal: no muscle/joint aches Skin: no rashes, no hyperemia Neurological: no tremors, no numbness, no tingling, no dizziness Psychiatric: no depression, no anxiety  Objective:     BP 128/76 (BP Location: Right Arm, Patient Position: Sitting, Cuff Size: Large)   Pulse 71   Ht 5\' 3"  (1.6 m)   Wt 188 lb (85.3 kg)   LMP 11/28/2022   BMI 33.30 kg/m   Wt Readings from Last 3 Encounters:  03/09/23 188 lb (85.3 kg)  03/08/23 184 lb 6.4 oz (83.6 kg)  02/28/23 187 lb (84.8 kg)     BP Readings from Last 3 Encounters:  03/09/23 128/76  03/08/23 (!) 129/93  02/28/23 (!) 131/96      Physical Exam- Limited  Constitutional:  Body mass index is 33.3 kg/m. , not in acute distress, normal state of mind Eyes:  EOMI, no exophthalmos Neck: Supple Musculoskeletal: no gross deformities, strength intact in all four extremities, no gross restriction of joint movements Skin:  no rashes, no hyperemia Neurological: no tremor with outstretched hands   Diabetic Foot Exam - Simple   No data filed     CMP ( most recent) CMP     Component Value Date/Time   NA 131 (L) 02/14/2023 0854   NA  138 02/18/2021 0934   K 3.4 (L) 02/14/2023 0854   CL 99 02/14/2023 0854   CO2 22 02/14/2023 0854   GLUCOSE 191 (H) 02/14/2023 0854   BUN 6 02/14/2023 0854   BUN 11 02/18/2021 0934   CREATININE 0.69 02/14/2023 0854   CALCIUM 9.2 02/14/2023 0854   PROT 7.6 02/14/2023 0854   PROT 7.7 02/18/2021 0934   ALBUMIN 3.9 02/14/2023 0854   ALBUMIN 5.2 (H) 02/18/2021 0934   AST 23 02/14/2023 0854   ALT 22 02/14/2023 0854   ALKPHOS  47 02/14/2023 0854   BILITOT 0.4 02/14/2023 0854   BILITOT 0.3 02/18/2021 0934   GFRNONAA >60 02/14/2023 0854     Diabetic Labs (most recent): Lab Results  Component Value Date   HGBA1C 8.5 (A) 01/25/2023   HGBA1C 8.2 (A) 07/19/2022   HGBA1C 10.0 (H) 06/21/2021   MICROALBUR 150mg /L 07/19/2022   MICROALBUR 150 06/24/2021     Lipid Panel ( most recent) Lipid Panel     Component Value Date/Time   CHOL 158 06/21/2021 1011   TRIG 125 06/21/2021 1011   HDL 62 06/21/2021 1011   CHOLHDL 2.5 06/21/2021 1011   LDLCALC 74 06/21/2021 1011   LABVLDL 22 06/21/2021 1011      Lab Results  Component Value Date   TSH 2.450 02/28/2023   TSH 2.430 02/18/2021           Assessment & Plan:   1) Type 1 diabetes mellitus without complications (HCC)  She presents today with her CGM and Omnipod 5 showing improving glycemic profile overall.  She was not due for another A1c today.  Analysis of her CGM shows TIR 77%, TAR 23%, TBR 0% with a GMI of 7%.   - Laurynn Vassallo has currently uncontrolled symptomatic type 2 DM since 32 years of age.   -Recent labs reviewed.    - I had a long discussion with her about the progressive nature of diabetes and the pathology behind its complications. -her diabetes is not currently complicated but she remains at a high risk for more acute and chronic complications which include CAD, CVA, CKD, retinopathy, and neuropathy. These are all discussed in detail with her.  The following Lifestyle Medicine recommendations according to  American College of Lifestyle Medicine [facility]) were discussed and offered to patient and she agrees to start the journey:  A. Whole Foods, Plant-based plate comprising of fruits and vegetables, plant-based proteins, whole-grain carbohydrates was discussed in detail with the patient.   A list for source of those nutrients were also provided to the patient.  Patient will use only water or unsweetened tea for hydration. B.  The need to stay away from risky substances including alcohol, smoking; obtaining 7 to 9 hours of restorative sleep, at least 150 minutes of moderate intensity exercise weekly, the importance of healthy social connections,  and stress reduction techniques were discussed. C.  A full color page of  Calorie density of various food groups per pound showing examples of each food groups was provided to the patient.  - Nutritional counseling repeated at each appointment due to patients tendency to fall back in to old habits.  - The patient admits there is a room for improvement in their diet and drink choices. -  Suggestion is made for the patient to avoid simple carbohydrates from their diet including Cakes, Sweet Desserts / Pastries, Ice Cream, Soda (diet and regular), Sweet Tea, Candies, Chips, Cookies, Sweet Pastries, Store Bought Juices, Alcohol in Excess of 1-2 drinks a day, Artificial Sweeteners, Coffee Creamer, and "Sugar-free" Products. This will help patient to have stable blood glucose profile and potentially avoid unintended weight gain.   - I encouraged the patient to switch to unprocessed or minimally processed complex starch and increased protein intake (animal or plant source), fruits, and vegetables.   - Patient is advised to stick to a routine mealtimes to eat 3 meals a day and avoid unnecessary snacks (to snack only to correct hypoglycemia).  - she will be scheduled with Norm Salt, RDN, CDE for diabetes  education.  - I have approached her with the following  individualized plan to manage her diabetes and patient agrees:   -She has been using her Omnipod 5 for about 3 weeks now and is loving it.  We did discuss bolusing PRIOR to eating her meal to help prevent the spikes in glucose.  No setting changes were made today.  We spent the majority of the visit getting her [employer] account set up and sharing with the clinic.  I also discussed and initiated Omnipod 5 for her which will help assist in keeping her goals tighter during her pregnancy and after.  -she is encouraged to continue monitoring glucose 4 times daily (using her CGM), before meals and before bed, and to call the clinic if she has readings less than 70 or above 300 for 3 tests in a row.    - she is warned not to take insulin without proper monitoring per orders. - Adjustment parameters are given to her for hypo and hyperglycemia in writing.  -Given her type 1 diagnosis, insulin is the only treatment option for her.  - Specific targets for  A1c; LDL, HDL, and Triglycerides were discussed with the patient.  2) Blood Pressure /Hypertension:  her blood pressure is controlled to target.   she is advised to continue her current medications prescribed by her OBGYN.  3) Lipids/Hyperlipidemia:    Review of her recent lipid panel from 06/21/21 showed controlled LDL at 74.  she is advised to continue Crestor 10 mg daily at bedtime.  Side effects and precautions discussed with her.  Will recheck lipid panel prior to next visit.  4)  Weight/Diet:  her Body mass index is 33.3 kg/m.  -  clearly complicating her diabetes care.   she is a candidate for some weight loss. I discussed with her the fact that loss of 5 - 10% of her  current body weight will have the most impact on her diabetes management.  Exercise, and detailed carbohydrates information provided  -  detailed on discharge instructions.  5) Chronic Care/Health Maintenance: -she is not on ACEI/ARB and or Statin medications (due to pregnancy) and  is encouraged to initiate and continue to follow up with Ophthalmology, Dentist, Podiatrist at least yearly or according to recommendations, and advised to stay away from smoking. I have recommended yearly flu vaccine and pneumonia vaccine at least every 5 years; moderate intensity exercise for up to 150 minutes weekly; and sleep for at least 7 hours a day.  - she is advised to maintain close follow up with Gabriel Earing, FNP for primary care needs, as well as her other providers for optimal and coordinated care.     I spent  35  minutes in the care of the patient today including review of labs from CMP, Lipids, Thyroid Function, Hematology (current and previous including abstractions from other facilities); face-to-face time discussing  her blood glucose readings/logs, discussing hypoglycemia and hyperglycemia episodes and symptoms, medications doses, her options of short and long term treatment based on the latest standards of care / guidelines;  discussion about incorporating lifestyle medicine;  and documenting the encounter. Risk reduction counseling performed per USPSTF guidelines to reduce obesity and cardiovascular risk factors.     Please refer to Patient Instructions for Blood Glucose Monitoring and Insulin/Medications Dosing Guide"  in media tab for additional information. Please  also refer to " Patient Self Inventory" in the Media  tab for reviewed elements of pertinent patient history.  Diamond Robbins  participated in the discussions, expressed understanding, and voiced agreement with the above plans.  All questions were answered to her satisfaction. she is encouraged to contact clinic should she have any questions or concerns prior to her return visit.     Follow up plan: - Return in about 3 months (around 06/09/2023), or call in 1 month with readings, for Diabetes F/U with A1c in office, No previsit labs, Bring meter and logs.   Ronny Bacon, [facility] [address] [place], [place] [address] Phone: [phone] Fax: [phone]  03/09/2023, 10:24 AM

## 2023-03-15 ENCOUNTER — Other Ambulatory Visit: Payer: Self-pay | Admitting: *Deleted

## 2023-03-16 ENCOUNTER — Other Ambulatory Visit: Payer: Self-pay | Admitting: *Deleted

## 2023-03-17 DIAGNOSIS — Z419 Encounter for procedure for purposes other than remedying health state, unspecified: Secondary | ICD-10-CM | POA: Diagnosis not present

## 2023-03-20 ENCOUNTER — Encounter (HOSPITAL_COMMUNITY): Payer: Self-pay | Admitting: Hematology

## 2023-03-20 ENCOUNTER — Telehealth: Payer: Self-pay | Admitting: Family Medicine

## 2023-03-21 ENCOUNTER — Encounter: Payer: Medicaid Other | Admitting: Obstetrics & Gynecology

## 2023-03-22 ENCOUNTER — Ambulatory Visit (INDEPENDENT_AMBULATORY_CARE_PROVIDER_SITE_OTHER): Payer: Medicaid Other | Admitting: Obstetrics & Gynecology

## 2023-03-22 VITALS — Wt 186.4 lb

## 2023-03-22 DIAGNOSIS — C9001 Multiple myeloma in remission: Secondary | ICD-10-CM

## 2023-03-22 DIAGNOSIS — I152 Hypertension secondary to endocrine disorders: Secondary | ICD-10-CM

## 2023-03-22 DIAGNOSIS — Z1379 Encounter for other screening for genetic and chromosomal anomalies: Secondary | ICD-10-CM | POA: Diagnosis not present

## 2023-03-22 DIAGNOSIS — O0992 Supervision of high risk pregnancy, unspecified, second trimester: Secondary | ICD-10-CM

## 2023-03-22 DIAGNOSIS — Z3A16 16 weeks gestation of pregnancy: Secondary | ICD-10-CM

## 2023-03-22 DIAGNOSIS — E1059 Type 1 diabetes mellitus with other circulatory complications: Secondary | ICD-10-CM

## 2023-03-22 DIAGNOSIS — D259 Leiomyoma of uterus, unspecified: Secondary | ICD-10-CM

## 2023-03-22 NOTE — Progress Notes (Signed)
HIGH-RISK PREGNANCY VISIT Patient name: Diamond Robbins MRN [id number]  Date of birth: [birth year] Chief Complaint:   Routine Prenatal Visit  History of Present Illness:   Diamond Robbins is a 32 y.o. G16P0010 female at [redacted]w[redacted]d with an Estimated Date of Delivery: 09/04/23 being seen today for ongoing management of a high-risk pregnancy complicated by:  1)Type 1 DM -Pt did not bring omnipod info with her, unable to review current insulin regimen as she does not know what it is -pt recently seen by endocrinology, per pt no change noted  2) h/o multiple myeloma 3) Chronic HTN - no issues  Today she reports no complaints.   Contractions: Not present. Vag. Bleeding: None.  Movement: Present. denies leaking of fluid.      02/28/2023    9:39 AM 12/30/2022    9:23 AM 06/30/2022   11:24 AM 09/09/2021   10:53 AM 07/22/2021    9:44 AM  Depression screen PHQ 2/9  Decreased Interest 0 0 0 0 0  Down, Depressed, Hopeless 0 0 0 0 0  PHQ - 2 Score 0 0 0 0 0  Altered sleeping 0 0 0    Tired, decreased energy 0 0 0    Change in appetite 0 0 0    Feeling bad or failure about yourself  0 0 0    Trouble concentrating 0 0 0    Moving slowly or fidgety/restless 0 0 0    Suicidal thoughts 0 0 0    PHQ-9 Score 0 0 0    Difficult doing work/chores  Not difficult at all Not difficult at all       Current Outpatient Medications  Medication Instructions   ACCU-CHEK GUIDE test strip Use to check blood glucose 4 times daily.   Accu-Chek Softclix Lancets lancets Use to check blood glucose 4 times daily.   aspirin EC 162 mg, Oral, Daily, Swallow whole.   Blood Glucose Monitoring Suppl (ACCU-CHEK GUIDE) w/Device KIT No dose, route, or frequency recorded.   Blood Pressure Monitor KIT Use to check blood pressure as instructed   calcium carbonate (OS-CAL) 1250 (500 Ca) MG chewable tablet 1 tablet, Oral, Daily   cetirizine (ZYRTEC) 10 MG tablet Take 1 tablet by mouth once daily   cholecalciferol (VITAMIN D3)  1,000 Units, Oral, Daily   Continuous Glucose Sensor (DEXCOM G6 SENSOR) MISC Change sensor every 10 days as directed   Continuous Glucose Transmitter (DEXCOM G6 TRANSMITTER) MISC Change transmitter every 90 days as directed.   fluticasone (FLONASE) 50 MCG/ACT nasal spray 2 sprays, Each Nare, Daily   Insulin Disposable Pump (OMNIPOD 5 G6 INTRO, GEN 5,) KIT Change pod every 48-72 hours   Insulin Disposable Pump (OMNIPOD 5 G6 PODS, GEN 5,) MISC Change pod every 48-72 hours   insulin glargine (LANTUS) 35 Units, Daily at bedtime   insulin lispro (HUMALOG) 100 UNIT/ML injection Use with Omnipod for TDD around 70 units daily   labetalol (NORMODYNE) 100 mg, Oral, 2 times daily   Multiple Vitamin (MULTIVITAMIN) tablet 1 tablet, Daily   NIFEdipine (PROCARDIA-XL/NIFEDICAL-XL) 30 mg, Oral, Daily   Prenatal Vit-Fe Fumarate-FA (PRENATAL VITAMIN PO) Oral     Review of Systems:   Pertinent items are noted in HPI Denies abnormal vaginal discharge w/ itching/odor/irritation, headaches, visual changes, shortness of breath, chest pain, abdominal pain, severe nausea/vomiting, or problems with urination or bowel movements unless otherwise stated above. Pertinent History Reviewed:  Reviewed past medical,surgical, social, obstetrical and family history.  Reviewed problem list, medications and allergies.  Physical Assessment:   Vitals:   03/22/23 0908  Weight: 186 lb 6.4 oz (84.6 kg)  Body mass index is 33.02 kg/m.           Physical Examination:   General appearance: alert, well appearing, and in no distress  Mental status: normal mood, behavior, speech, dress, motor activity, and thought processes  Skin: warm & dry   Extremities:   no edema, no calf tenderness bilaterally   Cardiovascular: normal heart rate noted  Respiratory: normal respiratory effort, no distress  Abdomen: gravid, soft, non-tender  Pelvic: Cervical exam deferred         Fetal Status: Fetal Heart Rate (bpm): 150   Movement: Present     Fetal Surveillance Testing today: doppler   Chaperone: N/A    No results found for this or any previous visit (from the past 24 hour(s)).   Assessment & Plan:  High-risk pregnancy: G2P0010 at [redacted]w[redacted]d with an Estimated Date of Delivery: 09/04/23   1) Type 1 DM -suspect that insulin basal needs increased -strongly encouraged pt to bring omnipod info with her to review at next visit -continue to follow with endocrine.  Reviewed that typically during pregnancy tighter control would be ideal -detailed anatomy scan with MFM and will likely also scheduled ECHO at that time -plan for serial growth every 4wk and antepartum testing @ 32wks  2) Chronic HTN -continue current meds  3) h/o multiple myeloma -followed by oncology, next visit 1/14 and 1/21  -uterine fibroids  Meds: No orders of the defined types were placed in this encounter.   Labs/procedures today: IT2  Treatment Plan:  as outlined above and routine OB care  Reviewed: Preterm labor symptoms and general obstetric precautions including but not limited to vaginal bleeding, contractions, leaking of fluid and fetal movement were reviewed in detail with the patient.  All questions were answered. PT has home bp cuff. Check bp weekly, let us know if >140/90.   Follow-up: Return for 2-3wks HROB (MD only).   Future Appointments  Date Time Provider Department Center  04/05/2023  9:10 AM Lazaro Arms, MD [facility]  04/12/2023  7:15 AM [facility] NURSE [facility]  04/12/2023  7:30 AM [facility] US1 [facility]  04/17/2023 10:50 AM Myna Hidalgo, DO [facility]  05/30/2023 10:45 AM [facility] LAB [facility] None  06/06/2023  2:45 PM Doreatha Massed, MD [facility] None  06/16/2023  9:30 AM Dani Gobble, NP [facility] None    Orders Placed This Encounter  Procedures   INTEGRATED 2    Myna Hidalgo, DO Attending Obstetrician & Gynecologist, [facility] for [employer], [facility]

## 2023-03-24 LAB — INTEGRATED 2
AFP MoM: 1.61
Alpha-Fetoprotein: 36.2 ng/mL
Crown Rump Length: 64 mm
DIA MoM: 2.75
DIA Value: 404.9 pg/mL
Estriol, Unconjugated: 0.45 ng/mL
Gest. Age on Collection Date: 12.6 wk
Gestational Age: 15.7 wk
Maternal Age at EDD: 32.6 a
Nuchal Translucency (NT): 1.5 mm
Nuchal Translucency MoM: 1.09
Number of Fetuses: 1
PAPP-A MoM: 0.55
PAPP-A Value: 434.6 ng/mL
Sonographer ID#: 309760
Test Results:: NEGATIVE
Weight: 187 [lb_av]
Weight: 187 [lb_av]
hCG MoM: 2.77
hCG Value: 95.5 [IU]/mL
uE3 MoM: 0.62

## 2023-03-27 ENCOUNTER — Telehealth: Payer: Self-pay

## 2023-03-27 NOTE — Telephone Encounter (Signed)
Received notification from pt's pharmacy pt's Omnipod 5 pods 5pk needs prior authorization.

## 2023-03-28 ENCOUNTER — Other Ambulatory Visit (HOSPITAL_COMMUNITY): Payer: Self-pay

## 2023-03-28 NOTE — Telephone Encounter (Signed)
Pharmacy Patient Advocate Encounter   Received notification from Pt Calls Messages that prior authorization for Omnipod is required/requested.   Insurance verification completed.   The patient is insured through [facility] [place] [place] .   Per test claim: The current 30 day co-pay is, $0.  No PA needed at this time. This test claim was processed through [facility]- copay amounts may vary at other pharmacies due to pharmacy/plan contracts, or as the patient moves through the different stages of their insurance plan.

## 2023-03-28 NOTE — Telephone Encounter (Signed)
Noted  

## 2023-04-05 ENCOUNTER — Encounter: Payer: Self-pay | Admitting: Obstetrics & Gynecology

## 2023-04-05 ENCOUNTER — Ambulatory Visit (INDEPENDENT_AMBULATORY_CARE_PROVIDER_SITE_OTHER): Payer: Medicaid Other | Admitting: Obstetrics & Gynecology

## 2023-04-05 ENCOUNTER — Encounter: Payer: Self-pay | Admitting: Nurse Practitioner

## 2023-04-05 VITALS — BP 126/91 | HR 99 | Wt 187.0 lb

## 2023-04-05 DIAGNOSIS — Z3A18 18 weeks gestation of pregnancy: Secondary | ICD-10-CM

## 2023-04-05 DIAGNOSIS — O0992 Supervision of high risk pregnancy, unspecified, second trimester: Secondary | ICD-10-CM

## 2023-04-05 DIAGNOSIS — O24319 Unspecified pre-existing diabetes mellitus in pregnancy, unspecified trimester: Secondary | ICD-10-CM

## 2023-04-05 DIAGNOSIS — O10919 Unspecified pre-existing hypertension complicating pregnancy, unspecified trimester: Secondary | ICD-10-CM

## 2023-04-05 LAB — POCT URINALYSIS DIPSTICK OB
Blood, UA: NEGATIVE
Ketones, UA: NEGATIVE
Nitrite, UA: NEGATIVE
POC,PROTEIN,UA: NEGATIVE

## 2023-04-05 NOTE — Addendum Note (Signed)
Addended by: Freddie Apley R on: 04/05/2023 11:17 AM   Modules accepted: Orders

## 2023-04-05 NOTE — Progress Notes (Signed)
HIGH-RISK PREGNANCY VISIT Patient name: Diamond Robbins MRN [id number]  Date of birth: February 03, 1991 Chief Complaint:   Routine Prenatal Visit  History of Present Illness:   Diamond Robbins is a 32 y.o. G44P0010 female at [redacted]w[redacted]d with an Estimated Date of Delivery: 09/04/23 being seen today for ongoing management of a high-risk pregnancy complicated by Class B DM(type 1) age 52 on pump + omnipod, most recent A1c 8.2 with suboptimal  Basal 1 u every hour x 24>1.1 Carb sensitivity 1:9>1:8.   Adjustment/SS: unclear to me, she is going to find out  Today she reports no complaints. Contractions: Not present. Vag. Bleeding: None.  Movement: Present. denies leaking of fluid.      02/28/2023    9:39 AM 12/30/2022    9:23 AM 06/30/2022   11:24 AM 09/09/2021   10:53 AM 07/22/2021    9:44 AM  Depression screen PHQ 2/9  Decreased Interest 0 0 0 0 0  Down, Depressed, Hopeless 0 0 0 0 0  PHQ - 2 Score 0 0 0 0 0  Altered sleeping 0 0 0    Tired, decreased energy 0 0 0    Change in appetite 0 0 0    Feeling bad or failure about yourself  0 0 0    Trouble concentrating 0 0 0    Moving slowly or fidgety/restless 0 0 0    Suicidal thoughts 0 0 0    PHQ-9 Score 0 0 0    Difficult doing work/chores  Not difficult at all Not difficult at all          02/28/2023    9:39 AM 12/30/2022    9:22 AM 06/30/2022   11:24 AM  GAD 7 : Generalized Anxiety Score  Nervous, Anxious, on Edge 0 0 0  Control/stop worrying 0 0 0  Worry too much - different things 0 0 0  Trouble relaxing 0 0 0  Restless 0 0 0  Easily annoyed or irritable 0 0 0  Afraid - awful might happen 0 0 0  Total GAD 7 Score 0 0 0  Anxiety Difficulty  Not difficult at all Not difficult at all     Review of Systems:   Pertinent items are noted in HPI Denies abnormal vaginal discharge w/ itching/odor/irritation, headaches, visual changes, shortness of breath, chest pain, abdominal pain, severe nausea/vomiting, or problems with urination or  bowel movements unless otherwise stated above. Pertinent History Reviewed:  Reviewed past medical,surgical, social, obstetrical and family history.  Reviewed problem list, medications and allergies. Physical Assessment:   Vitals:   04/05/23 0913 04/05/23 0916  BP: (!) 129/91 (!) 126/91  Pulse: 100 99  Weight: 187 lb (84.8 kg)   Body mass index is 33.13 kg/m.           Physical Examination:   General appearance: alert, well appearing, and in no distress  Mental status: alert, oriented to person, place, and time  Skin: warm & dry   Extremities: Edema: None    Cardiovascular: normal heart rate noted  Respiratory: normal respiratory effort, no distress  Abdomen: gravid, soft, non-tender  Pelvic: Cervical exam deferred         Fetal Status:     Movement: Present    Fetal Surveillance Testing today: sonogram next week   Chaperone:     No results found for this or any previous visit (from the past 24 hour(s)).  Assessment & Plan:  High-risk pregnancy: G2P0010 at [redacted]w[redacted]d with an Estimated  Date of Delivery: 09/04/23      ICD-10-CM   1. Supervision of high risk pregnancy in second trimester  O09.92     2. Class B DM(type 1) with pump + Omnipod: last A1C 8.2 suboptimal control  O24.319    Basal 1 un/hr x 24>> 1.1 unit Carb sensitivity 1:9..1:8 Adjustment SS: unclear to me, pt is going to find out    3. Chronic hypertension: labeatlol 100 mg BID good control  O10.919         Meds: No orders of the defined types were placed in this encounter.   Orders: No orders of the defined types were placed in this encounter.    Labs/procedures today: none  Treatment Plan:  sonogram next week    Follow-up: Return for keep scheduled.   Future Appointments  Date Time Provider Department Center  04/12/2023  7:15 AM [facility] NURSE [facility]  04/12/2023  7:30 AM [facility] US1 [facility]  04/12/2023  9:00 AM [facility] PROVIDER 1 [facility]  04/17/2023 10:50 AM Myna Hidalgo, DO  [facility]  05/30/2023 10:45 AM [facility] LAB [facility] None  06/06/2023  2:45 PM Doreatha Massed, MD [facility] None  06/16/2023  9:30 AM Dani Gobble, NP [facility] None    No orders of the defined types were placed in this encounter.  Lazaro Arms  Attending Physician for the Center for [facility] 04/05/2023 9:58 AM

## 2023-04-12 ENCOUNTER — Ambulatory Visit: Payer: Medicaid Other | Admitting: Maternal & Fetal Medicine

## 2023-04-12 ENCOUNTER — Encounter: Payer: Medicaid Other | Admitting: Obstetrics and Gynecology

## 2023-04-12 ENCOUNTER — Other Ambulatory Visit: Payer: Medicaid Other

## 2023-04-12 ENCOUNTER — Ambulatory Visit: Payer: Medicaid Other | Attending: Obstetrics & Gynecology

## 2023-04-12 ENCOUNTER — Encounter: Payer: Self-pay | Admitting: *Deleted

## 2023-04-12 ENCOUNTER — Other Ambulatory Visit: Payer: Self-pay | Admitting: *Deleted

## 2023-04-12 ENCOUNTER — Ambulatory Visit: Payer: Medicaid Other | Admitting: *Deleted

## 2023-04-12 VITALS — BP 136/99 | HR 93

## 2023-04-12 DIAGNOSIS — O0992 Supervision of high risk pregnancy, unspecified, second trimester: Secondary | ICD-10-CM

## 2023-04-12 DIAGNOSIS — C9001 Multiple myeloma in remission: Secondary | ICD-10-CM | POA: Insufficient documentation

## 2023-04-12 DIAGNOSIS — O10919 Unspecified pre-existing hypertension complicating pregnancy, unspecified trimester: Secondary | ICD-10-CM | POA: Diagnosis not present

## 2023-04-12 DIAGNOSIS — I152 Hypertension secondary to endocrine disorders: Secondary | ICD-10-CM | POA: Diagnosis not present

## 2023-04-12 DIAGNOSIS — O24012 Pre-existing diabetes mellitus, type 1, in pregnancy, second trimester: Secondary | ICD-10-CM

## 2023-04-12 DIAGNOSIS — O3412 Maternal care for benign tumor of corpus uteri, second trimester: Secondary | ICD-10-CM

## 2023-04-12 DIAGNOSIS — O9A112 Malignant neoplasm complicating pregnancy, second trimester: Secondary | ICD-10-CM

## 2023-04-12 DIAGNOSIS — Z3A19 19 weeks gestation of pregnancy: Secondary | ICD-10-CM

## 2023-04-12 DIAGNOSIS — O10012 Pre-existing essential hypertension complicating pregnancy, second trimester: Secondary | ICD-10-CM | POA: Diagnosis not present

## 2023-04-12 DIAGNOSIS — D259 Leiomyoma of uterus, unspecified: Secondary | ICD-10-CM

## 2023-04-12 DIAGNOSIS — E1059 Type 1 diabetes mellitus with other circulatory complications: Secondary | ICD-10-CM | POA: Insufficient documentation

## 2023-04-12 DIAGNOSIS — O365990 Maternal care for other known or suspected poor fetal growth, unspecified trimester, not applicable or unspecified: Secondary | ICD-10-CM

## 2023-04-12 DIAGNOSIS — O10912 Unspecified pre-existing hypertension complicating pregnancy, second trimester: Secondary | ICD-10-CM

## 2023-04-12 DIAGNOSIS — O0991 Supervision of high risk pregnancy, unspecified, first trimester: Secondary | ICD-10-CM | POA: Insufficient documentation

## 2023-04-12 NOTE — Progress Notes (Signed)
Patient information  Patient Name: Diamond Robbins  Patient MRN:   [id number]  Referring practice: MFM Referring Provider: [facility]  MFM CONSULT  Diamond Robbins is a 32 y.o. G2P0010 at [redacted]w[redacted]d here for ultrasound and consultation.   RE multiple myeloma: The patient has a history of multiple myeloma which is a lifelong cancer of the bone.  She was diagnosed in 2021 and has been compliant with therapy.  She is status post bone marrow transplant and was previously on Revlimid and Zometa until she achieved a positive pregnancy test.  Unfortunately all treatment for multiple myeloma in pregnancy is contraindicated except for dexamethasone.  She will continue to hold her Revlimid and Zometa throughout her pregnancy.  She understands that there is a slight increased risk of recurrence if she is off her therapy but she is accepting this risk at the cost of achieving pregnancy.  We discussed is important to monitor for signs and symptoms of recurrence which are often similar to that of a normal pregnancy.  However, bone pain is the most common symptom of multiple myeloma and is not common or expected to be part of a normal pregnancy course.  She knows to follow-up with her hematologist closely.  Multiple myeloma can often affect the kidneys, bone marrow and calcium levels.  The patient's most recent kidney function that I can view was normal with a creatinine of 0.83.  Her hemoglobin was also normal at 13.4.  Various laboratory parameters are expected to be abnormal during pregnancy.  Often the total protein levels are increased but the free protein levels are decreased.  Specific pregnancy reference ranges should be used for any labs ordered during pregnancy.  Typical signs of recurrence during pregnancy include bone pain, new bone lytic lesions, renal failure, hypercalcemia, elevated serum free light chains, plasmacytosis of at least 20%.  Today she is doing well without any concerning signs or symptoms such  as bone pain or extreme fatigue.  RE type 1 diabetes: I discussed the potential complications as well as the management of type 1 diabetes in pregnancy.  We discussed the importance of proper glycemic control to minimize the risk of adverse outcomes including but not limited to fetal growth restriction, congenital anomalies, preterm birth, worsening vascular disease from diabetes and cesarean delivery.  The patient reports that her blood sugars have been reasonably well-controlled on Lantus and NovoLog since achieving pregnancy.  She will continue to monitor her blood sugars throughout her pregnancy with a goal of a fasting blood sugar of less than 95 and a 1 hour blood sugar of less than 140.  We discussed the importance of serial growth ultrasounds and a fetal echo to rule out congenital heart disease.  We also discussed the timing of delivery will depend on her prenatal course but likely around 37 weeks.  RE chronic hypertension: Chronic hypertension is usually well-tolerated during pregnancy but increases the risk of preeclampsia.  She is compliant with aspirin for preeclampsia risk reduction.  We discussed the timing of delivery will likely be around 37 weeks but may be earlier if preeclampsia is superimposed.  The plan of care for pregnancy is similar to that of type 1 diabetes in the ultrasound schedule will be listed below.  She will monitor for signs and symptoms of preeclampsia throughout her pregnancy and will continue to take labetalol and nifedipine with a blood pressure goal of less than 140/90.  Sonographic findings Single intrauterine pregnancy at 19w 2d. Fetal cardiac activity:  Observed and  appears normal. Presentation: Breech. The anatomic structures that were well seen appear normal without evidence of soft markers. The anatomic survey is complete.  Fetal biometry shows the estimated fetal weight at the 2.4 percentile. Amniotic fluid: Within normal limits.  MVP:  cm. Placenta:  Posterior. Adnexa: No abnormality visualized. Cervical length: 3.6 cm.  Assessment Multiple myeloma in remission (HCC)  Type 1 diabetes mellitus in pregnancy, second trimester  Supervision of high risk pregnancy in second trimester  Chronic hypertension affecting pregnancy  Recommendations -EDD should be 09/04/2023 based on early ultrasound  (01/24/23). -Detailed ultrasound was done today without abnormalities  -Baseline preeclampsia labs: CMP, CBC and urine protein/creatinine ratio if not previously completed.  -Continue diabetic care with goal of fasting blood glucose < 95 and 1 hour blood glucose of less than 140 or 2-hour blood glucose less than 120 at least 50% of the time. -Continue Aspirin 81 mg for preeclampsia prophylasis -Continue labetalol and nifedipine with a blood pressure goal of less than 140/90 -Follow-up anatomy and fetal growth in 4 to 6 weeks -Serial growth ultrasounds starting around 28 weeks to monitor for fetal growth restriction -Fetal echo ordered -Antenatal testing to start around 30-32 weeks due to the increased risk of stillbirth and high risk pregnancy -Delivery timing pending clinical course but likely around [redacted] weeks gestation or sooner if indicated -Continue close follow-up with hematology oncologist.  Consider labs for multiple myeloma at least once every trimester or sooner if there is concern for recurrence to monitor for recurrence (at least obtain CBC, CMP, serum free light chains, plasma cell assessment and calcium levels) every trimester.  Note some reference ranges need to be adjusted for pregnancy.  -Continue routine prenatal care with referring OB provider  Review of Systems: A review of systems was performed and was negative except per HPI   Vitals and Physical Exam    04/12/2023    8:26 AM 04/12/2023    7:30 AM 04/05/2023    9:16 AM  Vitals with BMI  Systolic 136 142 621  Diastolic 99 100 91  Pulse  93 99  Sitting comfortably on the  sonogram table Nonlabored breathing Normal rate and rhythm Abdomen is nontender  Past pregnancies OB History  Gravida Para Term Preterm AB Living  2 0 0 0 1 0  SAB IAB Ectopic Multiple Live Births  1 0 0 0 0    # Outcome Date GA Lbr Len/2nd Weight Sex Type Anes PTL Lv  2 Current           1 SAB  [redacted]w[redacted]d            I spent 65 minutes reviewing the patients chart, including labs and images as well as counseling the patient about her medical conditions. Greater than 50% of the time was spent in direct face-to-face patient counseling.  Braxton Feathers  MFM, [facility]   04/12/2023  4:31 PM

## 2023-04-16 DIAGNOSIS — Z419 Encounter for procedure for purposes other than remedying health state, unspecified: Secondary | ICD-10-CM | POA: Diagnosis not present

## 2023-04-17 ENCOUNTER — Other Ambulatory Visit: Payer: Medicaid Other

## 2023-04-17 ENCOUNTER — Ambulatory Visit (INDEPENDENT_AMBULATORY_CARE_PROVIDER_SITE_OTHER): Payer: Medicaid Other | Admitting: Obstetrics & Gynecology

## 2023-04-17 ENCOUNTER — Encounter: Payer: Medicaid Other | Admitting: Obstetrics & Gynecology

## 2023-04-17 ENCOUNTER — Encounter: Payer: Self-pay | Admitting: Obstetrics & Gynecology

## 2023-04-17 VITALS — BP 138/96 | HR 104 | Wt 190.4 lb

## 2023-04-17 DIAGNOSIS — O10919 Unspecified pre-existing hypertension complicating pregnancy, unspecified trimester: Secondary | ICD-10-CM

## 2023-04-17 DIAGNOSIS — O0992 Supervision of high risk pregnancy, unspecified, second trimester: Secondary | ICD-10-CM

## 2023-04-17 DIAGNOSIS — O24319 Unspecified pre-existing diabetes mellitus in pregnancy, unspecified trimester: Secondary | ICD-10-CM

## 2023-04-17 DIAGNOSIS — Z3A20 20 weeks gestation of pregnancy: Secondary | ICD-10-CM

## 2023-04-17 DIAGNOSIS — C9001 Multiple myeloma in remission: Secondary | ICD-10-CM

## 2023-04-17 DIAGNOSIS — E1065 Type 1 diabetes mellitus with hyperglycemia: Secondary | ICD-10-CM

## 2023-04-17 MED ORDER — "INSULIN SYRINGE 31G X 5/16"" 0.3 ML MISC": 100.0000 | Freq: Every day | 3 refills | Status: DC

## 2023-04-17 MED ORDER — INSULIN NPH (HUMAN) (ISOPHANE) 100 UNIT/ML ~~LOC~~ SUSP
8.0000 [IU] | Freq: Every day | SUBCUTANEOUS | 3 refills | Status: DC
Start: 2023-04-17 — End: 2023-04-19

## 2023-04-17 NOTE — Progress Notes (Signed)
HIGH-RISK PREGNANCY VISIT Patient name: Diamond Robbins MRN [id number]  Date of birth: 08/31/1990 Chief Complaint:   Routine Prenatal Visit  History of Present Illness:   Diamond Robbins is a 32 y.o. G55P0010 female at [redacted]w[redacted]d with an Estimated Date of Delivery: 09/04/23 being seen today for ongoing management of a high-risk pregnancy complicated by:   Class B DM(type 1) with pump + Omnipod Followed by endocrinology Currently basal- 1u every hour Carb ratio: 1:8 Sugars reviewed- elevated fastings noted  Chronic hypertension: labeatlol 100 mg BID and Procardia XL30mg  daily Home BPs 120/90s  Multiple myeloma in remission [facility]) Next visit with Heme in January Seen by MFM, recommendation for lab work q trimester  Today she reports no complaints.  Of note this a.m. she took her blood pressure medicine on an empty stomach and vomited.  Normally takes her medication with food  Contractions: Not present. Vag. Bleeding: None.  Movement: Present. denies leaking of fluid.      02/28/2023    9:39 AM 12/30/2022    9:23 AM 06/30/2022   11:24 AM 09/09/2021   10:53 AM 07/22/2021    9:44 AM  Depression screen PHQ 2/9  Decreased Interest 0 0 0 0 0  Down, Depressed, Hopeless 0 0 0 0 0  PHQ - 2 Score 0 0 0 0 0  Altered sleeping 0 0 0    Tired, decreased energy 0 0 0    Change in appetite 0 0 0    Feeling bad or failure about yourself  0 0 0    Trouble concentrating 0 0 0    Moving slowly or fidgety/restless 0 0 0    Suicidal thoughts 0 0 0    PHQ-9 Score 0 0 0    Difficult doing work/chores  Not difficult at all Not difficult at all       Current Outpatient Medications  Medication Instructions   ACCU-CHEK GUIDE test strip Use to check blood glucose 4 times daily.   Accu-Chek Softclix Lancets lancets Use to check blood glucose 4 times daily.   aspirin EC 162 mg, Oral, Daily, Swallow whole.   Blood Glucose Monitoring Suppl (ACCU-CHEK GUIDE) w/Device KIT No dose, route, or frequency recorded.    Blood Pressure Monitor KIT Use to check blood pressure as instructed   calcium carbonate (OS-CAL) 1250 (500 Ca) MG chewable tablet 1 tablet, Oral, Daily   cetirizine (ZYRTEC) 10 MG tablet Take 1 tablet by mouth once daily   cholecalciferol (VITAMIN D3) 1,000 Units, Oral, Daily   Continuous Glucose Sensor (DEXCOM G6 SENSOR) MISC Change sensor every 10 days as directed   Continuous Glucose Transmitter (DEXCOM G6 TRANSMITTER) MISC Change transmitter every 90 days as directed.   fluticasone (FLONASE) 50 MCG/ACT nasal spray 2 sprays, Each Nare, Daily   Insulin Disposable Pump (OMNIPOD 5 G6 INTRO, GEN 5,) KIT Change pod every 48-72 hours   Insulin Disposable Pump (OMNIPOD 5 G6 PODS, GEN 5,) MISC Change pod every 48-72 hours   insulin lispro (HUMALOG) 100 UNIT/ML injection Use with Omnipod for TDD around 70 units daily   insulin NPH Human (NOVOLIN N) 8 Units, Subcutaneous, Daily at bedtime   Insulin Syringe-Needle U-100 (INSULIN SYRINGE .3CC/31GX5/16") 31G X 5/16" 0.3 ML MISC 100 each, Does not apply, Daily   labetalol (NORMODYNE) 100 mg, Oral, 2 times daily   NIFEdipine (PROCARDIA-XL/NIFEDICAL-XL) 30 mg, Oral, Daily   Prenatal Vit-Fe Fumarate-FA (PRENATAL VITAMIN PO) Oral     Review of Systems:   Pertinent items are  noted in HPI Denies abnormal vaginal discharge w/ itching/odor/irritation, headaches, visual changes, shortness of breath, chest pain, abdominal pain, severe nausea/vomiting, or problems with urination or bowel movements unless otherwise stated above. Pertinent History Reviewed:  Reviewed past medical,surgical, social, obstetrical and family history.  Reviewed problem list, medications and allergies. Physical Assessment:   Vitals:   04/17/23 1008 04/17/23 1028  BP: (!) 150/103 (!) 138/96  Pulse: (!) 101 (!) 104  Weight: 190 lb 6.4 oz (86.4 kg)   Body mass index is 33.73 kg/m.           Physical Examination:   General appearance: alert, well appearing, and in no  distress  Mental status: normal mood, behavior, speech, dress, motor activity, and thought processes  Skin: warm & dry   Extremities:   no edema   Cardiovascular: normal heart rate noted  Respiratory: normal respiratory effort, no distress  Abdomen: gravid, soft, non-tender  Pelvic: Cervical exam deferred         Fetal Status: Fetal Heart Rate (bpm): 150   Movement: Present    Fetal Surveillance Testing today: doppler   Chaperone: N/A    No results found for this or any previous visit (from the past 24 hour(s)).   Assessment & Plan:  High-risk pregnancy: G2P0010 at [redacted]w[redacted]d with an Estimated Date of Delivery: 09/04/23   1) Type 1 DM (class B)  -due to fasting sugars will add NPH 8units to increase for fastings at least <100 -follow up in 2wks -continue with current carb ratio and basal rate   2) Chronic HTN Continue current meds Continue home checks  3) Multiple myeloma -followed by hem -s/p MFM consult/recommendation see note []  plan for lab work with 2nd trimester labs    Meds:  Meds ordered this encounter  Medications   insulin NPH Human (NOVOLIN N) 100 UNIT/ML injection    Sig: Inject 0.08 mLs (8 Units total) into the skin at bedtime.    Dispense:  10 mL    Refill:  3   Insulin Syringe-Needle U-100 (INSULIN SYRINGE .3CC/31GX5/16") 31G X 5/16" 0.3 ML MISC    Sig: 100 each by Does not apply route daily.    Dispense:  100 each    Refill:  3    Labs/procedures today: doppler  Treatment Plan:  -plan for serial growth scans -antepartum testing @ 32 weeks -delivery ~ 37wks  Reviewed: Preterm labor symptoms and general obstetric precautions including but not limited to vaginal bleeding, contractions, leaking of fluid and fetal movement were reviewed in detail with the patient.  All questions were answered. Pt has home bp cuff. Check bp weekly, let us know if >140/90.   Follow-up: Return in about 2 weeks (around 05/01/2023) for HROB visit and growth every 4 weeks  (starting at 28wk-14mos from now).   Future Appointments  Date Time Provider Department Center  05/01/2023  9:30 AM Myna Hidalgo, DO [facility]  05/16/2023  1:15 PM [facility] NURSE [facility]  05/16/2023  1:30 PM [facility] US4 [facility]  05/30/2023 10:45 AM [facility]AC[facility] LAB [facility] None  06/06/2023  2:45 PM Doreatha Massed, MD [facility] None  06/16/2023  9:30 AM Dani Gobble, NP REA-REA None    No orders of the defined types were placed in this encounter.   Myna Hidalgo, DO Attending Obstetrician & Gynecologist, [facility] for [employer], [facility]

## 2023-04-19 ENCOUNTER — Other Ambulatory Visit: Payer: Self-pay | Admitting: *Deleted

## 2023-04-19 ENCOUNTER — Encounter: Payer: Self-pay | Admitting: Obstetrics & Gynecology

## 2023-04-19 DIAGNOSIS — O24319 Unspecified pre-existing diabetes mellitus in pregnancy, unspecified trimester: Secondary | ICD-10-CM

## 2023-04-19 MED ORDER — INSULIN NPH (HUMAN) (ISOPHANE) 100 UNIT/ML ~~LOC~~ SUSP: 8.0000 [IU] | Freq: Every day | SUBCUTANEOUS | 3 refills | Status: DC

## 2023-05-01 ENCOUNTER — Encounter: Payer: Self-pay | Admitting: Obstetrics & Gynecology

## 2023-05-01 ENCOUNTER — Ambulatory Visit (INDEPENDENT_AMBULATORY_CARE_PROVIDER_SITE_OTHER): Payer: Medicaid Other | Admitting: Obstetrics & Gynecology

## 2023-05-01 ENCOUNTER — Ambulatory Visit: Payer: Medicaid Other

## 2023-05-01 VITALS — BP 132/88 | HR 99 | Wt 190.2 lb

## 2023-05-01 DIAGNOSIS — Z3A22 22 weeks gestation of pregnancy: Secondary | ICD-10-CM

## 2023-05-01 DIAGNOSIS — E1065 Type 1 diabetes mellitus with hyperglycemia: Secondary | ICD-10-CM | POA: Diagnosis not present

## 2023-05-01 DIAGNOSIS — O24012 Pre-existing diabetes mellitus, type 1, in pregnancy, second trimester: Secondary | ICD-10-CM | POA: Diagnosis not present

## 2023-05-01 DIAGNOSIS — O10919 Unspecified pre-existing hypertension complicating pregnancy, unspecified trimester: Secondary | ICD-10-CM

## 2023-05-01 DIAGNOSIS — O10012 Pre-existing essential hypertension complicating pregnancy, second trimester: Secondary | ICD-10-CM

## 2023-05-01 DIAGNOSIS — C9001 Multiple myeloma in remission: Secondary | ICD-10-CM | POA: Diagnosis not present

## 2023-05-01 DIAGNOSIS — O0992 Supervision of high risk pregnancy, unspecified, second trimester: Secondary | ICD-10-CM

## 2023-05-01 DIAGNOSIS — Z23 Encounter for immunization: Secondary | ICD-10-CM | POA: Diagnosis not present

## 2023-05-01 DIAGNOSIS — O9A112 Malignant neoplasm complicating pregnancy, second trimester: Secondary | ICD-10-CM

## 2023-05-01 LAB — POCT URINALYSIS DIPSTICK OB
Ketones, UA: NEGATIVE
Leukocytes, UA: NEGATIVE
Nitrite, UA: NEGATIVE

## 2023-05-01 NOTE — Progress Notes (Signed)
HIGH-RISK PREGNANCY VISIT Patient name: Diamond Robbins MRN [id number]  Date of birth: December 07, 1990 Chief Complaint:   Routine Prenatal Visit  History of Present Illness:   Diamond Robbins is a 32 y.o. G78P0010 female at [redacted]w[redacted]d with an Estimated Date of Delivery: 09/04/23 being seen today for ongoing management of a high-risk pregnancy complicated by:  1) Class B DM Has Dexcom/Omnipod, seen by endocrinology, but we are also adjusting for better control Currently:  1.1u q hr x 24h Carb sensitivity1:8 8 units NPH (added last visit)  2) Chronic HTN On procardai XL 30mg  dialy and Labetalol 100mg  bid  3) Multiple myeloma- in remission   Today she reports no complaints.   Contractions: Not present. Vag. Bleeding: None.  Movement: Present. denies leaking of fluid.      02/28/2023    9:39 AM 12/30/2022    9:23 AM 06/30/2022   11:24 AM 09/09/2021   10:53 AM 07/22/2021    9:44 AM  Depression screen PHQ 2/9  Decreased Interest 0 0 0 0 0  Down, Depressed, Hopeless 0 0 0 0 0  PHQ - 2 Score 0 0 0 0 0  Altered sleeping 0 0 0    Tired, decreased energy 0 0 0    Change in appetite 0 0 0    Feeling bad or failure about yourself  0 0 0    Trouble concentrating 0 0 0    Moving slowly or fidgety/restless 0 0 0    Suicidal thoughts 0 0 0    PHQ-9 Score 0 0 0    Difficult doing work/chores  Not difficult at all Not difficult at all       Current Outpatient Medications  Medication Instructions   ACCU-CHEK GUIDE test strip Use to check blood glucose 4 times daily.   Accu-Chek Softclix Lancets lancets Use to check blood glucose 4 times daily.   aspirin EC 162 mg, Oral, Daily, Swallow whole.   Blood Glucose Monitoring Suppl (ACCU-CHEK GUIDE) w/Device KIT No dose, route, or frequency recorded.   Blood Pressure Monitor KIT Use to check blood pressure as instructed   calcium carbonate (OS-CAL) 1250 (500 Ca) MG chewable tablet 1 tablet, Daily   cetirizine (ZYRTEC) 10 MG tablet Take 1 tablet by mouth  once daily   cholecalciferol (VITAMIN D3) 1,000 Units, Daily   Continuous Glucose Sensor (DEXCOM G6 SENSOR) MISC Change sensor every 10 days as directed   Continuous Glucose Transmitter (DEXCOM G6 TRANSMITTER) MISC Change transmitter every 90 days as directed.   fluticasone (FLONASE) 50 MCG/ACT nasal spray 2 sprays, Each Nare, Daily   Insulin Disposable Pump (OMNIPOD 5 G6 INTRO, GEN 5,) KIT Change pod every 48-72 hours   Insulin Disposable Pump (OMNIPOD 5 G6 PODS, GEN 5,) MISC Change pod every 48-72 hours   insulin lispro (HUMALOG) 100 UNIT/ML injection Use with Omnipod for TDD around 70 units daily   insulin NPH Human (NOVOLIN N) 8 Units, Subcutaneous, Daily at bedtime   Insulin Syringe-Needle U-100 (INSULIN SYRINGE .3CC/31GX5/16") 31G X 5/16" 0.3 ML MISC 100 each, Does not apply, Daily   labetalol (NORMODYNE) 100 mg, Oral, 2 times daily   NIFEdipine (PROCARDIA-XL/NIFEDICAL-XL) 30 mg, Oral, Daily   Prenatal Vit-Fe Fumarate-FA (PRENATAL VITAMIN PO) Take by mouth.     Review of Systems:   Pertinent items are noted in HPI Denies abnormal vaginal discharge w/ itching/odor/irritation, headaches, visual changes, shortness of breath, chest pain, abdominal pain, severe nausea/vomiting, or problems with urination or bowel movements unless otherwise stated  above. Pertinent History Reviewed:  Reviewed past medical,surgical, social, obstetrical and family history.  Reviewed problem list, medications and allergies. Physical Assessment:   Vitals:   05/01/23 0930 05/01/23 0935  BP: (!) 130/90 132/88  Pulse: (!) 103 99  Weight: 190 lb 3.2 oz (86.3 kg)   Body mass index is 33.69 kg/m.           Physical Examination:   General appearance: alert, well appearing, and in no distress  Mental status: normal mood, behavior, speech, dress, motor activity, and thought processes  Skin: warm & dry   Extremities: Edema: None    Cardiovascular: normal heart rate noted  Respiratory: normal respiratory effort,  no distress  Abdomen: gravid, soft, non-tender  Pelvic: Cervical exam deferred         Fetal Status: Fetal Heart Rate (bpm): 130   Movement: Present    Fetal Surveillance Testing today: doppler   Chaperone: N/A    Results for orders placed or performed in visit on 05/01/23 (from the past 24 hours)  POC Urinalysis Dipstick OB   Collection Time: 05/01/23  9:34 AM  Result Value Ref Range   Color, UA     Clarity, UA     Glucose, UA Moderate (2+) (A) Negative   Bilirubin, UA     Ketones, UA Negative    Spec Grav, UA     Blood, UA Trace-intact    pH, UA     POC,PROTEIN,UA Trace Negative, Trace, Small (1+), Moderate (2+), Large (3+), 4+   Urobilinogen, UA     Nitrite, UA Negative    Leukocytes, UA Negative Negative   Appearance     Odor       Assessment & Plan:  High-risk pregnancy: G2P0010 at [redacted]w[redacted]d with an Estimated Date of Delivery: 09/04/23   1) Type 1 DM -1.1u q hr x 24h Increased Carb sensitivity to 1:7 ratio Increased to 10u NPH at night  -growth scan scheduled 12/31 with MFM, ECHO scheduled next week -antepartum testing to start @ 32wks  2) Multiple myeloma -followed by oncology -currently asymptomatic, see MFM recs regard lab work []  plan to complete with 2nd trimester labs around 26-28wks  3) Chronic HTN -stable continue current meds  Meds: No orders of the defined types were placed in this encounter.   Labs/procedures today: doppler  Treatment Plan:  routine OB care and as outlined above  Reviewed: Preterm labor symptoms and general obstetric precautions including but not limited to vaginal bleeding, contractions, leaking of fluid and fetal movement were reviewed in detail with the patient.  All questions were answered. Pt has home bp cuff. Check bp weekly, let us know if >140/90.   Follow-up: Return in about 2 weeks (around 05/15/2023) for HROB visit- MD only .   Future Appointments  Date Time Provider Department Center  05/12/2023 11:00 AM  [facility] EYE EXAM [facility] None  05/16/2023  1:15 PM [facility] NURSE [facility]  05/16/2023  1:30 PM [facility] US4 [facility]  05/18/2023 11:10 AM Lazaro Arms, MD [facility]  05/30/2023 10:45 AM AP-ACAPA LAB [facility] None  06/06/2023  2:45 PM Doreatha Massed, MD [facility] None  06/12/2023  8:30 AM [facility] - FTOBGYN [place] [facility]-FTIMG None  06/12/2023  9:30 AM Lazaro Arms, MD [facility]  06/16/2023  9:30 AM Dani Gobble, NP REA-REA None  07/10/2023  8:30 AM [facility] - FTOBGYN [place] [facility]G None  07/10/2023  9:30 AM Lazaro Arms, MD [facility]  08/07/2023  8:30 AM  [facility] - FT IMG 2 [facility]-FTIMG None    Orders Placed This Encounter  Procedures   Flu vaccine trivalent PF, 6mos and older(Flulaval,Afluria,Fluarix,Fluzone)   POC Urinalysis Dipstick OB    Myna Hidalgo, DO Attending Obstetrician & Gynecologist, [facility] for [employer], [facility]

## 2023-05-04 DIAGNOSIS — O24012 Pre-existing diabetes mellitus, type 1, in pregnancy, second trimester: Secondary | ICD-10-CM | POA: Diagnosis not present

## 2023-05-12 ENCOUNTER — Ambulatory Visit: Payer: Medicaid Other

## 2023-05-16 ENCOUNTER — Ambulatory Visit: Payer: Medicaid Other | Admitting: *Deleted

## 2023-05-16 ENCOUNTER — Other Ambulatory Visit: Payer: Self-pay

## 2023-05-16 ENCOUNTER — Encounter: Payer: Self-pay | Admitting: *Deleted

## 2023-05-16 ENCOUNTER — Other Ambulatory Visit: Payer: Self-pay | Admitting: Maternal & Fetal Medicine

## 2023-05-16 ENCOUNTER — Other Ambulatory Visit: Payer: Self-pay | Admitting: *Deleted

## 2023-05-16 ENCOUNTER — Ambulatory Visit: Payer: Medicaid Other | Attending: Maternal & Fetal Medicine

## 2023-05-16 VITALS — BP 130/95 | HR 92

## 2023-05-16 DIAGNOSIS — O09899 Supervision of other high risk pregnancies, unspecified trimester: Secondary | ICD-10-CM | POA: Diagnosis not present

## 2023-05-16 DIAGNOSIS — O10912 Unspecified pre-existing hypertension complicating pregnancy, second trimester: Secondary | ICD-10-CM | POA: Insufficient documentation

## 2023-05-16 DIAGNOSIS — E669 Obesity, unspecified: Secondary | ICD-10-CM

## 2023-05-16 DIAGNOSIS — O3412 Maternal care for benign tumor of corpus uteri, second trimester: Secondary | ICD-10-CM | POA: Diagnosis not present

## 2023-05-16 DIAGNOSIS — O365930 Maternal care for other known or suspected poor fetal growth, third trimester, not applicable or unspecified: Secondary | ICD-10-CM

## 2023-05-16 DIAGNOSIS — D259 Leiomyoma of uterus, unspecified: Secondary | ICD-10-CM | POA: Diagnosis not present

## 2023-05-16 DIAGNOSIS — E1069 Type 1 diabetes mellitus with other specified complication: Secondary | ICD-10-CM | POA: Diagnosis not present

## 2023-05-16 DIAGNOSIS — O365920 Maternal care for other known or suspected poor fetal growth, second trimester, not applicable or unspecified: Secondary | ICD-10-CM

## 2023-05-16 DIAGNOSIS — Z3A24 24 weeks gestation of pregnancy: Secondary | ICD-10-CM

## 2023-05-16 DIAGNOSIS — O0992 Supervision of high risk pregnancy, unspecified, second trimester: Secondary | ICD-10-CM | POA: Insufficient documentation

## 2023-05-16 DIAGNOSIS — O24012 Pre-existing diabetes mellitus, type 1, in pregnancy, second trimester: Secondary | ICD-10-CM

## 2023-05-16 DIAGNOSIS — O365990 Maternal care for other known or suspected poor fetal growth, unspecified trimester, not applicable or unspecified: Secondary | ICD-10-CM | POA: Insufficient documentation

## 2023-05-16 DIAGNOSIS — O99212 Obesity complicating pregnancy, second trimester: Secondary | ICD-10-CM | POA: Diagnosis not present

## 2023-05-16 DIAGNOSIS — O10012 Pre-existing essential hypertension complicating pregnancy, second trimester: Secondary | ICD-10-CM

## 2023-05-17 DIAGNOSIS — Z419 Encounter for procedure for purposes other than remedying health state, unspecified: Secondary | ICD-10-CM | POA: Diagnosis not present

## 2023-05-17 NOTE — L&D Delivery Note (Signed)
 OB/GYN Faculty Practice Delivery Note  Diamond Robbins is a 33 y.o. G2P0010 s/p SVD at [redacted]w[redacted]d. She was admitted for IOL 2/2 severe IUGR, T1DM, cHTN.   ROM: 14h 15m with clear fluid GBS Status: Negative/-- (03/17 1430) Maximum Maternal Temperature: Temp (24hrs), Avg:97.9 F (36.6 C), Min:97.3 F (36.3 C), Max:98.6 F (37 C)   Labor Progress: Initial SVE: 1.5/thick. AROM, Pitocin, and IP Foley required. She then progressed to complete.   Delivery Date/Time: 08/10/23 2011 Delivery: Called to room and patient was complete and involuntarily pushing. NICU called to attend delivery. Head delivered OA to ROA. No nuchal cord present. Shoulder and body delivered in usual fashion. Infant with spontaneous cry, placed on mother's abdomen, dried and stimulated. Cord clamped x 2 after +1-minute delay, and cut by grandpa. Cord gases not obtained. Cord blood drawn. Placenta delivered spontaneously with gentle cord traction. Fundus firm with massage and Pitocin. Labia, perineum, and vagina inspected with abrasions which  did not require repair . Babe to NICU for weight Baby Weight: pending  Placenta: 3 vessel, intact. Sent to pathology Complications: None Lacerations: Abrasions EBL: 50 mL Anesthesia: epidural  Infant: baby boy APGAR (1 MIN): 9 APGAR (5 MINS): 9  APGAR (10 MINS):    Joanne Gavel, MD [facility] Family Medicine Fellow, [facility] for [facility], [facility] 08/10/2023, 8:23 PM

## 2023-05-18 ENCOUNTER — Encounter: Payer: Self-pay | Admitting: Family Medicine

## 2023-05-18 ENCOUNTER — Ambulatory Visit: Payer: Medicaid Other | Admitting: Obstetrics & Gynecology

## 2023-05-18 VITALS — BP 127/86 | HR 87 | Wt 193.0 lb

## 2023-05-18 DIAGNOSIS — O09899 Supervision of other high risk pregnancies, unspecified trimester: Secondary | ICD-10-CM | POA: Insufficient documentation

## 2023-05-18 DIAGNOSIS — O24312 Unspecified pre-existing diabetes mellitus in pregnancy, second trimester: Secondary | ICD-10-CM

## 2023-05-18 DIAGNOSIS — O24319 Unspecified pre-existing diabetes mellitus in pregnancy, unspecified trimester: Secondary | ICD-10-CM

## 2023-05-18 DIAGNOSIS — O10912 Unspecified pre-existing hypertension complicating pregnancy, second trimester: Secondary | ICD-10-CM

## 2023-05-18 DIAGNOSIS — Z3A24 24 weeks gestation of pregnancy: Secondary | ICD-10-CM

## 2023-05-18 DIAGNOSIS — O10919 Unspecified pre-existing hypertension complicating pregnancy, unspecified trimester: Secondary | ICD-10-CM

## 2023-05-18 DIAGNOSIS — O0992 Supervision of high risk pregnancy, unspecified, second trimester: Secondary | ICD-10-CM

## 2023-05-18 NOTE — Progress Notes (Signed)
 HIGH-RISK PREGNANCY VISIT Patient name: Diamond Robbins MRN [id number]  Date of birth: Feb 15, 1991 Chief Complaint:   Routine Prenatal Visit  History of Present Illness:   Dagny Fiorentino is a 33 y.o. G80P0010 female at [redacted]w[redacted]d with an Estimated Date of Delivery: 09/04/23 being seen today for ongoing management of a high-risk pregnancy complicated by Class B DM(type 1).    Today she reports no complaints. Contractions: Not present. Vag. Bleeding: None.  Movement: Present. denies leaking of fluid.      02/28/2023    9:39 AM 12/30/2022    9:23 AM 06/30/2022   11:24 AM 09/09/2021   10:53 AM 07/22/2021    9:44 AM  Depression screen PHQ 2/9  Decreased Interest 0 0 0 0 0  Down, Depressed, Hopeless 0 0 0 0 0  PHQ - 2 Score 0 0 0 0 0  Altered sleeping 0 0 0    Tired, decreased energy 0 0 0    Change in appetite 0 0 0    Feeling bad or failure about yourself  0 0 0    Trouble concentrating 0 0 0    Moving slowly or fidgety/restless 0 0 0    Suicidal thoughts 0 0 0    PHQ-9 Score 0 0 0    Difficult doing work/chores  Not difficult at all Not difficult at all          02/28/2023    9:39 AM 12/30/2022    9:22 AM 06/30/2022   11:24 AM  GAD 7 : Generalized Anxiety Score  Nervous, Anxious, on Edge 0 0 0  Control/stop worrying 0 0 0  Worry too much - different things 0 0 0  Trouble relaxing 0 0 0  Restless 0 0 0  Easily annoyed or irritable 0 0 0  Afraid - awful might happen 0 0 0  Total GAD 7 Score 0 0 0  Anxiety Difficulty  Not difficult at all Not difficult at all     Review of Systems:   Pertinent items are noted in HPI Denies abnormal vaginal discharge w/ itching/odor/irritation, headaches, visual changes, shortness of breath, chest pain, abdominal pain, severe nausea/vomiting, or problems with urination or bowel movements unless otherwise stated above. Pertinent History Reviewed:  Reviewed past medical,surgical, social, obstetrical and family history.  Reviewed problem list,  medications and allergies. Physical Assessment:   Vitals:   05/18/23 1119  BP: 127/86  Pulse: 87  Weight: 193 lb (87.5 kg)  Body mass index is 34.19 kg/m.           Physical Examination:   General appearance: alert, well appearing, and in no distress  Mental status: alert, oriented to person, place, and time  Skin: warm & dry   Extremities:      Cardiovascular: normal heart rate noted  Respiratory: normal respiratory effort, no distress  Abdomen: gravid, soft, non-tender  Pelvic: Cervical exam deferred         Fetal Status:     Movement: Present    Fetal Surveillance Testing today:    Chaperone: N/A    No results found for this or any previous visit (from the past 24 hours).  Assessment & Plan:  High-risk pregnancy: G2P0010 at [redacted]w[redacted]d with an Estimated Date of Delivery: 09/04/23      ICD-10-CM   1. Supervision of high risk pregnancy in second trimester  O09.92     2. Class B DM(type 1) with pump + Omnipod  O24.319    ^14  untis NPH@bedtime , cont 1:7 carb sensitivity add pre meal correction 1 unit for every 10 >125    3. Chronic hypertension: labetalol  100 BID  O10.919         Meds: No orders of the defined types were placed in this encounter.   Orders: No orders of the defined types were placed in this encounter.    Labs/procedures today: none  Treatment Plan:  keep scheduled    Follow-up: No follow-ups on file.   Future Appointments  Date Time Provider Department Center  05/30/2023 10:45 AM AP-AC[facility]A LAB [facility] None  06/02/2023  1:00 PM [facility] NURSE [facility]  06/02/2023  1:30 PM [facility] US6 [facility]  06/06/2023  2:45 PM Rogers Hai, MD [facility] None  06/09/2023  1:15 PM [facility] NURSE [facility]  06/09/2023  1:30 PM [facility] US6 [facility]  06/12/2023  8:30 AM C[facility] - FT IMG 2 [facility]-FTIMG None  06/12/2023  9:30 AM Jayne Vonn DEL, MD [facility]  06/15/2023  9:15 AM [facility] NURSE [facility]  06/15/2023  9:30 AM [facility]MFC US5 [facility]   06/15/2023 10:45 AM [facility]MFC NST [facility]  06/16/2023  9:30 AM Therisa Benton PARAS, NP REA-REA None  06/26/2023  8:50 AM Marilynn Nest, DO [facility]  07/10/2023  8:30 AM [facility] - FTOBGYN US  C[facility]-FTIMG None  07/10/2023  9:30 AM Jayne Vonn DEL, MD [facility]  07/13/2023 10:30 AM [facility]-FTOBGYN NURSE [facility]  07/17/2023  8:30 AM C[facility] - FT IMG 2 [facility]-FTIMG None  07/20/2023  9:50 AM [facility]-FTOBGYN NURSE [facility]  07/24/2023  8:30 AM [facility] - FTOBGYN US  [facility]-FTIMG None  07/27/2023  9:30 AM [facility]-FTOBGYN NURSE [facility]  07/31/2023  8:30 AM [facility] - FTOBGYN US  [facility]-FTIMG None  08/03/2023  9:30 AM [facility]-FTOBGYN NURSE [facility]  08/07/2023  8:30 AM C[facility] - FT IMG 2 [facility]-FTIMG None  08/10/2023  9:30 AM [facility]-FTOBGYN NURSE [facility]  08/14/2023  8:30 AM [facility] - FTOBGYN US  [facility]-FTIMG None  08/17/2023  9:30 AM [facility]-FTOBGYN NURSE [facility]  08/21/2023  8:30 AM C[facility] - FT IMG 2 [facility]-FTIMG None  08/24/2023  9:30 AM [facility]-FTOBGYN NURSE [facility]  08/28/2023  8:30 AM C[facility] - FT IMG 2 [facility]-FTIMG None  08/31/2023  9:10 AM C[facility]-FTOBGYN NURSE [facility]-FT FTOBGYN    No orders of the defined types were placed in this encounter.  Vonn DEL Jayne  Attending Physician for the Center for [facility] 05/18/2023 11:50 AM

## 2023-05-26 ENCOUNTER — Encounter: Payer: Self-pay | Admitting: *Deleted

## 2023-05-28 DIAGNOSIS — Z419 Encounter for procedure for purposes other than remedying health state, unspecified: Secondary | ICD-10-CM | POA: Diagnosis not present

## 2023-05-30 ENCOUNTER — Inpatient Hospital Stay: Payer: Medicaid Other

## 2023-05-30 ENCOUNTER — Inpatient Hospital Stay: Payer: Medicaid Other | Attending: Hematology

## 2023-05-30 DIAGNOSIS — C9001 Multiple myeloma in remission: Secondary | ICD-10-CM

## 2023-05-30 DIAGNOSIS — C9000 Multiple myeloma not having achieved remission: Secondary | ICD-10-CM | POA: Diagnosis not present

## 2023-05-30 DIAGNOSIS — I10 Essential (primary) hypertension: Secondary | ICD-10-CM | POA: Diagnosis not present

## 2023-05-30 DIAGNOSIS — Z794 Long term (current) use of insulin: Secondary | ICD-10-CM | POA: Insufficient documentation

## 2023-05-30 DIAGNOSIS — E109 Type 1 diabetes mellitus without complications: Secondary | ICD-10-CM | POA: Diagnosis not present

## 2023-05-30 LAB — COMPREHENSIVE METABOLIC PANEL
ALT: 19 U/L (ref 0–44)
AST: 17 U/L (ref 15–41)
Albumin: 3.4 g/dL — ABNORMAL LOW (ref 3.5–5.0)
Alkaline Phosphatase: 59 U/L (ref 38–126)
Anion gap: 10 (ref 5–15)
BUN: 6 mg/dL (ref 6–20)
CO2: 19 mmol/L — ABNORMAL LOW (ref 22–32)
Calcium: 9.6 mg/dL (ref 8.9–10.3)
Chloride: 104 mmol/L (ref 98–111)
Creatinine, Ser: 0.48 mg/dL (ref 0.44–1.00)
GFR, Estimated: >60 mL/min (ref >60)
Glucose, Bld: 119 mg/dL — ABNORMAL HIGH (ref 70–99)
Potassium: 3.6 mmol/L (ref 3.5–5.1)
Sodium: 133 mmol/L — ABNORMAL LOW (ref 135–145)
Total Bilirubin: 0.5 mg/dL (ref 0.0–1.2)
Total Protein: 7.6 g/dL (ref 6.5–8.1)

## 2023-05-30 LAB — CBC WITH DIFFERENTIAL/PLATELET
Abs Immature Granulocytes: 0.05 10*3/uL (ref 0.00–0.07)
Basophils Absolute: 0 10*3/uL (ref 0.0–0.1)
Basophils Relative: 0 %
Eosinophils Absolute: 0.5 10*3/uL (ref 0.0–0.5)
Eosinophils Relative: 7 %
HCT: 38.4 % (ref 36.0–46.0)
Hemoglobin: 13.2 g/dL (ref 12.0–15.0)
Immature Granulocytes: 1 %
Lymphocytes Relative: 17 %
Lymphs Abs: 1.2 10*3/uL (ref 0.7–4.0)
MCH: 29.7 pg (ref 26.0–34.0)
MCHC: 34.4 g/dL (ref 30.0–36.0)
MCV: 86.3 fL (ref 80.0–100.0)
Monocytes Absolute: 0.6 10*3/uL (ref 0.1–1.0)
Monocytes Relative: 9 %
Neutro Abs: 4.6 10*3/uL (ref 1.7–7.7)
Neutrophils Relative %: 66 %
Platelets: 331 10*3/uL (ref 150–400)
RBC: 4.45 MIL/uL (ref 3.87–5.11)
RDW: 12.7 % (ref 11.5–15.5)
WBC: 7 10*3/uL (ref 4.0–10.5)
nRBC: 0 % (ref 0.0–0.2)

## 2023-05-30 LAB — LACTATE DEHYDROGENASE: LDH: 117 U/L (ref 98–192)

## 2023-05-31 LAB — KAPPA/LAMBDA LIGHT CHAINS
Kappa free light chain: 11.5 mg/L (ref 3.3–19.4)
Kappa, lambda light chain ratio: 1.26 (ref 0.26–1.65)
Lambda free light chains: 9.1 mg/L (ref 5.7–26.3)

## 2023-06-02 ENCOUNTER — Other Ambulatory Visit: Payer: Medicaid Other

## 2023-06-02 ENCOUNTER — Ambulatory Visit: Payer: No Typology Code available for payment source

## 2023-06-04 LAB — IMMUNOFIXATION ELECTROPHORESIS
IgA: 88 mg/dL (ref 87–352)
IgG (Immunoglobin G), Serum: 1168 mg/dL (ref 586–1602)
IgM (Immunoglobulin M), Srm: 27 mg/dL (ref 26–217)
Total Protein ELP: 7.3 g/dL (ref 6.0–8.5)

## 2023-06-06 ENCOUNTER — Other Ambulatory Visit: Payer: Self-pay | Admitting: Obstetrics & Gynecology

## 2023-06-06 ENCOUNTER — Inpatient Hospital Stay (HOSPITAL_BASED_OUTPATIENT_CLINIC_OR_DEPARTMENT_OTHER): Payer: Medicaid Other | Admitting: Hematology

## 2023-06-06 VITALS — BP 127/86 | HR 97 | Temp 98.5°F | Resp 18 | Wt 191.6 lb

## 2023-06-06 DIAGNOSIS — O365990 Maternal care for other known or suspected poor fetal growth, unspecified trimester, not applicable or unspecified: Secondary | ICD-10-CM

## 2023-06-06 DIAGNOSIS — D259 Leiomyoma of uterus, unspecified: Secondary | ICD-10-CM

## 2023-06-06 DIAGNOSIS — O09899 Supervision of other high risk pregnancies, unspecified trimester: Secondary | ICD-10-CM

## 2023-06-06 DIAGNOSIS — O24019 Pre-existing diabetes mellitus, type 1, in pregnancy, unspecified trimester: Secondary | ICD-10-CM

## 2023-06-06 DIAGNOSIS — C9001 Multiple myeloma in remission: Secondary | ICD-10-CM | POA: Diagnosis not present

## 2023-06-06 DIAGNOSIS — O0990 Supervision of high risk pregnancy, unspecified, unspecified trimester: Secondary | ICD-10-CM

## 2023-06-06 DIAGNOSIS — Z3A28 28 weeks gestation of pregnancy: Secondary | ICD-10-CM

## 2023-06-06 DIAGNOSIS — O10919 Unspecified pre-existing hypertension complicating pregnancy, unspecified trimester: Secondary | ICD-10-CM

## 2023-06-06 DIAGNOSIS — I10 Essential (primary) hypertension: Secondary | ICD-10-CM | POA: Diagnosis not present

## 2023-06-06 DIAGNOSIS — Z794 Long term (current) use of insulin: Secondary | ICD-10-CM | POA: Diagnosis not present

## 2023-06-06 DIAGNOSIS — E109 Type 1 diabetes mellitus without complications: Secondary | ICD-10-CM | POA: Diagnosis not present

## 2023-06-06 DIAGNOSIS — C9000 Multiple myeloma not having achieved remission: Secondary | ICD-10-CM | POA: Diagnosis not present

## 2023-06-06 LAB — PROTEIN ELECTROPHORESIS, SERUM
A/G Ratio: 0.9 (ref 0.7–1.7)
Albumin ELP: 3.4 g/dL (ref 2.9–4.4)
Alpha-1-Globulin: 0.4 g/dL (ref 0.0–0.4)
Alpha-2-Globulin: 0.9 g/dL (ref 0.4–1.0)
Beta Globulin: 1.4 g/dL — ABNORMAL HIGH (ref 0.7–1.3)
Gamma Globulin: 1 g/dL (ref 0.4–1.8)
Globulin, Total: 3.7 g/dL (ref 2.2–3.9)
Total Protein ELP: 7.1 g/dL (ref 6.0–8.5)

## 2023-06-06 NOTE — Progress Notes (Signed)
[facility] [address], [place] [address]    Clinic Day:  06/06/23   Referring physician: Gabriel Earing, FNP  Patient Care Team: Gabriel Earing, FNP as PCP - General (Family Medicine) Myna Hidalgo, DO as PCP - OBGYN (Obstetrics and Gynecology) Therese Sarah, RN as Oncology Nurse Navigator (Oncology) Doreatha Massed, MD as Medical Oncologist (Oncology) Dani Gobble, NP as Nurse Practitioner (Endocrinology)   ASSESSMENT & PLAN:   Assessment: 1.  Stage III IgA kappa multiple myeloma: -Presentation to [facility] in [place] with confusion and weakness. -Found to have diabetic ketoacidosis with newly diagnosed type 1 diabetes. -Also found to have acute renal failure and hypercalcemia. -Bone marrow biopsy on 02/25/2020 with flow cytometry with the population CD138 positive, CD56 positive IgA kappa monoclonal plasma cells 12%.  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 -Ultrasound abdomen on 02/23/2020 showed increased hepatic echogenicity with normal spleen. -Renal ultrasound on 02/24/2020 was normal. -CT chest on 02/22/2020 with moth eaten appearance of the bones. -Beta-2 microglobulin 2.1.  LDH 322.  SPEP and immunofixation were normal.  Kappa light chains are elevated at 36.8.  Lambda light chains 12.9 and ratio of 2.85. -24-hour urine immunofixation was negative.  M spike was negative. -PET scan on 03/23/2020 showed no FDG radiotracer activity, no soft tissue plasmacytoma.  Diffuse multiple small lytic lesions within the pelvis, spine and vertebral bodies consistent with myeloma. -Chromosome analysis was 5, XX.  Multiple myeloma FISH panel was normal. -She is considered high risk based on elevated LDH level. -4 cycles of Dara VRD from 03/31/2020 through 06/12/2020. 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 - BMBX on 12/15/2020 with variably normocellular 30 to 70% with trilineage hematopoiesis.  No increase in plasma cells.  Myeloma FISH panel was normal.  Chromosome analysis was normal. - MRD results were negative. - Maintenance Revlimid 10 mg 3 weeks on/1 week off started around 12/08/2020.   2.  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  Non-smoker. -No family history of myeloma or other malignancies.   Plan: 1.  Stage III IgA kappa multiple myeloma: - Last Revlimid was on 12/26/2022 which was discontinued after urine pregnancy test became positive on 12/28/2022. - Expected date of delivery is on 09/04/2022 but she may be induced in the first week of April per patient. - Continue to hold Revlimid. - Reviewed labs from 05/30/2023: Free light chain ratio is normal.  Immunofixation was normal.  SPEP is pending.  LFTs and creatinine are normal. - Recommend follow-up in 3 months with repeat myeloma labs.  Will follow-up on SPEP.   2.  Type I diabetes: - She is on insulin lispro and Novolin.   3.  Hypertension: - She will continue antihypertensives.  Blood pressure today is 138/108.   4.  Bone protection:  - Continue to hold Zometa throughout her pregnancy.  Orders Placed This Encounter  Procedures   CBC with Differential    Standing Status:   Future    Expected Date:   08/28/2023    Expiration Date:   06/05/2024   Comprehensive metabolic panel    Standing Status:   Future    Expected Date:   08/28/2023    Expiration Date:   06/05/2024   Immunofixation electrophoresis    Standing Status:   Future    Expected Date:   08/28/2023  Expiration Date:   06/05/2024   Kappa/lambda light chains    Standing Status:   Future    Expected Date:   08/28/2023    Expiration Date:   06/05/2024   Protein electrophoresis, serum    Standing Status:   Future    Expected Date:   08/28/2023    Expiration Date:   06/05/2024      Mikeal Hawthorne R Teague,acting as a scribe  for Doreatha Massed, MD.,have documented all relevant documentation on the behalf of Doreatha Massed, MD,as directed by  Doreatha Massed, MD while in the presence of Doreatha Massed, MD.  I, Doreatha Massed MD, have reviewed the above documentation for accuracy and completeness, and I agree with the above.     Doreatha Massed, MD   1/21/20253:37 PM  CHIEF COMPLAINT:   Diagnosis: IgA kappa multiple myeloma    Cancer Staging  No matching staging information was found for the patient.    Prior Therapy: 1. Dara RVD 2. bone marrow transplant on 09/10/2020   Current Therapy:  Revlimid 10 mg 3 weeks on/1 week off    HISTORY OF PRESENT ILLNESS:   Oncology History  Multiple myeloma (HCC)  03/09/2020 Initial Diagnosis   Multiple myeloma (HCC)   03/31/2020 - 06/12/2020 Chemotherapy            INTERVAL HISTORY:   Diamond Robbins is a 33 y.o. female presenting to clinic today for follow up of IgA kappa multiple myeloma. She was last seen by me on 02/21/23.  Patient has been seen by OBGYN for her pregnancy and was told her baby is slightly undersized, with the baby other wise being healthy. She may have a C-section don at 37 weeks, which would be around 08/15/23.    Today, she states that she is doing well overall. Her appetite level is at 100%. Her energy level is at 90%. She is no longer taking Lantus or NovoLog, and is instead taking Novolin and and lispro. 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   Hypertension    Type 1 diabetes mellitus (HCC) 02/23/2020   Vitamin D deficiency 06/21/2021    Surgical History: Past Surgical History:  Procedure Laterality Date   BONE MARROW TRANSPLANT     NO PAST SURGERIES      Social  History: Social History   Socioeconomic History   Marital status: Married    Spouse name: Diamond Robbins   Number of children: Not on file   Years of education: 12   Highest education level: High school graduate  Occupational History   Occupation: unemployed    Comment: can go back to work Jan 2023  Tobacco Use   Smoking status: Former    Types: Cigarettes    Passive exposure: Never   Smokeless tobacco: Never  Vaping Use   Vaping status: Never Used  Substance and Sexual Activity   Alcohol use: Not Currently    Comment: occ- 0-1 drinks per week; maybe 1 drink every 6 months   Drug use: No   Sexual activity: Yes    Birth control/protection: None  Other Topics Concern   Not on file  Social History Narrative   Has 2 step-daughters   Social Drivers of Corporate investment banker Strain: Low Risk  (  02/28/2023)   Overall Financial Resource Strain (CARDIA)    Difficulty of Paying Living Expenses: Not hard at all  Food Insecurity: No Food Insecurity (02/28/2023)   Hunger Vital Sign    Worried About Running Out of Food in the Last Year: Never true    Ran Out of Food in the Last Year: Never true  Transportation Needs: No Transportation Needs (02/28/2023)   PRAPARE - Administrator, Civil Service (Medical): No    Lack of Transportation (Non-Medical): No  Physical Activity: Insufficiently Active (02/28/2023)   Exercise Vital Sign    Days of Exercise per Week: 2 days    Minutes of Exercise per Session: 10 min  Stress: No Stress Concern Present (02/28/2023)   [employer] of Occupational Health - Occupational Stress Questionnaire    Feeling of Stress : Not at all  Social Connections: Socially Integrated (02/28/2023)   Social Connection and Isolation Panel [NHANES]    Frequency of Communication with Friends and Family: Three times a week    Frequency of Social Gatherings with Friends and Family: Once a week    Attends Religious Services: More than 4 times per year     Active Member of [employer] or Organizations: Yes    Attends Banker Meetings: 1 to 4 times per year    Marital Status: Married  Catering manager Violence: Not At Risk (02/28/2023)   Humiliation, Afraid, Rape, and Kick questionnaire    Fear of Current or Ex-Partner: No    Emotionally Abused: No    Physically Abused: No    Sexually Abused: No    Family History: Family History  Problem Relation Age of Onset   Hypertension Mother    Diabetes Father    Hypertension Father    Stroke Maternal Grandmother    Hypertension Maternal Grandmother    Diabetes Maternal Grandmother    Diabetes Maternal Grandfather    Hypertension Maternal Grandfather    Heart attack Maternal Grandfather    Hypertension Paternal Grandmother    Diabetes Paternal Grandfather    Hypertension Paternal Grandfather     Current Medications:  Current Outpatient Medications:    ACCU-CHEK GUIDE test strip, Use to check blood glucose 4 times daily., Disp: 125 each, Rfl: 1   Accu-Chek Softclix Lancets lancets, Use to check blood glucose 4 times daily., Disp: 200 each, Rfl: 0   aspirin EC 81 MG tablet, Take 2 tablets (162 mg total) by mouth daily. Swallow whole., Disp: 180 tablet, Rfl: 2   Blood Glucose Monitoring Suppl (ACCU-CHEK GUIDE) w/Device KIT, , Disp: , Rfl:    Blood Pressure Monitor KIT, Use to check blood pressure as instructed, Disp: 1 kit, Rfl: 0   calcium carbonate (OS-CAL) 1250 (500 Ca) MG chewable tablet, Chew 1 tablet by mouth daily., Disp: , Rfl:    cetirizine (ZYRTEC) 10 MG tablet, Take 1 tablet by mouth once daily, Disp: 30 tablet, Rfl: 0   cholecalciferol (VITAMIN D3) 25 MCG (1000 UNIT) tablet, Take 1,000 Units by mouth daily., Disp: , Rfl:    Continuous Glucose Sensor (DEXCOM G6 SENSOR) MISC, Change sensor every 10 days as directed, Disp: 9 each, Rfl: 3   Continuous Glucose Transmitter (DEXCOM G6 TRANSMITTER) MISC, Change transmitter every 90 days as directed., Disp: 1 each, Rfl: 3    fluticasone (FLONASE) 50 MCG/ACT nasal spray, Place 2 sprays into both nostrils daily., Disp: 16 g, Rfl: 6   Insulin Disposable Pump (OMNIPOD 5 G6 INTRO, GEN 5,) KIT, Change pod every  48-72 hours, Disp: 1 kit, Rfl: 0   Insulin Disposable Pump (OMNIPOD 5 G6 PODS, GEN 5,) MISC, Change pod every 48-72 hours, Disp: 15 each, Rfl: 6   insulin lispro (HUMALOG) 100 UNIT/ML injection, Use with Omnipod for TDD around 70 units daily, Disp: 60 mL, Rfl: 6   insulin NPH Human (NOVOLIN N) 100 UNIT/ML injection, Inject 0.08 mLs (8 Units total) into the skin at bedtime. (Patient taking differently: Inject 10 Units into the skin at bedtime.), Disp: 10 mL, Rfl: 3   Insulin Syringe-Needle U-100 (INSULIN SYRINGE .3CC/31GX5/16") 31G X 5/16" 0.3 ML MISC, 100 each by Does not apply route daily., Disp: 100 each, Rfl: 3   labetalol (NORMODYNE) 100 MG tablet, Take 1 tablet (100 mg total) by mouth 2 (two) times daily., Disp: 180 tablet, Rfl: 3   NIFEdipine (PROCARDIA-XL/NIFEDICAL-XL) 30 MG 24 hr tablet, Take 1 tablet (30 mg total) by mouth daily., Disp: 90 tablet, Rfl: 3   Prenatal Vit-Fe Fumarate-FA (PRENATAL VITAMIN PO), Take by mouth., Disp: , Rfl:    Allergies: No Known Allergies  REVIEW OF SYSTEMS:   Review of Systems  Constitutional:  Negative for chills, fatigue and fever.  HENT:   Negative for lump/mass, mouth sores, nosebleeds, sore throat and trouble swallowing.   Eyes:  Negative for eye problems.  Respiratory:  Negative for cough and shortness of breath.   Cardiovascular:  Negative for chest pain, leg swelling and palpitations.  Gastrointestinal:  Negative for abdominal pain, constipation, diarrhea, nausea and vomiting.  Genitourinary:  Negative for bladder incontinence, difficulty urinating, dysuria, frequency, hematuria and nocturia.   Musculoskeletal:  Negative for arthralgias, back pain, flank pain, myalgias and neck pain.  Skin:  Negative for itching and rash.  Neurological:  Negative for dizziness,  headaches and numbness.  Hematological:  Does not bruise/bleed easily.  Psychiatric/Behavioral:  Negative for depression, sleep disturbance and suicidal ideas. The patient is not nervous/anxious.   All other systems reviewed and are negative.    VITALS:   Blood pressure 127/86, pulse 97, temperature 98.5 F (36.9 C), temperature source Oral, resp. rate 18, weight 191 lb 9.6 oz (86.9 kg), last menstrual period 11/28/2022, SpO2 100%.  Wt Readings from Last 3 Encounters:  06/06/23 191 lb 9.6 oz (86.9 kg)  05/18/23 193 lb (87.5 kg)  05/01/23 190 lb 3.2 oz (86.3 kg)    Body mass index is 33.94 kg/m.  Performance status (ECOG): 1 - Symptomatic but completely ambulatory  PHYSICAL EXAM:   Physical Exam Vitals and nursing note reviewed. Exam conducted with a chaperone present.  Constitutional:      Appearance: Normal appearance.  Cardiovascular:     Rate and Rhythm: Normal rate and regular rhythm.     Pulses: Normal pulses.     Heart sounds: Normal heart sounds.  Pulmonary:     Effort: Pulmonary effort is normal.     Breath sounds: Normal breath sounds.  Abdominal:     Palpations: Abdomen is soft. There is no hepatomegaly, splenomegaly or mass.     Tenderness: There is no abdominal tenderness.  Musculoskeletal:     Right lower leg: No edema.     Left lower leg: No edema.  Lymphadenopathy:     Cervical: No cervical adenopathy.     Right cervical: No superficial, deep or posterior cervical adenopathy.    Left cervical: No superficial, deep or posterior cervical adenopathy.     Upper Body:     Right upper body: No supraclavicular or axillary adenopathy.  Left upper body: No supraclavicular or axillary adenopathy.  Neurological:     General: No focal deficit present.     Mental Status: She is alert and oriented to person, place, and time.  Psychiatric:        Mood and Affect: Mood normal.        Behavior: Behavior normal.     LABS:      Latest Ref Rng & Units 05/30/2023     2:53 PM 02/28/2023    2:47 PM 02/14/2023    8:54 AM  CBC  WBC 4.0 - 10.5 K/uL 7.0  5.2  5.4   Hemoglobin 12.0 - 15.0 g/dL 52.8  41.3  24.4   Hematocrit 36.0 - 46.0 % 38.4  41.8  40.0   Platelets 150 - 400 K/uL 331  318  270       Latest Ref Rng & Units 05/30/2023    2:53 PM 02/14/2023    8:54 AM 11/24/2022    7:51 AM  CMP  Glucose 70 - 99 mg/dL 010  272  536   BUN 6 - 20 mg/dL 6  6  13    Creatinine 0.44 - 1.00 mg/dL 6.44  0.34  7.42   Sodium 135 - 145 mmol/L 133  131  133   Potassium 3.5 - 5.1 mmol/L 3.6  3.4  3.7   Chloride 98 - 111 mmol/L 104  99  100   CO2 22 - 32 mmol/L 19  22  23    Calcium 8.9 - 10.3 mg/dL 9.6  9.2  9.1   Total Protein 6.5 - 8.1 g/dL 7.6  7.6  7.8   Total Bilirubin 0.0 - 1.2 mg/dL 0.5  0.4  0.5   Alkaline Phos 38 - 126 U/L 59  47  69   AST 15 - 41 U/L 17  23  34   ALT 0 - 44 U/L 19  22  45      No results found for: "CEA1", "CEA" / No results found for: "CEA1", "CEA" No results found for: "PSA1" No results found for: "CAN199" No results found for: "CAN125"  Lab Results  Component Value Date   TOTALPROTELP 7.1 05/30/2023   TOTALPROTELP 7.3 05/30/2023   ALBUMINELP 3.4 05/30/2023   A1GS 0.4 05/30/2023   A2GS 0.9 05/30/2023   BETS 1.4 (H) 05/30/2023   GAMS 1.0 05/30/2023   MSPIKE Not Observed 05/30/2023   SPEI Comment 05/30/2023   No results found for: "TIBC", "FERRITIN", "IRONPCTSAT" Lab Results  Component Value Date   LDH 117 05/30/2023   LDH 114 02/14/2023   LDH 152 11/24/2021     STUDIES:   [place] MFM OB FOLLOW UP Result Date: 05/16/2023 ----------------------------------------------------------------------  OBSTETRICS REPORT                       (Signed Final 05/16/2023 03:36 pm) ---------------------------------------------------------------------- Patient Info  ID #:       [id number]                          D.O.B.:  [birth year] (32 yrs)(F)  Name:       Diamond Robbins                 Visit Date: 05/16/2023 12:18 pm  ---------------------------------------------------------------------- Performed By  Attending:        Noralee Space MD        Ref. Address:     [address]  [place], [place]                                                             [address]  Performed By:     Isac Sarna        [facility]                                  [facility]  Referred By:      [facility]                    OB/GYN ---------------------------------------------------------------------- Orders  #  Description                           Code        Ordered By  1  [place] MFM OB FOLLOW UP                   76816.01    Braxton Feathers  2  [place] MFM UA CORD DOPPLER                76820.02    [facility] ----------------------------------------------------------------------  #  Order #                     Accession #                Episode #  1  [id number]                   [id number]                 [id number]  2  [id number]                   [id number]                 [id number] ---------------------------------------------------------------------- Indications  Maternal care for known or suspected poor      O36.5930  fetal growth, third trimester, single or  unspecified fetus IUGR  Maternal history of cancer, multiple myeloma   O35.8XX0  Pre-existing diabetes, type 1, in pregnancy,   O24.012  second trimester  Hypertension - Chronic/Pre-existing            O10.019  Uterine fibroids affecting pregnancy  in        O34.12, D25.9  second trimester, antepartum  Obesity complicating pregnancy, second         O99.212  trimester  [redacted] weeks gestation of pregnancy                Z3A.24 ---------------------------------------------------------------------- Fetal Evaluation  Num Of Fetuses:         1  Fetal Heart  Rate(bpm):  144  Cardiac Activity:       Observed  Presentation:           Breech  Placenta:               Posterior  P. Cord Insertion:      Marg insertion previously seen  Amniotic Fluid  AFI FV:      Within normal limits                              Largest Pocket(cm)                              3.7 ---------------------------------------------------------------------- Biometry  BPD:      55.9  mm     G. Age:  23w 0d         11  %    CI:        77.23   %    70 - 86                                                          FL/HC:      17.6   %    18.7 - 20.9  HC:      201.4  mm     G. Age:  22w 2d        < 1  %    HC/AC:      1.23        1.05 - 1.21  AC:      164.1  mm     G. Age:  21w 3d        < 1  %    FL/BPD:     63.5   %    71 - 87  FL:       35.5  mm     G. Age:  21w 2d        < 1  %    FL/AC:      21.6   %    20 - 24  LV:        3.1  mm  Est. FW:     430  gm    0 lb 15 oz     < 1  % ---------------------------------------------------------------------- Gestational Age  LMP:           24w 1d        Date:  11/28/22                  EDD:   09/04/23  U/S Today:     22w 0d  EDD:   09/19/23  Best:          24w 1d     Det. ByMarcella Dubs         EDD:   09/04/23                                      (01/24/23) ---------------------------------------------------------------------- Targeted Anatomy  Central Nervous System  Calvarium/Cranial V.:  Appears normal         Cereb./Vermis:          Previously seen  Cavum:                 Appears normal         Cisterna Magna:         Previously seen  Lateral Ventricles:    Appears normal         Midline Falx:           Previously seen  Choroid Plexus:        Previously seen  Spine  Cervical:              Previously seen        Sacral:                 Previously seen  Thoracic:              Previously seen        Shape/Curvature:        Previously seen  Lumbar:                Previously seen  Head/Neck  Lips:                  Previously  seen        Profile:                Appears normal  Neck:                  Previously seen        Orbits/Eyes:            Previously seen  Nuchal Fold:           Previously seen        Mandible:               Previously seen  Nasal Bone:            Previously seen        Maxilla:                Previously seen  Thorax  4 Chamber View:        Appears normal         Interventr. Septum:     Previously seen  Cardiac Rhythm:        Appears normal         Cardiac Axis:           Previously seen  Cardiac Situs:         Previously seen        Diaphragm:              Appears normal  Rt Outflow Tract:      Appears normal         3 Vessel View:          Previously seen  Lt Outflow Tract:  Appears normal         3 V Trachea View:       Previously seen  Aortic Arch:           Previously seen        IVC:                    Appears normal  Ductal Arch:           Previously seen        Crossing:               Previously seen  SVC:                   Appears normal  Abdomen  Ventral Wall:          Previously seen        Lt Kidney:              Appears normal  Cord Insertion:        Previously seen        Rt Kidney:              Appears normal  Situs:                 Appears normal         Bladder:                Appears normal  Stomach:               Appears normal  Extremities  Lt Humerus:            Previously seen        Lt Femur:               Previously seen  Rt Humerus:            Previously seen        Rt Femur:               Previously seen  Lt Forearm:            Previously seen        Lt Lower Leg:           Previously seen  Rt Forearm:            Previously seen        Rt Lower Leg:           Previously seen  Lt Hand:               Previously seen        Lt Foot:                ft wnl prev; heel wnl  Rt Hand:               Previously seen        Rt Foot:                ft wnl prev; heel wnll  Other  Umbilical Cord:        Single UA              Genitalia:              Female prev seen  ---------------------------------------------------------------------- Doppler - Fetal Vessels  Umbilical Artery   S/D     %tile      RI    %tile      PI    %  tile     PSV    ADFV    RDFV                                                     (cm/s)   4.29       82    0.77       81    1.25       75    39.48      No      No ---------------------------------------------------------------------- Cervix Uterus Adnexa  Cervix  Length:            3.8  cm.  Normal appearance by transabdominal scan  Uterus  Multiple fibroids noted, see table below.  Right Ovary  Not visualized.  Left Ovary  Not visualized.  Adnexa  No abnormality visualized ---------------------------------------------------------------------- Myomas  Site                     L(cm)      W(cm)      D(cm)       Location  Anterior Mid             2.17       1.05       2.08        Intramural  Posterior Fundus low     3.14       2.45       3.63        Intramural ----------------------------------------------------------------------  Blood Flow                  RI       PI       Comments ---------------------------------------------------------------------- Impression  -Multiple myeloma in remission.  -Chronic hypertension.  Patient takes nifedipine XL 30 mg  daily.  Blood pressure today at our office is 130s/95 mmHg.  -Type 1 diabetes.  Patient takes insulin and reports her  fasting levels are slightly elevated.  Fetal echocardiography is  reported as normal.  -Single umbilical artery.  On today's ultrasound, the estimated fetal weight and  abdominal circumference measurements are at the first  percentiles.  The circumference measurement is at between -  2 and -1 SD (normal).  Interval weight gain is 214 g.  Umbilical artery Doppler showed normal forward diastolic flow.  Two intramural myomas are seen (measurements above).  I explained the finding of severe fetal growth restriction.  Chronic hypertension is the most likely cause.  Other causes  include chromosomal  anomalies and infection (unlikely).  I  explained that only amniocentesis will give a definitive result  on fetal karyotype and microarray analysis to rule out some  genetic conditions.  I discussed amniocentesis and possible complication of  preterm delivery (1 and 500 procedures).  Patient opted not have amniocentesis. ---------------------------------------------------------------------- Recommendations  -UA Doppler next week.  -Weekly antenatal testing to continue.  -Twice-weekly testing may be necessary after [redacted] weeks  gestation if severe fetal growth restriction persists. ----------------------------------------------------------------------                  Noralee Space, MD Electronically Signed Final Report   05/16/2023 03:36 pm ----------------------------------------------------------------------   [place] MFM UA CORD DOPPLER Result Date: 05/16/2023 ----------------------------------------------------------------------  OBSTETRICS REPORT                       (  Signed Final 05/16/2023 03:36 pm) ---------------------------------------------------------------------- Patient Info  ID #:       [id number]                          D.O.B.:  Apr 29, 1991 (32 yrs)(F)  Name:       Diamond Robbins                 Visit Date: 05/16/2023 12:18 pm ---------------------------------------------------------------------- Performed By  Attending:        Noralee Space MD        Ref. Address:     [address]                                                             [place], [place]                                                             [address]  Performed By:     Isac Sarna        Location[facility]                                  [facility]  Referred By:      [facility]                    OB/GYN  ---------------------------------------------------------------------- Orders  #  Description                           Code        Ordered By  1  [place] MFM OB FOLLOW UP                   76816.01    BURK SCHAIBLE  2  [place] MFM UA CORD DOPPLER                66063.01    BURK SCHAIBLE ----------------------------------------------------------------------  #  Order #  Accession #                Episode #  1  [id number]                   [id number]                 [id number]  2  [id number]                   [id number]                 [id number] ---------------------------------------------------------------------- Indications  Maternal care for known or suspected poor      O36.5930  fetal growth, third trimester, single or  unspecified fetus IUGR  Maternal history of cancer, multiple myeloma   O35.8XX0  Pre-existing diabetes, type 1, in pregnancy,   O24.012  second trimester  Hypertension - Chronic/Pre-existing            O10.019  Uterine fibroids affecting pregnancy in        O34.12, D25.9  second trimester, antepartum  Obesity complicating pregnancy, second         O99.212  trimester  [redacted] weeks gestation of pregnancy                Z3A.24 ---------------------------------------------------------------------- Fetal Evaluation  Num Of Fetuses:         1  Fetal Heart Rate(bpm):  144  Cardiac Activity:       Observed  Presentation:           Breech  Placenta:               Posterior  P. Cord Insertion:      Marg insertion previously seen  Amniotic Fluid  AFI FV:      Within normal limits                              Largest Pocket(cm)                              3.7 ---------------------------------------------------------------------- Biometry  BPD:      55.9  mm     G. Age:  23w 0d         11  %    CI:        77.23   %    70 - 86                                                          FL/HC:      17.6   %    18.7 - 20.9  HC:      201.4  mm     G. Age:  22w 2d        < 1  %    HC/AC:      1.23        1.05 -  1.21  AC:      164.1  mm     G. Age:  21w 3d        < 1  %    FL/BPD:     63.5   %    71 - 87  FL:  35.5  mm     G. Age:  21w 2d        < 1  %    FL/AC:      21.6   %    20 - 24  LV:        3.1  mm  Est. FW:     430  gm    0 lb 15 oz     < 1  % ---------------------------------------------------------------------- Gestational Age  LMP:           24w 1d        Date:  11/28/22                  EDD:   09/04/23  U/S Today:     22w 0d                                        EDD:   09/19/23  Best:          24w 1d     Det. ByMarcella Dubs         EDD:   09/04/23                                      (01/24/23) ---------------------------------------------------------------------- Targeted Anatomy  Central Nervous System  Calvarium/Cranial V.:  Appears normal         Cereb./Vermis:          Previously seen  Cavum:                 Appears normal         Cisterna Magna:         Previously seen  Lateral Ventricles:    Appears normal         Midline Falx:           Previously seen  Choroid Plexus:        Previously seen  Spine  Cervical:              Previously seen        Sacral:                 Previously seen  Thoracic:              Previously seen        Shape/Curvature:        Previously seen  Lumbar:                Previously seen  Head/Neck  Lips:                  Previously seen        Profile:                Appears normal  Neck:                  Previously seen        Orbits/Eyes:            Previously seen  Nuchal Fold:           Previously seen        Mandible:               Previously seen  Nasal Bone:  Previously seen        Maxilla:                Previously seen  Thorax  4 Chamber View:        Appears normal         Interventr. Septum:     Previously seen  Cardiac Rhythm:        Appears normal         Cardiac Axis:           Previously seen  Cardiac Situs:         Previously seen        Diaphragm:              Appears normal  Rt Outflow Tract:      Appears normal         3 Vessel View:           Previously seen  Lt Outflow Tract:      Appears normal         3 V Trachea View:       Previously seen  Aortic Arch:           Previously seen        IVC:                    Appears normal  Ductal Arch:           Previously seen        Crossing:               Previously seen  SVC:                   Appears normal  Abdomen  Ventral Wall:          Previously seen        Lt Kidney:              Appears normal  Cord Insertion:        Previously seen        Rt Kidney:              Appears normal  Situs:                 Appears normal         Bladder:                Appears normal  Stomach:               Appears normal  Extremities  Lt Humerus:            Previously seen        Lt Femur:               Previously seen  Rt Humerus:            Previously seen        Rt Femur:               Previously seen  Lt Forearm:            Previously seen        Lt Lower Leg:           Previously seen  Rt Forearm:            Previously seen        Rt Lower Leg:           Previously seen  Lt  Hand:               Previously seen        Lt Foot:                ft wnl prev; heel wnl  Rt Hand:               Previously seen        Rt Foot:                ft wnl prev; heel wnll  Other  Umbilical Cord:        Single UA              Genitalia:              Female prev seen ---------------------------------------------------------------------- Doppler - Fetal Vessels  Umbilical Artery   S/D     %tile      RI    %tile      PI    %tile     PSV    ADFV    RDFV                                                     (cm/s)   4.29       82    0.77       81    1.25       75    39.48      No      No ---------------------------------------------------------------------- Cervix Uterus Adnexa  Cervix  Length:            3.8  cm.  Normal appearance by transabdominal scan  Uterus  Multiple fibroids noted, see table below.  Right Ovary  Not visualized.  Left Ovary  Not visualized.  Adnexa  No abnormality visualized  ---------------------------------------------------------------------- Myomas  Site                     L(cm)      W(cm)      D(cm)       Location  Anterior Mid             2.17       1.05       2.08        Intramural  Posterior Fundus low     3.14       2.45       3.63        Intramural ----------------------------------------------------------------------  Blood Flow                  RI       PI       Comments ---------------------------------------------------------------------- Impression  -Multiple myeloma in remission.  -Chronic hypertension.  Patient takes nifedipine XL 30 mg  daily.  Blood pressure today at our office is 130s/95 mmHg.  -Type 1 diabetes.  Patient takes insulin and reports her  fasting levels are slightly elevated.  Fetal echocardiography is  reported as normal.  -Single umbilical artery.  On today's ultrasound, the estimated fetal weight and  abdominal circumference measurements are at the first  percentiles.  The circumference measurement is at between -  2 and -1 SD (normal).  Interval weight gain is 214 g.  Umbilical artery Doppler showed normal forward diastolic flow.  Two intramural myomas are seen (measurements above).  I explained the finding of severe fetal growth restriction.  Chronic hypertension is the most likely cause.  Other causes  include chromosomal anomalies and infection (unlikely).  I  explained that only amniocentesis will give a definitive result  on fetal karyotype and microarray analysis to rule out some  genetic conditions.  I discussed amniocentesis and possible complication of  preterm delivery (1 and 500 procedures).  Patient opted not have amniocentesis. ---------------------------------------------------------------------- Recommendations  -UA Doppler next week.  -Weekly antenatal testing to continue.  -Twice-weekly testing may be necessary after [redacted] weeks  gestation if severe fetal growth restriction persists.  ----------------------------------------------------------------------                  Noralee Space, MD Electronically Signed Final Report   05/16/2023 03:36 pm ----------------------------------------------------------------------

## 2023-06-06 NOTE — Patient Instructions (Addendum)
[facility] at [facility] Discharge Instructions   You were seen and examined today by Dr. Ellin Saba.  He reviewed the results of your lab work which are normal/stable.   Continue to hold Revlimid.   We will see you back in 3 months. We will repeat lab work prior to this visit.  Return as scheduled.    Thank you for choosing [facility] at [facility] to provide your oncology and hematology care.  To afford each patient quality time with our provider, please arrive at least 15 minutes before your scheduled appointment time.   If you have a lab appointment with the Cancer Center please come in thru the Main Entrance and check in at the main information desk.  You need to re-schedule your appointment should you arrive 10 or more minutes late.  We strive to give you quality time with our providers, and arriving late affects you and other patients whose appointments are after yours.  Also, if you no show three or more times for appointments you may be dismissed from the clinic at the providers discretion.     Again, thank you for choosing [facility].  Our hope is that these requests will decrease the amount of time that you wait before being seen by our physicians.       _____________________________________________________________  Should you have questions after your visit to [facility], please contact our office at [phone] and follow the prompts.  Our office hours are 8:00 a.m. and 4:30 p.m. Monday - Friday.  Please note that voicemails left after 4:00 p.m. may not be returned until the following business day.  We are closed weekends and major holidays.  You do have access to a nurse 24-7, just call the main number to the clinic [phone] and do not press any options, hold on the line and a nurse will answer the phone.    For prescription refill requests, have your pharmacy contact our office and allow 72  hours.    Due to Covid, you will need to wear a mask upon entering the hospital. If you do not have a mask, a mask will be given to you at the Main Entrance upon arrival. For doctor visits, patients may have 1 support person age 85 or older with them. For treatment visits, patients can not have anyone with them due to social distancing guidelines and our immunocompromised population.

## 2023-06-09 ENCOUNTER — Other Ambulatory Visit: Payer: Self-pay

## 2023-06-09 ENCOUNTER — Ambulatory Visit (HOSPITAL_BASED_OUTPATIENT_CLINIC_OR_DEPARTMENT_OTHER): Payer: Medicaid Other | Admitting: Obstetrics

## 2023-06-09 ENCOUNTER — Other Ambulatory Visit: Payer: Self-pay | Admitting: *Deleted

## 2023-06-09 ENCOUNTER — Ambulatory Visit: Payer: Medicaid Other | Attending: Obstetrics and Gynecology

## 2023-06-09 ENCOUNTER — Ambulatory Visit: Payer: Medicaid Other | Admitting: *Deleted

## 2023-06-09 VITALS — BP 146/99 | HR 107

## 2023-06-09 DIAGNOSIS — E109 Type 1 diabetes mellitus without complications: Secondary | ICD-10-CM

## 2023-06-09 DIAGNOSIS — C9000 Multiple myeloma not having achieved remission: Secondary | ICD-10-CM

## 2023-06-09 DIAGNOSIS — O24012 Pre-existing diabetes mellitus, type 1, in pregnancy, second trimester: Secondary | ICD-10-CM | POA: Diagnosis not present

## 2023-06-09 DIAGNOSIS — O10912 Unspecified pre-existing hypertension complicating pregnancy, second trimester: Secondary | ICD-10-CM | POA: Diagnosis not present

## 2023-06-09 DIAGNOSIS — O0992 Supervision of high risk pregnancy, unspecified, second trimester: Secondary | ICD-10-CM

## 2023-06-09 DIAGNOSIS — D259 Leiomyoma of uterus, unspecified: Secondary | ICD-10-CM

## 2023-06-09 DIAGNOSIS — O365920 Maternal care for other known or suspected poor fetal growth, second trimester, not applicable or unspecified: Secondary | ICD-10-CM | POA: Insufficient documentation

## 2023-06-09 DIAGNOSIS — O99212 Obesity complicating pregnancy, second trimester: Secondary | ICD-10-CM

## 2023-06-09 DIAGNOSIS — O10012 Pre-existing essential hypertension complicating pregnancy, second trimester: Secondary | ICD-10-CM

## 2023-06-09 DIAGNOSIS — Z3A27 27 weeks gestation of pregnancy: Secondary | ICD-10-CM | POA: Diagnosis not present

## 2023-06-09 DIAGNOSIS — O09899 Supervision of other high risk pregnancies, unspecified trimester: Secondary | ICD-10-CM | POA: Diagnosis not present

## 2023-06-09 DIAGNOSIS — E669 Obesity, unspecified: Secondary | ICD-10-CM | POA: Diagnosis not present

## 2023-06-09 DIAGNOSIS — O3412 Maternal care for benign tumor of corpus uteri, second trimester: Secondary | ICD-10-CM

## 2023-06-09 DIAGNOSIS — O24319 Unspecified pre-existing diabetes mellitus in pregnancy, unspecified trimester: Secondary | ICD-10-CM | POA: Insufficient documentation

## 2023-06-09 DIAGNOSIS — O283 Abnormal ultrasonic finding on antenatal screening of mother: Secondary | ICD-10-CM

## 2023-06-09 DIAGNOSIS — O365930 Maternal care for other known or suspected poor fetal growth, third trimester, not applicable or unspecified: Secondary | ICD-10-CM | POA: Insufficient documentation

## 2023-06-09 DIAGNOSIS — O365990 Maternal care for other known or suspected poor fetal growth, unspecified trimester, not applicable or unspecified: Secondary | ICD-10-CM

## 2023-06-09 NOTE — Progress Notes (Signed)
MFM Consult Note  Diamond Robbins is currently at 27 weeks and 4 days.  She has been followed due to IUGR.  Her pregnancy has been complicated by type 1 diabetes, chronic hypertension treated with nifedipine 30 mg daily and labetalol 100 mg twice a day, and history of multiple myeloma.   Her blood pressures today were 146/99 and 137/108.  The patient reports that she forgot to take her nifedipine this morning.  She currently has a Dexcom continuous glucose monitor and an Omnipod insulin pump in place.    The patient had a normal fetal echocardiogram performed with [facility] pediatric cardiology.  On today's exam, the EFW of 729 g (1 pound 10 ounces) measures at the < 1  percentile for her gestational age indicating severe IUGR.  The fetus grew 11 ounces over the past 3 weeks.   The total AFI was 15.09 cm (within normal limits).  Fetal movements were noted throughout today's exam.    The posterior fundal placenta appeared within normal limits.  Doppler studies of the umbilical arteries showed an elevated S/D ratio of 4.47 .  There were no signs of absent or reversed end-diastolic flow.    Due to severe IUGR, we will continue to follow her with weekly fetal testing and umbilical artery Doppler studies.    The patient was encouraged to maintain normal glycemic control and to control her blood pressures to help improve her obstetrical outcome.  Should her blood pressures continue to be elevated, the dosage of her antihypertensive medications may need to be increased.    The increased risk of superimposed preeclampsia was discussed today.  She should continue taking 2 tablets of baby aspirin daily for preeclampsia prophylaxis.  The patient was advised that we will reassess the fetal growth in 3 weeks.  As long as the fetus continues to show appropriate growth and her umbilical artery Doppler studies and fetal testing remain within normal limits, we will try to delay delivery for as long as we  can.  The goal for her delivery would be at 34 weeks or greater.    She will return in 1 week for an NST, umbilical artery Doppler study, and amniotic fluid check.    Fetal kick count instructions were reviewed.    The patient and her partner stated that all of their questions were answered today.  A total of 30 minutes was spent counseling and coordinating the care for this patient.  Greater than 50% of the time was spent in direct face-to-face contact.

## 2023-06-12 ENCOUNTER — Ambulatory Visit (INDEPENDENT_AMBULATORY_CARE_PROVIDER_SITE_OTHER): Payer: Medicaid Other | Admitting: Radiology

## 2023-06-12 ENCOUNTER — Encounter: Payer: Self-pay | Admitting: Obstetrics & Gynecology

## 2023-06-12 ENCOUNTER — Ambulatory Visit (INDEPENDENT_AMBULATORY_CARE_PROVIDER_SITE_OTHER): Payer: Medicaid Other | Admitting: Obstetrics & Gynecology

## 2023-06-12 VITALS — BP 135/95 | HR 102 | Wt 191.0 lb

## 2023-06-12 DIAGNOSIS — O0990 Supervision of high risk pregnancy, unspecified, unspecified trimester: Secondary | ICD-10-CM | POA: Diagnosis not present

## 2023-06-12 DIAGNOSIS — O0993 Supervision of high risk pregnancy, unspecified, third trimester: Secondary | ICD-10-CM | POA: Diagnosis not present

## 2023-06-12 DIAGNOSIS — O10919 Unspecified pre-existing hypertension complicating pregnancy, unspecified trimester: Secondary | ICD-10-CM

## 2023-06-12 DIAGNOSIS — O3413 Maternal care for benign tumor of corpus uteri, third trimester: Secondary | ICD-10-CM

## 2023-06-12 DIAGNOSIS — O10913 Unspecified pre-existing hypertension complicating pregnancy, third trimester: Secondary | ICD-10-CM

## 2023-06-12 DIAGNOSIS — O365930 Maternal care for other known or suspected poor fetal growth, third trimester, not applicable or unspecified: Secondary | ICD-10-CM

## 2023-06-12 DIAGNOSIS — O24013 Pre-existing diabetes mellitus, type 1, in pregnancy, third trimester: Secondary | ICD-10-CM

## 2023-06-12 DIAGNOSIS — O24313 Unspecified pre-existing diabetes mellitus in pregnancy, third trimester: Secondary | ICD-10-CM

## 2023-06-12 DIAGNOSIS — O24319 Unspecified pre-existing diabetes mellitus in pregnancy, unspecified trimester: Secondary | ICD-10-CM

## 2023-06-12 DIAGNOSIS — O365990 Maternal care for other known or suspected poor fetal growth, unspecified trimester, not applicable or unspecified: Secondary | ICD-10-CM

## 2023-06-12 DIAGNOSIS — O0992 Supervision of high risk pregnancy, unspecified, second trimester: Secondary | ICD-10-CM

## 2023-06-12 DIAGNOSIS — O09899 Supervision of other high risk pregnancies, unspecified trimester: Secondary | ICD-10-CM

## 2023-06-12 DIAGNOSIS — O09893 Supervision of other high risk pregnancies, third trimester: Secondary | ICD-10-CM | POA: Diagnosis not present

## 2023-06-12 DIAGNOSIS — D259 Leiomyoma of uterus, unspecified: Secondary | ICD-10-CM | POA: Diagnosis not present

## 2023-06-12 DIAGNOSIS — Z3A28 28 weeks gestation of pregnancy: Secondary | ICD-10-CM

## 2023-06-12 DIAGNOSIS — O3412 Maternal care for benign tumor of corpus uteri, second trimester: Secondary | ICD-10-CM

## 2023-06-12 DIAGNOSIS — O24019 Pre-existing diabetes mellitus, type 1, in pregnancy, unspecified trimester: Secondary | ICD-10-CM

## 2023-06-12 NOTE — Progress Notes (Signed)
HIGH-RISK PREGNANCY VISIT Patient name: Diamond Robbins MRN [id number]  Date of birth: 10-Jan-1991 Chief Complaint:   Routine Prenatal Visit  History of Present Illness:   Diamond Robbins is a 33 y.o. G26P0010 female at [redacted]w[redacted]d with an Estimated Date of Delivery: 09/04/23 being seen today for ongoing management of a high-risk pregnancy complicated by Class B Type 1 diabetes.    Today she reports no complaints. Contractions: Not present. Vag. Bleeding: None.  Movement: Present. denies leaking of fluid.      02/28/2023    9:39 AM 12/30/2022    9:23 AM 06/30/2022   11:24 AM 09/09/2021   10:53 AM 07/22/2021    9:44 AM  Depression screen PHQ 2/9  Decreased Interest 0 0 0 0 0  Down, Depressed, Hopeless 0 0 0 0 0  PHQ - 2 Score 0 0 0 0 0  Altered sleeping 0 0 0    Tired, decreased energy 0 0 0    Change in appetite 0 0 0    Feeling bad or failure about yourself  0 0 0    Trouble concentrating 0 0 0    Moving slowly or fidgety/restless 0 0 0    Suicidal thoughts 0 0 0    PHQ-9 Score 0 0 0    Difficult doing work/chores  Not difficult at all Not difficult at all          02/28/2023    9:39 AM 12/30/2022    9:22 AM 06/30/2022   11:24 AM  GAD 7 : Generalized Anxiety Score  Nervous, Anxious, on Edge 0 0 0  Control/stop worrying 0 0 0  Worry too much - different things 0 0 0  Trouble relaxing 0 0 0  Restless 0 0 0  Easily annoyed or irritable 0 0 0  Afraid - awful might happen 0 0 0  Total GAD 7 Score 0 0 0  Anxiety Difficulty  Not difficult at all Not difficult at all     Review of Systems:   Pertinent items are noted in HPI Denies abnormal vaginal discharge w/ itching/odor/irritation, headaches, visual changes, shortness of breath, chest pain, abdominal pain, severe nausea/vomiting, or problems with urination or bowel movements unless otherwise stated above. Pertinent History Reviewed:  Reviewed past medical,surgical, social, obstetrical and family history.  Reviewed problem list,  medications and allergies. Physical Assessment:   Vitals:   06/12/23 0927 06/12/23 1011  BP: (!) 127/92 (!) 135/95  Pulse: (!) 105 (!) 102  Weight: 191 lb (86.6 kg)   Body mass index is 33.83 kg/m.           Physical Examination:   General appearance: alert, well appearing, and in no distress  Mental status: alert, oriented to person, place, and time  Skin: warm & dry   Extremities:      Cardiovascular: normal heart rate noted  Respiratory: normal respiratory effort, no distress  Abdomen: gravid, soft, non-tender  Pelvic: Cervical exam deferred         Fetal Status:     Movement: Present    Fetal Surveillance Testing today: BPP 8/8 normal UAD   Chaperone: N/A    No results found for this or any previous visit (from the past 24 hours).  Assessment & Plan:  High-risk pregnancy: G2P0010 at [redacted]w[redacted]d with an Estimated Date of Delivery: 09/04/23      ICD-10-CM   1. Supervision of high risk pregnancy in second trimester  O09.92     2. Class  B DM(type 1) with pump + Omnipod  O24.319     3. Chronic hypertension: labetalol 100 BID  O10.919     4. Fetal growth restriction: severe less than 0.3%  O36.5990       Weekly BPP + UAD til 32 weeks then twice weekly testing  Meds: No orders of the defined types were placed in this encounter.   Orders: No orders of the defined types were placed in this encounter.    Labs/procedures today: U/S  Treatment Plan:  as above  Reviewed: Preterm labor symptoms and general obstetric precautions including but not limited to vaginal bleeding, contractions, leaking of fluid and fetal movement were reviewed in detail with the patient.  All questions were answered. Does have home bp cuff. Office bp cuff given: not applicable. Check bp twice daily, let us know if consistently >150 and/or >95.  Follow-up: Return in about 1 week (around 06/19/2023) for (+ next 4 weeks) weekly BPP + Doppler, then start 2xweek @32  weeks.   Future Appointments  Date  Time Provider Department Center  06/19/2023  8:30 AM [facility] - [facility] [place] [facility] None  06/19/2023  9:30 AM Myna Hidalgo, DO [facility]  06/26/2023  1:30 PM [facility] - FT IMG 2 [facility] None  06/26/2023  2:30 PM Myna Hidalgo, DO [facility]  07/03/2023 10:45 AM [facility] - FT IMG 2 [facility] None  07/03/2023 11:10 AM Lazaro Arms, MD [facility]  07/10/2023  8:30 AM [facility] [place] [facility] None  07/10/2023  9:30 AM Lazaro Arms, MD [facility]  07/13/2023 10:30 AM [facility] NURSE [facility]  07/17/2023  8:30 AM [facility] - FT IMG 2 [facility] None  07/17/2023  9:30 AM Myna Hidalgo, DO [facility]  07/20/2023  9:50 AM [facility] NURSE [facility]  07/24/2023  8:30 AM [facility] - [facility] [place] [facility] None  07/24/2023  9:30 AM Lazaro Arms, MD [facility]  07/27/2023  9:30 AM [facility] NURSE [facility]  07/31/2023  8:30 AM [facility] - [facility] [place] [facility] None  07/31/2023  9:30 AM Myna Hidalgo, DO [facility]  08/03/2023  9:30 AM [facility] NURSE [facility]  08/07/2023  8:30 AM [facility] - FT IMG 2 [facility] None  08/07/2023  9:30 AM Myna Hidalgo, DO [facility]TO[facility]  08/10/2023  9:30 AM [facility] NURSE [facility]TO[facility]  08/14/2023  8:30 AM [facility] - [facility] [place] [facility] None  08/14/2023  9:30 AM Lazaro Arms, MD [facility]TO[facility]  08/17/2023  9:30 AM [facility] NURSE [facility]  08/21/2023  8:30 AM [facility] - FT IMG 2 [facility] None  08/24/2023  9:30 AM [facility] NURSE [facility] FTO[facility]YN  08/28/2023  8:30 AM [facility] - FT IMG 2 [facility] None  08/31/2023  9:10 AM [facility]FT[facility] NURSE [facility] FTOBGYN  09/15/2023  8:30 AM Dani Gobble, NP [facility] None  10/04/2023 10:45 AM [facility] LAB [facility] None  10/11/2023 11:45 AM Doreatha Massed, MD [facility] None    No orders of the defined types were placed in this encounter.  Lazaro Arms  Attending Physician for the Center for [facility] 06/19/2023 8:21 AM

## 2023-06-12 NOTE — Progress Notes (Signed)
[place]:   GA = 28 weeks Single active female fetus,  Homero Fellers Breech,  FHR = 132 bpm Posterior Left Lateral pl, gr 1,   AFI = 10.4 cm  9%  MVP = 4.38 cm EFW 0.3% <1%  759g  BPP = 8/8   RI: 0.66, 0.73  42% CL = 4 cm,  closed     nl ov's

## 2023-06-15 ENCOUNTER — Ambulatory Visit: Payer: No Typology Code available for payment source

## 2023-06-15 ENCOUNTER — Ambulatory Visit: Payer: Medicaid Other

## 2023-06-16 ENCOUNTER — Other Ambulatory Visit: Payer: Self-pay | Admitting: Obstetrics & Gynecology

## 2023-06-16 ENCOUNTER — Ambulatory Visit (INDEPENDENT_AMBULATORY_CARE_PROVIDER_SITE_OTHER): Payer: Medicaid Other | Admitting: Nurse Practitioner

## 2023-06-16 ENCOUNTER — Encounter: Payer: Self-pay | Admitting: Nurse Practitioner

## 2023-06-16 VITALS — BP 121/88 | HR 92 | Ht 63.0 in | Wt 191.0 lb

## 2023-06-16 DIAGNOSIS — O24013 Pre-existing diabetes mellitus, type 1, in pregnancy, third trimester: Secondary | ICD-10-CM

## 2023-06-16 DIAGNOSIS — E782 Mixed hyperlipidemia: Secondary | ICD-10-CM

## 2023-06-16 DIAGNOSIS — Z794 Long term (current) use of insulin: Secondary | ICD-10-CM

## 2023-06-16 DIAGNOSIS — E1065 Type 1 diabetes mellitus with hyperglycemia: Secondary | ICD-10-CM | POA: Diagnosis not present

## 2023-06-16 DIAGNOSIS — I10 Essential (primary) hypertension: Secondary | ICD-10-CM | POA: Diagnosis not present

## 2023-06-16 DIAGNOSIS — E1010 Type 1 diabetes mellitus with ketoacidosis without coma: Secondary | ICD-10-CM

## 2023-06-16 DIAGNOSIS — O365931 Maternal care for other known or suspected poor fetal growth, third trimester, fetus 1: Secondary | ICD-10-CM

## 2023-06-16 LAB — POCT GLYCOSYLATED HEMOGLOBIN (HGB A1C): Hemoglobin A1C: 6.6 % — AB (ref 4.0–5.6)

## 2023-06-16 NOTE — Progress Notes (Signed)
Endocrinology Follow Up Note       06/16/2023, 10:08 AM   Subjective:    Patient ID: Diamond Robbins, female    DOB: [birth year].  Diamond Robbins is being seen in follow up after being seen in consultation for management of currently uncontrolled symptomatic diabetes requested by  Gabriel Earing, FNP.   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) 06/21/2021   Hypertension    Type 1 diabetes mellitus (HCC) 02/23/2020   Vitamin D deficiency 06/21/2021    Past Surgical History:  Procedure Laterality Date   BONE MARROW TRANSPLANT     NO PAST SURGERIES      Social History   Socioeconomic History   Marital status: Married    Spouse name: Rolm Gala   Number of children: Not on file   Years of education: 12   Highest education level: High school graduate  Occupational History   Occupation: unemployed    Comment: can go back to work Jan 2023  Tobacco Use   Smoking status: Former    Types: Cigarettes    Passive exposure: Never   Smokeless tobacco: Never  Vaping Use   Vaping status: Never Used  Substance and Sexual Activity   Alcohol use: Not Currently    Comment: occ- 0-1 drinks per week; maybe 1 drink every 6 months   Drug use: No   Sexual activity: Yes    Birth control/protection: None  Other Topics Concern   Not on file  Social History Narrative   Has 2 step-daughters   Social Drivers of Health   Financial Resource Strain: Low Risk  (02/28/2023)   Overall Financial Resource Strain (CARDIA)    Difficulty of Paying Living Expenses: Not hard at all  Food Insecurity: No Food Insecurity (02/28/2023)   Hunger Vital Sign    Worried About Running Out of Food in the Last Year: Never true    Ran Out of Food in the Last Year: Never true  Transportation Needs: No  Transportation Needs (02/28/2023)   PRAPARE - Administrator, Civil Service (Medical): No    Lack of Transportation (Non-Medical): No  Physical Activity: Insufficiently Active (02/28/2023)   Exercise Vital Sign    Days of Exercise per Week: 2 days    Minutes of Exercise per Session: 10 min  Stress: No Stress Concern Present (02/28/2023)   [employer] of Occupational Health - Occupational Stress Questionnaire    Feeling of Stress : Not at all  Social Connections: Socially Integrated (02/28/2023)   Social Connection and Isolation Panel [NHANES]    Frequency of Communication with Friends and Family: Three times a week    Frequency of Social Gatherings with Friends and Family: Once a week    Attends Religious Services: More than 4 times per year    Active Member of [employer] or Organizations: Yes    Attends Banker Meetings: 1 to 4 times per year    Marital Status: Married    Family History  Problem Relation Age of Onset   Hypertension  Mother    Diabetes Father    Hypertension Father    Stroke Maternal Grandmother    Hypertension Maternal Grandmother    Diabetes Maternal Grandmother    Diabetes Maternal Grandfather    Hypertension Maternal Grandfather    Heart attack Maternal Grandfather    Hypertension Paternal Grandmother    Diabetes Paternal Grandfather    Hypertension Paternal Grandfather     Outpatient Encounter Medications as of 06/16/2023  Medication Sig   ACCU-CHEK GUIDE test strip Use to check blood glucose 4 times daily.   Accu-Chek Softclix Lancets lancets Use to check blood glucose 4 times daily.   aspirin EC 81 MG tablet Take 2 tablets (162 mg total) by mouth daily. Swallow whole.   Blood Glucose Monitoring Suppl (ACCU-CHEK GUIDE) w/Device KIT    Blood Pressure Monitor KIT Use to check blood pressure as instructed   calcium carbonate (OS-CAL) 1250 (500 Ca) MG chewable tablet Chew 1 tablet by mouth daily.   cetirizine (ZYRTEC) 10 MG tablet  Take 1 tablet by mouth once daily (Patient not taking: Reported on 06/12/2023)   cholecalciferol (VITAMIN D3) 25 MCG (1000 UNIT) tablet Take 1,000 Units by mouth daily.   Continuous Glucose Sensor (DEXCOM G6 SENSOR) MISC Change sensor every 10 days as directed   Continuous Glucose Transmitter (DEXCOM G6 TRANSMITTER) MISC Change transmitter every 90 days as directed.   fluticasone (FLONASE) 50 MCG/ACT nasal spray Place 2 sprays into both nostrils daily. (Patient not taking: Reported on 06/12/2023)   Insulin Disposable Pump (OMNIPOD 5 G6 INTRO, GEN 5,) KIT Change pod every 48-72 hours   Insulin Disposable Pump (OMNIPOD 5 G6 PODS, GEN 5,) MISC Change pod every 48-72 hours   insulin lispro (HUMALOG) 100 UNIT/ML injection Use with Omnipod for TDD around 70 units daily   Insulin Syringe-Needle U-100 (INSULIN SYRINGE .3CC/31GX5/16") 31G X 5/16" 0.3 ML MISC 100 each by Does not apply route daily.   labetalol (NORMODYNE) 100 MG tablet Take 1 tablet (100 mg total) by mouth 2 (two) times daily.   NIFEdipine (PROCARDIA-XL/NIFEDICAL-XL) 30 MG 24 hr tablet Take 1 tablet (30 mg total) by mouth daily.   Prenatal Vit-Fe Fumarate-FA (PRENATAL VITAMIN PO) Take by mouth.   [DISCONTINUED] insulin NPH Human (NOVOLIN N) 100 UNIT/ML injection Inject 0.08 mLs (8 Units total) into the skin at bedtime. (Patient taking differently: Inject 16 Units into the skin at bedtime.)   No facility-administered encounter medications on file as of 06/16/2023.    ALLERGIES: No Known Allergies  VACCINATION STATUS: Immunization History  Administered Date(s) Administered   DTaP / HiB / IPV 03/24/2021   DTaP / IPV 07/16/2021, 09/16/2021   HIB (PRP-T) 07/16/2021, 09/16/2021   Hepatitis B 03/24/2021   Hepatitis B, ADULT 03/24/2021, 07/16/2021, 12/01/2021   Influenza, Seasonal, Injecte, Preservative Fre 05/01/2023   Influenza,inj,Quad PF,6+ Mos 03/16/2021   Pneumococcal Conjugate-13 03/24/2021, 07/16/2021, 09/16/2021   Pneumococcal  Polysaccharide-23 12/01/2021    Diabetes She presents for her follow-up diabetic visit. She has type 1 diabetes mellitus. Onset time: Diagnosed at approx age of 33. Her disease course has been improving. There are no hypoglycemic associated symptoms. Associated symptoms include blurred vision and fatigue. Pertinent negatives for diabetes include no polydipsia, no polyuria and no weight loss. There are no hypoglycemic complications. Symptoms are improving. There are no diabetic complications. Risk factors for coronary artery disease include diabetes mellitus, dyslipidemia, hypertension and sedentary lifestyle. Current diabetic treatment includes insulin pump (OBGYN also started on NPH). She is compliant with treatment most of  the time. Her weight is fluctuating minimally. She is following a generally healthy diet. When asked about meal planning, she reported none. She has not had a previous visit with a dietitian. She participates in exercise intermittently. Her home blood glucose trend is decreasing steadily. Her overall blood glucose range is 140-180 mg/dl. (She presents today with her Omnipod 5 Dexcom combo showing improving glycemic profile.  Her POCT A1c today is 6.6%, improving from last visit of 8.5%.  She notes her OB put her on NPH 14 units at bedtime to help improve glycemic control and also adjusted her insulin to carb ratio.  Analysis of her CGM shows TIR 79%, TAR 21%, TBR 0% with a GMI of 6.8%.  She does note some severe hypoglycemia recently overnight in the 30s.  She notes she does perform fingersticks and it shows higher reading.  I did walk her through the process of properly calibrating the Dexcom so it is reading properly.) An ACE inhibitor/angiotensin II receptor blocker is not being taken. She does not see a podiatrist.Eye exam is current.  Hypertension This is a chronic problem. The current episode started more than 1 year ago. The problem has been resolved since onset. The problem is  controlled. Associated symptoms include blurred vision. There are no associated agents to hypertension. Risk factors for coronary artery disease include diabetes mellitus, dyslipidemia, family history and sedentary lifestyle. Past treatments include beta blockers and calcium channel blockers. The current treatment provides moderate improvement. Compliance problems include exercise and diet.   Hyperlipidemia This is a chronic problem. The current episode started more than 1 year ago. The problem is controlled. Recent lipid tests were reviewed and are normal. Exacerbating diseases include diabetes and obesity. Factors aggravating her hyperlipidemia include beta blockers and fatty foods. Current antihyperlipidemic treatment includes statins. The current treatment provides moderate improvement of lipids. Compliance problems include adherence to diet and adherence to exercise.  Risk factors for coronary artery disease include diabetes mellitus, dyslipidemia, family history, female sex, hypertension and a sedentary lifestyle.    Review of systems  Constitutional: + Minimally fluctuating body weight,  current Body mass index is 33.83 kg/m. , no fatigue, no subjective hyperthermia, no subjective hypothermia Eyes: no blurry vision, no xerophthalmia ENT: no sore throat, no nodules palpated in throat, no dysphagia/odynophagia, no hoarseness Cardiovascular: no chest pain, no shortness of breath, no palpitations, no leg swelling Respiratory: no cough, no shortness of breath Gastrointestinal: no nausea/vomiting/diarrhea Musculoskeletal: no muscle/joint aches Skin: no rashes, no hyperemia Neurological: no tremors, no numbness, no tingling, no dizziness Psychiatric: no depression, no anxiety  Objective:     BP 121/88   Pulse 92   Ht 5\' 3"  (1.6 m)   Wt 191 lb (86.6 kg)   LMP 11/28/2022   BMI 33.83 kg/m   Wt Readings from Last 3 Encounters:  06/16/23 191 lb (86.6 kg)  06/12/23 191 lb (86.6 kg)  06/06/23  191 lb 9.6 oz (86.9 kg)     BP Readings from Last 3 Encounters:  06/16/23 121/88  06/12/23 (!) 135/95  06/09/23 (!) 137/108      Physical Exam- Limited  Constitutional:  Body mass index is 33.83 kg/m. , not in acute distress, normal state of mind Eyes:  EOMI, no exophthalmos Neck: Supple Musculoskeletal: no gross deformities, strength intact in all four extremities, no gross restriction of joint movements Skin:  no rashes, no hyperemia Neurological: no tremor with outstretched hands   Diabetic Foot Exam - Simple   No data filed  CMP ( most recent) CMP     Component Value Date/Time   NA 133 (L) 05/30/2023 1453   NA 138 02/18/2021 0934   K 3.6 05/30/2023 1453   CL 104 05/30/2023 1453   CO2 19 (L) 05/30/2023 1453   GLUCOSE 119 (H) 05/30/2023 1453   BUN 6 05/30/2023 1453   BUN 11 02/18/2021 0934   CREATININE 0.48 05/30/2023 1453   CALCIUM 9.6 05/30/2023 1453   PROT 7.6 05/30/2023 1453   PROT 7.7 02/18/2021 0934   ALBUMIN 3.4 (L) 05/30/2023 1453   ALBUMIN 5.2 (H) 02/18/2021 0934   AST 17 05/30/2023 1453   ALT 19 05/30/2023 1453   ALKPHOS 59 05/30/2023 1453   BILITOT 0.5 05/30/2023 1453   BILITOT 0.3 02/18/2021 0934   GFRNONAA >60 05/30/2023 1453     Diabetic Labs (most recent): Lab Results  Component Value Date   HGBA1C 6.6 (A) 06/16/2023   HGBA1C 8.5 (A) 01/25/2023   HGBA1C 8.2 (A) 07/19/2022   MICROALBUR 150mg /L 07/19/2022   MICROALBUR 150 06/24/2021     Lipid Panel ( most recent) Lipid Panel     Component Value Date/Time   CHOL 158 06/21/2021 1011   TRIG 125 06/21/2021 1011   HDL 62 06/21/2021 1011   CHOLHDL 2.5 06/21/2021 1011   LDLCALC 74 06/21/2021 1011   LABVLDL 22 06/21/2021 1011      Lab Results  Component Value Date   TSH 2.450 02/28/2023   TSH 2.430 02/18/2021           Assessment & Plan:   1) Type 1 diabetes mellitus without complications (HCC)  She presents today with her Omnipod 5 Dexcom combo showing improving  glycemic profile.  Her POCT A1c today is 6.6%, improving from last visit of 8.5%.  She notes her OB put her on NPH 14 units at bedtime to help improve glycemic control and also adjusted her insulin to carb ratio.  Analysis of her CGM shows TIR 79%, TAR 21%, TBR 0% with a GMI of 6.8%.  She does note some severe hypoglycemia recently overnight in the 30s.  She notes she does perform fingersticks and it shows higher reading.  I did walk her through the process of properly calibrating the Dexcom so it is reading properly.  - Diamond Robbins has currently uncontrolled symptomatic type 2 DM since 33 years of age.   -Recent labs reviewed.    - I had a long discussion with her about the progressive nature of diabetes and the pathology behind its complications. -her diabetes is not currently complicated but she remains at a high risk for more acute and chronic complications which include CAD, CVA, CKD, retinopathy, and neuropathy. These are all discussed in detail with her.  The following Lifestyle Medicine recommendations according to American College of Lifestyle Medicine [facility]) were discussed and offered to patient and she agrees to start the journey:  A. Whole Foods, Plant-based plate comprising of fruits and vegetables, plant-based proteins, whole-grain carbohydrates was discussed in detail with the patient.   A list for source of those nutrients were also provided to the patient.  Patient will use only water or unsweetened tea for hydration. B.  The need to stay away from risky substances including alcohol, smoking; obtaining 7 to 9 hours of restorative sleep, at least 150 minutes of moderate intensity exercise weekly, the importance of healthy social connections,  and stress reduction techniques were discussed. C.  A full color page of  Calorie density of various food  groups per pound showing examples of each food groups was provided to the patient.  - Nutritional counseling repeated at each appointment  due to patients tendency to fall back in to old habits.  - The patient admits there is a room for improvement in their diet and drink choices. -  Suggestion is made for the patient to avoid simple carbohydrates from their diet including Cakes, Sweet Desserts / Pastries, Ice Cream, Soda (diet and regular), Sweet Tea, Candies, Chips, Cookies, Sweet Pastries, Store Bought Juices, Alcohol in Excess of 1-2 drinks a day, Artificial Sweeteners, Coffee Creamer, and "Sugar-free" Products. This will help patient to have stable blood glucose profile and potentially avoid unintended weight gain.   - I encouraged the patient to switch to unprocessed or minimally processed complex starch and increased protein intake (animal or plant source), fruits, and vegetables.   - Patient is advised to stick to a routine mealtimes to eat 3 meals a day and avoid unnecessary snacks (to snack only to correct hypoglycemia).  - she will be scheduled with Norm Salt, RDN, CDE for diabetes education.  - I have approached her with the following individualized plan to manage her diabetes and patient agrees:   -I did make several pump adjustments today.  I turned off her reverse correction, changed her insulin:carb ratio to 1:5 (instead of 1:7), and adjusted basal rate to 1.2 units per hour (vs 1 unit per hour).  I also am having her stop the NPH.  I will also send communication to OBGYN of this.  She is also encouraged to be more consistent with bolusing PRIOR to her meals, which she admits she has struggled to remember to do at time.  I am having her reach out with an update on glucose in 2 weeks so we can continue to tweak her settings to obtain/maintain optimal control.  -she is encouraged to continue monitoring glucose 4 times daily (using her CGM), before meals and before bed, and to call the clinic if she has readings less than 70 or above 300 for 3 tests in a row.    - she is warned not to take insulin without proper  monitoring per orders. - Adjustment parameters are given to her for hypo and hyperglycemia in writing.  -Given her type 1 diagnosis, insulin is the only treatment option for her.  - Specific targets for  A1c; LDL, HDL, and Triglycerides were discussed with the patient.  2) Blood Pressure /Hypertension:  her blood pressure is controlled to target.   she is advised to continue her current medications prescribed by her OBGYN.  3) Lipids/Hyperlipidemia:    Review of her recent lipid panel from 06/21/21 showed controlled LDL at 74.  she is advised to continue Crestor 10 mg daily at bedtime.  Side effects and precautions discussed with her.  Will recheck lipid panel prior to next visit.  4)  Weight/Diet:  her Body mass index is 33.83 kg/m.  -  clearly complicating her diabetes care.   she is a candidate for some weight loss. I discussed with her the fact that loss of 5 - 10% of her  current body weight will have the most impact on her diabetes management.  Exercise, and detailed carbohydrates information provided  -  detailed on discharge instructions.  5) Chronic Care/Health Maintenance: -she is not on ACEI/ARB and or Statin medications (due to pregnancy) and is encouraged to initiate and continue to follow up with Ophthalmology, Dentist, Podiatrist at least yearly or according  to recommendations, and advised to stay away from smoking. I have recommended yearly flu vaccine and pneumonia vaccine at least every 5 years; moderate intensity exercise for up to 150 minutes weekly; and sleep for at least 7 hours a day.  - she is advised to maintain close follow up with Gabriel Earing, FNP for primary care needs, as well as her other providers for optimal and coordinated care.     I spent  50  minutes in the care of the patient today including review of labs from CMP, Lipids, Thyroid Function, Hematology (current and previous including abstractions from other facilities); face-to-face time discussing   her blood glucose readings/logs, discussing hypoglycemia and hyperglycemia episodes and symptoms, medications doses, her options of short and long term treatment based on the latest standards of care / guidelines;  discussion about incorporating lifestyle medicine;  and documenting the encounter. Risk reduction counseling performed per USPSTF guidelines to reduce obesity and cardiovascular risk factors.     Please refer to Patient Instructions for Blood Glucose Monitoring and Insulin/Medications Dosing Guide"  in media tab for additional information. Please  also refer to " Patient Self Inventory" in the Media  tab for reviewed elements of pertinent patient history.  Diamond Robbins participated in the discussions, expressed understanding, and voiced agreement with the above plans.  All questions were answered to her satisfaction. she is encouraged to contact clinic should she have any questions or concerns prior to her return visit.     Follow up plan: - Return in about 3 months (around 09/13/2023), or call with update on glucose in 2 weeks, for Diabetes F/U with A1c in office, No previsit labs, Bring meter and logs.   Ronny Bacon, [facility] [address] [place], [place] [address] Phone: [phone] Fax: [phone]  06/16/2023, 10:08 AM

## 2023-06-17 DIAGNOSIS — Z419 Encounter for procedure for purposes other than remedying health state, unspecified: Secondary | ICD-10-CM | POA: Diagnosis not present

## 2023-06-19 ENCOUNTER — Ambulatory Visit: Payer: Medicaid Other

## 2023-06-19 ENCOUNTER — Encounter: Payer: Self-pay | Admitting: Obstetrics & Gynecology

## 2023-06-19 ENCOUNTER — Ambulatory Visit: Payer: Medicaid Other | Admitting: Obstetrics & Gynecology

## 2023-06-19 VITALS — BP 135/82 | HR 90 | Wt 194.0 lb

## 2023-06-19 DIAGNOSIS — O10912 Unspecified pre-existing hypertension complicating pregnancy, second trimester: Secondary | ICD-10-CM

## 2023-06-19 DIAGNOSIS — O0992 Supervision of high risk pregnancy, unspecified, second trimester: Secondary | ICD-10-CM | POA: Diagnosis not present

## 2023-06-19 DIAGNOSIS — O10919 Unspecified pre-existing hypertension complicating pregnancy, unspecified trimester: Secondary | ICD-10-CM

## 2023-06-19 DIAGNOSIS — O365931 Maternal care for other known or suspected poor fetal growth, third trimester, fetus 1: Secondary | ICD-10-CM | POA: Diagnosis not present

## 2023-06-19 DIAGNOSIS — O365990 Maternal care for other known or suspected poor fetal growth, unspecified trimester, not applicable or unspecified: Secondary | ICD-10-CM

## 2023-06-19 DIAGNOSIS — O365920 Maternal care for other known or suspected poor fetal growth, second trimester, not applicable or unspecified: Secondary | ICD-10-CM | POA: Diagnosis not present

## 2023-06-19 DIAGNOSIS — O24013 Pre-existing diabetes mellitus, type 1, in pregnancy, third trimester: Secondary | ICD-10-CM | POA: Diagnosis not present

## 2023-06-19 DIAGNOSIS — O24319 Unspecified pre-existing diabetes mellitus in pregnancy, unspecified trimester: Secondary | ICD-10-CM

## 2023-06-19 DIAGNOSIS — O24312 Unspecified pre-existing diabetes mellitus in pregnancy, second trimester: Secondary | ICD-10-CM

## 2023-06-19 DIAGNOSIS — Z3A29 29 weeks gestation of pregnancy: Secondary | ICD-10-CM

## 2023-06-19 DIAGNOSIS — C9001 Multiple myeloma in remission: Secondary | ICD-10-CM

## 2023-06-19 NOTE — Progress Notes (Signed)
HIGH-RISK PREGNANCY VISIT Patient name: Diamond Robbins MRN [id number]  Date of birth: 12/10/1990 Chief Complaint:   Routine Prenatal Visit  History of Present Illness:   Diamond Robbins is a 33 y.o. G26P0010 female at [redacted]w[redacted]d with an Estimated Date of Delivery: 09/04/23 being seen today for ongoing management of a high-risk pregnancy complicated by:  -FGR -Type 1 DM followed by endocrinology Recently had appointment with them and insulin levels were adjusted, discontinued NPH at night  -Chronic HTN -h/o multiple myeloma -SUA  Today she reports no complaints.   Contractions: Not present. Vag. Bleeding: None.  Movement: Present. denies leaking of fluid.      02/28/2023    9:39 AM 12/30/2022    9:23 AM 06/30/2022   11:24 AM 09/09/2021   10:53 AM 07/22/2021    9:44 AM  Depression screen PHQ 2/9  Decreased Interest 0 0 0 0 0  Down, Depressed, Hopeless 0 0 0 0 0  PHQ - 2 Score 0 0 0 0 0  Altered sleeping 0 0 0    Tired, decreased energy 0 0 0    Change in appetite 0 0 0    Feeling bad or failure about yourself  0 0 0    Trouble concentrating 0 0 0    Moving slowly or fidgety/restless 0 0 0    Suicidal thoughts 0 0 0    PHQ-9 Score 0 0 0    Difficult doing work/chores  Not difficult at all Not difficult at all       Current Outpatient Medications  Medication Instructions   ACCU-CHEK GUIDE test strip Use to check blood glucose 4 times daily.   Accu-Chek Softclix Lancets lancets Use to check blood glucose 4 times daily.   aspirin EC 162 mg, Oral, Daily, Swallow whole.   Blood Glucose Monitoring Suppl (ACCU-CHEK GUIDE) w/Device KIT No dose, route, or frequency recorded.   Blood Pressure Monitor KIT Use to check blood pressure as instructed   calcium carbonate (OS-CAL) 1250 (500 Ca) MG chewable tablet 1 tablet, Daily   cetirizine (ZYRTEC) 10 MG tablet Take 1 tablet by mouth once daily   cholecalciferol (VITAMIN D3) 1,000 Units, Daily   Continuous Glucose Sensor (DEXCOM G6 SENSOR)  MISC Change sensor every 10 days as directed   Continuous Glucose Transmitter (DEXCOM G6 TRANSMITTER) MISC Change transmitter every 90 days as directed.   ferrous sulfate 325 mg, Daily with breakfast   fluticasone (FLONASE) 50 MCG/ACT nasal spray 2 sprays, Each Nare, Daily   Insulin Disposable Pump (OMNIPOD 5 G6 INTRO, GEN 5,) KIT Change pod every 48-72 hours   Insulin Disposable Pump (OMNIPOD 5 G6 PODS, GEN 5,) MISC Change pod every 48-72 hours   insulin lispro (HUMALOG) 100 UNIT/ML injection Use with Omnipod for TDD around 70 units daily   Insulin Syringe-Needle U-100 (INSULIN SYRINGE .3CC/31GX5/16") 31G X 5/16" 0.3 ML MISC 100 each, Does not apply, Daily   labetalol (NORMODYNE) 100 mg, Oral, 2 times daily   NIFEdipine (PROCARDIA-XL/NIFEDICAL-XL) 30 mg, Oral, Daily   Prenatal Vit-Fe Fumarate-FA (PRENATAL VITAMIN PO) Take by mouth.     Review of Systems:   Pertinent items are noted in HPI Denies abnormal vaginal discharge w/ itching/odor/irritation, headaches, visual changes, shortness of breath, chest pain, abdominal pain, severe nausea/vomiting, or problems with urination or bowel movements unless otherwise stated above. Pertinent History Reviewed:  Reviewed past medical,surgical, social, obstetrical and family history.  Reviewed problem list, medications and allergies. Physical Assessment:   Vitals:   06/19/23  0913  BP: 135/82  Pulse: 90  Weight: 194 lb (88 kg)  Body mass index is 34.37 kg/m.           Physical Examination:   General appearance: alert, well appearing, and in no distress  Mental status: normal mood, behavior, speech, dress, motor activity, and thought processes  Skin: warm & dry   Extremities:      Cardiovascular: normal heart rate noted  Respiratory: normal respiratory effort, no distress  Abdomen: gravid, soft, non-tender  Pelvic: Cervical exam deferred         Fetal Status:     Movement: Present    Fetal Surveillance Testing today: breech,posterior  fundal placenta gr 0,normal ovaries,2VC,BPP 8/8,AFI 10.7 cm,FHR 137 bpm,elevated UAD with EDF,RI .77.75,.79=94%    Chaperone: N/A    No results found for this or any previous visit (from the past 24 hours).   Assessment & Plan:  High-risk pregnancy: G2P0010 at [redacted]w[redacted]d with an Estimated Date of Delivery: 09/04/23   1. Supervision of high risk pregnancy in second trimester (Primary) -discussed Tdap, patient notes that due to history of MFM there are some vaccinations which she is not supposed to get []  Patient will try to look into this before her next visit  2. Class B DM(type 1) with pump + Omnipod Management per endocrinology, plan to adjust every 2 weeks  3. Chronic hypertension: labetalol 100 BID Currently asymptomatic BP stable, continue current meds  4. Fetal growth restriction: severe less than 0.3% Dopplers starting to elevate -Continue weekly testing -Briefly discussed early delivery as well as possible hospitalization pending these studies  5. 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 [redacted] weeks gestation of pregnancy   Meds: No orders of the defined types were placed in this encounter.   Labs/procedures today: BPP/dopplers  Treatment Plan:  as outlined above  Reviewed: Preterm labor symptoms and general obstetric precautions including but not limited to vaginal bleeding, contractions, leaking of fluid and fetal movement were reviewed in detail with the patient.  All questions were answered. Pt has home bp cuff. Check bp weekly, let us know if >140/90.   Follow-up: Return for HROB visit and weekly visits as scheduled.   Future Appointments  Date Time Provider Department Center  06/26/2023  1:30 PM [facility] - FT IMG 2 [facility] None  06/26/2023  2:30 PM Myna Hidalgo, DO [facility] FTOBGYN  07/03/2023 10:45 AM [facility] - FT IMG 2 [facility] None  07/03/2023 11:10 AM Lazaro Arms, MD [facility]  07/10/2023  8:30 AM [facility] - [facility]TOBGYN [place] [facility] None  07/10/2023   9:30 AM Lazaro Arms, MD [facility]  07/13/2023 10:30 AM [facility]FT[facility]GY[facility] NURSE [facility]  07/17/2023  8:30 AM [facility] - FT IMG 2 [facility]G None  07/17/2023  9:30 AM Myna Hidalgo, DO [facility]  07/20/2023  9:50 AM [facility] NURSE [facility]  07/24/2023  8:30 AM [facility] - [facility]TO[facility]YN [place] [facility] None  07/24/2023  9:30 AM Lazaro Arms, MD [facility]  07/27/2023  9:30 AM [facility] NURSE [facility]  07/31/2023  8:30 AM [facility] - [facility]YN [place] [facility] None  07/31/2023  9:30 AM Myna Hidalgo, DO [facility]  08/03/2023  9:30 AM [facility] NURSE [facility]  08/07/2023  8:30 AM [facility] - FT IMG 2 [facility] None  08/07/2023  9:30 AM Myna Hidalgo, DO [facility]  08/10/2023  9:30 AM [facility] NURSE [facility]  08/14/2023  8:30 AM [facility] - [facility]TO[facility]YN [place] [facility] None  08/14/2023  9:30 AM Lazaro Arms, MD [facility]  08/17/2023  9:30 AM [facility] NURSE [facility]  08/21/2023  8:30 AM [facility] - FT IMG 2 [facility]FT[facility] None  08/24/2023  9:30 AM [facility] NURSE [facility]  08/28/2023  8:30 AM [facility] - FT IMG 2 [facility] None  08/31/2023  9:10 AM [facility]FT[facility] NURSE [facility]  09/15/2023  8:30 AM Dani Gobble, NP [facility] None  10/04/2023 10:45 AM [facility] LAB [facility] None  10/11/2023 11:45 AM Doreatha Massed, MD [facility] None    Orders Placed This Encounter  Procedures   US FETAL BPP WO NON STRESS   [place] UA Cord Doppler   US OB Follow Up    Myna Hidalgo, DO Attending Obstetrician & Gynecologist, [facility] [employer], [facility]

## 2023-06-19 NOTE — Progress Notes (Signed)
[place] 29 wks,breech,posterior fundal placenta gr 0,normal ovaries,2VC,BPP 8/8,AFI 10.7 cm,FHR 137 bpm,elevated UAD with EDF,RI .77.75,.79=94%

## 2023-06-20 ENCOUNTER — Other Ambulatory Visit: Payer: Self-pay | Admitting: Obstetrics & Gynecology

## 2023-06-20 DIAGNOSIS — O24319 Unspecified pre-existing diabetes mellitus in pregnancy, unspecified trimester: Secondary | ICD-10-CM

## 2023-06-20 DIAGNOSIS — O365990 Maternal care for other known or suspected poor fetal growth, unspecified trimester, not applicable or unspecified: Secondary | ICD-10-CM

## 2023-06-20 DIAGNOSIS — O0992 Supervision of high risk pregnancy, unspecified, second trimester: Secondary | ICD-10-CM

## 2023-06-20 DIAGNOSIS — O10919 Unspecified pre-existing hypertension complicating pregnancy, unspecified trimester: Secondary | ICD-10-CM

## 2023-06-20 DIAGNOSIS — O09899 Supervision of other high risk pregnancies, unspecified trimester: Secondary | ICD-10-CM

## 2023-06-26 ENCOUNTER — Ambulatory Visit: Payer: Medicaid Other | Admitting: Radiology

## 2023-06-26 ENCOUNTER — Encounter: Payer: Medicaid Other | Admitting: Obstetrics & Gynecology

## 2023-06-26 ENCOUNTER — Encounter: Payer: Self-pay | Admitting: Obstetrics & Gynecology

## 2023-06-26 ENCOUNTER — Ambulatory Visit (INDEPENDENT_AMBULATORY_CARE_PROVIDER_SITE_OTHER): Payer: Medicaid Other | Admitting: Obstetrics & Gynecology

## 2023-06-26 VITALS — BP 124/86 | HR 92 | Wt 193.0 lb

## 2023-06-26 DIAGNOSIS — O24319 Unspecified pre-existing diabetes mellitus in pregnancy, unspecified trimester: Secondary | ICD-10-CM

## 2023-06-26 DIAGNOSIS — O0993 Supervision of high risk pregnancy, unspecified, third trimester: Secondary | ICD-10-CM

## 2023-06-26 DIAGNOSIS — C9001 Multiple myeloma in remission: Secondary | ICD-10-CM

## 2023-06-26 DIAGNOSIS — O365930 Maternal care for other known or suspected poor fetal growth, third trimester, not applicable or unspecified: Secondary | ICD-10-CM

## 2023-06-26 DIAGNOSIS — O365990 Maternal care for other known or suspected poor fetal growth, unspecified trimester, not applicable or unspecified: Secondary | ICD-10-CM

## 2023-06-26 DIAGNOSIS — O09893 Supervision of other high risk pregnancies, third trimester: Secondary | ICD-10-CM

## 2023-06-26 DIAGNOSIS — O10919 Unspecified pre-existing hypertension complicating pregnancy, unspecified trimester: Secondary | ICD-10-CM

## 2023-06-26 DIAGNOSIS — O10913 Unspecified pre-existing hypertension complicating pregnancy, third trimester: Secondary | ICD-10-CM | POA: Diagnosis not present

## 2023-06-26 DIAGNOSIS — Z3A30 30 weeks gestation of pregnancy: Secondary | ICD-10-CM

## 2023-06-26 DIAGNOSIS — O24313 Unspecified pre-existing diabetes mellitus in pregnancy, third trimester: Secondary | ICD-10-CM

## 2023-06-26 DIAGNOSIS — O09899 Supervision of other high risk pregnancies, unspecified trimester: Secondary | ICD-10-CM

## 2023-06-26 DIAGNOSIS — O0992 Supervision of high risk pregnancy, unspecified, second trimester: Secondary | ICD-10-CM

## 2023-06-26 NOTE — Progress Notes (Signed)
 HIGH-RISK PREGNANCY VISIT Patient name: Diamond Robbins MRN [id number]  Date of birth: 12/17/1990 Chief Complaint:   Routine Prenatal Visit  History of Present Illness:   Diamond Robbins is a 33 y.o. G68P0010 female at [redacted]w[redacted]d with an Estimated Date of Delivery: 09/04/23 being seen today for ongoing management of a high-risk pregnancy complicated by:  -Multiple myeoma in remission -Chronic HTN- on Procardia /Labetalol  -FGR -Type 1 DM Sugars improving, still some elevated after meals   Today she reports no complaints.   Contractions: Not present. Vag. Bleeding: None.  Movement: Present. denies leaking of fluid.      02/28/2023    9:39 AM 12/30/2022    9:23 AM 06/30/2022   11:24 AM 09/09/2021   10:53 AM 07/22/2021    9:44 AM  Depression screen PHQ 2/9  Decreased Interest 0 0 0 0 0  Down, Depressed, Hopeless 0 0 0 0 0  PHQ - 2 Score 0 0 0 0 0  Altered sleeping 0 0 0    Tired, decreased energy 0 0 0    Change in appetite 0 0 0    Feeling bad or failure about yourself  0 0 0    Trouble concentrating 0 0 0    Moving slowly or fidgety/restless 0 0 0    Suicidal thoughts 0 0 0    PHQ-9 Score 0 0 0    Difficult doing work/chores  Not difficult at all Not difficult at all       Current Outpatient Medications  Medication Instructions   ACCU-CHEK GUIDE test strip Use to check blood glucose 4 times daily.   Accu-Chek Softclix Lancets lancets Use to check blood glucose 4 times daily.   aspirin  EC 162 mg, Oral, Daily, Swallow whole.   Blood Glucose Monitoring Suppl (ACCU-CHEK GUIDE) w/Device KIT No dose, route, or frequency recorded.   Blood Pressure Monitor KIT Use to check blood pressure as instructed   calcium  carbonate (OS-CAL) 1250 (500 Ca) MG chewable tablet 1 tablet, Daily   cetirizine  (ZYRTEC ) 10 MG tablet Take 1 tablet by mouth once daily   cholecalciferol  (VITAMIN D3) 1,000 Units, Daily   Continuous Glucose Sensor (DEXCOM G6 SENSOR) MISC Change sensor every 10 days as directed    Continuous Glucose Transmitter (DEXCOM G6 TRANSMITTER) MISC Change transmitter every 90 days as directed.   ferrous sulfate  325 mg, Daily with breakfast   fluticasone  (FLONASE ) 50 MCG/ACT nasal spray 2 sprays, Each Nare, Daily   Insulin  Disposable Pump (OMNIPOD 5 G6 INTRO, GEN 5,) KIT Change pod every 48-72 hours   Insulin  Disposable Pump (OMNIPOD 5 G6 PODS, GEN 5,) MISC Change pod every 48-72 hours   insulin  lispro (HUMALOG ) 100 UNIT/ML injection Use with Omnipod for TDD around 70 units daily   Insulin  Syringe-Needle U-100 (INSULIN  SYRINGE .3CC/31GX5/16") 31G X 5/16" 0.3 ML MISC 100 each, Does not apply, Daily   labetalol  (NORMODYNE ) 100 mg, Oral, 2 times daily   NIFEdipine  (PROCARDIA -XL/NIFEDICAL-XL) 30 mg, Oral, Daily   Prenatal Vit-Fe Fumarate-FA (PRENATAL VITAMIN PO) Take by mouth.     Review of Systems:   Pertinent items are noted in HPI Denies abnormal vaginal discharge w/ itching/odor/irritation, headaches, visual changes, shortness of breath, chest pain, abdominal pain, severe nausea/vomiting, or problems with urination or bowel movements unless otherwise stated above. Pertinent History Reviewed:  Reviewed past medical,surgical, social, obstetrical and family history.  Reviewed problem list, medications and allergies. Physical Assessment:   Vitals:   06/26/23 1358  BP: 124/86  Pulse: 92  Weight: 193 lb (87.5 kg)  Body mass index is 34.19 kg/m.           Physical Examination:   General appearance: alert, well appearing, and in no distress  Mental status: normal mood, behavior, speech, dress, motor activity, and thought processes  Skin: warm & dry   Extremities: Edema: None    Cardiovascular: normal heart rate noted  Respiratory: normal respiratory effort, no distress  Abdomen: gravid, soft, non-tender  Pelvic: Cervical exam deferred         Fetal Status:     Movement: Present    Fetal Surveillance Testing today: Single viable active female fetus, cephalic, FHR = 139  bpm AFI = 12.65 cm, 29%, MVP = 5.2 cm  BPP = 8/8  RI: 0.64, 0.62  43%   Chaperone: N/A    No results found for this or any previous visit (from the past 24 hours).   Assessment & Plan:  High-risk pregnancy: G2P0010 at [redacted]w[redacted]d with an Estimated Date of Delivery: 09/04/23   1. Supervision of high risk pregnancy in second trimester (Primary)  2. Class B DM(type 1) with pump + Omnipod -sugars improved, reviewed bolus s/p meals when needed  3. Chronic hypertension: labetalol  100 BID asymptomatic  4. Fetal growth restriction: severe less than 0.3% -BPP 8/8, normal dopplers today -continue weekly testing and twice weekly to start @ 32wks  5. Multiple myeloma in remission (HCC)   Meds: No orders of the defined types were placed in this encounter.   Labs/procedures today: BPP/doppler  Treatment Plan:  Tdap next visit- cleared by oncology today  Reviewed: Preterm labor symptoms and general obstetric precautions including but not limited to vaginal bleeding, contractions, leaking of fluid and fetal movement were reviewed in detail with the patient.  All questions were answered. Pt has home bp cuff. Check bp weekly, let us  know if >140/90.   Follow-up: Return for twice weekly as scheduled.   Future Appointments  Date Time Provider Department Center  06/26/2023  2:30 PM [place], [place] [facility] FTO[facility]YN  07/03/2023 10:45 AM [facility] - FT IMG 2 [facility]FT[facility] None  07/03/2023 11:10 AM Wendelyn Halter, MD [facility]  07/10/2023  8:30 AM [facility] - FTOBGYN US  [facility]FTIMG None  07/10/2023  9:30 AM Wendelyn Halter, MD [facility]  07/13/2023 10:30 AM [facility]FT[facility]GY[facility] NURSE [facility]  07/17/2023  8:30 AM [facility] - FT IMG 2 [facility] None  07/17/2023  9:30 AM Saylee Sherrill, DO [facility]  07/20/2023  9:50 AM [facility]FT[facility]GY[facility] NURSE [facility]  07/24/2023  9:15 AM [facility] - FT IMG 2 [facility]FT[facility] None  07/24/2023 10:10 AM Wendelyn Halter, MD [facility]  07/27/2023  9:30 AM [facility] NURSE [facility]  07/31/2023  9:15  AM [facility] - FT IMG 2 [facility]FT[facility] None  07/31/2023 10:10 AM [place]ene [place], DO [facility]  08/03/2023  9:30 AM [facility] NURSE [facility]  08/07/2023  8:30 AM [facility] - FT IMG 2 [facility] None  08/07/2023  9:30 AM [place], DO [facility]TO[facility]  08/10/2023  9:30 AM [facility]FT[facility] NURSE [facility]  08/14/2023  8:30 AM [facility] - FTOBGYN US  [facility]FTIMG None  08/14/2023  9:30 AM Wendelyn Halter, MD [facility]  08/17/2023  9:30 AM [facility] NURSE [facility]  08/21/2023  8:30 AM [facility] - FT IMG 2 [facility]FT[facility] None  08/21/2023  9:50 AM Wendelyn Halter, MD [facility]  08/24/2023  9:30 AM [facility] NURSE [facility]  08/28/2023  8:30 AM [facility] - FT IMG 2 [facility]FT[facility]  None  08/28/2023  9:30 AM Wendelyn Halter, MD [facility]  08/31/2023  9:10 AM [facility]FT[facility]GY[facility] NURSE [facility]  09/15/2023  8:30 AM Wendel Hals, NP REA-REA None  10/04/2023 10:45 AM [facility] LAB [facility] None  10/11/2023 11:45 AM Paulett Boros, MD [facility] None    No orders of the defined types were placed in this encounter.   Toyoko Silos, DO Attending Obstetrician & Gynecologist, [facility] for [employer], [facility]

## 2023-06-26 NOTE — Progress Notes (Signed)
 US :  GA = 30 weeks Single viable active female fetus, cephalic, FHR = 139 bpm AFI = 21.30 cm, 29%, MVP = 5.2 cm  BPP = 8/8  RI: 0.64, 0.62  43% Cervix long, closed,  neg adnexal regions

## 2023-06-27 ENCOUNTER — Other Ambulatory Visit: Payer: Self-pay | Admitting: Obstetrics & Gynecology

## 2023-06-27 DIAGNOSIS — O365990 Maternal care for other known or suspected poor fetal growth, unspecified trimester, not applicable or unspecified: Secondary | ICD-10-CM

## 2023-06-27 DIAGNOSIS — O0992 Supervision of high risk pregnancy, unspecified, second trimester: Secondary | ICD-10-CM

## 2023-06-27 DIAGNOSIS — O24319 Unspecified pre-existing diabetes mellitus in pregnancy, unspecified trimester: Secondary | ICD-10-CM

## 2023-06-28 DIAGNOSIS — Z419 Encounter for procedure for purposes other than remedying health state, unspecified: Secondary | ICD-10-CM | POA: Diagnosis not present

## 2023-06-30 ENCOUNTER — Other Ambulatory Visit: Payer: Medicaid Other

## 2023-06-30 ENCOUNTER — Ambulatory Visit: Payer: No Typology Code available for payment source

## 2023-07-03 ENCOUNTER — Ambulatory Visit: Payer: Medicaid Other | Admitting: Obstetrics & Gynecology

## 2023-07-03 ENCOUNTER — Ambulatory Visit: Payer: Medicaid Other | Admitting: Radiology

## 2023-07-03 ENCOUNTER — Encounter: Payer: Self-pay | Admitting: Nurse Practitioner

## 2023-07-03 ENCOUNTER — Encounter: Payer: Self-pay | Admitting: Obstetrics & Gynecology

## 2023-07-03 VITALS — BP 117/83 | HR 102 | Wt 194.0 lb

## 2023-07-03 DIAGNOSIS — O24313 Unspecified pre-existing diabetes mellitus in pregnancy, third trimester: Secondary | ICD-10-CM | POA: Diagnosis not present

## 2023-07-03 DIAGNOSIS — O365990 Maternal care for other known or suspected poor fetal growth, unspecified trimester, not applicable or unspecified: Secondary | ICD-10-CM

## 2023-07-03 DIAGNOSIS — O365930 Maternal care for other known or suspected poor fetal growth, third trimester, not applicable or unspecified: Secondary | ICD-10-CM | POA: Diagnosis not present

## 2023-07-03 DIAGNOSIS — O24319 Unspecified pre-existing diabetes mellitus in pregnancy, unspecified trimester: Secondary | ICD-10-CM

## 2023-07-03 DIAGNOSIS — O10913 Unspecified pre-existing hypertension complicating pregnancy, third trimester: Secondary | ICD-10-CM

## 2023-07-03 DIAGNOSIS — O0992 Supervision of high risk pregnancy, unspecified, second trimester: Secondary | ICD-10-CM

## 2023-07-03 DIAGNOSIS — Z3A31 31 weeks gestation of pregnancy: Secondary | ICD-10-CM

## 2023-07-03 DIAGNOSIS — O0993 Supervision of high risk pregnancy, unspecified, third trimester: Secondary | ICD-10-CM | POA: Diagnosis not present

## 2023-07-03 DIAGNOSIS — O10919 Unspecified pre-existing hypertension complicating pregnancy, unspecified trimester: Secondary | ICD-10-CM

## 2023-07-03 NOTE — Progress Notes (Signed)
 HIGH-RISK PREGNANCY VISIT Patient name: Diamond Robbins MRN [id number]  Date of birth: 17-Aug-1990 Chief Complaint:   Routine Prenatal Visit  History of Present Illness:   Diamond Robbins is a 33 y.o. G42P0010 female at [redacted]w[redacted]d with an Estimated Date of Delivery: 09/04/23 being seen today for ongoing management of a high-risk pregnancy complicated by .      ICD-10-CM   1. Supervision of high risk pregnancy in second trimester  O09.92     2. [redacted] weeks gestation of pregnancy  Z3A.31     3. Class B DM(type 1) with pump + Omnipod  O24.319     4. Fetal growth restriction: severe less than 0.3%: UAD normal today  O36.5990     5. Chronic hypertension: labetalol 100 BID  O10.919      Good CBG control and good BP control  Today she reports no complaints. Contractions: Not present. Vag. Bleeding: None.  Movement: Present. denies leaking of fluid.      02/28/2023    9:39 AM 12/30/2022    9:23 AM 06/30/2022   11:24 AM 09/09/2021   10:53 AM 07/22/2021    9:44 AM  Depression screen PHQ 2/9  Decreased Interest 0 0 0 0 0  Down, Depressed, Hopeless 0 0 0 0 0  PHQ - 2 Score 0 0 0 0 0  Altered sleeping 0 0 0    Tired, decreased energy 0 0 0    Change in appetite 0 0 0    Feeling bad or failure about yourself  0 0 0    Trouble concentrating 0 0 0    Moving slowly or fidgety/restless 0 0 0    Suicidal thoughts 0 0 0    PHQ-9 Score 0 0 0    Difficult doing work/chores  Not difficult at all Not difficult at all          02/28/2023    9:39 AM 12/30/2022    9:22 AM 06/30/2022   11:24 AM  GAD 7 : Generalized Anxiety Score  Nervous, Anxious, on Edge 0 0 0  Control/stop worrying 0 0 0  Worry too much - different things 0 0 0  Trouble relaxing 0 0 0  Restless 0 0 0  Easily annoyed or irritable 0 0 0  Afraid - awful might happen 0 0 0  Total GAD 7 Score 0 0 0  Anxiety Difficulty  Not difficult at all Not difficult at all     Review of Systems:   Pertinent items are noted in HPI Denies  abnormal vaginal discharge w/ itching/odor/irritation, headaches, visual changes, shortness of breath, chest pain, abdominal pain, severe nausea/vomiting, or problems with urination or bowel movements unless otherwise stated above. Pertinent History Reviewed:  Reviewed past medical,surgical, social, obstetrical and family history.  Reviewed problem list, medications and allergies. Physical Assessment:   Vitals:   07/03/23 1126 07/03/23 1132  BP:  117/83  Pulse:  (!) 102  Weight: 194 lb 6.4 oz (88.2 kg) 194 lb (88 kg)  Body mass index is 34.37 kg/m.           Physical Examination:   General appearance: alert, well appearing, and in no distress  Mental status: alert, oriented to person, place, and time  Skin: warm & dry   Extremities: Edema: None    Cardiovascular: normal heart rate noted  Respiratory: normal respiratory effort, no distress  Abdomen: gravid, soft, non-tender  Pelvic: Cervical exam deferred  Fetal Status:     Movement: Present    Fetal Surveillance Testing today: BPP 8/8 with normal UAD   Chaperone:     No results found for this or any previous visit (from the past 24 hours).  Assessment & Plan:  High-risk pregnancy: G2P0010 at [redacted]w[redacted]d with an Estimated Date of Delivery: 09/04/23      ICD-10-CM   1. Supervision of high risk pregnancy in second trimester  O09.92     2. [redacted] weeks gestation of pregnancy  Z3A.31     3. Class B DM(type 1) with pump + Omnipod  O24.319     4. Fetal growth restriction: severe less than 0.3%: UAD normal today  O36.5990     5. Chronic hypertension: labetalol 100 BID  O10.919         Meds: No orders of the defined types were placed in this encounter.   Orders: No orders of the defined types were placed in this encounter.    Labs/procedures today: U/S  Treatment Plan:  twice weekly surveillance  Reviewed: Check BP 1-2 times per day  Follow-up: No follow-ups on file.   Future Appointments  Date Time Provider  Department Center  07/10/2023  8:30 AM [facility] - FTOBGYN [place] [facility]FT[facility]G None  07/10/2023  9:30 AM Lazaro Arms, MD [facility]FT FTOBGYN  07/13/2023 10:30 AM [facility]FTOBGYN NURSE [facility]  07/17/2023  8:30 AM [facility] - FT IMG 2 [facility] None  07/17/2023  9:30 AM Myna Hidalgo, DO [facility] FTO[facility]YN  07/20/2023  9:50 AM [facility]FT[facility]GYN NURSE [facility]  07/24/2023  9:15 AM [facility] - FT IMG 2 [facility] None  07/24/2023 10:10 AM Lazaro Arms, MD [facility] F[facility]YN  07/27/2023  9:30 AM [facility]FT[facility]GYN NURSE [facility]  07/31/2023  9:15 AM [facility] - FT IMG 2 [facility]FT[facility] None  07/31/2023 10:10 AM Myna Hidalgo, DO [facility]  08/03/2023  9:30 AM [facility]GY[facility] NURSE [facility]  08/07/2023  8:30 AM [facility] - FT IMG 2 [facility] None  08/07/2023  9:30 AM Myna Hidalgo, DO [facility] FTO[facility]  08/10/2023  9:30 AM [facility]FT[facility]GY[facility] NURSE [facility]TO[facility]  08/14/2023  8:30 AM [facility] - FTOBGYN [place] [facility] None  08/14/2023  9:30 AM Lazaro Arms, MD [facility]  08/17/2023  9:30 AM [facility]GY[facility] NURSE [facility]  08/21/2023  8:30 AM [facility] - FT IMG 2 [facility]FT[facility] None  08/21/2023  9:50 AM Lazaro Arms, MD [facility] FTO[facility]  08/24/2023  9:30 AM [facility]FT[facility]GY[facility] NURSE [facility]YN  08/28/2023  8:30 AM [facility] - FT IMG 2 [facility]FT[facility] None  08/28/2023  9:30 AM Lazaro Arms, MD [facility] FTO[facility]YN  08/31/2023  9:10 AM [facility] NURSE [facility]  09/15/2023  8:30 AM Dani Gobble, NP REA-REA None  10/04/2023 10:45 AM [facility] LAB [facility] None  10/11/2023 11:45 AM Doreatha Massed, MD [facility] None    No orders of the defined types were placed in this encounter.  Lazaro Arms  Attending Physician for the Center for [facility] 07/10/2023 7:09 AM

## 2023-07-03 NOTE — Progress Notes (Signed)
[place]:  GA = 31 weeks Single active female fetus,  Homero Fellers Breech, FHR = 150 bpm AFI = 12.7 cm,  31%  MVP = 4.2 cm  EFW 0.3%  AC <1%  2VC Cord insertion seen near superior pl margin  BPP = 8/8  RI: 0.64, 0.64 52%   nl ov's

## 2023-07-07 ENCOUNTER — Ambulatory Visit: Payer: Medicaid Other

## 2023-07-07 ENCOUNTER — Other Ambulatory Visit: Payer: Medicaid Other

## 2023-07-10 ENCOUNTER — Encounter: Payer: Self-pay | Admitting: Obstetrics & Gynecology

## 2023-07-10 ENCOUNTER — Ambulatory Visit: Payer: Medicaid Other

## 2023-07-10 ENCOUNTER — Encounter: Payer: Self-pay | Admitting: Nurse Practitioner

## 2023-07-10 ENCOUNTER — Ambulatory Visit: Payer: Medicaid Other | Admitting: Obstetrics & Gynecology

## 2023-07-10 VITALS — BP 125/89 | HR 101 | Wt 195.0 lb

## 2023-07-10 DIAGNOSIS — O365930 Maternal care for other known or suspected poor fetal growth, third trimester, not applicable or unspecified: Secondary | ICD-10-CM

## 2023-07-10 DIAGNOSIS — O0992 Supervision of high risk pregnancy, unspecified, second trimester: Secondary | ICD-10-CM | POA: Diagnosis not present

## 2023-07-10 DIAGNOSIS — Z3A32 32 weeks gestation of pregnancy: Secondary | ICD-10-CM

## 2023-07-10 DIAGNOSIS — O24313 Unspecified pre-existing diabetes mellitus in pregnancy, third trimester: Secondary | ICD-10-CM | POA: Diagnosis not present

## 2023-07-10 DIAGNOSIS — O0993 Supervision of high risk pregnancy, unspecified, third trimester: Secondary | ICD-10-CM

## 2023-07-10 DIAGNOSIS — O24319 Unspecified pre-existing diabetes mellitus in pregnancy, unspecified trimester: Secondary | ICD-10-CM

## 2023-07-10 DIAGNOSIS — O365990 Maternal care for other known or suspected poor fetal growth, unspecified trimester, not applicable or unspecified: Secondary | ICD-10-CM

## 2023-07-10 DIAGNOSIS — O10913 Unspecified pre-existing hypertension complicating pregnancy, third trimester: Secondary | ICD-10-CM

## 2023-07-10 DIAGNOSIS — O10919 Unspecified pre-existing hypertension complicating pregnancy, unspecified trimester: Secondary | ICD-10-CM

## 2023-07-10 NOTE — Progress Notes (Signed)
 [place] 32 wks,frank breech,posterior fundal placenta gr 0,2VC,FHR 144 bpm,BPP 8/8,AFI 13 cm,RI .71,.71,.71,.65=83%

## 2023-07-10 NOTE — Progress Notes (Signed)
 HIGH-RISK PREGNANCY VISIT Patient name: Diamond Robbins MRN [id number]  Date of birth: 10/29/1990 Chief Complaint:   Routine Prenatal Visit  History of Present Illness:   Diamond Robbins is a 33 y.o. G74P0010 female at [redacted]w[redacted]d with an Estimated Date of Delivery: 09/04/23 being seen today for ongoing management of a high-risk pregnancy complicated by     ICD-10-CM   1. Supervision of high risk pregnancy in second trimester  O09.92     2. Class B DM(type 1) with pump + Omnipod:^10p-8a basal 1.4 u/hr  O24.319    keep all other settings the same, 1.2 basal, 1:5 sens + 1u/10 >125    3. Fetal growth restriction: severe less than 0.3%: UAD normal today  O36.5990     4. Chronic hypertension: labetalol ^ 200 BID + procardia xl 30 qd  O10.919      .    Today she reports no complaints. Contractions: Not present. Vag. Bleeding: None.  Movement: Present. denies leaking of fluid.      02/28/2023    9:39 AM 12/30/2022    9:23 AM 06/30/2022   11:24 AM 09/09/2021   10:53 AM 07/22/2021    9:44 AM  Depression screen PHQ 2/9  Decreased Interest 0 0 0 0 0  Down, Depressed, Hopeless 0 0 0 0 0  PHQ - 2 Score 0 0 0 0 0  Altered sleeping 0 0 0    Tired, decreased energy 0 0 0    Change in appetite 0 0 0    Feeling bad or failure about yourself  0 0 0    Trouble concentrating 0 0 0    Moving slowly or fidgety/restless 0 0 0    Suicidal thoughts 0 0 0    PHQ-9 Score 0 0 0    Difficult doing work/chores  Not difficult at all Not difficult at all          02/28/2023    9:39 AM 12/30/2022    9:22 AM 06/30/2022   11:24 AM  GAD 7 : Generalized Anxiety Score  Nervous, Anxious, on Edge 0 0 0  Control/stop worrying 0 0 0  Worry too much - different things 0 0 0  Trouble relaxing 0 0 0  Restless 0 0 0  Easily annoyed or irritable 0 0 0  Afraid - awful might happen 0 0 0  Total GAD 7 Score 0 0 0  Anxiety Difficulty  Not difficult at all Not difficult at all     Review of Systems:   Pertinent items  are noted in HPI Denies abnormal vaginal discharge w/ itching/odor/irritation, headaches, visual changes, shortness of breath, chest pain, abdominal pain, severe nausea/vomiting, or problems with urination or bowel movements unless otherwise stated above. Pertinent History Reviewed:  Reviewed past medical,surgical, social, obstetrical and family history.  Reviewed problem list, medications and allergies. Physical Assessment:   Vitals:   07/10/23 0834 07/10/23 0836  BP: (!) 123/90 125/89  Pulse: (!) 101   Weight: 195 lb (88.5 kg)   Body mass index is 34.54 kg/m.           Physical Examination:   General appearance: alert, well appearing, and in no distress  Mental status: alert, oriented to person, place, and time  Skin: warm & dry   Extremities: Edema: None    Cardiovascular: normal heart rate noted  Respiratory: normal respiratory effort, no distress  Abdomen: gravid, soft, non-tender  Pelvic: Cervical exam deferred  Fetal Status:     Movement: Present    Fetal Surveillance Testing today: BPP 8/8 with 83% UAD   Chaperone:     No results found for this or any previous visit (from the past 24 hours).  Assessment & Plan:  High-risk pregnancy: G2P0010 at [redacted]w[redacted]d with an Estimated Date of Delivery: 09/04/23      ICD-10-CM   1. Supervision of high risk pregnancy in second trimester  O09.92     2. Class B DM(type 1) with pump + Omnipod:^10p-8a basal 1.4 u/hr  O24.319    keep all other settings the same, 1.2 basal, 1:5 sens + 1u/10 >125    3. Fetal growth restriction: severe less than 0.3%: UAD normal today  O36.5990     4. Chronic hypertension: labetalol ^ 200 BID + procardia xl 30 qd  O10.919         Meds: No orders of the defined types were placed in this encounter.   Orders: No orders of the defined types were placed in this encounter.    Labs/procedures today: U/S  Treatment Plan:  see schedule below    Follow-up: No follow-ups on file.   Future  Appointments  Date Time Provider Department Center  07/13/2023 10:30 AM [facility] NURSE [facility]  07/17/2023  8:30 AM [facility] 2 [facility] None  07/17/2023  9:30 AM Myna Hidalgo, DO [facility]  07/20/2023  9:50 AM [facility] NURSE [facility]  07/24/2023  9:15 AM [facility] 2 [facility] None  07/24/2023 10:10 AM Lazaro Arms, MD [facility]  07/27/2023  9:30 AM [facility] NURSE [facility]  07/31/2023  9:15 AM [facility] 2 [facility] None  07/31/2023 10:10 AM Myna Hidalgo, DO [facility]  08/03/2023  9:30 AM [facility] NURSE [facility]  08/07/2023  8:30 AM [facility] 2 [facility] None  08/07/2023  9:30 AM Myna Hidalgo, DO [facility]  08/10/2023  9:30 AM [facility] NURSE [facility]  08/14/2023  8:30 AM [facility] [place] [facility] None  08/14/2023  9:30 AM Lazaro Arms, MD [facility]  08/17/2023  9:30 AM [facility] NURSE [facility]  08/21/2023  8:30 AM [facility]G 2 [facility] None  08/21/2023  9:50 AM Lazaro Arms, MD [facility]  08/24/2023  9:30 AM [facility] NURSE [facility]  08/28/2023  8:30 AM [facility]G 2 [facility] None  08/28/2023  9:30 AM Lazaro Arms, MD [facility]  08/31/2023  9:10 AM [facility] NURSE [facility]  09/15/2023  8:30 AM Dani Gobble, NP [facility] None  10/04/2023 10:45 AM [facility] LAB [facility] None  10/11/2023 11:45 AM Doreatha Massed, MD [facility] None    No orders of the defined types were placed in this encounter.  Lazaro Arms  Attending Physician for the Center for [facility] 07/10/2023 9:54 AM

## 2023-07-12 ENCOUNTER — Other Ambulatory Visit: Payer: Self-pay | Admitting: Obstetrics & Gynecology

## 2023-07-12 DIAGNOSIS — O24319 Unspecified pre-existing diabetes mellitus in pregnancy, unspecified trimester: Secondary | ICD-10-CM

## 2023-07-12 DIAGNOSIS — O0992 Supervision of high risk pregnancy, unspecified, second trimester: Secondary | ICD-10-CM

## 2023-07-12 DIAGNOSIS — O365990 Maternal care for other known or suspected poor fetal growth, unspecified trimester, not applicable or unspecified: Secondary | ICD-10-CM

## 2023-07-13 ENCOUNTER — Ambulatory Visit: Payer: Medicaid Other | Admitting: *Deleted

## 2023-07-13 VITALS — BP 123/84 | HR 98

## 2023-07-13 DIAGNOSIS — Z3A32 32 weeks gestation of pregnancy: Secondary | ICD-10-CM

## 2023-07-13 DIAGNOSIS — E1065 Type 1 diabetes mellitus with hyperglycemia: Secondary | ICD-10-CM

## 2023-07-13 DIAGNOSIS — O10913 Unspecified pre-existing hypertension complicating pregnancy, third trimester: Secondary | ICD-10-CM

## 2023-07-13 DIAGNOSIS — O10919 Unspecified pre-existing hypertension complicating pregnancy, unspecified trimester: Secondary | ICD-10-CM

## 2023-07-13 DIAGNOSIS — O0993 Supervision of high risk pregnancy, unspecified, third trimester: Secondary | ICD-10-CM | POA: Diagnosis not present

## 2023-07-13 NOTE — Progress Notes (Signed)
   NURSE VISIT- NST  SUBJECTIVE:  Diamond Robbins is a 33 y.o. G2P0010 female at [redacted]w[redacted]d, here for a NST for pregnancy complicated by [facility] and Diabetes: T1DM}.  She reports active fetal movement, contractions: none, vaginal bleeding: none, membranes: intact.   OBJECTIVE:  BP 123/84   Pulse 98   LMP 11/28/2022   Appears well, no apparent distress  No results found for this or any previous visit (from the past 24 hours).  NST: FHR baseline 130 bpm, Variability: moderate, Accelerations:present, Decelerations:  Absent= Cat 1/reactive Toco: none   ASSESSMENT: G2P0010 at [redacted]w[redacted]d with CHTN and Diabetes: T1DM} NST reactive  PLAN: EFM strip reviewed by Dr. Despina Hidden   Recommendations: keep next appointment as scheduled    Jobe Marker  07/13/2023 12:03 PM

## 2023-07-14 ENCOUNTER — Ambulatory Visit: Payer: Medicaid Other

## 2023-07-14 ENCOUNTER — Other Ambulatory Visit: Payer: Medicaid Other

## 2023-07-15 DIAGNOSIS — Z419 Encounter for procedure for purposes other than remedying health state, unspecified: Secondary | ICD-10-CM | POA: Diagnosis not present

## 2023-07-17 ENCOUNTER — Encounter: Payer: Self-pay | Admitting: Obstetrics & Gynecology

## 2023-07-17 ENCOUNTER — Ambulatory Visit: Payer: Medicaid Other | Admitting: Obstetrics & Gynecology

## 2023-07-17 ENCOUNTER — Ambulatory Visit: Payer: Medicaid Other | Admitting: Radiology

## 2023-07-17 VITALS — BP 121/83 | HR 89 | Wt 195.6 lb

## 2023-07-17 DIAGNOSIS — O10919 Unspecified pre-existing hypertension complicating pregnancy, unspecified trimester: Secondary | ICD-10-CM

## 2023-07-17 DIAGNOSIS — O24319 Unspecified pre-existing diabetes mellitus in pregnancy, unspecified trimester: Secondary | ICD-10-CM

## 2023-07-17 DIAGNOSIS — O24313 Unspecified pre-existing diabetes mellitus in pregnancy, third trimester: Secondary | ICD-10-CM

## 2023-07-17 DIAGNOSIS — E1065 Type 1 diabetes mellitus with hyperglycemia: Secondary | ICD-10-CM | POA: Diagnosis not present

## 2023-07-17 DIAGNOSIS — C9001 Multiple myeloma in remission: Secondary | ICD-10-CM | POA: Diagnosis not present

## 2023-07-17 DIAGNOSIS — O365990 Maternal care for other known or suspected poor fetal growth, unspecified trimester, not applicable or unspecified: Secondary | ICD-10-CM

## 2023-07-17 DIAGNOSIS — Z3A33 33 weeks gestation of pregnancy: Secondary | ICD-10-CM

## 2023-07-17 DIAGNOSIS — O365930 Maternal care for other known or suspected poor fetal growth, third trimester, not applicable or unspecified: Secondary | ICD-10-CM

## 2023-07-17 DIAGNOSIS — Z23 Encounter for immunization: Secondary | ICD-10-CM

## 2023-07-17 DIAGNOSIS — O0993 Supervision of high risk pregnancy, unspecified, third trimester: Secondary | ICD-10-CM

## 2023-07-17 DIAGNOSIS — O0992 Supervision of high risk pregnancy, unspecified, second trimester: Secondary | ICD-10-CM

## 2023-07-17 DIAGNOSIS — O10913 Unspecified pre-existing hypertension complicating pregnancy, third trimester: Secondary | ICD-10-CM

## 2023-07-17 NOTE — Progress Notes (Signed)
 [place]:  GA = 33 weeks Single active fetus, complete breech, FHR = 139 bpm  Fundal Left lateral posterior placenta Eccentric cord insertion at left fundal margin ~ 3-4 cm from margin AFI = 11.9 cm, 25%, MVP = 3.25 cm  BPP = 8/8,   RI: 0.70, 0.65 86%  good EDF

## 2023-07-17 NOTE — Progress Notes (Signed)
 HIGH-RISK PREGNANCY VISIT Patient name: Diamond Robbins MRN [id number]  Date of birth: [birth year] Chief Complaint:   Routine Prenatal Visit  History of Present Illness:   Diamond Robbins is a 33 y.o. G69P0010 female at [redacted]w[redacted]d with an Estimated Date of Delivery: 09/04/23 being seen today for ongoing management of a high-risk pregnancy complicated by:  -FGR -Chronic HTN -Type 1 DM- followed by Endo, recently seen and omnipod changed Sugars improved -h/o multiple myeloma -SUA   Today she reports no complaints.   Contractions: Not present. Vag. Bleeding: None.  Movement: Present. denies leaking of fluid.      02/28/2023    9:39 AM 12/30/2022    9:23 AM 06/30/2022   11:24 AM 09/09/2021   10:53 AM 07/22/2021    9:44 AM  Depression screen PHQ 2/9  Decreased Interest 0 0 0 0 0  Down, Depressed, Hopeless 0 0 0 0 0  PHQ - 2 Score 0 0 0 0 0  Altered sleeping 0 0 0    Tired, decreased energy 0 0 0    Change in appetite 0 0 0    Feeling bad or failure about yourself  0 0 0    Trouble concentrating 0 0 0    Moving slowly or fidgety/restless 0 0 0    Suicidal thoughts 0 0 0    PHQ-9 Score 0 0 0    Difficult doing work/chores  Not difficult at all Not difficult at all       Current Outpatient Medications  Medication Instructions   ACCU-CHEK GUIDE test strip Use to check blood glucose 4 times daily.   Accu-Chek Softclix Lancets lancets Use to check blood glucose 4 times daily.   aspirin EC 162 mg, Oral, Daily, Swallow whole.   Blood Glucose Monitoring Suppl (ACCU-CHEK GUIDE) w/Device KIT No dose, route, or frequency recorded.   Blood Pressure Monitor KIT Use to check blood pressure as instructed   calcium carbonate (OS-CAL) 1250 (500 Ca) MG chewable tablet 1 tablet, Daily   cetirizine (ZYRTEC) 10 MG tablet Take 1 tablet by mouth once daily   cholecalciferol (VITAMIN D3) 1,000 Units, Daily   Continuous Glucose Sensor (DEXCOM G6 SENSOR) MISC Change sensor every 10 days as directed    Continuous Glucose Transmitter (DEXCOM G6 TRANSMITTER) MISC Change transmitter every 90 days as directed.   ferrous sulfate 325 mg, Daily with breakfast   fluticasone (FLONASE) 50 MCG/ACT nasal spray 2 sprays, Each Nare, Daily   Insulin Disposable Pump (OMNIPOD 5 G6 INTRO, GEN 5,) KIT Change pod every 48-72 hours   Insulin Disposable Pump (OMNIPOD 5 G6 PODS, GEN 5,) MISC Change pod every 48-72 hours   insulin lispro (HUMALOG) 100 UNIT/ML injection Use with Omnipod for TDD around 70 units daily   Insulin Syringe-Needle U-100 (INSULIN SYRINGE .3CC/31GX5/16") 31G X 5/16" 0.3 ML MISC 100 each, Does not apply, Daily   labetalol (NORMODYNE) 100 mg, Oral, 2 times daily   NIFEdipine (PROCARDIA-XL/NIFEDICAL-XL) 30 mg, Oral, Daily   Prenatal Vit-Fe Fumarate-FA (PRENATAL VITAMIN PO) Take by mouth.     Review of Systems:   Pertinent items are noted in HPI Denies abnormal vaginal discharge w/ itching/odor/irritation, headaches, visual changes, shortness of breath, chest pain, abdominal pain, severe nausea/vomiting, or problems with urination or bowel movements unless otherwise stated above. Pertinent History Reviewed:  Reviewed past medical,surgical, social, obstetrical and family history.  Reviewed problem list, medications and allergies. Physical Assessment:   Vitals:   07/17/23 0906  BP: 121/83  Pulse: 89  Weight: 195 lb 9.6 oz (88.7 kg)  Body mass index is 34.65 kg/m.           Physical Examination:   General appearance: alert, well appearing, and in no distress  Mental status: normal mood, behavior, speech, dress, motor activity, and thought processes  Skin: warm & dry   Extremities:      Cardiovascular: normal heart rate noted  Respiratory: normal respiratory effort, no distress  Abdomen: gravid, soft, non-tender  Pelvic: Cervical exam deferred         Fetal Status:     Movement: Present    Fetal Surveillance Testing today: [place]:  GA = 33 weeks Single active fetus, complete breech,  FHR = 139 bpm  Fundal Left lateral posterior placenta Eccentric cord insertion at left fundal margin ~ 3-4 cm from margin AFI = 11.9 cm, 25%, MVP = 3.25 cm  BPP = 8/8,   RI: 0.70, 0.65 86%  good EDF   Chaperone: N/A    No results found for this or any previous visit (from the past 24 hours).   Assessment & Plan:  High-risk pregnancy: G2P0010 at [redacted]w[redacted]d with an Estimated Date of Delivery: 09/04/23   1. Type 1 diabetes mellitus with hyperglycemia (HCC) -sugars reviewed, continued to improve -pt being followed by endo every 2 weeks  2. Supervision of high risk pregnancy in third trimester (Primary) -discussed contraceptive options -Would prefer to add a pill to her current regimen   3. [redacted] weeks gestation of pregnancy   4. Chronic hypertension affecting pregnancy -BP stable, asymptomatic -continue current meds  5. Fetal growth restriction: severe less than 1%, UAD normal today Normal dopplers, BPP 8/8 Continue antepartum testing Based on EFW <3%, plan for IOL @ 37wks  6. Multiple myeloma in remission (HCC)  7. Breech presentation Discussed ECV and/or primary C-section Plan to review closer to 37wks    Meds: No orders of the defined types were placed in this encounter.   Labs/procedures today: BPP, doppler, Tdap  Treatment Plan:  as outlined above  Reviewed: Preterm labor symptoms and general obstetric precautions including but not limited to vaginal bleeding, contractions, leaking of fluid and fetal movement were reviewed in detail with the patient.  All questions were answered. Pt has home bp cuff. Check bp weekly, let us know if >140/90.   Follow-up: Return for as scheduled.   Future Appointments  Date Time Provider Department Center  07/20/2023  9:50 AM [facility] NURSE [facility]  07/24/2023  9:15 AM [facility] - FT IMG 2 [facility] None  07/24/2023 10:10 AM Lazaro Arms, MD [facility]  07/27/2023  9:30 AM [facility] NURSE [facility]  07/31/2023  9:15 AM [facility] -  FT IMG 2 [facility]FT[facility] None  07/31/2023 10:10 AM Myna Hidalgo, DO [facility]  08/03/2023  9:30 AM [facility] NURSE [facility]  08/07/2023  8:30 AM [facility] - FT IMG 2 [facility]FT[facility] None  08/07/2023  9:30 AM Myna Hidalgo, DO [facility]  08/10/2023  9:30 AM [facility] NURSE [facility]  08/14/2023  8:30 AM [facility] - [facility] [place] [facility] None  08/14/2023  9:30 AM Lazaro Arms, MD [facility]  08/17/2023  9:30 AM [facility] NURSE [facility]  08/21/2023  8:30 AM [facility] - FT IMG 2 [facility]FT[facility] None  08/21/2023  9:50 AM Lazaro Arms, MD [facility]  08/24/2023  9:30 AM [facility] NURSE [facility]  08/28/2023  8:30 AM [facility] - FT IMG 2 [facility]FT[facility] None  08/28/2023  9:30 AM Lazaro Arms,  MD [facility]  08/31/2023  9:10 AM [facility] NURSE [facility]  09/15/2023  8:30 AM Dani Gobble, NP RE[facility]-REA None  10/04/2023 10:45 AM [facility] LAB [facility] None  10/11/2023 11:45 AM Doreatha Massed, MD [facility] None    Orders Placed This Encounter  Procedures   Tdap vaccine greater than or equal to 7yo IM    Myna Hidalgo, DO Attending Obstetrician & Gynecologist, [facility] for [employer], [facility]

## 2023-07-18 ENCOUNTER — Other Ambulatory Visit: Payer: Self-pay | Admitting: Obstetrics & Gynecology

## 2023-07-18 DIAGNOSIS — Z3A29 29 weeks gestation of pregnancy: Secondary | ICD-10-CM

## 2023-07-18 DIAGNOSIS — O24319 Unspecified pre-existing diabetes mellitus in pregnancy, unspecified trimester: Secondary | ICD-10-CM

## 2023-07-18 DIAGNOSIS — O365990 Maternal care for other known or suspected poor fetal growth, unspecified trimester, not applicable or unspecified: Secondary | ICD-10-CM

## 2023-07-18 DIAGNOSIS — O10919 Unspecified pre-existing hypertension complicating pregnancy, unspecified trimester: Secondary | ICD-10-CM

## 2023-07-18 DIAGNOSIS — C9001 Multiple myeloma in remission: Secondary | ICD-10-CM

## 2023-07-18 DIAGNOSIS — O0992 Supervision of high risk pregnancy, unspecified, second trimester: Secondary | ICD-10-CM

## 2023-07-20 ENCOUNTER — Ambulatory Visit: Payer: Medicaid Other | Admitting: *Deleted

## 2023-07-20 VITALS — BP 127/85 | HR 84 | Wt 195.0 lb

## 2023-07-20 DIAGNOSIS — O10919 Unspecified pre-existing hypertension complicating pregnancy, unspecified trimester: Secondary | ICD-10-CM

## 2023-07-20 DIAGNOSIS — Z3A33 33 weeks gestation of pregnancy: Secondary | ICD-10-CM

## 2023-07-20 DIAGNOSIS — E1065 Type 1 diabetes mellitus with hyperglycemia: Secondary | ICD-10-CM

## 2023-07-20 DIAGNOSIS — O10913 Unspecified pre-existing hypertension complicating pregnancy, third trimester: Secondary | ICD-10-CM | POA: Diagnosis not present

## 2023-07-20 DIAGNOSIS — O0993 Supervision of high risk pregnancy, unspecified, third trimester: Secondary | ICD-10-CM | POA: Diagnosis not present

## 2023-07-20 NOTE — Progress Notes (Signed)
   NURSE VISIT- NST  SUBJECTIVE:  Diamond Robbins is a 33 y.o. G2P0010 female at [redacted]w[redacted]d, here for a NST for pregnancy complicated by [facility] and Diabetes: T1DM}.  She reports active fetal movement, contractions: none, vaginal bleeding: none, membranes: intact.   OBJECTIVE:  BP 127/85   Pulse 84   Wt 195 lb (88.5 kg)   LMP 11/28/2022   BMI 34.54 kg/m   Appears well, no apparent distress  No results found for this or any previous visit (from the past 24 hours).  NST: FHR baseline 120 bpm, Variability: moderate, Accelerations:present, Decelerations:  Absent= Cat  1/reactive Toco: none   ASSESSMENT: G2P0010 at [redacted]w[redacted]d with CHTN and Diabetes: T1DM} NST reactive  PLAN: EFM strip reviewed by Dr. Charlotta Newton   Recommendations: keep next appointment as scheduled    Jobe Marker  07/20/2023 11:56 AM

## 2023-07-24 ENCOUNTER — Encounter: Payer: Self-pay | Admitting: Nurse Practitioner

## 2023-07-24 ENCOUNTER — Ambulatory Visit: Payer: Medicaid Other | Admitting: Radiology

## 2023-07-24 ENCOUNTER — Ambulatory Visit: Payer: Medicaid Other | Admitting: Obstetrics & Gynecology

## 2023-07-24 ENCOUNTER — Encounter: Payer: Self-pay | Admitting: Obstetrics & Gynecology

## 2023-07-24 VITALS — BP 129/91 | HR 90 | Wt 196.0 lb

## 2023-07-24 DIAGNOSIS — O365930 Maternal care for other known or suspected poor fetal growth, third trimester, not applicable or unspecified: Secondary | ICD-10-CM | POA: Diagnosis not present

## 2023-07-24 DIAGNOSIS — O0993 Supervision of high risk pregnancy, unspecified, third trimester: Secondary | ICD-10-CM | POA: Diagnosis not present

## 2023-07-24 DIAGNOSIS — O24313 Unspecified pre-existing diabetes mellitus in pregnancy, third trimester: Secondary | ICD-10-CM

## 2023-07-24 DIAGNOSIS — O10919 Unspecified pre-existing hypertension complicating pregnancy, unspecified trimester: Secondary | ICD-10-CM

## 2023-07-24 DIAGNOSIS — Z3A34 34 weeks gestation of pregnancy: Secondary | ICD-10-CM

## 2023-07-24 DIAGNOSIS — O365990 Maternal care for other known or suspected poor fetal growth, unspecified trimester, not applicable or unspecified: Secondary | ICD-10-CM

## 2023-07-24 DIAGNOSIS — O43193 Other malformation of placenta, third trimester: Secondary | ICD-10-CM

## 2023-07-24 DIAGNOSIS — O10913 Unspecified pre-existing hypertension complicating pregnancy, third trimester: Secondary | ICD-10-CM

## 2023-07-24 DIAGNOSIS — O24319 Unspecified pre-existing diabetes mellitus in pregnancy, unspecified trimester: Secondary | ICD-10-CM

## 2023-07-24 DIAGNOSIS — Q270 Congenital absence and hypoplasia of umbilical artery: Secondary | ICD-10-CM

## 2023-07-24 DIAGNOSIS — O0992 Supervision of high risk pregnancy, unspecified, second trimester: Secondary | ICD-10-CM

## 2023-07-24 NOTE — Progress Notes (Signed)
 HIGH-RISK PREGNANCY VISIT Patient name: Diamond Robbins MRN [id number]  Date of birth: Oct 03, 1990 Chief Complaint:   Routine Prenatal Visit  History of Present Illness:   Diamond Robbins is a 33 y.o. G107P0010 female at [redacted]w[redacted]d with an Estimated Date of Delivery: 09/04/23 being seen today for ongoing management of a high-risk pregnancy complicated by    ICD-10-CM   1. Supervision of high risk pregnancy in third trimester  O09.93     2. Chronic hypertension affecting pregnancy: Nifedipine 30 qd + labetalol 100 BID  O10.919     3. Fetal growth restriction: severe <1%(235 grams interval growth) normal UAD  O36.5990     4. Class B DM(type 1) with pump + Omnipod:^10p-8a basal 1.4 u/hr, sens 1:5, ss 1/10>125  O24.319     5. Marginal insertion of umbilical cord: fundal  O43.193     6. Single umbilical artery  Q27.0       .    Today she reports no complaints. Contractions: Not present. Vag. Bleeding: None.  Movement: Present. denies leaking of fluid.      02/28/2023    9:39 AM 12/30/2022    9:23 AM 06/30/2022   11:24 AM 09/09/2021   10:53 AM 07/22/2021    9:44 AM  Depression screen PHQ 2/9  Decreased Interest 0 0 0 0 0  Down, Depressed, Hopeless 0 0 0 0 0  PHQ - 2 Score 0 0 0 0 0  Altered sleeping 0 0 0    Tired, decreased energy 0 0 0    Change in appetite 0 0 0    Feeling bad or failure about yourself  0 0 0    Trouble concentrating 0 0 0    Moving slowly or fidgety/restless 0 0 0    Suicidal thoughts 0 0 0    PHQ-9 Score 0 0 0    Difficult doing work/chores  Not difficult at all Not difficult at all          02/28/2023    9:39 AM 12/30/2022    9:22 AM 06/30/2022   11:24 AM  GAD 7 : Generalized Anxiety Score  Nervous, Anxious, on Edge 0 0 0  Control/stop worrying 0 0 0  Worry too much - different things 0 0 0  Trouble relaxing 0 0 0  Restless 0 0 0  Easily annoyed or irritable 0 0 0  Afraid - awful might happen 0 0 0  Total GAD 7 Score 0 0 0  Anxiety Difficulty  Not  difficult at all Not difficult at all     Review of Systems:   Pertinent items are noted in HPI Denies abnormal vaginal discharge w/ itching/odor/irritation, headaches, visual changes, shortness of breath, chest pain, abdominal pain, severe nausea/vomiting, or problems with urination or bowel movements unless otherwise stated above. Pertinent History Reviewed:  Reviewed past medical,surgical, social, obstetrical and family history.  Reviewed problem list, medications and allergies. Physical Assessment:   Vitals:   07/24/23 0951 07/24/23 1011  BP: (!) 137/92 (!) 129/91  Pulse: 96 90  Weight: 196 lb (88.9 kg)   Body mass index is 34.72 kg/m.           Physical Examination:   General appearance: alert, well appearing, and in no distress  Mental status: alert, oriented to person, place, and time  Skin: warm & dry   Extremities:      Cardiovascular: normal heart rate noted  Respiratory: normal respiratory effort, no distress  Abdomen: gravid,  soft, non-tender  Pelvic: Cervical exam deferred         Fetal Status:     Movement: Present    Fetal Surveillance Testing today: BPP 8/8 UAD 82%   Chaperone: N/A    No results found for this or any previous visit (from the past 24 hours).  Assessment & Plan:  High-risk pregnancy: G2P0010 at [redacted]w[redacted]d with an Estimated Date of Delivery: 09/04/23     ICD-10-CM   1. Supervision of high risk pregnancy in third trimester  O09.93     2. Chronic hypertension affecting pregnancy: Nifedipine 30 qd + labetalol 100 BID  O10.919     3. Fetal growth restriction: severe <1%(235 grams interval growth) normal UAD  O36.5990     4. Class B DM(type 1) with pump + Omnipod:^10p-8a basal 1.4 u/hr, sens 1:5, ss 1/10>125  O24.319     5. Marginal insertion of umbilical cord: fundal  O43.193     6. Single umbilical artery  Q27.0        Meds: No orders of the defined types were placed in this encounter.   Orders: No orders of the defined types were placed  in this encounter.    Labs/procedures today: U/S  Treatment Plan:  keep   Follow-up: No follow-ups on file.   Future Appointments  Date Time Provider Department Center  07/27/2023  9:30 AM [facility] NURSE [facility]  07/31/2023  9:15 AM [facility] - FT IMG 2 [facility] None  07/31/2023 10:10 AM Myna Hidalgo, DO [facility]  08/03/2023  9:30 AM [facility] NURSE [facility]  08/07/2023  8:30 AM [facility] - FT IMG 2 [facility] None  08/07/2023  9:30 AM Myna Hidalgo, DO [facility]  08/10/2023  9:30 AM [facility] NURSE [facility]  08/14/2023  8:30 AM [facility] - [facility]YN [place] [facility] None  08/14/2023  9:30 AM Lazaro Arms, MD [facility]  08/17/2023  9:30 AM [facility] NURSE [facility]  08/21/2023  8:30 AM [facility] - FT IMG 2 [facility] None  08/21/2023  9:50 AM Lazaro Arms, MD [facility]  08/24/2023  9:30 AM [facility] NURSE [facility]  08/28/2023  8:30 AM [facility] - FT IMG 2 [facility] None  08/28/2023  9:30 AM Lazaro Arms, MD [facility]  08/31/2023  9:10 AM [facility] NURSE [facility]  09/15/2023  8:30 AM Dani Gobble, NP REA-REA None  10/04/2023 10:45 AM [facility] LAB [facility] None  10/11/2023 11:45 AM Doreatha Massed, MD [facility] None    No orders of the defined types were placed in this encounter.  Lazaro Arms  Attending Physician for the Center for [facility] 07/24/2023 10:17 AM

## 2023-07-24 NOTE — Progress Notes (Addendum)
 [place]:  GA = 34 weeks Single active female fetus, cephalic, FHR = 132 bpm  Left Fundal Post pl, gr3 AFI = 8.6 cm  6.7%,  MVP = 3 cm   EFW < 1%  0%  BPP = 8/8,  RI @ 82% = 0.76, 0.64, 0.63 good EDF

## 2023-07-25 ENCOUNTER — Other Ambulatory Visit: Payer: Self-pay | Admitting: Obstetrics & Gynecology

## 2023-07-25 DIAGNOSIS — O365990 Maternal care for other known or suspected poor fetal growth, unspecified trimester, not applicable or unspecified: Secondary | ICD-10-CM

## 2023-07-25 DIAGNOSIS — O0992 Supervision of high risk pregnancy, unspecified, second trimester: Secondary | ICD-10-CM

## 2023-07-25 DIAGNOSIS — O24319 Unspecified pre-existing diabetes mellitus in pregnancy, unspecified trimester: Secondary | ICD-10-CM

## 2023-07-25 DIAGNOSIS — O10919 Unspecified pre-existing hypertension complicating pregnancy, unspecified trimester: Secondary | ICD-10-CM

## 2023-07-26 DIAGNOSIS — Z419 Encounter for procedure for purposes other than remedying health state, unspecified: Secondary | ICD-10-CM | POA: Diagnosis not present

## 2023-07-27 ENCOUNTER — Ambulatory Visit (INDEPENDENT_AMBULATORY_CARE_PROVIDER_SITE_OTHER): Payer: Medicaid Other | Admitting: *Deleted

## 2023-07-27 VITALS — BP 137/81 | HR 84

## 2023-07-27 DIAGNOSIS — Z3A34 34 weeks gestation of pregnancy: Secondary | ICD-10-CM | POA: Diagnosis not present

## 2023-07-27 DIAGNOSIS — O0993 Supervision of high risk pregnancy, unspecified, third trimester: Secondary | ICD-10-CM

## 2023-07-27 NOTE — Progress Notes (Signed)
   NURSE VISIT- NST  SUBJECTIVE:  Diamond Robbins is a 33 y.o. G2P0010 female at [redacted]w[redacted]d, here for a NST for pregnancy complicated by [facility], Diabetes: T1DM}, and FGR.  She reports active fetal movement, contractions: none, vaginal bleeding: none, membranes: intact.   OBJECTIVE:  BP 137/81   Pulse 84   LMP 11/28/2022   Appears well, no apparent distress  No results found for this or any previous visit (from the past 24 hours).  NST: FHR baseline 135 bpm, Variability: moderate, Accelerations:present, Decelerations:  Present  variables at beginning, resolved = Cat 2 due to variables Cat 1 after variables resolved/reactive Toco: none   ASSESSMENT: G2P0010 at [redacted]w[redacted]d with CHTN, Diabetes: T1DM}, and FGR NST reactive  PLAN: EFM strip reviewed by Dr. Despina Hidden   Recommendations: keep next appointment as scheduled    Annamarie Dawley  07/27/2023 12:31 PM

## 2023-07-28 ENCOUNTER — Other Ambulatory Visit: Payer: Self-pay | Admitting: Nurse Practitioner

## 2023-07-31 ENCOUNTER — Other Ambulatory Visit (HOSPITAL_COMMUNITY)
Admission: RE | Admit: 2023-07-31 | Discharge: 2023-07-31 | Disposition: A | Discharge status: Home / Self Care | Source: Ambulatory Visit | Attending: Obstetrics & Gynecology | Admitting: Obstetrics & Gynecology

## 2023-07-31 ENCOUNTER — Telehealth (HOSPITAL_COMMUNITY): Payer: Self-pay | Admitting: *Deleted

## 2023-07-31 ENCOUNTER — Ambulatory Visit: Payer: Medicaid Other | Admitting: Obstetrics & Gynecology

## 2023-07-31 ENCOUNTER — Encounter (HOSPITAL_COMMUNITY): Payer: Self-pay | Admitting: *Deleted

## 2023-07-31 ENCOUNTER — Ambulatory Visit: Payer: Medicaid Other | Admitting: Radiology

## 2023-07-31 ENCOUNTER — Encounter: Payer: Self-pay | Admitting: Obstetrics & Gynecology

## 2023-07-31 VITALS — BP 126/86 | HR 99 | Wt 195.8 lb

## 2023-07-31 DIAGNOSIS — O10913 Unspecified pre-existing hypertension complicating pregnancy, third trimester: Secondary | ICD-10-CM

## 2023-07-31 DIAGNOSIS — O0993 Supervision of high risk pregnancy, unspecified, third trimester: Secondary | ICD-10-CM | POA: Insufficient documentation

## 2023-07-31 DIAGNOSIS — O10919 Unspecified pre-existing hypertension complicating pregnancy, unspecified trimester: Secondary | ICD-10-CM

## 2023-07-31 DIAGNOSIS — O43193 Other malformation of placenta, third trimester: Secondary | ICD-10-CM | POA: Diagnosis not present

## 2023-07-31 DIAGNOSIS — N762 Acute vulvitis: Secondary | ICD-10-CM | POA: Diagnosis not present

## 2023-07-31 DIAGNOSIS — O24319 Unspecified pre-existing diabetes mellitus in pregnancy, unspecified trimester: Secondary | ICD-10-CM | POA: Diagnosis not present

## 2023-07-31 DIAGNOSIS — O365930 Maternal care for other known or suspected poor fetal growth, third trimester, not applicable or unspecified: Secondary | ICD-10-CM | POA: Diagnosis not present

## 2023-07-31 DIAGNOSIS — O365990 Maternal care for other known or suspected poor fetal growth, unspecified trimester, not applicable or unspecified: Secondary | ICD-10-CM

## 2023-07-31 DIAGNOSIS — N9089 Other specified noninflammatory disorders of vulva and perineum: Secondary | ICD-10-CM | POA: Diagnosis not present

## 2023-07-31 DIAGNOSIS — O0992 Supervision of high risk pregnancy, unspecified, second trimester: Secondary | ICD-10-CM

## 2023-07-31 DIAGNOSIS — Z3A35 35 weeks gestation of pregnancy: Secondary | ICD-10-CM

## 2023-07-31 DIAGNOSIS — O24313 Unspecified pre-existing diabetes mellitus in pregnancy, third trimester: Secondary | ICD-10-CM

## 2023-07-31 NOTE — Progress Notes (Signed)
 HIGH-RISK PREGNANCY VISIT Patient name: Diamond Robbins MRN [id number]  Date of birth: 1990-06-03 Chief Complaint:   Routine Prenatal Visit  History of Present Illness:   Diamond Robbins is a 33 y.o. G11P0010 female at [redacted]w[redacted]d with an Estimated Date of Delivery: 09/04/23 being seen today for ongoing management of a high-risk pregnancy complicated by:  -FGR -Type 1 DM -Chronic HTN -h/o multiple myeloma -Uterine fibroids  Of note, she reports a "skin tag" near her vulva.  States it has been there for a few weeks and didn't think much of it, but since we are doing an exam today thought to mention it.  On occasion she may see some spotting from this area.  Contractions: Not present. Vag. Bleeding: None.  Movement: Present. denies leaking of fluid.      02/28/2023    9:39 AM 12/30/2022    9:23 AM 06/30/2022   11:24 AM 09/09/2021   10:53 AM 07/22/2021    9:44 AM  Depression screen PHQ 2/9  Decreased Interest 0 0 0 0 0  Down, Depressed, Hopeless 0 0 0 0 0  PHQ - 2 Score 0 0 0 0 0  Altered sleeping 0 0 0    Tired, decreased energy 0 0 0    Change in appetite 0 0 0    Feeling bad or failure about yourself  0 0 0    Trouble concentrating 0 0 0    Moving slowly or fidgety/restless 0 0 0    Suicidal thoughts 0 0 0    PHQ-9 Score 0 0 0    Difficult doing work/chores  Not difficult at all Not difficult at all       Current Outpatient Medications  Medication Instructions   ACCU-CHEK GUIDE test strip Use to check blood glucose 4 times daily.   Accu-Chek Softclix Lancets lancets Use to check blood glucose 4 times daily.   aspirin EC 162 mg, Oral, Daily, Swallow whole.   Blood Glucose Monitoring Suppl (ACCU-CHEK GUIDE) w/Device KIT No dose, route, or frequency recorded.   Blood Pressure Monitor KIT Use to check blood pressure as instructed   calcium carbonate (OS-CAL) 1250 (500 Ca) MG chewable tablet 1 tablet, Daily   cetirizine (ZYRTEC) 10 MG tablet Take 1 tablet by mouth once daily    cholecalciferol (VITAMIN D3) 1,000 Units, Daily   Continuous Glucose Sensor (DEXCOM G6 SENSOR) MISC Change sensor every 10 days as directed   Continuous Glucose Transmitter (DEXCOM G6 TRANSMITTER) MISC Change transmitter every 90 days as directed.   ferrous sulfate 325 mg, Daily with breakfast   fluticasone (FLONASE) 50 MCG/ACT nasal spray 2 sprays, Each Nare, Daily   Insulin Disposable Pump (OMNIPOD 5 DEXG7G6 PODS GEN 5) MISC CHANGE pod every TWO TO THREE DAYS as directed.   Insulin Disposable Pump (OMNIPOD 5 G6 INTRO, GEN 5,) KIT Change pod every 48-72 hours   insulin lispro (HUMALOG) 100 UNIT/ML injection Use with Omnipod for TDD around 70 units daily   Insulin Syringe-Needle U-100 (INSULIN SYRINGE .3CC/31GX5/16") 31G X 5/16" 0.3 ML MISC 100 each, Does not apply, Daily   labetalol (NORMODYNE) 100 mg, Oral, 2 times daily   NIFEdipine (PROCARDIA-XL/NIFEDICAL-XL) 30 mg, Oral, Daily   Prenatal Vit-Fe Fumarate-FA (PRENATAL VITAMIN PO) Take by mouth.     Review of Systems:   Pertinent items are noted in HPI Denies abnormal vaginal discharge w/ itching/odor/irritation, headaches, visual changes, shortness of breath, chest pain, abdominal pain, severe nausea/vomiting, or problems with urination or bowel movements unless  otherwise stated above. Pertinent History Reviewed:  Reviewed past medical,surgical, social, obstetrical and family history.  Reviewed problem list, medications and allergies. Physical Assessment:   Vitals:   07/31/23 0958  BP: 126/86  Pulse: 99  Weight: 195 lb 12.8 oz (88.8 kg)  Body mass index is 34.68 kg/m.           Physical Examination:   General appearance: alert, well appearing, and in no distress  Mental status: normal mood, behavior, speech, dress, motor activity, and thought processes  Skin: warm & dry   Extremities: Edema: None    Cardiovascular: normal heart rate noted  Respiratory: normal respiratory effort, no distress  Abdomen: gravid, soft,  non-tender  Pelvic:  Left labia majora ~ 2cm pink irregular shaped exophytic mass.  No other abnormalities noted.  Cervical swabs obtained          Fetal Status:     Movement: Present    Fetal Surveillance Testing today: Single active female fetus, cephalic, FHR = 131 bpm, posterior pl, gr3 AFI = 10.6 cm, 18%, MVP = 3.7 cm     BPP = 8/8     RI: 0.67, 0.73  90.5% good EDF   Chaperone: Latisha Cresenzo    VULVAR LESION REMOVAL NOTE The indications for vulvar biopsy (rule out neoplasia) were reviewed.   Risks of the biopsy including pain, bleeding, infection, inadequate specimen, scarring and need for additional procedures  were discussed. The patient stated understanding and agreed to undergo procedure today. Consent was signed,  time out performed.  The patient's vulva was prepped with Betadine. 2% lidocaine was injected into the vulvar.  Using a 15 blade, the lesion was excised. Small bleeding was noted and hemostasis was achieved using silver nitrate stick and 2 interrupted suture of 3-0 silk.   The patient tolerated the procedure well. Post-procedure instructions  (pelvic rest for one week) were given to the patient. The patient is to call with heavy bleeding, fever greater than 100.4, foul smelling vaginal discharge or other concerns.     Assessment & Plan:  High-risk pregnancy: G2P0010 at [redacted]w[redacted]d with an Estimated Date of Delivery: 09/04/23   1. Fetal growth restriction: severe <1%(235 grams interval growth) normal UAD -normal dopplers today -continue antepartum testing -plan for IOL @ 37wks  2. Supervision of high risk pregnancy in third trimester (Primary) -GBS, GC collected today  3. Class B DM(type 1) with pump + Omnipod:^10p-8a basal 1.4 u/hr, sens 1:5, ss 1/10>125 Did not bring log, followed by endocrinology No change currently, per pt report, fairly well controlled  4. Chronic hypertension affecting pregnancy: Nifedipine 30 qd + labetalol 100 BID BP stable Continue with  current meds  5. Vulvar lesion -removed without difficulty -suture placed- plan for removal next week   Meds: No orders of the defined types were placed in this encounter.   Labs/procedures today: BPP, dopplers  Treatment Plan:  routine OB care and as outlined above  Reviewed: Preterm labor symptoms and general obstetric precautions including but not limited to vaginal bleeding, contractions, leaking of fluid and fetal movement were reviewed in detail with the patient.  All questions were answered. Pt has home bp cuff. Check bp weekly, let us know if >140/90.   Follow-up: Return for twice weekly as scheduled.   Future Appointments  Date Time Provider Department Center  08/03/2023  9:30 AM [facility] NURSE [facility]  08/07/2023  8:30 AM [facility] 2 [facility] None  08/07/2023  9:30 AM Myna Hidalgo, DO  [facility]  08/10/2023  9:30 AM [facility] NURSE [facility]  08/14/2023  8:30 AM [facility] [place] [facility] None  08/14/2023  9:30 AM Lazaro Arms, MD [facility]  08/15/2023 12:00 AM [facility] SCHED ROOM [facility] None  08/17/2023  9:30 AM [facility] NURSE [facility]  08/21/2023  8:30 AM [facility] IMG 2 [facility] None  08/21/2023  9:50 AM Lazaro Arms, MD [facility]  08/24/2023  9:30 AM [facility] NURSE [facility]  08/28/2023  8:30 AM [facility] IMG 2 [facility] None  08/28/2023  9:30 AM Lazaro Arms, MD [facility]  08/31/2023  9:10 AM [facility] NURSE [facility]  09/15/2023  8:30 AM Dani Gobble, NP [facility] None  10/04/2023 10:45 AM [facility] LAB [facility] None  10/11/2023 11:45 AM Doreatha Massed, MD [facility] None    No orders of the defined types were placed in this encounter.   Myna Hidalgo, DO Attending Obstetrician & Gynecologist, [facility] for [employer], [facility]

## 2023-07-31 NOTE — Progress Notes (Signed)
 [place]: GA = 35 weeks Single active female fetus, cephalic, FHR = 131 bpm, posterior pl, gr3 AFI = 10.6 cm, 18%, MVP = 3.7 cm     BPP = 8/8     RI: 0.67, 0.73  90.5% good EDF

## 2023-07-31 NOTE — Telephone Encounter (Signed)
 Preadmission screen

## 2023-08-01 ENCOUNTER — Encounter: Payer: Self-pay | Admitting: Obstetrics & Gynecology

## 2023-08-01 LAB — CERVICOVAGINAL ANCILLARY ONLY
Chlamydia: NEGATIVE
Comment: NEGATIVE
Comment: NORMAL
Neisseria Gonorrhea: NEGATIVE

## 2023-08-01 LAB — SURGICAL PATHOLOGY

## 2023-08-03 ENCOUNTER — Other Ambulatory Visit: Payer: Self-pay | Admitting: Obstetrics & Gynecology

## 2023-08-03 ENCOUNTER — Other Ambulatory Visit: Payer: Medicaid Other

## 2023-08-03 DIAGNOSIS — O10919 Unspecified pre-existing hypertension complicating pregnancy, unspecified trimester: Secondary | ICD-10-CM

## 2023-08-03 DIAGNOSIS — O365990 Maternal care for other known or suspected poor fetal growth, unspecified trimester, not applicable or unspecified: Secondary | ICD-10-CM

## 2023-08-03 DIAGNOSIS — O0992 Supervision of high risk pregnancy, unspecified, second trimester: Secondary | ICD-10-CM

## 2023-08-03 DIAGNOSIS — O24319 Unspecified pre-existing diabetes mellitus in pregnancy, unspecified trimester: Secondary | ICD-10-CM

## 2023-08-04 LAB — CULTURE, BETA STREP (GROUP B ONLY): Strep Gp B Culture: NEGATIVE

## 2023-08-07 ENCOUNTER — Encounter: Payer: Self-pay | Admitting: Obstetrics & Gynecology

## 2023-08-07 ENCOUNTER — Ambulatory Visit: Payer: Medicaid Other | Admitting: Radiology

## 2023-08-07 ENCOUNTER — Other Ambulatory Visit: Payer: Self-pay | Admitting: Obstetrics & Gynecology

## 2023-08-07 ENCOUNTER — Ambulatory Visit: Payer: Medicaid Other | Admitting: Obstetrics & Gynecology

## 2023-08-07 VITALS — BP 150/103 | HR 106 | Wt 196.0 lb

## 2023-08-07 DIAGNOSIS — O0993 Supervision of high risk pregnancy, unspecified, third trimester: Secondary | ICD-10-CM | POA: Diagnosis not present

## 2023-08-07 DIAGNOSIS — O0992 Supervision of high risk pregnancy, unspecified, second trimester: Secondary | ICD-10-CM

## 2023-08-07 DIAGNOSIS — O365930 Maternal care for other known or suspected poor fetal growth, third trimester, not applicable or unspecified: Secondary | ICD-10-CM | POA: Diagnosis not present

## 2023-08-07 DIAGNOSIS — O10913 Unspecified pre-existing hypertension complicating pregnancy, third trimester: Secondary | ICD-10-CM | POA: Diagnosis not present

## 2023-08-07 DIAGNOSIS — O365990 Maternal care for other known or suspected poor fetal growth, unspecified trimester, not applicable or unspecified: Secondary | ICD-10-CM | POA: Insufficient documentation

## 2023-08-07 DIAGNOSIS — O24319 Unspecified pre-existing diabetes mellitus in pregnancy, unspecified trimester: Secondary | ICD-10-CM

## 2023-08-07 DIAGNOSIS — Z3A36 36 weeks gestation of pregnancy: Secondary | ICD-10-CM

## 2023-08-07 DIAGNOSIS — O24313 Unspecified pre-existing diabetes mellitus in pregnancy, third trimester: Secondary | ICD-10-CM | POA: Diagnosis not present

## 2023-08-07 DIAGNOSIS — O43193 Other malformation of placenta, third trimester: Secondary | ICD-10-CM

## 2023-08-07 DIAGNOSIS — Q270 Congenital absence and hypoplasia of umbilical artery: Secondary | ICD-10-CM | POA: Diagnosis not present

## 2023-08-07 DIAGNOSIS — O10919 Unspecified pre-existing hypertension complicating pregnancy, unspecified trimester: Secondary | ICD-10-CM

## 2023-08-07 NOTE — Progress Notes (Signed)
 [place]:  GA = 36 weeks Single active female fetus, cephalic, FHR = 138 bpm,  fundal posterior left lateral placenta with marginal cord insertion seen at fundal left margin. Gr3, 2VC. AFI = 6.4 cm 2.3%, MVP = 2.4 cm EFW <1%  severe FGR 1559g  BPP = 8/8,  RI: 0.72, 0.69  96.5% good EDF

## 2023-08-07 NOTE — Progress Notes (Signed)
 Brief Physician to Physician consultation  I received a call from Dr. Charlotta Newton to discuss the timing of delivery of Diamond Robbins.  Ms. Kirsch is a 33 yo G2P0 at 21 w 0d with an EDD of 09/04/23 dated by an 8 wk [place].  She was seen today for follow up growth and UAD- which demonstrated elevated UAD no AEDF or REDF in the setting of severe FGR of < 1%, SUA, MCI and low normal amniotic fluid volume.  In addition, she hasT1DM and chronic hypertension which appears well controlled.  I discussed with Dr. Charlotta Newton that ACOG allows delivery of FGR pregnancies with other comorbidities or complications such as oligohydramnios, maternal chronic disease to be delivered between 54 and 36/6/7 weeks.   She is scheduled for a 37 week delivery given severe FGR.  She will discuss the benefits and risk of 36 week delivery with Ms. Sills and offer additional testing options such as repeat UAD on Wed.  All questions answered I spent 30 minutes in discussion and medical record review.   Novella Olive,, MD

## 2023-08-07 NOTE — Progress Notes (Signed)
 HIGH-RISK PREGNANCY VISIT Patient name: Diamond Robbins MRN [id number]  Date of birth: 1991/01/30 Chief Complaint:   Routine Prenatal Visit  History of Present Illness:   Diamond Robbins is a 33 y.o. G88P0010 female at [redacted]w[redacted]d with an Estimated Date of Delivery: 09/04/23 being seen today for ongoing management of a high-risk pregnancy complicated by: -severe FGR -Type 1 DM Followed by endocrinology Mostly well controlled -Chronic HTN Asymptomatic, forgot to take medication this am -h/o multiple myeloma -uterine fibroids -SUA  Today she reports no complaints.  Last visit vulvar lesion was removed- benign pathology.  She reports the the stitch fell out on its own  Contractions: Irritability. Vag. Bleeding: None.  Movement: Present. denies leaking of fluid.      02/28/2023    9:39 AM 12/30/2022    9:23 AM 06/30/2022   11:24 AM 09/09/2021   10:53 AM 07/22/2021    9:44 AM  Depression screen PHQ 2/9  Decreased Interest 0 0 0 0 0  Down, Depressed, Hopeless 0 0 0 0 0  PHQ - 2 Score 0 0 0 0 0  Altered sleeping 0 0 0    Tired, decreased energy 0 0 0    Change in appetite 0 0 0    Feeling bad or failure about yourself  0 0 0    Trouble concentrating 0 0 0    Moving slowly or fidgety/restless 0 0 0    Suicidal thoughts 0 0 0    PHQ-9 Score 0 0 0    Difficult doing work/chores  Not difficult at all Not difficult at all       Current Outpatient Medications  Medication Instructions   ACCU-CHEK GUIDE test strip Use to check blood glucose 4 times daily.   Accu-Chek Softclix Lancets lancets Use to check blood glucose 4 times daily.   aspirin EC 162 mg, Oral, Daily, Swallow whole.   Blood Glucose Monitoring Suppl (ACCU-CHEK GUIDE) w/Device KIT No dose, route, or frequency recorded.   Blood Pressure Monitor KIT Use to check blood pressure as instructed   calcium carbonate (OS-CAL) 1250 (500 Ca) MG chewable tablet 1 tablet, Daily   cetirizine (ZYRTEC) 10 MG tablet Take 1 tablet by mouth once  daily   cholecalciferol (VITAMIN D3) 1,000 Units, Daily   Continuous Glucose Sensor (DEXCOM G6 SENSOR) MISC Change sensor every 10 days as directed   Continuous Glucose Transmitter (DEXCOM G6 TRANSMITTER) MISC Change transmitter every 90 days as directed.   ferrous sulfate 325 mg, Daily with breakfast   fluticasone (FLONASE) 50 MCG/ACT nasal spray 2 sprays, Each Nare, Daily   Insulin Disposable Pump (OMNIPOD 5 DEXG7G6 PODS GEN 5) MISC CHANGE pod every TWO TO THREE DAYS as directed.   Insulin Disposable Pump (OMNIPOD 5 G6 INTRO, GEN 5,) KIT Change pod every 48-72 hours   insulin lispro (HUMALOG) 100 UNIT/ML injection Use with Omnipod for TDD around 70 units daily   Insulin Syringe-Needle U-100 (INSULIN SYRINGE .3CC/31GX5/16") 31G X 5/16" 0.3 ML MISC 100 each, Does not apply, Daily   labetalol (NORMODYNE) 100 mg, Oral, 2 times daily   NIFEdipine (PROCARDIA-XL/NIFEDICAL-XL) 30 mg, Oral, Daily   Prenatal Vit-Fe Fumarate-FA (PRENATAL VITAMIN PO) Take by mouth.     Review of Systems:   Pertinent items are noted in HPI Denies abnormal vaginal discharge w/ itching/odor/irritation, headaches, visual changes, shortness of breath, chest pain, abdominal pain, severe nausea/vomiting, or problems with urination or bowel movements unless otherwise stated above. Pertinent History Reviewed:  Reviewed past medical,surgical, social,  obstetrical and family history.  Reviewed problem list, medications and allergies. Physical Assessment:   Vitals:   08/07/23 0913 08/07/23 0938  BP: (!) 134/94 (!) 150/103  Pulse: 91 (!) 106  Weight: 196 lb (88.9 kg)   Body mass index is 34.72 kg/m.           Physical Examination:   General appearance: alert, well appearing, and in no distress  Mental status: normal mood, behavior, speech, dress, motor activity, and thought processes  Skin: warm & dry   Extremities:      Cardiovascular: normal heart rate noted  Respiratory: normal respiratory effort, no  distress  Abdomen: gravid, soft, non-tender  Pelvic: Cervical exam deferred left labia-healing appropriately- prior stitch not present, no erythema or exudate.  Healing appropriately        Fetal Status:     Movement: Present    Fetal Surveillance Testing today: Single active female fetus, cephalic, FHR = 138 bpm,  fundal posterior left lateral placenta with marginal cord insertion seen at fundal left margin. Gr3, 2VC. AFI = 6.4 cm 2.3%, MVP = 2.4 cm EFW <1%  severe FGR 1559g  BPP = 8/8,  RI: 0.72, 0.69  96.5% good EDF   Chaperone: Faith Rogue    No results found for this or any previous visit (from the past 24 hours).   Assessment & Plan:  High-risk pregnancy: G2P0010 at [redacted]w[redacted]d with an Estimated Date of Delivery: 09/04/23   1. Fetal growth restriction: severe <1%(444 grams interval growth) elevated UAD -elevated dopplers noted today, previously 90% -interval growth of 444g from prior imaging, still <1% -BPP 8/8  -based on [facility] current recommendation would be IOL @ 37wk; however in  ACOG would consider IOL 34-36wk pending management of co-morbidities -at this time co-morbidities have been stable.  BP elevated today; however pt did not take medication -reviewed management plan with MFM- plan for repeat Dopplers on Wed. If still elevated plan to proceed with IOL -otherwise scheduled for IOL @ 37wk- 3/31  2. Supervision of high risk pregnancy in third trimester (Primary)   3. Class B DM(type 1) with pump + Omnipod:^10p-8a basal 1.4 u/hr, sens 1:5, ss 1/10>125 -pt did not have log -per pt reports fairly well controlled- no change  4. Chronic hypertension affecting pregnancy: Nifedipine 30 qd + labetalol 100 BID -pt to go home and take medications and send mychart message with BP results -if BP elevated today s/p meds, will plan to proceed with IOL  5. Marginal insertion of umbilical cord: fundal   6. Single umbilical artery    Meds: No orders of the defined types were placed in  this encounter.   Labs/procedures today: BPP,dopplers  Treatment Plan:  as outlined above  Reviewed: Preterm labor symptoms and general obstetric precautions including but not limited to vaginal bleeding, contractions, leaking of fluid and fetal movement were reviewed in detail with the patient.  All questions were answered. PT has home bp cuff. Check bp weekly, let us know if >140/90.   Follow-up: No follow-ups on file.   Future Appointments  Date Time Provider Department Center  08/09/2023 11:30 AM [facility] - FT IMG 2 [facility] None  08/10/2023  9:30 AM [facility] NURSE [facility]  08/14/2023  7:15 AM [facility] SCHED ROOM [facility] None  08/14/2023  8:30 AM [facility] - [facility] [place] [facility] None  08/14/2023  9:30 AM Lazaro Arms, MD [facility]  09/15/2023  8:30 AM Dani Gobble, NP [facility] None  10/04/2023 10:45 AM [facility]  LAB [facility] None  10/11/2023 11:45 AM Doreatha Massed, MD [facility] None    No orders of the defined types were placed in this encounter.   Myna Hidalgo, DO Attending Obstetrician & Gynecologist, [facility] for [employer], [facility]

## 2023-08-09 ENCOUNTER — Encounter (HOSPITAL_COMMUNITY): Payer: Self-pay | Admitting: Anesthesiology

## 2023-08-09 ENCOUNTER — Encounter (HOSPITAL_COMMUNITY): Payer: Self-pay | Admitting: Obstetrics & Gynecology

## 2023-08-09 ENCOUNTER — Other Ambulatory Visit: Payer: Self-pay

## 2023-08-09 ENCOUNTER — Inpatient Hospital Stay (HOSPITAL_COMMUNITY)
Admission: AD | Admit: 2023-08-09 | Discharge: 2023-08-12 | DRG: 805 | Disposition: A | Discharge status: Home / Self Care | Attending: Obstetrics & Gynecology | Admitting: Obstetrics & Gynecology

## 2023-08-09 ENCOUNTER — Ambulatory Visit (INDEPENDENT_AMBULATORY_CARE_PROVIDER_SITE_OTHER): Admitting: Radiology

## 2023-08-09 DIAGNOSIS — O365990 Maternal care for other known or suspected poor fetal growth, unspecified trimester, not applicable or unspecified: Principal | ICD-10-CM | POA: Diagnosis present

## 2023-08-09 DIAGNOSIS — O26893 Other specified pregnancy related conditions, third trimester: Secondary | ICD-10-CM | POA: Diagnosis not present

## 2023-08-09 DIAGNOSIS — Z8249 Family history of ischemic heart disease and other diseases of the circulatory system: Secondary | ICD-10-CM | POA: Diagnosis not present

## 2023-08-09 DIAGNOSIS — O3413 Maternal care for benign tumor of corpus uteri, third trimester: Secondary | ICD-10-CM | POA: Diagnosis not present

## 2023-08-09 DIAGNOSIS — Z3A36 36 weeks gestation of pregnancy: Secondary | ICD-10-CM | POA: Diagnosis not present

## 2023-08-09 DIAGNOSIS — Z794 Long term (current) use of insulin: Secondary | ICD-10-CM | POA: Diagnosis not present

## 2023-08-09 DIAGNOSIS — O99214 Obesity complicating childbirth: Secondary | ICD-10-CM | POA: Diagnosis present

## 2023-08-09 DIAGNOSIS — O1002 Pre-existing essential hypertension complicating childbirth: Secondary | ICD-10-CM | POA: Diagnosis not present

## 2023-08-09 DIAGNOSIS — Z9484 Stem cells transplant status: Secondary | ICD-10-CM | POA: Diagnosis not present

## 2023-08-09 DIAGNOSIS — O0993 Supervision of high risk pregnancy, unspecified, third trimester: Secondary | ICD-10-CM

## 2023-08-09 DIAGNOSIS — O0992 Supervision of high risk pregnancy, unspecified, second trimester: Secondary | ICD-10-CM

## 2023-08-09 DIAGNOSIS — Z833 Family history of diabetes mellitus: Secondary | ICD-10-CM

## 2023-08-09 DIAGNOSIS — O24319 Unspecified pre-existing diabetes mellitus in pregnancy, unspecified trimester: Secondary | ICD-10-CM

## 2023-08-09 DIAGNOSIS — O2402 Pre-existing diabetes mellitus, type 1, in childbirth: Secondary | ICD-10-CM | POA: Diagnosis present

## 2023-08-09 DIAGNOSIS — O10913 Unspecified pre-existing hypertension complicating pregnancy, third trimester: Secondary | ICD-10-CM

## 2023-08-09 DIAGNOSIS — O4423 Partial placenta previa NOS or without hemorrhage, third trimester: Secondary | ICD-10-CM | POA: Diagnosis not present

## 2023-08-09 DIAGNOSIS — O2412 Pre-existing diabetes mellitus, type 2, in childbirth: Secondary | ICD-10-CM | POA: Diagnosis not present

## 2023-08-09 DIAGNOSIS — O43193 Other malformation of placenta, third trimester: Secondary | ICD-10-CM | POA: Diagnosis not present

## 2023-08-09 DIAGNOSIS — O365930 Maternal care for other known or suspected poor fetal growth, third trimester, not applicable or unspecified: Secondary | ICD-10-CM

## 2023-08-09 DIAGNOSIS — D259 Leiomyoma of uterus, unspecified: Secondary | ICD-10-CM | POA: Diagnosis present

## 2023-08-09 DIAGNOSIS — O1092 Unspecified pre-existing hypertension complicating childbirth: Secondary | ICD-10-CM | POA: Diagnosis not present

## 2023-08-09 DIAGNOSIS — E785 Hyperlipidemia, unspecified: Secondary | ICD-10-CM | POA: Diagnosis present

## 2023-08-09 DIAGNOSIS — Z823 Family history of stroke: Secondary | ICD-10-CM

## 2023-08-09 DIAGNOSIS — O10919 Unspecified pre-existing hypertension complicating pregnancy, unspecified trimester: Secondary | ICD-10-CM

## 2023-08-09 DIAGNOSIS — Z9641 Presence of insulin pump (external) (internal): Secondary | ICD-10-CM | POA: Diagnosis present

## 2023-08-09 LAB — COMPREHENSIVE METABOLIC PANEL
ALT: 19 U/L (ref 0–44)
AST: 22 U/L (ref 15–41)
Albumin: 3 g/dL — ABNORMAL LOW (ref 3.5–5.0)
Alkaline Phosphatase: 123 U/L (ref 38–126)
Anion gap: 12 (ref 5–15)
BUN: 9 mg/dL (ref 6–20)
CO2: 18 mmol/L — ABNORMAL LOW (ref 22–32)
Calcium: 9.8 mg/dL (ref 8.9–10.3)
Chloride: 104 mmol/L (ref 98–111)
Creatinine, Ser: 0.64 mg/dL (ref 0.44–1.00)
GFR, Estimated: >60 mL/min (ref >60)
Glucose, Bld: 124 mg/dL — ABNORMAL HIGH (ref 70–99)
Potassium: 3.9 mmol/L (ref 3.5–5.1)
Sodium: 134 mmol/L — ABNORMAL LOW (ref 135–145)
Total Bilirubin: 0.6 mg/dL (ref 0.0–1.2)
Total Protein: 7 g/dL (ref 6.5–8.1)

## 2023-08-09 LAB — GLUCOSE, CAPILLARY
Glucose-Capillary: 151 mg/dL — ABNORMAL HIGH (ref 70–99)
Glucose-Capillary: 115 mg/dL — ABNORMAL HIGH (ref 70–99)
Glucose-Capillary: 116 mg/dL — ABNORMAL HIGH (ref 70–99)
Glucose-Capillary: 111 mg/dL — ABNORMAL HIGH (ref 70–99)

## 2023-08-09 LAB — PROTEIN / CREATININE RATIO, URINE
Creatinine, Urine: 53 mg/dL
Protein Creatinine Ratio: 0.25 mg/mg{creat} — ABNORMAL HIGH (ref 0.00–0.15)
Total Protein, Urine: 13 mg/dL

## 2023-08-09 LAB — CBC
HCT: 39.6 % (ref 36.0–46.0)
Hemoglobin: 13.6 g/dL (ref 12.0–15.0)
MCH: 29.2 pg (ref 26.0–34.0)
MCHC: 34.3 g/dL (ref 30.0–36.0)
MCV: 85 fL (ref 80.0–100.0)
Platelets: 301 10*3/uL (ref 150–400)
RBC: 4.66 MIL/uL (ref 3.87–5.11)
RDW: 13 % (ref 11.5–15.5)
WBC: 7.5 10*3/uL (ref 4.0–10.5)
nRBC: 0 % (ref 0.0–0.2)

## 2023-08-09 LAB — TYPE AND SCREEN
ABO/RH(D): B POS
Antibody Screen: NEGATIVE

## 2023-08-09 MED ORDER — INSULIN REGULAR(HUMAN) IN NACL 100-0.9 UT/100ML-% IV SOLN
INTRAVENOUS | Status: DC
  Administered 2023-08-09: 1.6 [IU]/h via INTRAVENOUS
  Filled 2023-08-09: qty 100

## 2023-08-09 MED ORDER — NIFEDIPINE ER OSMOTIC RELEASE 30 MG PO TB24
30.0000 mg | ORAL_TABLET | Freq: Every day | ORAL | Status: DC
  Administered 2023-08-10: 30 mg via ORAL
  Filled 2023-08-09: qty 1

## 2023-08-09 MED ORDER — LABETALOL HCL 100 MG PO TABS
100.0000 mg | ORAL_TABLET | Freq: Two times a day (BID) | ORAL | Status: DC
  Administered 2023-08-09 – 2023-08-10 (×3): 100 mg via ORAL
  Filled 2023-08-09 (×3): qty 1

## 2023-08-09 MED ORDER — DEXTROSE 50 % IV SOLN: 0.0000 mL | INTRAVENOUS | Status: DC | PRN

## 2023-08-09 MED ORDER — OXYCODONE-ACETAMINOPHEN 5-325 MG PO TABS: 1.0000 | ORAL_TABLET | ORAL | Status: DC | PRN

## 2023-08-09 MED ORDER — ACETAMINOPHEN 325 MG PO TABS: 650.0000 mg | ORAL_TABLET | ORAL | Status: DC | PRN

## 2023-08-09 MED ORDER — TERBUTALINE SULFATE 1 MG/ML IJ SOLN: 0.2500 mg | Freq: Once | INTRAMUSCULAR | Status: DC | PRN

## 2023-08-09 MED ORDER — NIFEDIPINE 10 MG PO CAPS: 20.0000 mg | ORAL_CAPSULE | ORAL | Status: DC | PRN

## 2023-08-09 MED ORDER — FENTANYL CITRATE (PF) 100 MCG/2ML IJ SOLN
50.0000 ug | INTRAMUSCULAR | Status: DC | PRN
  Administered 2023-08-09 – 2023-08-10 (×2): 100 ug via INTRAVENOUS
  Filled 2023-08-09 (×2): qty 2

## 2023-08-09 MED ORDER — LACTATED RINGERS IV SOLN: 500.0000 mL | INTRAVENOUS | Status: DC | PRN

## 2023-08-09 MED ORDER — OXYTOCIN BOLUS FROM INFUSION
333.0000 mL | Freq: Once | INTRAVENOUS | Status: DC
  Administered 2023-08-10: 333 mL via INTRAVENOUS

## 2023-08-09 MED ORDER — OXYTOCIN-SODIUM CHLORIDE 30-0.9 UT/500ML-% IV SOLN
2.5000 [IU]/h | INTRAVENOUS | Status: DC
  Administered 2023-08-10: 2.5 [IU]/h via INTRAVENOUS
  Filled 2023-08-09: qty 500

## 2023-08-09 MED ORDER — INSULIN PUMP
SUBCUTANEOUS | Status: DC
  Filled 2023-08-09: qty 1

## 2023-08-09 MED ORDER — LACTATED RINGERS IV SOLN: INTRAVENOUS | Status: DC

## 2023-08-09 MED ORDER — LABETALOL HCL 5 MG/ML IV SOLN: 40.0000 mg | INTRAVENOUS | Status: DC | PRN

## 2023-08-09 MED ORDER — SOD CITRATE-CITRIC ACID 500-334 MG/5ML PO SOLN: 30.0000 mL | ORAL | Status: DC | PRN

## 2023-08-09 MED ORDER — LIDOCAINE HCL (PF) 1 % IJ SOLN: 30.0000 mL | INTRAMUSCULAR | Status: DC | PRN

## 2023-08-09 MED ORDER — ONDANSETRON HCL 4 MG/2ML IJ SOLN: 4.0000 mg | Freq: Four times a day (QID) | INTRAMUSCULAR | Status: DC | PRN

## 2023-08-09 MED ORDER — DEXTROSE IN LACTATED RINGERS 5 % IV SOLN: INTRAVENOUS | Status: DC

## 2023-08-09 MED ORDER — OXYTOCIN-SODIUM CHLORIDE 30-0.9 UT/500ML-% IV SOLN
1.0000 m[IU]/min | INTRAVENOUS | Status: DC
  Administered 2023-08-09: 2 m[IU]/min via INTRAVENOUS
  Filled 2023-08-09: qty 500

## 2023-08-09 MED ORDER — NIFEDIPINE 10 MG PO CAPS: 10.0000 mg | ORAL_CAPSULE | ORAL | Status: DC | PRN

## 2023-08-09 MED ORDER — OXYCODONE-ACETAMINOPHEN 5-325 MG PO TABS: 2.0000 | ORAL_TABLET | ORAL | Status: DC | PRN

## 2023-08-09 NOTE — Anesthesia Preprocedure Evaluation (Signed)
 Anesthesia Evaluation  Patient identified by MRN, date of birth, ID band Patient awake    Reviewed: Allergy & Precautions, Patient's Chart, lab work & pertinent test results  Airway Mallampati: II  TM Distance: >3 FB Neck ROM: Full    Dental no notable dental hx.    Pulmonary neg pulmonary ROS   Pulmonary exam normal breath sounds clear to auscultation       Cardiovascular hypertension (on procardia and labetalol), Pt. on medications and Pt. 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 irradiated RBCs   Anesthesia Other Findings   Reproductive/Obstetrics (+) Pregnancy Severe IUGR w Oligo                             Anesthesia Physical Anesthesia Plan  ASA: 3  Anesthesia Plan: Epidural   Post-op Pain Management:    Induction:   PONV Risk Score and Plan:   Airway Management Planned:   Additional Equipment: None  Intra-op Plan:   Post-operative Plan:   Informed Consent: I have reviewed the patients History and Physical, chart, labs and discussed the procedure including the risks, benefits and alternatives for the proposed anesthesia with the patient or authorized representative who has indicated his/her understanding and acceptance.       Plan Discussed with: CRNA  Anesthesia Plan Comments: (36.2 Wk G2P0 S/P BM tx, cHtn and Severe IUGR for LEA)       Anesthesia Quick Evaluation

## 2023-08-09 NOTE — H&P (Signed)
 Diamond Robbins is a 33 y.o. female, G2P0010 at 36.2 weeks, presenting for IOL.  Patient with elevated dopplers and Severe FGR in office today. After MFM consult, decision made to induce. Patient receives care at [facility] and was supervised for a high-risk pregnancy. Pregnancy and medical history significant for problems as listed below. She is GBS negative and expresses a desire for epidural for pain management.  She is anticipating a female infant and requests oral contraception for PP birth control method.     Patient Active Problem List   Diagnosis Date Noted   Fetal growth restriction: severe <1%(235 grams interval growth) normal UAD 08/07/2023   Class B DM(type 1) with pump + Omnipod:^10p-8a basal 1.4 u/hr, sens 1:5, ss 1/10>125 08/07/2023   Marginal insertion of umbilical cord: fundal 08/07/2023   Single umbilical artery affecting management of mother, antepartum, single gestation 05/18/2023   Rubella non-immune status, antepartum 03/02/2023   Supervision of high-risk pregnancy 02/28/2023   Chronic hypertension affecting pregnancy 02/28/2023   Uterine fibroids 02/28/2023   Obesity (BMI 30-39.9) 06/21/2021   H/O autologous stem cell transplant (HCC) 07/28/2020   T1DM (type 1 diabetes mellitus) (HCC) 06/11/2020   Multiple myeloma (HCC) 03/09/2020   Hypertension associated with type 1 diabetes mellitus (HCC) 05/24/2019    History of present pregnancy:  Last evaluation:  BPP today that 8/8. MD visit on 08/07/2023 with Dr. Katharine Look Vitals:    08/07/23 0913 08/07/23 0938  BP: (!) 134/94 (!) 150/103  Pulse: 91 (!) 106  Weight: 196 lb (88.9 kg)    Body mass index is 34.72 kg/m.             NURSING  PROVIDER  Office Location [facility] Dating by LMP c/w U/S at 8 wks  [facility] Model Traditional Anatomy U/S    Initiated care at  13wks                 Language  English               LAB RESULTS   Support Person   Genetics NIPS:   AFP:       NT/IT (FT only)  negative      Carrier Screen  Horizon:   Rhogam   N/a A1C/GTT Early:  Third trimester:   Flu Vaccine 05/01/23      TDaP Vaccine  07/17/23 Blood Type   Covid Vaccine   Antibody   RSV Vaccine   Rubella   Feeding Plan  bottle RPR   Contraception  POPs HBsAg   Circumcision   HIV   Pediatrician   discussed HCVAb   Prenatal Classes discussed          Pap       Diagnosis  Date Value Ref Range Status  02/18/2021     Final    - Negative for Intraepithelial Lesions or Malignancy (NILM)  02/18/2021 - Benign reactive/reparative changes   Final    BTL Consent   GC/CT Initial:  -/- 36wks:    VBAC Consent N/a GBS For PCN allergy, check sensitivities            DME Rx [ x] BP cuff [ ]  Weight Scale Waterbirth  [ ]  Class [ ]  Consent [ ]  CNM visit  PHQ9 & GAD7 [ x ] new OB [  ] 28 weeks  [  ] 36 weeks Induction  [ ]  Orders Entered [ ] Foley Y/N       OB History  Gravida  2   Para  0   Term  0   Preterm  0   AB  1   Living  0      SAB  1   IAB  0   Ectopic  0   Multiple  0   Live Births  0             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) 06/21/2021   Hypertension    Type 1 diabetes mellitus (HCC) 02/23/2020   Vitamin D deficiency 06/21/2021   Past Surgical History:  Procedure Laterality Date   BONE MARROW TRANSPLANT     NO PAST SURGERIES     Family History: family history includes Diabetes in her father, maternal grandfather, maternal grandmother, and paternal grandfather; Heart attack in her maternal grandfather; Hypertension in her father, maternal grandfather, maternal grandmother, mother, paternal grandfather, and paternal grandmother; Miscarriages / [place] in her mother; Other in her paternal grandmother; Stroke in her maternal grandmother. Social History:  reports that she has never smoked. She has never been exposed to tobacco  smoke. She has never used smokeless tobacco. She reports that she does not currently use alcohol. She reports that she does not use drugs.   Prenatal Transfer Tool  Maternal Diabetes: Yes:  Diabetes Type:  Pre-pregnancy, Insulin/Medication controlled Genetic Screening: Normal Maternal Ultrasounds/Referrals: IUGR Fetal Ultrasounds or other Referrals:  Referred to Materal Fetal Medicine  Maternal Substance Abuse:  No Significant Maternal Medications:  Meds include: Other:  Insulin, Procardia, Labetalol Significant Maternal Lab Results: Group B Strep negative   Maternal Assessment:  ROS: -Contractions, -LOF, -Vaginal Bleeding, +Fetal Movement  All other systems reviewed and negative.    No Known Allergies   Exam by::  Fredia Beets, RN) Height 5' 3.5" (1.613 m), weight 89.5 kg, last menstrual period 11/28/2022.  Physical Exam Vitals reviewed. Exam conducted with a chaperone present Elpidio Anis, RN).  Constitutional:      Appearance: Normal appearance.  HENT:     Head: Normocephalic and atraumatic.  Eyes:     Conjunctiva/sclera: Conjunctivae normal.  Cardiovascular:     Rate and Rhythm: Normal rate and regular rhythm.  Pulmonary:     Effort: Pulmonary effort is normal. No respiratory distress.  Genitourinary:    General: Normal vulva.     Comments: No lesions noted NEFG Speculum Exam with pink mucosa noted. Scant amt white discharge. Cervix appears slightly open.  Ring forcep inserted and cervix ~ 1.5 cm at external.  FB inserted without difficulty and infused with 60mL.  Musculoskeletal:        General: Normal range of motion.     Cervical back: Normal range of motion.  Skin:    General: Skin is warm and dry.  Neurological:     Mental Status: She is alert and oriented to person, place, and time.  Psychiatric:        Mood and Affect: Mood normal.        Behavior: Behavior normal.     Fetal Assessment: Leop[place]: -Pelvis: Adequate -EFW: 1559  grams -Presentation: Vertex by [place]  FHR: 145 bpm, Mod Var, -Decels, +Accels UCs:  No ctx graphed    Assessment IUP at 36.2 weeks Cat I FT IOL Severe FGR DM-Type I CHTN Marginal Cord Insertion Single Umbilical Cord Artery H/o Multiple Myeloma w/BM Rubella Non-Immune  Uterine Fibroids  Plan: Admit to [employer]  Routine Labor and Delivery Orders per Protocol In room to complete assessment and discuss POC: -Discussed foley bulb insertion including r/b, placement, associated discomfort, initiation of pitocin, and removal. -Patient agreeable and foley placed and infused with 60mL of water. -Will consider cytotec after expulsion or earlier as appropriate.  -Endotool ordered. -NPO now, but will consider advancement if NST remains reactive/reassuring.  -Procardia and Labetalol ordered for regular dosing. -Procardia also ordered for severe bp per protocol.  -Blood bank informed of patient arrival and need for radiated blood if requested.  -Diabetic coordinator s/p consult and note in chart. -L&D Team updated on patient status.  Joellyn Quails, MSN 08/09/2023, 5:14 PM

## 2023-08-09 NOTE — Inpatient Diabetes Management (Signed)
 Inpatient Diabetes Program Recommendations  Diabetes Treatment Program Recommendations  ADA Standards of Care Diabetes in Pregnancy Target Glucose Ranges:  Fasting: 70 - 95 mg/dL 1 hr postprandial: Less than 140mg /dL (from first bite of meal) 2 hr postprandial: Less than 120 mg/dL (from first bite of meal)    Lab Results  Component Value Date   GLUCAP 151 (H) 08/09/2023   HGBA1C 6.6 (A) 06/16/2023    Review of Glycemic Control  Latest Reference Range & Units 08/09/23 14:55  Glucose-Capillary 70 - 99 mg/dL 161 (H)  (H): Data is abnormally high Diabetes history: Type 1 DM Outpatient Diabetes medications: Omnipod- 1.4 units/hr, CR 1:5, TG 110, SF 1:25 Prior to Pregnancy: Lantus 35 units every day, Novolog 6-16 units TID Current orders for Inpatient glycemic control:  IV insulin  Inpatient Diabetes Program Recommendations:    Called to speak with patient regarding outpatient diabetes management. Patient is followed by Ronny Bacon, outpatient endocrinology.  Patient verified rates have not changed in her pump since last visit on 06/16/23. However, patient did report that she removed insulin pump around 0200 and has not had any additional insulin since that time.  Reviewed patient's previous A1c of 6.6%. Explained what a A1c is and what it measures. Also reviewed goal A1c with patient, importance of good glucose control @ home, and blood sugar goals. Reviewed patho of DM in pregnancy, insulin resistance changes that come with delivery, survival skills, interventions, vascular changes and commorbidities.  Patient is currently wearing a Dexcom and CBG reads 166 mg/dL. Patient just had lunch and is on the way to the hospital during conversation.   In preparation for induction- Consider: IV insulin and adding CMP  Following delivery-  - Lantus 15 units two hours prior to discontinuation of IV insulin then every day to follow - Novolog 0-6 units TID & HS - Novolog 3 units TID (assuming  patient is consuming >50% of meal)  -Anticipate need for titration following initial doses. Secure chat sent to Sabas Sous, CNM.   Thanks, Lujean Rave, MSN, RNC-OB Diabetes Coordinator [phone] (8a-5p)

## 2023-08-09 NOTE — Progress Notes (Signed)
 Called Blood Bank to let them know pt would need radiated blood due to hx of bone marrow transplant. They are making a note on their end.

## 2023-08-09 NOTE — Progress Notes (Signed)
 [place]: GA = 36+2 weeks Single active female fetus, cephalic, FHR = 152 bpm  AFI = 8.7 cm, 8.9%, MVP = 2.3 cm BPP = 8/8, excellent respirations observed - RI: 0.85, 0.83, 081  avg RI = 0.83 > 98% S/D = 5.96  PI = 1.56  -  no evidence of absent end diastolic flow, no evidence of reverse flow

## 2023-08-09 NOTE — Progress Notes (Signed)
 18 G IV in left hand is for fluids and medications.  20 G IV in left hand is for Endotool.

## 2023-08-09 NOTE — Progress Notes (Signed)
 Handed off to [employer] and [place], Rns. Insulin running at 1.4

## 2023-08-09 NOTE — Progress Notes (Signed)
 Dr. Renold Don is okay with both IVs close together in left hand.

## 2023-08-10 ENCOUNTER — Inpatient Hospital Stay (HOSPITAL_COMMUNITY): Payer: Self-pay | Admitting: Anesthesiology

## 2023-08-10 ENCOUNTER — Encounter (HOSPITAL_COMMUNITY): Payer: Self-pay | Admitting: Obstetrics & Gynecology

## 2023-08-10 ENCOUNTER — Other Ambulatory Visit: Payer: Medicaid Other

## 2023-08-10 DIAGNOSIS — O1002 Pre-existing essential hypertension complicating childbirth: Secondary | ICD-10-CM

## 2023-08-10 DIAGNOSIS — O2412 Pre-existing diabetes mellitus, type 2, in childbirth: Secondary | ICD-10-CM

## 2023-08-10 DIAGNOSIS — O4423 Partial placenta previa NOS or without hemorrhage, third trimester: Secondary | ICD-10-CM

## 2023-08-10 DIAGNOSIS — O99214 Obesity complicating childbirth: Secondary | ICD-10-CM

## 2023-08-10 DIAGNOSIS — Z3A36 36 weeks gestation of pregnancy: Secondary | ICD-10-CM

## 2023-08-10 DIAGNOSIS — O365930 Maternal care for other known or suspected poor fetal growth, third trimester, not applicable or unspecified: Secondary | ICD-10-CM

## 2023-08-10 DIAGNOSIS — O365990 Maternal care for other known or suspected poor fetal growth, unspecified trimester, not applicable or unspecified: Secondary | ICD-10-CM | POA: Diagnosis not present

## 2023-08-10 LAB — CBC
HCT: 38.1 % (ref 36.0–46.0)
Hemoglobin: 13 g/dL (ref 12.0–15.0)
MCH: 29 pg (ref 26.0–34.0)
MCHC: 34.1 g/dL (ref 30.0–36.0)
MCV: 85 fL (ref 80.0–100.0)
Platelets: 272 10*3/uL (ref 150–400)
RBC: 4.48 MIL/uL (ref 3.87–5.11)
RDW: 13 % (ref 11.5–15.5)
WBC: 6.1 10*3/uL (ref 4.0–10.5)
nRBC: 0 % (ref 0.0–0.2)

## 2023-08-10 LAB — GLUCOSE, CAPILLARY
Glucose-Capillary: 107 mg/dL — ABNORMAL HIGH (ref 70–99)
Glucose-Capillary: 96 mg/dL (ref 70–99)

## 2023-08-10 LAB — RPR: RPR Ser Ql: NONREACTIVE

## 2023-08-10 MED ORDER — INSULIN ASPART 100 UNIT/ML IJ SOLN
3.0000 [IU] | Freq: Three times a day (TID) | INTRAMUSCULAR | Status: DC
  Administered 2023-08-11 – 2023-08-12 (×4): 3 [IU] via SUBCUTANEOUS

## 2023-08-10 MED ORDER — LORATADINE 10 MG PO TABS
10.0000 mg | ORAL_TABLET | Freq: Every day | ORAL | Status: DC
  Filled 2023-08-10 (×2): qty 1

## 2023-08-10 MED ORDER — DIPHENHYDRAMINE HCL 50 MG/ML IJ SOLN: 12.5000 mg | INTRAMUSCULAR | Status: DC | PRN

## 2023-08-10 MED ORDER — EPHEDRINE 5 MG/ML INJ: 10.0000 mg | INTRAVENOUS | Status: DC | PRN

## 2023-08-10 MED ORDER — NIFEDIPINE ER OSMOTIC RELEASE 30 MG PO TB24
30.0000 mg | ORAL_TABLET | Freq: Every day | ORAL | Status: DC
  Administered 2023-08-11 – 2023-08-12 (×2): 30 mg via ORAL
  Filled 2023-08-10 (×2): qty 1

## 2023-08-10 MED ORDER — FENTANYL-BUPIVACAINE-NACL 0.5-0.125-0.9 MG/250ML-% EP SOLN
EPIDURAL | Status: AC
Start: 2023-08-10 — End: 2023-08-10
  Filled 2023-08-10: qty 250

## 2023-08-10 MED ORDER — FERROUS SULFATE 325 (65 FE) MG PO TABS
325.0000 mg | ORAL_TABLET | Freq: Every day | ORAL | Status: DC
Start: 2023-08-11 — End: 2023-08-12
  Administered 2023-08-11 – 2023-08-12 (×2): 325 mg via ORAL
  Filled 2023-08-10 (×2): qty 1

## 2023-08-10 MED ORDER — VITAMIN D 25 MCG (1000 UNIT) PO TABS
1000.0000 [IU] | ORAL_TABLET | Freq: Every day | ORAL | Status: DC
  Administered 2023-08-11 – 2023-08-12 (×2): 1000 [IU] via ORAL
  Filled 2023-08-10 (×3): qty 1

## 2023-08-10 MED ORDER — FENTANYL-BUPIVACAINE-NACL 0.5-0.125-0.9 MG/250ML-% EP SOLN
12.0000 mL/h | EPIDURAL | Status: DC | PRN
  Administered 2023-08-10: 12 mL/h via EPIDURAL

## 2023-08-10 MED ORDER — LABETALOL HCL 100 MG PO TABS: 100.0000 mg | ORAL_TABLET | Freq: Two times a day (BID) | ORAL | Status: DC

## 2023-08-10 MED ORDER — INSULIN ASPART 100 UNIT/ML IJ SOLN
0.0000 [IU] | Freq: Three times a day (TID) | INTRAMUSCULAR | Status: DC
  Administered 2023-08-11: 1 [IU] via SUBCUTANEOUS
  Administered 2023-08-11: 2 [IU] via SUBCUTANEOUS

## 2023-08-10 MED ORDER — LACTATED RINGERS IV SOLN: 500.0000 mL | Freq: Once | INTRAVENOUS | Status: DC

## 2023-08-10 MED ORDER — FLUTICASONE PROPIONATE 50 MCG/ACT NA SUSP
2.0000 | Freq: Every day | NASAL | Status: DC
  Filled 2023-08-10: qty 16

## 2023-08-10 MED ORDER — INSULIN GLARGINE-YFGN 100 UNIT/ML ~~LOC~~ SOLN
15.0000 [IU] | Freq: Every day | SUBCUTANEOUS | Status: DC
  Administered 2023-08-10 – 2023-08-11 (×2): 15 [IU] via SUBCUTANEOUS
  Filled 2023-08-10 (×3): qty 0.15

## 2023-08-10 MED ORDER — PHENYLEPHRINE 80 MCG/ML (10ML) SYRINGE FOR IV PUSH (FOR BLOOD PRESSURE SUPPORT): 80.0000 ug | PREFILLED_SYRINGE | INTRAVENOUS | Status: DC | PRN

## 2023-08-10 MED ORDER — LIDOCAINE HCL (PF) 1 % IJ SOLN
INTRAMUSCULAR | Status: DC | PRN
  Administered 2023-08-10: 8 mL via EPIDURAL

## 2023-08-10 NOTE — Progress Notes (Signed)
 LABOR PROGRESS NOTE  Patient Name: Diamond Robbins, female   DOB: 1991-03-29, 33 y.o.  MRN: [id number]  Patient amenable to check. Found to be 4/50/-3 very posterior. Did not feel forebag. Will continue to titrate pit up. Cat I tracing currently. Rare variable. Sugars to goal on endotool. Normotensive to mild range pressures.  Joanne Gavel, MD

## 2023-08-10 NOTE — Progress Notes (Addendum)
 Labor Progress Note Diamond Robbins is a 33 y.o. G2P0010 at [redacted]w[redacted]d who presented for IOL for FGR.  S: Patient reports she is doing well, no pain from contractions.  O:  BP (!) 143/93   Pulse 85   Temp 97.9 F (36.6 C) (Oral)   Resp 16   Ht 5' 3.5" (1.613 m)   Wt 89.5 kg   LMP 11/28/2022   BMI 34.42 kg/m  FHT: 145bpm baseline, moderate variability, + accels, variable and late decels CTX: uterine irritability on toco, was q38min for period of time  CVE: Dilation: 4 Effacement (%): 50 Station: -3 Presentation: Vertex Exam by:: Dr. Bea Laura   A&P:   Diamond Robbins is a 33 y.o. G2P0010 at [redacted]w[redacted]d who presented for IOL for FGR.  --IOL: Most recent SVE 4/50/-3 at 06:11. Plan to continue pitocin (currently at 52mL/hr). Consider IUPC and amnioinfusion if persistent or recurrent variable decels.         --Suspected SROM         --S/p FB         --Plan for epidural --FWB: Category II due to variable and late decels but remains overall reassuring. Continue to monitor. --FGR: Severe with normal UAD. [place] at [redacted]w[redacted]d showing EFW 1559g (0%) --T1DM: Insulin via endotool. Plan to monitor CBGs throughout labor course. S/p diabetes team consult with plan for PP insulin as follows:         --Lantus 22u 2 hours prior to discontinuation of IV insulin then 22u daily         --Novolog 0-6u TIDHS         --Novolog 3u TID (assuming patient consumes > 50% of meals) --cHTN: MRBPs. Asymptomatic, BP labs WNL. Continue to monitor.         --Scheduled: nifedipine 30mg  daily, labetalol 100mg  BID         --PRN: nifedipine, labetalol --Marginal cord insertion --Single umbilical artery --RNI: Plan to offer MMR vaccine PP --Uterine fibroids: Not located in lower uterine segment --Hx multiple myeloma with autologous stem cell transplant. Blood bank informed of need for radiated blood if requested.  --Rh pos/GBS neg/boy (?)/bottle/POPs   Thereasa Solo, MD 9:03 AM

## 2023-08-10 NOTE — Plan of Care (Signed)
 Problem: Education: Goal: Knowledge of General Education information will improve Description: Including pain rating scale, medication(s)/side effects and non-pharmacologic comfort measures Outcome: Completed/Met   Problem: Health Behavior/Discharge Planning: Goal: Ability to manage health-related needs will improve Outcome: Completed/Met   Problem: Clinical Measurements: Goal: Ability to maintain clinical measurements within normal limits will improve Outcome: Completed/Met Goal: Will remain free from infection Outcome: Completed/Met Goal: Diagnostic test results will improve Outcome: Completed/Met Goal: Respiratory complications will improve Outcome: Completed/Met Goal: Cardiovascular complication will be avoided Outcome: Completed/Met   Problem: Activity: Goal: Risk for activity intolerance will decrease Outcome: Completed/Met   Problem: Nutrition: Goal: Adequate nutrition will be maintained Outcome: Completed/Met   Problem: Coping: Goal: Level of anxiety will decrease Outcome: Completed/Met   Problem: Elimination: Goal: Will not experience complications related to bowel motility Outcome: Completed/Met Goal: Will not experience complications related to urinary retention Outcome: Completed/Met   Problem: Pain Managment: Goal: General experience of comfort will improve and/or be controlled Outcome: Completed/Met   Problem: Safety: Goal: Ability to remain free from injury will improve Outcome: Completed/Met   Problem: Skin Integrity: Goal: Risk for impaired skin integrity will decrease Outcome: Completed/Met   Problem: Education: Goal: Ability to describe self-care measures that may prevent or decrease complications (Diabetes Survival Skills Education) will improve Outcome: Completed/Met Goal: Individualized Educational Video(s) Outcome: Completed/Met   Problem: Coping: Goal: Ability to adjust to condition or change in health will improve Outcome:  Completed/Met   Problem: Fluid Volume: Goal: Ability to maintain a balanced intake and output will improve Outcome: Completed/Met   Problem: Health Behavior/Discharge Planning: Goal: Ability to identify and utilize available resources and services will improve Outcome: Completed/Met Goal: Ability to manage health-related needs will improve Outcome: Completed/Met   Problem: Metabolic: Goal: Ability to maintain appropriate glucose levels will improve Outcome: Completed/Met   Problem: Nutritional: Goal: Maintenance of adequate nutrition will improve Outcome: Completed/Met Goal: Progress toward achieving an optimal weight will improve Outcome: Completed/Met   Problem: Skin Integrity: Goal: Risk for impaired skin integrity will decrease Outcome: Completed/Met   Problem: Tissue Perfusion: Goal: Adequacy of tissue perfusion will improve Outcome: Completed/Met   Problem: Education: Goal: Knowledge of Childbirth will improve Outcome: Completed/Met Goal: Ability to make informed decisions regarding treatment and plan of care will improve Outcome: Completed/Met Goal: Ability to state and carry out methods to decrease the pain will improve Outcome: Completed/Met Goal: Individualized Educational Video(s) Outcome: Completed/Met   Problem: Coping: Goal: Ability to verbalize concerns and feelings about labor and delivery will improve Outcome: Completed/Met   Problem: Life Cycle: Goal: Ability to make normal progression through stages of labor will improve Outcome: Completed/Met Goal: Ability to effectively push during vaginal delivery will improve Outcome: Completed/Met   Problem: Role Relationship: Goal: Will demonstrate positive interactions with the child Outcome: Completed/Met   Problem: Education: Goal: Ability to describe self-care measures that may prevent or decrease complications (Diabetes Survival Skills Education) will improve Outcome: Completed/Met Goal:  Individualized Educational Video(s) Outcome: Completed/Met   Problem: Coping: Goal: Ability to adjust to condition or change in health will improve Outcome: Completed/Met   Problem: Fluid Volume: Goal: Ability to maintain a balanced intake and output will improve Outcome: Completed/Met   Problem: Health Behavior/Discharge Planning: Goal: Ability to identify and utilize available resources and services will improve Outcome: Completed/Met Goal: Ability to manage health-related needs will improve Outcome: Completed/Met   Problem: Metabolic: Goal: Ability to maintain appropriate glucose levels will improve Outcome: Completed/Met   Problem: Nutritional: Goal: Maintenance of adequate nutrition will  improve Outcome: Completed/Met Goal: Progress toward achieving an optimal weight will improve Outcome: Completed/Met   Problem: Skin Integrity: Goal: Risk for impaired skin integrity will decrease Outcome: Completed/Met   Problem: Tissue Perfusion: Goal: Adequacy of tissue perfusion will improve Outcome: Completed/Met

## 2023-08-10 NOTE — Progress Notes (Signed)
 Labor Progress Note Diamond Robbins is a 33 y.o. G2P0010 at [redacted]w[redacted]d presented for IOL for FGR with elevated dopplers S: Coping well with foley balloon. Not contracting nor cramping.   O:  BP 131/88   Pulse 85   Temp 97.9 F (36.6 C) (Oral)   Resp 18   Ht 5' 3.5" (1.613 m)   Wt 89.5 kg   LMP 11/28/2022   BMI 34.42 kg/m   EFM: baseline 145, accels, no decels, moderate variability TOCO: no contractions  CVE: Dilation: 1.5 Effacement (%): Thick Presentation: Vertex Exam by:: Gerrit Heck, CNM   A&P: 33 y.o. G2P0010 [redacted]w[redacted]d admitted for IOL #Labor: FB remains in place with minimal contractions. Will start pitocin 2x2.  #Pain: Per pt request #FWB: Cat I #GBS negative  #T1DM on Endotool CBG (last 3)  Recent Labs    08/09/23 1646 08/09/23 1750 08/09/23 1851  GLUCAP 115* 116* 111*   #CHTN - negative labs; mild range Bps  #Rubella non-immune - offer postpartum MMR  Wyn Forster, MD 2:54 AM

## 2023-08-10 NOTE — Progress Notes (Signed)
 Labor Progress Note Diamond Robbins is a 34 y.o. G2P0010 at [redacted]w[redacted]d presented for IOL for FGR S: Coping well. Foley balloon out. Possible SROM, clear.   O:  BP 118/69   Pulse 89   Temp 98 F (36.7 C) (Oral)   Resp 18   Ht 5' 3.5" (1.613 m)   Wt 89.5 kg   LMP 11/28/2022   BMI 34.42 kg/m   EFM: baseline 140, accels, intermittent deep variable decels, moderate variability TOCO: no contractions  CVE: Dilation: 4 Effacement (%): 50 Station: -3 Presentation: Vertex Exam by:: Dr. Bea Laura   A&P: 33 y.o. G2P0010 [redacted]w[redacted]d admitted for IOL #Labor: Progressing well. FB out. Suspect SROM. Continue to titrate pitocin as able. Consider IUPC and amnioinfusion for persistent or recurrent variables #Pain: Coping well. Aware of options #FWB: Cat II; overall reassuring #GBS negative  T1DM - CBG (last 3)  Recent Labs    08/09/23 1646 08/09/23 1750 08/09/23 1851  GLUCAP 115* 116* 111*   Continue endotool - @2 .4u/hr  CHTN - mild range BP; normal labs   Wyn Forster, MD 6:17 AM

## 2023-08-10 NOTE — Inpatient Diabetes Management (Signed)
 Inpatient Diabetes Program Recommendations  Diabetes Treatment Program Recommendations  ADA Standards of Care Diabetes in Pregnancy Target Glucose Ranges:  Fasting: 70 - 95 mg/dL 1 hr postprandial: Less than 140mg /dL (from first bite of meal) 2 hr postprandial: Less than 120 mg/dL (from first bite of meal)    Lab Results  Component Value Date   GLUCAP 107 (H) 08/10/2023   HGBA1C 6.6 (A) 06/16/2023    Review of Glycemic Control  Latest Reference Range & Units 08/09/23 16:46 08/09/23 17:50 08/09/23 18:51 08/10/23 06:48  Glucose-Capillary 70 - 99 mg/dL 629 (H) 528 (H) 413 (H) 107 (H)  (H): Data is abnormally high Diabetes history: Type 1 DM Outpatient Diabetes medications: Omnipod- 1.4 units/hr, CR 1:5, TG 110, SF 1:25 Prior to Pregnancy: Lantus 35 units every day, Novolog 6-16 units TID Current orders for Inpatient glycemic control:  IV insulin   Inpatient Diabetes Program Recommendations:    Trending well, continue with IV insulin.   Noted orders for insulin pump- would recommend discontinuing. Plan for SQ insulin following delivery per patient discussion from 3/26.   Following delivery-  - Lantus 22 units two hours prior to discontinuation of IV insulin then every day to follow - Novolog 0-6 units TID & HS - Novolog 3 units TID (assuming patient is consuming >50% of meal)  Thanks, Lujean Rave, MSN, RNC-OB Diabetes Coordinator [phone] (8a-5p)

## 2023-08-10 NOTE — Progress Notes (Signed)
 LABOR PROGRESS NOTE  Patient Name: Diamond Robbins, female   DOB: 04/17/1991, 33 y.o.  MRN: [id number]  Patient requesting fentanyl for pain. Feeling regular, painful contractions now. Fentanyl given. She is amenable to check. Cervix 5/70/-3. Discussed rupture of forebag. R/B/A of AROM discussed with patient, and verbal consent obtained. Babe's head found to be well-applied. Obtained  small amount of  clear fluid. Mom and babe tolerated well with bloody show. Cat II for shallow variables.  Joanne Gavel, MD

## 2023-08-10 NOTE — Anesthesia Procedure Notes (Signed)
 Epidural Patient location during procedure: OB Start time: 08/10/2023 5:35 PM End time: 08/10/2023 5:45 PM  Staffing Anesthesiologist: Lannie Fields, DO Performed: anesthesiologist   Preanesthetic Checklist Completed: patient identified, IV checked, risks and benefits discussed, monitors and equipment checked, pre-op evaluation and timeout performed  Epidural Patient position: sitting Prep: DuraPrep and site prepped and draped Patient monitoring: continuous pulse ox, blood pressure, heart rate and cardiac monitor Approach: midline Location: L3-L4 Injection technique: LOR air  Needle:  Needle type: Tuohy  Needle gauge: 17 G Needle length: 9 cm Needle insertion depth: 7.5 cm Catheter type: closed end flexible Catheter size: 19 Gauge Catheter at skin depth: 12 cm Test dose: negative  Assessment Sensory level: T8 Events: blood not aspirated, no cerebrospinal fluid, injection not painful, no injection resistance, no paresthesia and negative IV test  Additional Notes Patient identified. Risks/Benefits/Options discussed with patient including but not limited to bleeding, infection, nerve damage, paralysis, failed block, incomplete pain control, headache, blood pressure changes, nausea, vomiting, reactions to medication both or allergic, itching and postpartum back pain. Confirmed with bedside nurse the patient's most recent platelet count. Confirmed with patient that they are not currently taking any anticoagulation, have any bleeding history or any family history of bleeding disorders. Patient expressed understanding and wished to proceed. All questions were answered. Sterile technique was used throughout the entire procedure. Please see nursing notes for vital signs. Test dose was given through epidural catheter and negative prior to continuing to dose epidural or start infusion. Warning signs of high block given to the patient including shortness of breath, tingling/numbness in  hands, complete motor block, or any concerning symptoms with instructions to call for help. Patient was given instructions on fall risk and not to get out of bed. All questions and concerns addressed with instructions to call with any issues or inadequate analgesia.  Reason for block:procedure for pain

## 2023-08-10 NOTE — Progress Notes (Signed)
 Chaplain responded to a consult request for Advance Directive education. Diamond Robbins is still in labor and defers completing documents at this time, but would like to do so before discharge. She has these documents from a hospitalization in [place], but would like to update for [place]. Chaplain provided the documents as well as the following education.  Chaplain provided the Advance Directive packet as well as education on Advance Directives-documents an individual completes to communicate their health care directions in advance of a time when they may need them. Chaplain informed pt the documents which may be completed here in the hospital are the Living Will and Health Care Power of [place].   Chaplain informed that the Health Care Power of [place] is a legal document in which an individual names another person, their Health Care Agent, to make health care decisions when the individual is not able to make them for themselves. The Health Care Agent's function can be temporary or permanent depending on the pt's ability to make and communicate those decisions independently. Chaplain informed pt in the absence of a Health Care Power of [place], the state of [place] directs health care providers to look to the following individuals in the order listed: legal guardian; an attorney?in?fact under a general power of attorney (POA) if that POA includes the right to make health care decisions; a husband or wife; a majority of parents and adult children; a majority of adult brothers and sisters; or an individual who has an established relationship with you, who is acting in good faith and who can convey your wishes.  If none of these person are available or willing to make medical decisions on a patient's behalf, the law allows the patient's doctor to make decisions for them as long as another doctor agrees with those decisions.  Chaplain also informed the patient that the Health Care agent has no decision-making authority  over any affairs other than those related to his or her medical care.   The chaplain further educated the pt that a Living Will is a legal document that allows an individual to state his or her desire not to receive life-prolonging measures in the event that they have a condition that is incurable and will result in their death in a short period of time; they are unconscious, and doctors are confident that they will not regain consciousness; and/or they have advanced dementia or other substantial and irreversible loss of mental function. The chaplain informed pt that life-prolonging measures are medical treatments that would only serve to postpone death, including breathing machines, kidney dialysis, antibiotics, artificial nutrition and hydration (tube feeding), and similar forms of treatment and that if an individual is able to express their wishes, they may also make them known without the use of a Living Will, but in the event that an individual is not able to express their wishes themselves, a Living Will allows medical providers and the pt's family and friends ensure that they are not making decisions on the pt's behalf, but rather serving as the pt's voice to convey decisions the pt has already made.   The patient is aware that the decision to create an advance directive is theirs alone and they may chose not to complete the documents or may chose to complete one portion or both.  The patient was informed that they can revoke the documents at any time by striking through them and writing void or by completing new documents, but that it is also advisable that the individual verbally  notify interested parties that their wishes have changed.  They are also aware that the document must be signed in the presence of a notary public and two witnesses and that this can be done while the patient is still admitted to the hospital or after discharge in the community. If they decide to complete Advance Directives  after being discharged from the hospital, they have been advised to notify all interested parties and to provide those documents to their physicians and loved ones in addition to bringing them to the hospital in the event of another hospitalization.   The chaplain informed the pt that if they desire to proceed with completing Advance Directive Documentation while they are still admitted, notary services are typically available at [facility] between the hours of 1:00 and 3:30 Monday-Thursday.    When the patient is ready to have these documents completed, the patient should request that their nurse place a spiritual care consult and indicate that the patient is ready to have their advance directives notarized so that arrangements for witnesses and notary public can be made.  Please page spiritual care if the patient desires further education or has questions.     Please page as further needs arise.  Maryanna Shape. Carley Hammed, M.Div. [facility] Chaplain Pager [phone] Office [phone]

## 2023-08-10 NOTE — Lactation Note (Signed)
 This note was copied from a baby's chart. Lactation Consultation Note  Patient Name: Diamond Robbins [id number] Date: 08/10/2023 Age:33 hours  Mom chooses to formula feed baby.   Maternal Data    Feeding Nipple Type: Nfant Extra Slow Flow (gold)  LATCH Score                    Lactation Tools Discussed/Used    Interventions    Discharge    Consult Status Consult Status: Complete    Mikie Misner G 08/10/2023, 9:23 PM

## 2023-08-11 LAB — CBC
HCT: 37.8 % (ref 36.0–46.0)
Hemoglobin: 12.8 g/dL (ref 12.0–15.0)
MCH: 28.8 pg (ref 26.0–34.0)
MCHC: 33.9 g/dL (ref 30.0–36.0)
MCV: 85.1 fL (ref 80.0–100.0)
Platelets: 278 10*3/uL (ref 150–400)
RBC: 4.44 MIL/uL (ref 3.87–5.11)
RDW: 12.9 % (ref 11.5–15.5)
WBC: 10.4 10*3/uL (ref 4.0–10.5)
nRBC: 0 % (ref 0.0–0.2)

## 2023-08-11 MED ORDER — PRENATAL MULTIVITAMIN CH
1.0000 | ORAL_TABLET | Freq: Every day | ORAL | Status: DC
  Administered 2023-08-11: 1 via ORAL
  Filled 2023-08-11: qty 1

## 2023-08-11 MED ORDER — ONDANSETRON HCL 4 MG/2ML IJ SOLN: 4.0000 mg | INTRAMUSCULAR | Status: DC | PRN

## 2023-08-11 MED ORDER — LABETALOL HCL 200 MG PO TABS
200.0000 mg | ORAL_TABLET | Freq: Two times a day (BID) | ORAL | Status: DC
  Administered 2023-08-11 – 2023-08-12 (×3): 200 mg via ORAL
  Filled 2023-08-11 (×3): qty 1

## 2023-08-11 MED ORDER — SIMETHICONE 80 MG PO CHEW: 80.0000 mg | CHEWABLE_TABLET | ORAL | Status: DC | PRN

## 2023-08-11 MED ORDER — MEASLES, MUMPS & RUBELLA VAC IJ SOLR: 0.5000 mL | Freq: Once | INTRAMUSCULAR | Status: DC

## 2023-08-11 MED ORDER — KETOROLAC TROMETHAMINE 30 MG/ML IJ SOLN
INTRAMUSCULAR | Status: AC
  Filled 2023-08-11: qty 1

## 2023-08-11 MED ORDER — OXYCODONE HCL 5 MG PO TABS: 5.0000 mg | ORAL_TABLET | ORAL | Status: DC | PRN

## 2023-08-11 MED ORDER — COCONUT OIL OIL: 1.0000 | TOPICAL_OIL | Status: DC | PRN

## 2023-08-11 MED ORDER — TETANUS-DIPHTH-ACELL PERTUSSIS 5-2.5-18.5 LF-MCG/0.5 IM SUSY: 0.5000 mL | PREFILLED_SYRINGE | Freq: Once | INTRAMUSCULAR | Status: DC

## 2023-08-11 MED ORDER — DIBUCAINE (PERIANAL) 1 % EX OINT: 1.0000 | TOPICAL_OINTMENT | CUTANEOUS | Status: DC | PRN

## 2023-08-11 MED ORDER — BENZOCAINE-MENTHOL 20-0.5 % EX AERO: 1.0000 | INHALATION_SPRAY | CUTANEOUS | Status: DC | PRN

## 2023-08-11 MED ORDER — SODIUM CHLORIDE 0.9 % IV SOLN: 250.0000 mL | INTRAVENOUS | Status: DC | PRN

## 2023-08-11 MED ORDER — WITCH HAZEL-GLYCERIN EX PADS: 1.0000 | MEDICATED_PAD | CUTANEOUS | Status: DC | PRN

## 2023-08-11 MED ORDER — ZOLPIDEM TARTRATE 5 MG PO TABS: 5.0000 mg | ORAL_TABLET | Freq: Every evening | ORAL | Status: DC | PRN

## 2023-08-11 MED ORDER — SODIUM CHLORIDE 0.9% FLUSH
3.0000 mL | Freq: Two times a day (BID) | INTRAVENOUS | Status: DC
  Administered 2023-08-11 (×2): 3 mL via INTRAVENOUS

## 2023-08-11 MED ORDER — SENNOSIDES-DOCUSATE SODIUM 8.6-50 MG PO TABS
2.0000 | ORAL_TABLET | ORAL | Status: DC
  Administered 2023-08-12: 2 via ORAL
  Filled 2023-08-11 (×2): qty 2

## 2023-08-11 MED ORDER — FUROSEMIDE 20 MG PO TABS
20.0000 mg | ORAL_TABLET | Freq: Every day | ORAL | Status: DC
  Administered 2023-08-11 – 2023-08-12 (×2): 20 mg via ORAL
  Filled 2023-08-11 (×2): qty 1

## 2023-08-11 MED ORDER — ONDANSETRON HCL 4 MG PO TABS: 4.0000 mg | ORAL_TABLET | ORAL | Status: DC | PRN

## 2023-08-11 MED ORDER — ACETAMINOPHEN 325 MG PO TABS: 650.0000 mg | ORAL_TABLET | ORAL | Status: DC | PRN

## 2023-08-11 MED ORDER — DIPHENHYDRAMINE HCL 25 MG PO CAPS: 25.0000 mg | ORAL_CAPSULE | Freq: Four times a day (QID) | ORAL | Status: DC | PRN

## 2023-08-11 MED ORDER — IBUPROFEN 600 MG PO TABS
600.0000 mg | ORAL_TABLET | Freq: Four times a day (QID) | ORAL | Status: DC
  Administered 2023-08-11 – 2023-08-12 (×4): 600 mg via ORAL
  Filled 2023-08-11 (×4): qty 1

## 2023-08-11 MED ORDER — SODIUM CHLORIDE 0.9% FLUSH: 3.0000 mL | INTRAVENOUS | Status: DC | PRN

## 2023-08-11 NOTE — Progress Notes (Signed)
 POSTPARTUM PROGRESS NOTE  PPD #1  Subjective:  Diamond Robbins is a 33 y.o. [id number] s/p NSVD at [redacted]w[redacted]d. Today she notes she is doing well. She denies any problems with ambulating, voiding or po intake. Denies nausea or vomiting. She has not yet passed flatus, no BM.  Pain is well controlled.  Lochia appropriate Denies fever/chills/chest pain/SOB.  no HA, no blurry vision, no RUQ pain  Objective: Blood pressure (!) 138/97, pulse 79, temperature (!) 97.4 F (36.3 C), temperature source Oral, resp. rate 18, height 5' 3.5" (1.613 m), weight 89.5 kg, last menstrual period 11/28/2022, SpO2 99%, unknown if currently breastfeeding.  Physical Exam:  General: alert, cooperative and no distress Chest: no respiratory distress Heart: regular rate and rhythm Abdomen: soft, nontender Uterine Fundus: firm, appropriately tender Incision: NA DVT Evaluation: No calf swelling or tenderness Extremities: no edema Skin: warm, dry  Results for orders placed or performed during the hospital encounter of 08/09/23 (from the past 24 hours)  CBC     Status: None   Collection Time: 08/10/23  4:50 PM  Result Value Ref Range   WBC 6.1 4.0 - 10.5 K/uL   RBC 4.48 3.87 - 5.11 MIL/uL   Hemoglobin 13.0 12.0 - 15.0 g/dL   HCT 95.2 84.1 - 32.4 %   MCV 85.0 80.0 - 100.0 fL   MCH 29.0 26.0 - 34.0 pg   MCHC 34.1 30.0 - 36.0 g/dL   RDW 40.1 02.7 - 25.3 %   Platelets 272 150 - 400 K/uL   nRBC 0.0 0.0 - 0.2 %  Glucose, capillary     Status: None   Collection Time: 08/10/23  6:58 PM  Result Value Ref Range   Glucose-Capillary 96 70 - 99 mg/dL  CBC     Status: None   Collection Time: 08/11/23  5:11 AM  Result Value Ref Range   WBC 10.4 4.0 - 10.5 K/uL   RBC 4.44 3.87 - 5.11 MIL/uL   Hemoglobin 12.8 12.0 - 15.0 g/dL   HCT 66.4 40.3 - 47.4 %   MCV 85.1 80.0 - 100.0 fL   MCH 28.8 26.0 - 34.0 pg   MCHC 33.9 30.0 - 36.0 g/dL   RDW 25.9 56.3 - 87.5 %   Platelets 278 150 - 400 K/uL   nRBC 0.0 0.0 - 0.2 %     Assessment/Plan: Diamond Robbins is a 33 y.o. [id number] s/p NSVD at [redacted]w[redacted]d PPD#1 complicated by: 1) Chronic HTN Asymptomatic Increased Labetalol to 200mg  bid, continue ProcardiaXL 30mg  daily, added Lasix  2) Type 1 DM -currently on Lantus 15u at night -sliding scale for meal coverage -plan to follow up with endocrinology PP  3) postpartum care -meeting milestones appropriately  Contraception: POPs Feeding: in NICU, plans on bottle feeding  Dispo: Continue postpartum care as above, plan for discharge home PPD#2   LOS: 2 days   Myna Hidalgo, DO Faculty Attending, Center for [facility] 08/11/2023, 9:37 AM

## 2023-08-11 NOTE — Anesthesia Postprocedure Evaluation (Signed)
 Anesthesia Post Note  Patient: Diamond Robbins  Procedure(s) Performed: AN AD HOC LABOR EPIDURAL     Patient location during evaluation: Mother Baby Anesthesia Type: Epidural Level of consciousness: awake and alert and oriented Pain management: satisfactory to patient Vital Signs Assessment: post-procedure vital signs reviewed and stable Respiratory status: respiratory function stable Cardiovascular status: stable Postop Assessment: no headache, no backache, epidural receding, patient able to bend at knees, no signs of nausea or vomiting, adequate PO intake and able to ambulate Anesthetic complications: no   No notable events documented.  Last Vitals:  Vitals:   08/11/23 0518 08/11/23 0743  BP: (!) 148/103 (!) 138/97  Pulse: 96 79  Resp: 19 18  Temp: 36.7 C (!) 36.3 C  SpO2: 98% 99%    Last Pain:  Vitals:   08/11/23 0743  TempSrc: Oral  PainSc:    Pain Goal:                   [name]

## 2023-08-11 NOTE — Lactation Note (Signed)
 This note was copied from a baby's chart.  NICU Lactation Consultation Note  Patient Name: Diamond Robbins [id number] Date: 08/11/2023 Age:33 years  Reason for consult: Initial assessment; NICU baby; Infant < 5lbs; Primapara; 1st time breastfeeding; Maternal endocrine disorder; Other (Comment); Late-preterm 34-36.6wks (severe IUGR) Type of Endocrine Disorder?: Diabetes (T1DM)  SUBJECTIVE Visited with family of 33 88/59 weeks old AGA NICU female; Ms. Crespo is a P1 and confirmed that her plan is to do just formula once baby "Diamond Robbins" is no longer on donor milk. Let her know that her milk will still come in, reviewed strategies for lactation suppression and the prevention/treatment of engorgement. Provided a hand pump to take home "just in case" her breasts become too uncomfortable, she's aware the hand pump is just to pump for comfort. FOB present. All questions and concerns answered, family to contact [facility] services PRN in case she changes her mind and decides pumping and bottle feeding.   OBJECTIVE Infant data: Mother's Current Feeding Choice: -- (Donor milk)  O2 Device: Room Air  Infant feeding assessment IDFTS - Readiness: 2 IDFTS - Quality: 3   Maternal data: [id number] Vaginal, Spontaneous Pumping frequency: PRN Risk factor for low/delayed milk supply:: primipara, prematurity, IUGR, T1DM, BMI of 34, infant separation  Pump: Manual  ASSESSMENT Infant: Feeding Status: Scheduled 8-11-2-5 Feeding method: Bottle; Tube/Gavage (Bolus) Nipple Type: Nfant Extra Slow Flow (gold)  Maternal: Not pumping  INTERVENTIONS/PLAN Interventions: Interventions: Breast feeding basics reviewed; Education Discharge Education: Engorgement and breast care Tools: Pump  Plan: Consult Status: Complete   Cecil Bixby S Shantia Sanford 08/11/2023, 1:41 PM

## 2023-08-12 ENCOUNTER — Other Ambulatory Visit (HOSPITAL_COMMUNITY): Payer: Self-pay

## 2023-08-12 ENCOUNTER — Encounter: Payer: Self-pay | Admitting: Nurse Practitioner

## 2023-08-12 MED ORDER — POTASSIUM CHLORIDE CRYS ER 20 MEQ PO TBCR: 20.0000 meq | EXTENDED_RELEASE_TABLET | Freq: Every day | ORAL | Status: DC

## 2023-08-12 MED ORDER — ACETAMINOPHEN 325 MG PO TABS: 650.0000 mg | ORAL_TABLET | Freq: Four times a day (QID) | ORAL | Status: DC | PRN

## 2023-08-12 MED ORDER — INSULIN ASPART 100 UNIT/ML FLEXPEN
3.0000 [IU] | PEN_INJECTOR | Freq: Three times a day (TID) | SUBCUTANEOUS | 0 refills | Status: DC
Start: 2023-08-12 — End: 2023-08-17
  Filled 2023-08-12: qty 6, 66d supply, fill #0

## 2023-08-12 MED ORDER — LABETALOL HCL 200 MG PO TABS: 300.0000 mg | ORAL_TABLET | Freq: Two times a day (BID) | ORAL | Status: DC

## 2023-08-12 MED ORDER — INSULIN GLARGINE 100 UNIT/ML SOLOSTAR PEN
15.0000 [IU] | PEN_INJECTOR | Freq: Every day | SUBCUTANEOUS | 11 refills | Status: DC
  Filled 2023-08-12: qty 9, 60d supply, fill #0

## 2023-08-12 MED ORDER — POTASSIUM CHLORIDE CRYS ER 20 MEQ PO TBCR
20.0000 meq | EXTENDED_RELEASE_TABLET | Freq: Every day | ORAL | 0 refills | Status: DC
  Filled 2023-08-12: qty 5, 5d supply, fill #0

## 2023-08-12 MED ORDER — LABETALOL HCL 300 MG PO TABS
300.0000 mg | ORAL_TABLET | Freq: Two times a day (BID) | ORAL | 0 refills | Status: DC
Start: 2023-08-12 — End: 2023-12-07
  Filled 2023-08-12: qty 60, 30d supply, fill #0

## 2023-08-12 MED ORDER — NORETHINDRONE 0.35 MG PO TABS
1.0000 | ORAL_TABLET | Freq: Every day | ORAL | 4 refills | Status: DC
Start: 2023-08-12 — End: 2024-01-01

## 2023-08-12 MED ORDER — FUROSEMIDE 20 MG PO TABS
20.0000 mg | ORAL_TABLET | Freq: Every day | ORAL | 0 refills | Status: DC
  Filled 2023-08-12: qty 5, 5d supply, fill #0

## 2023-08-12 MED ORDER — FLUTICASONE PROPIONATE 50 MCG/ACT NA SUSP
2.0000 | Freq: Every day | NASAL | Status: DC
  Administered 2023-08-12: 2 via NASAL

## 2023-08-12 MED ORDER — IBUPROFEN 600 MG PO TABS
600.0000 mg | ORAL_TABLET | Freq: Four times a day (QID) | ORAL | 0 refills | Status: AC
  Filled 2023-08-12: qty 30, 8d supply, fill #0

## 2023-08-12 MED ORDER — LABETALOL HCL 100 MG PO TABS
100.0000 mg | ORAL_TABLET | Freq: Once | ORAL | Status: AC
  Administered 2023-08-12: 100 mg via ORAL
  Filled 2023-08-12: qty 1

## 2023-08-12 MED ORDER — FLUTICASONE PROPIONATE 50 MCG/ACT NA SUSP
2.0000 | Freq: Every day | NASAL | Status: DC
  Filled 2023-08-12: qty 16

## 2023-08-12 MED ORDER — INSULIN PEN NEEDLE 32G X 4 MM MISC
1 refills | Status: DC
  Filled 2023-08-12: qty 100, 30d supply, fill #0

## 2023-08-12 MED ORDER — LORATADINE 10 MG PO TABS
10.0000 mg | ORAL_TABLET | Freq: Every day | ORAL | Status: DC
  Administered 2023-08-12: 10 mg via ORAL
  Filled 2023-08-12: qty 1

## 2023-08-12 MED ORDER — LABETALOL HCL 200 MG PO TABS: 400.0000 mg | ORAL_TABLET | Freq: Two times a day (BID) | ORAL | Status: DC

## 2023-08-12 NOTE — Discharge Summary (Signed)
 Postpartum Discharge Summary  Date of Service updated-3/29     Patient Name: Diamond Robbins DOB: 1991-02-25 MRN: [id number]  Date of admission: 08/09/2023 Delivery date:08/10/2023 Delivering provider: Joanne Gavel Date of discharge: 08/12/2023  Admitting diagnosis: Fetal growth restriction antepartum [O36.5990] Intrauterine pregnancy: [redacted]w[redacted]d     Secondary diagnosis:  Principal Problem:   Fetal growth restriction: severe <1%(235 grams interval growth) normal UAD  Additional problems: Chronic HTN, Type 1 Diabetes, history of multiple myeloma, uterine fibroids    Discharge diagnosis: Preterm Pregnancy Delivered, CHTN, Type 2 DM, and severe FGR with abnormal Dopplers, history of multiple myeloma, uterine fibroids                                               Post partum procedures: None Augmentation: AROM, Pitocin, and IP Foley Complications: None  Hospital course: Induction of Labor With Vaginal Delivery   33 y.o. yo G2P0111 at [redacted]w[redacted]d was admitted to the hospital 08/09/2023 for induction of labor.  Indication for induction:  Severe FGR with abnormal Dopplers and multiple comorbidities .  Patient was induced with inpatient Foley as well as Pitocin and AROM. Membrane Rupture Time/Date: 6:11 AM,08/10/2023  Delivery Method:Vaginal, Spontaneous Operative Delivery:N/A Episiotomy: None Lacerations:  None Details of delivery can be found in separate delivery note.  Patient had a postpartum course that was uncomplicated.  Hypertension was managed with oral medications including labetalol and Procardia.  Her insulin was restarted at lower doses and will plan to follow-up with endocrinology regarding restarting her OmniPod. Patient is discharged home 08/12/23.  Newborn Data: Birth date:08/10/2023 Birth time:8:11 PM Gender:Female Living status:Living Apgars:8 ,9  Weight:1550 g  Magnesium Sulfate received: No BMZ received: No Rhophylac:No MMR:No T-DaP:Given prenatally Flu: No RSV  Vaccine received: No Transfusion:No  Immunizations received: Immunization History  Administered Date(s) Administered   DTaP / HiB / IPV 03/24/2021   DTaP / IPV 07/16/2021, 09/16/2021   HIB (PRP-T) 07/16/2021, 09/16/2021   Hepatitis B 03/24/2021   Hepatitis B, ADULT 03/24/2021, 07/16/2021, 12/01/2021   Influenza, Seasonal, Injecte, Preservative Fre 05/01/2023   Influenza,inj,Quad PF,6+ Mos 03/16/2021   Pneumococcal Conjugate-13 03/24/2021, 07/16/2021, 09/16/2021   Pneumococcal Polysaccharide-23 12/01/2021   Tdap 07/17/2023    Physical exam  Vitals:   08/11/23 1749 08/11/23 2226 08/12/23 0454 08/12/23 0745  BP: (!) 118/102 (!) 149/103 (!) 131/92 (!) 135/95  Pulse: 91 78 86 88  Resp: 17 16 16 17   Temp: 97.9 F (36.6 C) 98.3 F (36.8 C) 97.6 F (36.4 C) 97.6 F (36.4 C)  TempSrc: Oral Oral Oral Oral  SpO2: 97%  100% 100%  Weight:      Height:       General: alert, cooperative, and no distress Lochia: appropriate Uterine Fundus: firm Incision: N/A DVT Evaluation: No evidence of DVT seen on physical exam. Labs: Lab Results  Component Value Date   WBC 10.4 08/11/2023   HGB 12.8 08/11/2023   HCT 37.8 08/11/2023   MCV 85.1 08/11/2023   PLT 278 08/11/2023      Latest Ref Rng & Units 08/09/2023    3:29 PM  CMP  Glucose 70 - 99 mg/dL 409   BUN 6 - 20 mg/dL 9   Creatinine 8.11 - 9.14 mg/dL 7.82   Sodium 956 - 213 mmol/L 134   Potassium 3.5 - 5.1 mmol/L 3.9   Chloride 98 -  111 mmol/L 104   CO2 22 - 32 mmol/L 18   Calcium 8.9 - 10.3 mg/dL 9.8   Total Protein 6.5 - 8.1 g/dL 7.0   Total Bilirubin 0.0 - 1.2 mg/dL 0.6   Alkaline Phos 38 - 126 U/L 123   AST 15 - 41 U/L 22   ALT 0 - 44 U/L 19    Edinburgh Score:    08/12/2023   12:45 AM  Edinburgh Postnatal Depression Scale Screening Tool  I have been able to laugh and see the funny side of things. 0  I have looked forward with enjoyment to things. 0  I have blamed myself unnecessarily when things went wrong. 0  I  have been anxious or worried for no good reason. 0  I have felt scared or panicky for no good reason. 0  Things have been getting on top of me. 0  I have been so unhappy that I have had difficulty sleeping. 0  I have felt sad or miserable. 0  I have been so unhappy that I have been crying. 0  The thought of harming myself has occurred to me. 0  Edinburgh Postnatal Depression Scale Total 0   Edinburgh Postnatal Depression Scale Total: 0   After visit meds:  Allergies as of 08/12/2023   No Known Allergies      Medication List     STOP taking these medications    ferrous sulfate 325 (65 FE) MG EC tablet   insulin lispro 100 UNIT/ML injection Commonly known as: HumaLOG   Omnipod 5 DexG7G6 Intro Gen 5 Kit   Omnipod 5 DexG7G6 Pods Gen 5 Misc       TAKE these medications    Accu-Chek Guide test strip Generic drug: glucose blood Use to check blood glucose 4 times daily.   Accu-Chek Guide w/Device Kit   Accu-Chek Softclix Lancets lancets Use to check blood glucose 4 times daily.   acetaminophen 325 MG tablet Commonly known as: Tylenol Take 2 tablets (650 mg total) by mouth every 6 (six) hours as needed (for pain scale < 4).   Blood Pressure Monitor Kit Use to check blood pressure as instructed   calcium carbonate 1250 (500 Ca) MG chewable tablet Commonly known as: OS-CAL Chew 1 tablet by mouth daily.   cetirizine 10 MG tablet Commonly known as: ZYRTEC Take 1 tablet by mouth once daily   cholecalciferol 25 MCG (1000 UNIT) tablet Commonly known as: VITAMIN D3 Take 1,000 Units by mouth daily.   Dexcom G6 Sensor Misc Change sensor every 10 days as directed   Dexcom G6 Transmitter Misc Change transmitter every 90 days as directed.   fluticasone 50 MCG/ACT nasal spray Commonly known as: FLONASE Place 2 sprays into both nostrils daily.   furosemide 20 MG tablet Commonly known as: LASIX Take 1 tablet (20 mg total) by mouth daily for 5 days. Start taking on:  August 13, 2023   ibuprofen 600 MG tablet Commonly known as: ADVIL Take 1 tablet (600 mg total) by mouth every 6 (six) hours.   insulin aspart 100 UNIT/ML injection Commonly known as: novoLOG Inject 3 Units into the skin 3 (three) times daily with meals.   insulin glargine-yfgn 100 UNIT/ML injection Commonly known as: SEMGLEE Inject 0.15 mLs (15 Units total) into the skin daily.   INSULIN SYRINGE .3CC/31GX5/16" 31G X 5/16" 0.3 ML Misc 100 each by Does not apply route daily.   labetalol 300 MG tablet Commonly known as: NORMODYNE Take 1 tablet (300  mg total) by mouth 2 (two) times daily. What changed:  medication strength how much to take   NIFEdipine 30 MG 24 hr tablet Commonly known as: PROCARDIA-XL/NIFEDICAL-XL Take 1 tablet (30 mg total) by mouth daily.   norethindrone 0.35 MG tablet Commonly known as: Ortho Micronor Take 1 tablet (0.35 mg total) by mouth daily.   potassium chloride SA 20 MEQ tablet Commonly known as: KLOR-CON M Take 1 tablet (20 mEq total) by mouth daily for 5 days. Please take while on Furosemide   PRENATAL VITAMIN PO Take by mouth.         Discharge home in stable condition Infant Feeding:  In NICU Infant Disposition:rooming in Discharge instruction: per After Visit Summary and Postpartum booklet. Activity: Advance as tolerated. Pelvic rest for 6 weeks.  Diet: carb modified diet Future Appointments: Future Appointments  Date Time Provider Department Center  09/15/2023  8:30 AM Dani Gobble, NP [facility] None  10/04/2023 10:45 AM [facility] None  10/11/2023 11:45 AM Doreatha Massed, MD [facility] None   Follow up Visit:  Follow-up Information     [facility] for [facility] at [facility]. Go in 1 week(s).   Specialty: Obstetrics and Gynecology Why: Please follow up in one week for a blood pressure check Contact information: [address] [place] [address] [phone]                  Please schedule this patient for a In person postpartum visit in 1 week with the following provider: RN. Additional Postpartum F/U:BP check 1 week  High risk pregnancy complicated by:  Severe FGR, chronic hypertension, type 1 diabetes, history of multiple myeloma Delivery mode:  Vaginal, Spontaneous Anticipated Birth Control:  POPs   08/12/2023 Sharon Seller, DO

## 2023-08-14 ENCOUNTER — Encounter: Payer: Self-pay | Admitting: *Deleted

## 2023-08-14 ENCOUNTER — Encounter: Payer: Medicaid Other | Admitting: Obstetrics & Gynecology

## 2023-08-14 ENCOUNTER — Inpatient Hospital Stay (HOSPITAL_COMMUNITY)

## 2023-08-14 ENCOUNTER — Other Ambulatory Visit: Payer: Medicaid Other

## 2023-08-14 ENCOUNTER — Ambulatory Visit (HOSPITAL_COMMUNITY): Payer: Self-pay

## 2023-08-14 LAB — SURGICAL PATHOLOGY

## 2023-08-14 NOTE — Lactation Note (Signed)
 This note was copied from a baby's chart.  NICU Lactation Consultation Note  Patient Name: Diamond Robbins [id number] Date: 08/14/2023 Age:33 days  Reason for consult: Initial assessment; NICU baby; Primapara; 1st time breastfeeding; Late-preterm 34-36.6wks; Infant < 5lbs; Maternal endocrine disorder; Other (Comment); RN request (CHTN, Maternal history of multiple myeloma (no chemo currently)) Type of Endocrine Disorder?: Diabetes (Type 1 DM)  SUBJECTIVE  LC in to visit with P1 Mom of baby "Diamond Robbins" delivered vaginally at [redacted]w[redacted]d with birthweight at 1550 gm and admitted to NICU for SGA.  Baby currently on room air and being gavage fed donor milk.  Mom had originally stated she was going to formula feed due to medications she is on.    She talked with her MD and was told it was safe to breast feed with Rxs she is on.  Mom is taking BP medication and is currently not on any chemotherapy.  She thought she would be starting back after giving birth, but her blood counts are all good and she was given the green light to provide breast milk.  LC provided Mom with a pumping band and assisted her to pump for the first time on initiation setting.  Mom expressed some colostrum.  RN to swab in baby's mouth.  Mom doesn't have a pump at home, will submit a STORK pump request.  LC to return at 5 pm feeding for a breastfeeding assist/assessment.  OBJECTIVE Infant data: Mother's Current Feeding Choice: Breast Milk and Donor Milk  O2 Device: Room Air  Infant feeding assessment IDFTS - Readiness: 2 IDFTS - Quality: 3   Maternal data: [id number] Vaginal, Spontaneous Has patient been taught Hand Expression?: Yes Hand Expression Comments: colostrum drops noted Significant Breast History:: Initiated double pumping at 88 hrs post partum Current breast feeding challenges:: ++ breast changes Does the patient have breastfeeding experience prior to this delivery?: No Pumping frequency: encouraged to pump every  2-3 hrs with a goal of 8 times per 24 hrs Pumped volume: 3 mL Flange Size: 21 Hands-free pumping top sizes: Large Wallace Cullens) Risk factor for low/delayed milk supply:: delayed onset of pumping and infant IUGR in NICU  WIC Program: No WIC Referral Sent?: No Pump: Referral sent for [employer] Pump  ASSESSMENT Infant:  Feeding Status: Scheduled 8-11-2-5 Feeding method: Bottle; Tube/Gavage (Bolus) Nipple Type: Nfant Extra Slow Flow (gold)  Maternal: Milk volume: Normal  INTERVENTIONS/PLAN Interventions: Interventions: Breast feeding basics reviewed; Skin to skin; Breast massage; Hand express; DEBP; Hand pump; Education; [employer] brochure; CDC Guidelines for Breast Pump Cleaning; CDC milk storage guidelines Discharge Education: Engorgement and breast care Tools: Pump; Flanges; Hands-free pumping top Pump Education: Setup, frequency, and cleaning; Milk Storage  Plan: 1- STS with baby as much as possible 2- massage breasts and hand express drops 3- Pump both breasts on initiation setting until volume is >20 ml  Consult Status: NICU follow-up NICU Follow-up type: Verify onset of copious milk; Verify absence of engorgement; Verify DEBP issuance   Diamond Robbins 08/14/2023, 12:32 PM

## 2023-08-15 ENCOUNTER — Inpatient Hospital Stay (HOSPITAL_COMMUNITY)

## 2023-08-15 ENCOUNTER — Encounter: Payer: Self-pay | Admitting: Obstetrics & Gynecology

## 2023-08-15 ENCOUNTER — Ambulatory Visit (HOSPITAL_COMMUNITY): Payer: Self-pay

## 2023-08-15 NOTE — Lactation Note (Signed)
 This note was copied from a baby's chart.  NICU Lactation Consultation Note  Patient Name: Diamond Robbins ZOXWR'U Date: 08/15/2023 Age:33 days  Reason for consult: Follow-up assessment; NICU baby; Primapara; 1st time breastfeeding; Early term 37-38.6wks; Infant < 5lbs; Maternal endocrine disorder; Breastfeeding assistance; Mother's request Type of Endocrine Disorder?: Diabetes (type 1 DM)  SUBJECTIVE  LC in to assist with 2 pm feeding at the breast.  Mom states she last pumped 3 hrs prior.  Breasts are filling, easy flow of transitional milk with hand expression.  Mom reclined back in recliner and baby placed STS in prone position.  Baby actively cueing.  With milk on nipple tip, baby rooting around and opening his mouth but unable to attain a deep latch.  LC initiated a 16 mm nipple shield, demonstrating how to apply to pull nipple into shield.  Baby remained fussy after a few sucks.  Baby placed on Mom's chest STS and RN started gavage feeding.  Baby rejected the pacifier.  Baby placed on second breast with milk on the nipple tip.  Baby fussy and unable to settle into a suck pattern.  Placed baby on Mom's chest with head on her breast.  Mom talking gently to baby and baby started sucking on his pacifier and fell asleep on Mom's chest.    Talked to Mom regarding what a nutritive suck pattern would look and feel like.  Reassured her that she should offer the breast with feeding cues, using the nipple shield to help baby latch deeply.  OBJECTIVE Infant data: Mother's Current Feeding Choice: Breast Milk and Donor Milk  O2 Device: Room Air  Infant feeding assessment IDFTS - Readiness: 2 IDFTS - Quality: 3   Maternal data: [id number] Vaginal, Spontaneous Has patient been taught Hand Expression?: Yes Hand Expression Comments: colostrum drops noted Significant Breast History:: Initiated double pumping at 88 hrs post partum Current breast feeding challenges:: ++ breast changes Does the  patient have breastfeeding experience prior to this delivery?: No Pumping frequency: 8 times per 24 hrs Pumped volume: 5 mL Flange Size: 21 Hands-free pumping top sizes: Large Wallace Cullens) Risk factor for low/delayed milk supply:: delayed onset of pumping and infant IUGR in NICU  WIC Program: No WIC Referral Sent?: No Pump:  (WIC appt 4/4, rooming in until then)  ASSESSMENT Infant: Latch: Repeated attempts needed to sustain latch, nipple held in mouth throughout feeding, stimulation needed to elicit sucking reflex. Audible Swallowing: None Type of Nipple: Everted at rest and after stimulation Comfort (Breast/Nipple): Soft / non-tender Hold (Positioning): Assistance needed to correctly position infant at breast and maintain latch. LATCH Score: 6  Feeding Status: Scheduled 8-11-2-5 Feeding method: Tube/Gavage (Bolus); Breast Nipple Type: Nfant Extra Slow Flow (gold)  Maternal: Milk volume: Normal  INTERVENTIONS/PLAN Interventions: Interventions: Breast feeding basics reviewed; Assisted with latch; Skin to skin; Breast massage; Hand express; Breast compression; Adjust position; Support pillows; Position options; Expressed milk; DEBP; Education Discharge Education: Engorgement and breast care Tools: Pump; Flanges; Bottle; Hands-free pumping top Pump Education: Setup, frequency, and cleaning; Milk Storage  Plan: 1- STS with baby as much as possible 2- offer the breast with feeding cues, asking for help prn 3- Continue consistent pumping to support a full milk supply  Consult Status: NICU follow-up NICU Follow-up type: Verify DEBP issuance; Verify absence of engorgement; Verify onset of copious milk   [name] 08/15/2023, 2:22 PM

## 2023-08-17 ENCOUNTER — Ambulatory Visit (HOSPITAL_COMMUNITY): Payer: Self-pay

## 2023-08-17 ENCOUNTER — Other Ambulatory Visit: Payer: Medicaid Other

## 2023-08-17 ENCOUNTER — Inpatient Hospital Stay (HOSPITAL_COMMUNITY)
Admission: AD | Admit: 2023-08-17 | Discharge: 2023-08-18 | Disposition: A | Discharge status: Home / Self Care | Attending: Obstetrics & Gynecology | Admitting: Obstetrics & Gynecology

## 2023-08-17 ENCOUNTER — Encounter (HOSPITAL_COMMUNITY): Payer: Self-pay | Admitting: Obstetrics & Gynecology

## 2023-08-17 ENCOUNTER — Encounter: Payer: Self-pay | Admitting: *Deleted

## 2023-08-17 ENCOUNTER — Telehealth (INDEPENDENT_AMBULATORY_CARE_PROVIDER_SITE_OTHER): Admitting: *Deleted

## 2023-08-17 VITALS — BP 170/116 | HR 71

## 2023-08-17 DIAGNOSIS — O2403 Pre-existing diabetes mellitus, type 1, in the puerperium: Secondary | ICD-10-CM | POA: Insufficient documentation

## 2023-08-17 DIAGNOSIS — O1013 Pre-existing hypertensive heart disease complicating the puerperium: Secondary | ICD-10-CM | POA: Insufficient documentation

## 2023-08-17 DIAGNOSIS — Z79899 Other long term (current) drug therapy: Secondary | ICD-10-CM | POA: Diagnosis not present

## 2023-08-17 DIAGNOSIS — O165 Unspecified maternal hypertension, complicating the puerperium: Secondary | ICD-10-CM

## 2023-08-17 DIAGNOSIS — Z794 Long term (current) use of insulin: Secondary | ICD-10-CM | POA: Insufficient documentation

## 2023-08-17 DIAGNOSIS — Z0130 Encounter for examination of blood pressure without abnormal findings: Secondary | ICD-10-CM

## 2023-08-17 DIAGNOSIS — O10919 Unspecified pre-existing hypertension complicating pregnancy, unspecified trimester: Secondary | ICD-10-CM

## 2023-08-17 MED ORDER — LABETALOL HCL 5 MG/ML IV SOLN: 40.0000 mg | INTRAVENOUS | Status: DC | PRN

## 2023-08-17 MED ORDER — NIFEDIPINE 10 MG PO CAPS
20.0000 mg | ORAL_CAPSULE | ORAL | Status: DC | PRN
  Administered 2023-08-18: 20 mg via ORAL
  Filled 2023-08-17: qty 2

## 2023-08-17 MED ORDER — NIFEDIPINE 10 MG PO CAPS
10.0000 mg | ORAL_CAPSULE | ORAL | Status: DC | PRN
  Administered 2023-08-17: 10 mg via ORAL
  Filled 2023-08-17: qty 1

## 2023-08-17 NOTE — MAU Provider Note (Signed)
 Chief Complaint:  Hypertension   Event Date/Time   First Provider Initiated Contact with Patient 08/17/23 2333       HPI: Iyari Hagner is a 33 y.o. [id number] who is 7 days postpartum presents to maternity admissions reporting elevated blood pressure.  She has chronic hypertension and required two agents for control.  She is also a Type I Diabetic.  She was discharged home with Labetalol 300mg  bid and Procardia XL 30mg  daily.  Has only been taking Labetalol, states never got other prescription.  Only took Labetalol once today at 5pm   .Pecola Leisure is doing well In NICU She denies h/a, visual changes, dizziness, n/v, or fever/chills.    Hypertension This is a chronic problem. The current episode started today. The problem is uncontrolled. Pertinent negatives include no blurred vision, chest pain or headaches. There are no associated agents to hypertension. Risk factors for coronary artery disease include diabetes mellitus (chronic HTN).   RN Note: Shada Nienaber is a 33 y.o. at Unknown here in MAU reporting HTN today. She had svd on 08/10/23 and has chronic HTN. Only taking Labetalol 300mg  BID after delivery. Has only had one dose today. Pt had video visit today and b/p was high. She had not taken her medication and was to take her meds and have b/p ck tomorrow. She took her b/p tonight and it was still high so she came in. 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   Hypertension    Type 1 diabetes mellitus (HCC) 02/23/2020   Vitamin D deficiency 06/21/2021    Past obstetric history: OB History  Gravida Para Term Preterm AB Living  2 1 0 1 1 1   SAB IAB Ectopic Multiple Live Births  1 0 0 0 1    # Outcome Date GA Lbr Len/2nd Weight Sex Type Anes PTL Lv  2 Preterm 08/10/23  [redacted]w[redacted]d 03:57 / 00:04 1550 g M Vag-Spont EPI  LIV  1 SAB  [redacted]w[redacted]d           Past Surgical History: Past Surgical History:  Procedure Laterality Date   BONE MARROW TRANSPLANT     NO PAST SURGERIES      Family History: Family History  Problem Relation Age of Onset   Diabetes Paternal Grandfather    Hypertension Paternal Grandfather    Hypertension Paternal Grandmother    Other Paternal Grandmother    Diabetes Maternal Grandmother    Stroke Maternal Grandmother    Hypertension Maternal Grandmother    Heart attack Maternal Grandfather    Diabetes Maternal Grandfather    Hypertension Maternal Grandfather    Diabetes Father    Hypertension Father    Miscarriages / Stillbirths Mother    Hypertension Mother     Social History: Social History   Tobacco Use   Smoking status: Never    Passive exposure: Never   Smokeless tobacco: Never  Vaping Use   Vaping status: Never Used  Substance Use Topics   Alcohol use: Not Currently    Comment: occ- 0-1 drinks per week; maybe 1 drink every 6 months; liquor and beer   Drug use: No    Allergies: No Known Allergies  Meds:  Medications Prior to Admission  Medication Sig Dispense Refill Last Dose/Taking   calcium carbonate (OS-CAL)  1250 (500 Ca) MG chewable tablet Chew 1 tablet by mouth daily.   08/16/2023   cholecalciferol (VITAMIN D3) 25 MCG (1000 UNIT) tablet Take 1,000 Units by mouth daily.   08/16/2023   ibuprofen (ADVIL) 600 MG tablet Take 1 tablet (600 mg total) by mouth every 6 (six) hours. 30 each 0 08/17/2023   labetalol (NORMODYNE) 300 MG tablet Take 1 tablet (300 mg total) by mouth 2 (two) times daily. 60 tablet 0 08/17/2023 at  5:00 PM   Prenatal Vit-Fe Fumarate-FA (PRENATAL VITAMIN PO) Take by mouth.   08/16/2023   ACCU-CHEK GUIDE test strip Use to check blood glucose 4 times daily. 125 each 1    Accu-Chek Softclix Lancets lancets Use to check blood glucose 4 times daily. 200 each 0    acetaminophen (TYLENOL) 325 MG tablet Take 2  tablets (650 mg total) by mouth every 6 (six) hours as needed (for pain scale < 4).      Blood Glucose Monitoring Suppl (ACCU-CHEK GUIDE) w/Device KIT       Blood Pressure Monitor KIT Use to check blood pressure as instructed 1 kit 0    cetirizine (ZYRTEC) 10 MG tablet Take 1 tablet by mouth once daily 30 tablet 0    Continuous Glucose Sensor (DEXCOM G6 SENSOR) MISC Change sensor every 10 days as directed 9 each 3    Continuous Glucose Transmitter (DEXCOM G6 TRANSMITTER) MISC Change transmitter every 90 days as directed. 1 each 3    fluticasone (FLONASE) 50 MCG/ACT nasal spray Place 2 sprays into both nostrils daily. 16 g 6    Insulin Disposable Pump (OMNIPOD 5 DEXG7G6 INTRO GEN 5) KIT by Does not apply route.      Insulin Pen Needle 32G X 4 MM MISC Use to inject insulin as directed 100 each 1    Insulin Syringe-Needle U-100 (INSULIN SYRINGE .3CC/31GX5/16") 31G X 5/16" 0.3 ML MISC 100 each by Does not apply route daily. 100 each 3    norethindrone (ORTHO MICRONOR) 0.35 MG tablet Take 1 tablet (0.35 mg total) by mouth daily. (Patient not taking: Reported on 08/17/2023) 90 tablet 4     I have reviewed patient's Past Medical Hx, Surgical Hx, Family Hx, Social Hx, medications and allergies.  ROS:  Review of Systems  Eyes:  Negative for blurred vision.  Cardiovascular:  Negative for chest pain.  Neurological:  Negative for headaches.   Other systems negative     Physical Exam  Patient Vitals for the past 24 hrs:  BP Temp Pulse Resp SpO2 Height Weight  08/17/23 2327 (!) 176/120 -- 73 -- -- -- --  08/17/23 2307 (!) 182/120 -- -- -- -- -- --  08/17/23 2304 -- 97.9 F (36.6 C) 79 18 97 % 5\' 3"  (1.6 m) 87.1 kg   Vitals:   08/17/23 2307 08/17/23 2327 08/17/23 2332 08/17/23 2346  BP: (!) 182/120 (!) 176/120 (!) 176/120 (!) 190/125   08/18/23 0002 08/18/23 0007 08/18/23 0016 08/18/23 0031  BP: (!) 179/118 (!) 179/118 (!) 178/119 (!) 181/124   08/18/23 0032 08/18/23 0054 08/18/23 0101 08/18/23  0115  BP: (!) 181/124 (!) 139/99 (!) 133/99 (!) 137/96    Constitutional: Well-developed, well-nourished female in no acute distress.  Cardiovascular: normal rate and rhythm, no ectopy audible, S1 & S2 heard, no murmur Respiratory: normal effort, no distress. Lungs CTAB with no wheezes or crackles GI: Abd soft, non-tender.  Nondistended.  No rebound, No guarding.  Bowel Sounds audible  MS: Extremities nontender, no edema,  normal ROM Neurologic: Alert and oriented x 4.   Grossly nonfocal. Skin:  Warm and Dry Psych:  Affect appropriate.   Labs: No results found for this or any previous visit (from the past 24 hours). Prior labs normal  --/--/B POS (03/26 1529)  Imaging:    MAU Course/MDM: I have reviewed the triage vital signs and the nursing notes.   Pertinent labs & imaging results that were available during my care of the patient were reviewed by me and considered in my medical decision making (see chart for details).      I have reviewed her medical records including past results, notes and treatments.   I have ordered labs as follows:  None needed per Dr Despina Hidden Imaging ordered: none   Consult Dr Despina Hidden.   Treatments in MAU included Preeclampsia focused order set utilized with the Procardia pathway  (10mg , 20mg , 20mg ).  We escalated to third level   After third dose, BPs came down to manageable levels   Did not want to run her too low so stopped dosing at that point.  Discussed recommended meds for home  Will represcribe the Procardia XL. Marland Kitchen   Pt stable at time of discharge.  Assessment: Postpartum Day#7 Chronic Hypertension with severe exacerbation, likely due to inadequate medication dosage today.   Plan: Discharge home Recommend Get back on med schedule Rx sent for Procardia XL 30mg   for Htn Continue Labetalol 300mg  bid Followup in office for BP check as scheduled  Encouraged to return here or to other Urgent Care/ED if she develops worsening of symptoms, increase in  pain, fever, or other concerning symptoms.   Wynelle Bourgeois CNM, MSN Certified Nurse-Midwife 08/17/2023 11:33 PM

## 2023-08-17 NOTE — Progress Notes (Signed)
   NURSE VISIT- BLOOD PRESSURE CHECK  I connected with Vinie Sill on 08/17/2023 by MyChart video and verified that I am speaking with the correct person using two identifiers.   I discussed the limitations of evaluation and management by telemedicine. The patient expressed understanding and agreed to proceed.  Nurse is at the office, and patient is at home.  SUBJECTIVE:  Diamond Robbins is a 33 y.o. [phone] female here for BP check. She is postpartum, delivery date 08/10/23.     HYPERTENSION ROS:  Pregnant/postpartum:  Severe headaches that don't go away with tylenol/other medicines: No  Visual changes (seeing spots/double/blurred vision) No  Severe pain under right breast breast or in center of upper chest No  Severe nausea/vomiting No  Taking medicines as instructed forgot med this am; has been taking med as directed.     OBJECTIVE:  BP (!) 170/116   Pulse 71   LMP 11/28/2022   Breastfeeding Yes   Appearance alert, well appearing, and in no distress.  ASSESSMENT: Postpartum  blood pressure check  PLAN: Discussed with Dr. Charlotta Newton   Recommendations:  take med now!     Follow-up:  tomorrow with nurse for BP check. Take med 2 hours before appt.     [name]  08/17/2023 2:44 PM

## 2023-08-17 NOTE — Lactation Note (Signed)
 This note was copied from a baby's chart.  NICU Lactation Consultation Note  Patient Name: Diamond Robbins [id number] Date: 08/17/2023 Age:33 days  Reason for consult: Weekly NICU follow-up; Primapara; 1st time breastfeeding; NICU baby; Early term 37-38.6wks; Maternal endocrine disorder; Infant < 5lbs; Other (Comment) (severe IUGR, NP request) Type of Endocrine Disorder?: Diabetes (T1DM)  SUBJECTIVE Visited with family of 61 33/5 weeks old AGA NICU female "Diamond Robbins"; NP Eber Jones asked this LC to check on [name] due to supply; she is a P1 and reported that despite pumping consistently her supply remains BNL. She had a late onset of pumping due to feeding choice on admission; but she's willing to work on her supply. Confirmed with NICU RN Angelia that "Diamond Robbins" is mostly on donor milk. She has also taken baby to breast with [facility] but voiced that she would like to work on pumping and bottle feeding in the meantime. Explained how taking baby to breast will also aid with increasing supply. Reviewed strategies to increase supply; she continues rooming in with baby until she can get her pump from the [facility] office tomorrow.   OBJECTIVE Infant data: Mother's Current Feeding Choice: Breast Milk and Donor Milk  O2 Device: Room Air  Infant feeding assessment IDFTS - Readiness: 2 IDFTS - Quality: 2   Maternal data: [id number] Vaginal, Spontaneous Pumping frequency: 7 times/24 hours Pumped volume: 20 mL (20-35 ml)  WIC Program: No WIC Referral Sent?: No Pump:  (WIC appt 4/4, rooming in until then)  ASSESSMENT Infant: Feeding Status: Scheduled 8-11-2-5 Feeding method: Bottle; Tube/Gavage (Bolus) Nipple Type: Nfant Extra Slow Flow (gold)  Maternal: Milk volume: Low  INTERVENTIONS/PLAN Interventions: Interventions: Breast feeding basics reviewed; DEBP; Education  Plan: STS around care times Pump both breasts every 3 hours on maintain mode for 30 minutes; ideally 8 pumping sessions/24  hours Power pump once/day Call for latch assistance if she decides taking "Chace" to breast again P/U her pump from the [facility] office on 08/18/2023  No other support person in the meantime. All questions and concerns answered, family to contact [facility] services PRN.  Consult Status: NICU follow-up NICU Follow-up type: Verify DEBP issuance; Weekly NICU follow up   [name] 08/17/2023, 12:06 PM

## 2023-08-17 NOTE — MAU Note (Signed)
.  Diamond Robbins is a 33 y.o. at Unknown here in MAU reporting HTN today. She had svd on 08/10/23 and has chronic HTN. Only taking Labetalol 300mg  BID after delivery. Has only had one dose today. Pt had video visit today and b/p was high. She had not taken her medication and was to take her meds and have b/p ck tomorrow. She took her b/p tonight and it was still high so she came in. No pain and reports scant amt vag bleeding  LMP: n/a Onset of complaint: ongoing Pain score: 0 Vitals:   08/17/23 2304 08/17/23 2307  BP:  (!) 182/120  Pulse: 79   Resp: 18   Temp: 97.9 F (36.6 C)   SpO2: 97%      FHT: n/a  Lab orders placed from triage: none

## 2023-08-18 ENCOUNTER — Telehealth: Payer: Self-pay | Admitting: *Deleted

## 2023-08-18 ENCOUNTER — Telehealth: Admitting: *Deleted

## 2023-08-18 DIAGNOSIS — O165 Unspecified maternal hypertension, complicating the puerperium: Secondary | ICD-10-CM

## 2023-08-18 DIAGNOSIS — Z0130 Encounter for examination of blood pressure without abnormal findings: Secondary | ICD-10-CM

## 2023-08-18 MED ORDER — NIFEDIPINE ER OSMOTIC RELEASE 30 MG PO TB24: 30.0000 mg | ORAL_TABLET | Freq: Every day | ORAL | 2 refills | Status: AC

## 2023-08-18 NOTE — Telephone Encounter (Signed)
 Pt went to MAU last night for elevated BP. Pt was supposed to be taking Labetalol 300 mg BID and Procardia 30 mg XL daily. Pt was only taking Labetalol. MAU sent in prescription for Procardia; pt will pick that up today. Pt didn't have access to a BP machine this am. I spoke with Dr. Charlotta Newton. BP check with nurse scheduled in 1 week. Take meds 2 hours before appt. Pt voiced understanding. JSY

## 2023-08-19 ENCOUNTER — Ambulatory Visit (HOSPITAL_COMMUNITY): Payer: Self-pay

## 2023-08-19 NOTE — Lactation Note (Signed)
 This note was copied from a baby's chart.  NICU Lactation Consultation Note  Patient Name: Diamond Robbins ZOXWR'[id number] Date: 08/19/2023 Age:33 days  Reason for consult: Follow-up assessment; NICU baby; Primapara; 1st time breastfeeding; Early term 37-38.6wks; Infant < 5lbs; Maternal endocrine disorder; RN request Type of Endocrine Disorder?: Diabetes (Type 1 DM)  SUBJECTIVE  LC in to visit with P1 Mom of ET PMA baby "Diamond Robbins" in the NICU.  Mom has been consistently pumping and pleased that her milk volume has increased to 30 ml per session.  Mom continues to try power-pumping once a day.  LC provided Mom with a handout on galactagogues and encouraged her to research [place].  Mom currently bottle feeding baby.  Encouraged STS with baby and frequent pumping.  OBJECTIVE Infant data: No data recorded O2 Device: Room Air  Infant feeding assessment IDFTS - Readiness: 2 IDFTS - Quality: 3   Maternal data: [id number] Vaginal, Spontaneous Pumping frequency: 6-8 times per 24 hrs Pumped volume: 30 mL Flange Size: 21 Hands-free pumping top sizes: Large Wallace Cullens)  WIC Program: No WIC Referral Sent?: No Pump:  (WIC appt 4/4, rooming in until then)  ASSESSMENT Infant:  Feeding Status: Scheduled 8-11-2-5 Feeding method: Bottle; Tube/Gavage (Bolus) Nipple Type: Dr. Levert Feinstein Preemie  Maternal: Milk volume: Low  INTERVENTIONS/PLAN Interventions: Interventions: Breast feeding basics reviewed; Skin to skin; Breast massage; Hand express; DEBP; Pace feeding Tools: Pump; Flanges; Hands-free pumping top; Bottle Pump Education: Setup, frequency, and cleaning; Milk Storage  Plan: Consult Status: NICU follow-up NICU Follow-up type: Verify DEBP issuance; Weekly NICU follow up   Diamond Robbins 08/19/2023, 2:27 PM

## 2023-08-21 ENCOUNTER — Encounter: Payer: Medicaid Other | Admitting: Obstetrics & Gynecology

## 2023-08-21 ENCOUNTER — Other Ambulatory Visit: Payer: Medicaid Other | Admitting: Radiology

## 2023-08-21 NOTE — Progress Notes (Signed)
 Pt was seen at MAU 08/17/22 and didn't have access to a BP machine. Dr. Charlotta Newton aware. JSY

## 2023-08-24 ENCOUNTER — Other Ambulatory Visit: Payer: Medicaid Other

## 2023-08-25 ENCOUNTER — Ambulatory Visit (HOSPITAL_COMMUNITY): Payer: Self-pay

## 2023-08-25 ENCOUNTER — Encounter: Payer: Self-pay | Admitting: *Deleted

## 2023-08-25 ENCOUNTER — Telehealth: Admitting: *Deleted

## 2023-08-25 VITALS — BP 132/95 | HR 80 | Ht 63.0 in

## 2023-08-25 DIAGNOSIS — Z0130 Encounter for examination of blood pressure without abnormal findings: Secondary | ICD-10-CM

## 2023-08-25 NOTE — Lactation Note (Signed)
 This note was copied from a baby's chart.  NICU Lactation Consultation Note  Patient Name: Diamond Robbins [id number] Date: 08/25/2023 Age:33 wk.o.  Reason for consult: Weekly NICU follow-up; Primapara; 1st time breastfeeding; NICU baby; Early term 50-38.6wks; Infant < 5lbs; Maternal endocrine disorder; Other (Comment); RN request; Exclusive pumping and bottle feeding (IUGR) Type of Endocrine Disorder?: Diabetes (T1DM)  SUBJECTIVE Visited with family of 27 54/74 weeks old AGA NICU female; NICU RN Tobi Bastos called out for lactation because Ms. Mauzy started to get sore (see maternal assessment). She voiced she wasn't using anything for lubrication prior pumping, provided coconut oil and advised to use prior each pumping session and her EBM right after pumping. Noticed that she was using the # 21 flange on her R nipple and the # 24 on her L one, but voiced that the # 24 feels better. She has decided to exclusively pump and bottle feed baby "Diamond Robbins"; she's rooming in with baby in the meantime, she missed her [facility] appt. Offered a loaner pump just in case and encouraged her to reschedule her [facility] appt for pump issuance. Provided another set of # 24 flanges per her request.  OBJECTIVE Infant data: Mother's Current Feeding Choice: Breast Milk  O2 Device: Room Air  Infant feeding assessment IDFTS - Readiness: 2 IDFTS - Quality: 3   Maternal data: [id number] Vaginal, Spontaneous Pumping frequency: 7-8 times/24 hours Pumped volume: 90 mL (90-120 ml) Flange Size: 24 (resize to # 24 on 08/25/2023)  WIC Program: No WIC Referral Sent?: No Pump:  (Rooming in until she gets her pump from [facility])  ASSESSMENT Infant: Feeding Status: Scheduled 8-11-2-5 Feeding method: Bottle; Tube/Gavage (Bolus) Nipple Type: Dr. Levert Feinstein Preemie  Maternal: Milk volume: Normal R nipple looked erythematous but no cracking/bleeding or other signs of nipple trauma  INTERVENTIONS/PLAN Interventions: Tools: Coconut oil;  Flanges  Plan: STS around care times Pump both breasts every 3 hours on maintain mode for 30 minutes; ideally 8 pumping sessions/24 hours; use coconut oil prior pumping Reschedule appt with the [facility] office for pump issuance   No other support person in the meantime. All questions and concerns answered, family to contact [facility] services PRN.  Consult Status: NICU follow-up NICU Follow-up type: Verify DEBP issuance; Weekly NICU follow up   Swetha Rayle S Philis Nettle 08/25/2023, 3:51 PM

## 2023-08-25 NOTE — Progress Notes (Signed)
   NURSE VISIT- BLOOD PRESSURE CHECK  I connected with Vinie Sill on 08/25/2023 by MyChart video and verified that I am speaking with the correct person using two identifiers.   I discussed the limitations of evaluation and management by telemedicine. The patient expressed understanding and agreed to proceed.  Nurse is at the office, and patient is at home.  SUBJECTIVE:  Diamond Robbins is a 33 y.o. [phone] female here for BP check. She is postpartum, delivery date 08/12/23.     HYPERTENSION ROS:  Pregnant/postpartum:  Severe headaches that don't go away with tylenol/other medicines: No  Visual changes (seeing spots/double/blurred vision) No  Severe pain under right breast breast or in center of upper chest No  Severe nausea/vomiting No  Taking medicines as instructed yes    OBJECTIVE:  BP (!) 132/95 (BP Location: Right Arm, Patient Position: Sitting, Cuff Size: Normal)   Pulse 80   Ht 5\' 3"  (1.6 m)   Breastfeeding Yes   BMI 34.01 kg/m   Appearance alert, well appearing, and in no distress. Pt's first BP reading was 145/97 pulse 82.   ASSESSMENT: Postpartum  blood pressure check  PLAN: Discussed with Dr. Despina Hidden   Recommendations: no changes needed   Follow-up:  1 week for BP check with nurse.     [name]  08/25/2023 10:02 AM

## 2023-08-25 NOTE — Progress Notes (Deleted)
   NURSE VISIT- BLOOD PRESSURE CHECK  SUBJECTIVE:  Diamond Robbins is a 33 y.o. [phone] female here for BP check. She is postpartum, delivery date 08/12/23     HYPERTENSION ROS:  Pregnant/postpartum:  Severe headaches that don't go away with tylenol/other medicines: No  Visual changes (seeing spots/double/blurred vision) No  Severe pain under right breast breast or in center of upper chest No  Severe nausea/vomiting No  Taking medicines as instructed yes    OBJECTIVE:  Ht 5\' 3"  (1.6 m)   Breastfeeding Yes   BMI 34.01 kg/m   Appearance alert, well appearing, and in no distress.  ASSESSMENT: Postpartum  blood pressure check  PLAN: Discussed with Dr. Despina Hidden   Recommendations: {Blank single:19197::"no changes needed","new prescription will be sent","stop medicine 2 days before next visit","check pre-e labs today"}   Follow-up: {Blank single:19197::"as scheduled","in 2 days","in 2 weeks","in 4 weeks","in 3 months"}   Diamond Robbins  08/25/2023 9:42 AM

## 2023-08-26 DIAGNOSIS — Z419 Encounter for procedure for purposes other than remedying health state, unspecified: Secondary | ICD-10-CM | POA: Diagnosis not present

## 2023-08-27 ENCOUNTER — Ambulatory Visit (HOSPITAL_COMMUNITY): Payer: Self-pay

## 2023-08-27 NOTE — Lactation Note (Signed)
 This note was copied from a baby's chart.  NICU Lactation Consultation Note  Patient Name: Diamond Robbins [id number] Date: 08/27/2023 Age:33 wk.o.  Reason for consult: Mother's request; Breastfeeding assistance; RN request; Weekly NICU follow-up; NICU baby; Infant < 5lbs; Early term 102-38.6wks; Maternal endocrine disorder; Primapara; 1st time breastfeeding; Other (Comment) (IUGR) Type of Endocrine Disorder?: Diabetes (T1DM)  SUBJECTIVE Visited with family of 33 56/66 weeks old AGA NICU female; NICU RN Hailey called lactation to assist with the 12 pm feeding, baby "Diamond Robbins" just got switched to ad lib status today. He was already crying and searching for the nipple when entered the room; try to latch him at the bare breast but he kept slipping off and kept crying. Tried to calm him down with gloved finger, he had a good suction but as soon as finger was withdrawn he started crying again and had a hard time transitioning to the breast. Once NS # 20 was introduced baby was already too upset to take it, he wouldn't latch and kept crying. Ms. Schamp proceeded to give "Diamond Robbins" a bottle, he took it with ease and stop crying all together. An attempt was documented in flowsheets.   OBJECTIVE Infant data: Mother's Current Feeding Choice: Breast Milk  O2 Device: Room Air  Infant feeding assessment IDFTS - Readiness: 1 (Infant awake prior to attempt with LC) IDFTS - Quality: 2   Maternal data: [id number] Vaginal, Spontaneous Pumping frequency: 7-8 times/24 hours Pumped volume: 90 mL (90-120 ml)  WIC Program: No WIC Referral Sent?: No Pump: Advised to call insurance company (Rooming in until she gets her pump from [facility])  ASSESSMENT Infant: Latch: Too sleepy or reluctant, no latch achieved, no sucking elicited. Audible Swallowing: None Type of Nipple: Everted at rest and after stimulation Comfort (Breast/Nipple): Soft / non-tender Hold (Positioning): Assistance needed to correctly position infant at  breast and maintain latch. LATCH Score: 5  Feeding Status: (S) Ad lib (Per order) Feeding method: Breast; Bottle Nipple Type: Dr. Leticia Raven Preemie  Maternal: Milk volume: Normal  INTERVENTIONS/PLAN Interventions: Interventions: Breast feeding basics reviewed; Assisted with latch; Hand express; Breast compression; Adjust position; Support pillows; DEBP; Education; Coconut oil Tools: Nipple Shields Nipple shield size: 20  Plan: STS around care times Pump both breasts every 3 hours on maintain mode for 30 minutes; ideally 8 pumping sessions/24 hours; use coconut oil prior pumping Continue taking "Diamond Robbins" to breast on feeding cues; feeding on demand Family will continue working on bottle feedings Reschedule appt with the [facility] office for pump issuance   No other support person in the meantime. All questions and concerns answered, family to contact [facility] services PRN.  Consult Status: NICU follow-up NICU Follow-up type: Verify DEBP issuance; Baby's discharge   Diamond Robbins Bare 08/27/2023, 1:36 PM

## 2023-08-28 ENCOUNTER — Ambulatory Visit (HOSPITAL_COMMUNITY): Payer: Self-pay

## 2023-08-28 ENCOUNTER — Other Ambulatory Visit: Payer: Medicaid Other | Admitting: Radiology

## 2023-08-28 ENCOUNTER — Encounter: Payer: Medicaid Other | Admitting: Obstetrics & Gynecology

## 2023-08-28 NOTE — Lactation Note (Signed)
 This note was copied from a baby's chart.  NICU Lactation Consultation Note  Patient Name: Diamond Robbins [id number] Date: 08/28/2023 Age:33 wk.o.  Reason for consult: Follow-up assessment; NICU baby; Primapara; 1st time breastfeeding; Maternal endocrine disorder; Term; Infant < 5lbs Type of Endocrine Disorder?: Diabetes (type 1 DM)  SUBJECTIVE  LC in to visit with P1 Mom of baby "Diamond Robbins".  Mom currently pace bottle feeding baby as he has been ad lib since noon yesterday.  Mom is choosing to bottle feed, but she isn't ruling out breastfeeding.  Talked to her about baby's ability as he grows in size and matures. LC talked about Mom and baby seeking OP lactation support after discharge.  Talked about [facility] OP lactation team.   WIC appt 4/16 to receive her pump.  OBJECTIVE Infant data: Mother's Current Feeding Choice: Breast Milk  O2 Device: Room Air  Infant feeding assessment IDFTS - Readiness: 1 IDFTS - Quality: 2   Maternal data: [id number] Vaginal, Spontaneous Pumping frequency: 8 times per 24 hrs Pumped volume: 120 mL Hands-free pumping top sizes: Small/Medium (Blue)  WIC Program: No WIC Referral Sent?: No Pump: Advised to call insurance company (Rooming in until she gets her pump from [facility])  ASSESSMENT Infant:  Feeding Status: Ad lib Feeding method: Bottle Nipple Type: Dr. Leticia Raven Preemie  Maternal: Milk volume: Normal  INTERVENTIONS/PLAN Interventions: Interventions: DEBP Tools: Nipple Shields Nipple shield size: 20  Plan: Consult Status: NICU follow-up NICU Follow-up type: Weekly NICU follow up   Diamond Robbins 08/28/2023, 5:30 PM

## 2023-08-31 ENCOUNTER — Other Ambulatory Visit: Payer: Medicaid Other

## 2023-08-31 DIAGNOSIS — Z3482 Encounter for supervision of other normal pregnancy, second trimester: Secondary | ICD-10-CM | POA: Diagnosis not present

## 2023-08-31 DIAGNOSIS — Z3483 Encounter for supervision of other normal pregnancy, third trimester: Secondary | ICD-10-CM | POA: Diagnosis not present

## 2023-09-14 ENCOUNTER — Ambulatory Visit: Admitting: Obstetrics & Gynecology

## 2023-09-15 ENCOUNTER — Ambulatory Visit: Payer: Medicaid Other | Admitting: Nurse Practitioner

## 2023-09-15 DIAGNOSIS — E782 Mixed hyperlipidemia: Secondary | ICD-10-CM

## 2023-09-15 DIAGNOSIS — Z794 Long term (current) use of insulin: Secondary | ICD-10-CM

## 2023-09-15 DIAGNOSIS — E1065 Type 1 diabetes mellitus with hyperglycemia: Secondary | ICD-10-CM

## 2023-09-25 DIAGNOSIS — Z419 Encounter for procedure for purposes other than remedying health state, unspecified: Secondary | ICD-10-CM | POA: Diagnosis not present

## 2023-10-04 ENCOUNTER — Inpatient Hospital Stay: Payer: Medicaid Other

## 2023-10-05 ENCOUNTER — Inpatient Hospital Stay: Attending: Hematology

## 2023-10-05 DIAGNOSIS — C9000 Multiple myeloma not having achieved remission: Secondary | ICD-10-CM | POA: Insufficient documentation

## 2023-10-11 ENCOUNTER — Inpatient Hospital Stay: Payer: Medicaid Other | Admitting: Hematology

## 2023-10-11 ENCOUNTER — Inpatient Hospital Stay

## 2023-10-12 ENCOUNTER — Inpatient Hospital Stay

## 2023-10-12 DIAGNOSIS — C9000 Multiple myeloma not having achieved remission: Secondary | ICD-10-CM | POA: Diagnosis not present

## 2023-10-12 DIAGNOSIS — C9001 Multiple myeloma in remission: Secondary | ICD-10-CM

## 2023-10-12 LAB — CBC WITH DIFFERENTIAL/PLATELET
Abs Immature Granulocytes: 0.02 10*3/uL (ref 0.00–0.07)
Basophils Absolute: 0 10*3/uL (ref 0.0–0.1)
Basophils Relative: 1 %
Eosinophils Absolute: 0.3 10*3/uL (ref 0.0–0.5)
Eosinophils Relative: 8 %
HCT: 42.7 % (ref 36.0–46.0)
Hemoglobin: 14.1 g/dL (ref 12.0–15.0)
Immature Granulocytes: 0 %
Lymphocytes Relative: 34 %
Lymphs Abs: 1.5 10*3/uL (ref 0.7–4.0)
MCH: 28.3 pg (ref 26.0–34.0)
MCHC: 33 g/dL (ref 30.0–36.0)
MCV: 85.7 fL (ref 80.0–100.0)
Monocytes Absolute: 0.4 10*3/uL (ref 0.1–1.0)
Monocytes Relative: 9 %
Neutro Abs: 2.2 10*3/uL (ref 1.7–7.7)
Neutrophils Relative %: 48 %
Platelets: 313 10*3/uL (ref 150–400)
RBC: 4.98 MIL/uL (ref 3.87–5.11)
RDW: 12.4 % (ref 11.5–15.5)
WBC: 4.5 10*3/uL (ref 4.0–10.5)
nRBC: 0 % (ref 0.0–0.2)

## 2023-10-12 LAB — COMPREHENSIVE METABOLIC PANEL WITH GFR
ALT: 41 U/L (ref 0–44)
AST: 26 U/L (ref 15–41)
Albumin: 4 g/dL (ref 3.5–5.0)
Alkaline Phosphatase: 66 U/L (ref 38–126)
Anion gap: 12 (ref 5–15)
BUN: 12 mg/dL (ref 6–20)
CO2: 22 mmol/L (ref 22–32)
Calcium: 9.4 mg/dL (ref 8.9–10.3)
Chloride: 103 mmol/L (ref 98–111)
Creatinine, Ser: 0.64 mg/dL (ref 0.44–1.00)
GFR, Estimated: >60 mL/min (ref >60)
Glucose, Bld: 115 mg/dL — ABNORMAL HIGH (ref 70–99)
Potassium: 3.8 mmol/L (ref 3.5–5.1)
Sodium: 137 mmol/L (ref 135–145)
Total Bilirubin: 0.7 mg/dL (ref 0.0–1.2)
Total Protein: 7.9 g/dL (ref 6.5–8.1)

## 2023-10-13 LAB — PROTEIN ELECTROPHORESIS, SERUM
A/G Ratio: 1.1 (ref 0.7–1.7)
Albumin ELP: 4 g/dL (ref 2.9–4.4)
Alpha-1-Globulin: 0.2 g/dL (ref 0.0–0.4)
Alpha-2-Globulin: 0.9 g/dL (ref 0.4–1.0)
Beta Globulin: 1.4 g/dL — ABNORMAL HIGH (ref 0.7–1.3)
Gamma Globulin: 1.2 g/dL (ref 0.4–1.8)
Globulin, Total: 3.7 g/dL (ref 2.2–3.9)
Total Protein ELP: 7.7 g/dL (ref 6.0–8.5)

## 2023-10-13 LAB — KAPPA/LAMBDA LIGHT CHAINS
Kappa free light chain: 11.6 mg/L (ref 3.3–19.4)
Kappa, lambda light chain ratio: 1.12 (ref 0.26–1.65)
Lambda free light chains: 10.4 mg/L (ref 5.7–26.3)

## 2023-10-16 LAB — IMMUNOFIXATION ELECTROPHORESIS
IgA: 108 mg/dL (ref 87–352)
IgG (Immunoglobin G), Serum: 1308 mg/dL (ref 586–1602)
IgM (Immunoglobulin M), Srm: 26 mg/dL (ref 26–217)
Total Protein ELP: 7.9 g/dL (ref 6.0–8.5)

## 2023-10-19 ENCOUNTER — Inpatient Hospital Stay

## 2023-10-19 ENCOUNTER — Inpatient Hospital Stay: Attending: Hematology | Admitting: Hematology

## 2023-10-19 VITALS — BP 132/103 | HR 86 | Temp 97.4°F | Resp 20 | Wt 179.5 lb

## 2023-10-19 DIAGNOSIS — C9000 Multiple myeloma not having achieved remission: Secondary | ICD-10-CM | POA: Insufficient documentation

## 2023-10-19 DIAGNOSIS — C9001 Multiple myeloma in remission: Secondary | ICD-10-CM | POA: Diagnosis not present

## 2023-10-19 DIAGNOSIS — Z23 Encounter for immunization: Secondary | ICD-10-CM | POA: Diagnosis not present

## 2023-10-19 DIAGNOSIS — I10 Essential (primary) hypertension: Secondary | ICD-10-CM | POA: Diagnosis not present

## 2023-10-19 MED ORDER — MEASLES, MUMPS & RUBELLA VAC IJ SOLR
0.5000 mL | Freq: Once | INTRAMUSCULAR | Status: AC
  Administered 2023-10-19: 0.5 mL via SUBCUTANEOUS
  Filled 2023-10-19: qty 0.5

## 2023-10-19 NOTE — Patient Instructions (Signed)
 [facility] [place] - A DEPT OF [facility]  Discharge Instructions: Thank you for choosing [facility] to provide your oncology and hematology care.  If you have a lab appointment with the Cancer Center - please note that after April 8th, 2024, all labs will be drawn in the cancer center.  You do not have to check in or register with the main entrance as you have in the past but will complete your check-in in the cancer center.  Wear comfortable clothing and clothing appropriate for easy access to any Portacath or PICC line.   We strive to give you quality time with your provider. You may need to reschedule your appointment if you arrive late (15 or more minutes).  Arriving late affects you and other patients whose appointments are after yours.  Also, if you miss three or more appointments without notifying the office, you may be dismissed from the clinic at the provider's discretion.      For prescription refill requests, have your pharmacy contact our office and allow 72 hours for refills to be completed.    Today you received the following chemotherapy and/or immunotherapy agents MMR vaccination       To help prevent nausea and vomiting after your treatment, we encourage you to take your nausea medication as directed.  BELOW ARE SYMPTOMS THAT SHOULD BE REPORTED IMMEDIATELY: *FEVER GREATER THAN 100.4 F (38 C) OR HIGHER *CHILLS OR SWEATING *NAUSEA AND VOMITING THAT IS NOT CONTROLLED WITH YOUR NAUSEA MEDICATION *UNUSUAL SHORTNESS OF BREATH *UNUSUAL BRUISING OR BLEEDING *URINARY PROBLEMS (pain or burning when urinating, or frequent urination) *BOWEL PROBLEMS (unusual diarrhea, constipation, pain near the anus) TENDERNESS IN MOUTH AND THROAT WITH OR WITHOUT PRESENCE OF ULCERS (sore throat, sores in mouth, or a toothache) UNUSUAL RASH, SWELLING OR PAIN  UNUSUAL VAGINAL DISCHARGE OR ITCHING   Items with * indicate a potential emergency and should be  followed up as soon as possible or go to the Emergency Department if any problems should occur.  Please show the CHEMOTHERAPY ALERT CARD or IMMUNOTHERAPY ALERT CARD at check-in to the Emergency Department and triage nurse.  Should you have questions after your visit or need to cancel or reschedule your appointment, please contact [facility] [place] - A DEPT OF [facility] [phone]  and follow the prompts.  Office hours are 8:00 a.m. to 4:30 p.m. Monday - Friday. Please note that voicemails left after 4:00 p.m. may not be returned until the following business day.  We are closed weekends and major holidays. You have access to a nurse at all times for urgent questions. Please call the main number to the clinic [phone] and follow the prompts.  For any non-urgent questions, you may also contact your provider using MyChart. We now offer e-Visits for anyone 56 and older to request care online for non-urgent symptoms. For details visit [email or web address].   Also download the MyChart app! Go to the app store, search "MyChart", open the app, select [place], and log in with your MyChart username and password.

## 2023-10-19 NOTE — Patient Instructions (Addendum)
 [facility]  Discharge Instructions  You were seen and examined today by Dr. Cheree Cords.  Dr. Cheree Cords discussed your most recent lab work which revealed that everything looks good and stable. Continue to hold the Revlimid  since you are breast feeding.  Follow-up as scheduled in 3 months.    Thank you for choosing [facility] to provide your oncology and hematology care.   To afford each patient quality time with our provider, please arrive at least 15 minutes before your scheduled appointment time. You may need to reschedule your appointment if you arrive late (10 or more minutes). Arriving late affects you and other patients whose appointments are after yours.  Also, if you miss three or more appointments without notifying the office, you may be dismissed from the clinic at the provider's discretion.    Again, thank you for choosing [facility].  Our hope is that these requests will decrease the amount of time that you wait before being seen by our physicians.   If you have a lab appointment with the Cancer Center - please note that after April 8th, all labs will be drawn in the cancer center.  You do not have to check in or register with the main entrance as you have in the past but will complete your check-in at the cancer center.            _____________________________________________________________  Should you have questions after your visit to [facility], please contact our office at [phone] and follow the prompts.  Our office hours are 8:00 a.m. to 4:30 p.m. Monday - Thursday and 8:00 a.m. to 2:30 p.m. Friday.  Please note that voicemails left after 4:00 p.m. may not be returned until the following business day.  We are closed weekends and all major holidays.  You do have access to a nurse 24-7, just call the main number to the clinic [phone] and do not press any options, hold on the line  and a nurse will answer the phone.    For prescription refill requests, have your pharmacy contact our office and allow 72 hours.    Masks are no longer required in the cancer centers. If you would like for your care team to wear a mask while they are taking care of you, please let them know. You may have one support person who is at least 33 years old accompany you for your appointments.

## 2023-10-19 NOTE — Progress Notes (Signed)
 [facility] [address] [place] [address]    Clinic Day:  10/19/23   Referring physician: Albertha Huger, FNP  Patient Care Team: Albertha Huger, FNP as PCP - General (Family Medicine) Gerhard Knuckles, RN as Oncology Nurse Navigator (Oncology) Paulett Boros, MD as Medical Oncologist (Oncology) Wendel Hals, NP as Nurse Practitioner (Endocrinology)   ASSESSMENT & PLAN:   Assessment: 1.  Stage III IgA kappa multiple myeloma: -[facility] [place]  with confusion and weakness. -Found to have diabetic ketoacidosis with newly diagnosed type 1 diabetes. -Also found to have acute renal failure and hypercalcemia. -Bone marrow biopsy on 02/25/2020 with flow cytometry with the population CD138 positive, CD56 positive IgA kappa monoclonal plasma cells 12%.  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 -Ultrasound abdomen on 02/23/2020 showed increased hepatic echogenicity with normal spleen. -Renal ultrasound on 02/24/2020 was normal. -CT chest on 02/22/2020 with moth eaten appearance of the bones. -Beta-2  microglobulin 2.1.  LDH 322.  SPEP and immunofixation were normal.  Kappa light chains are elevated at 36.8.  Lambda light chains 12.9 and ratio of 2.85. -24-hour urine immunofixation was negative.  M spike was negative. -PET scan on 03/23/2020 showed no FDG radiotracer activity, no soft tissue plasmacytoma.  Diffuse multiple small lytic lesions within the pelvis, spine and vertebral bodies consistent with myeloma. -Chromosome analysis was 8, XX.  Multiple myeloma FISH panel was normal. -She is considered high risk based on elevated LDH level. -4 cycles of Dara VRD from 03/31/2020 through 06/12/2020. 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 - BMBX on 12/15/2020 with variably normocellular 30 to 70%  with trilineage hematopoiesis.  No increase in plasma cells.  Myeloma FISH panel was normal.  Chromosome analysis was normal. - MRD results were negative. - Maintenance Revlimid  10 mg 3 weeks on/1 week off started around 12/08/2020.  Revlimid  held on 12/28/2022 when her urine pregnancy test became positive.   2.  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  Non-smoker. -No family history of myeloma or other malignancies.   Plan: 1.  Stage III IgA kappa multiple myeloma: - She has been off of Revlimid  on 12/28/2022 as her urine pregnancy test is positive.  She gave birth to her son in April.  He is doing well.  She is breast-feeding and planning to do for next few months. - We reviewed labs from 10/12/2023: Normal LFTs.  M spike is 0.  CBC was normal.  FLC ratio is normal.  Immunofixation is unremarkable. - Recommend continuing to hold Revlimid  until next visit.  RTC 3 months with repeat myeloma labs.  Restart back on Revlimid  maintenance 10 mg 3 weeks on/1 week off after she stops breast-feeding.   2.  Hypertension: - Continue labetalol  and nifedipine .  Blood pressure today is 126/99.   3.  Bone protection:  - Continue to hold Zometa  until she finishes breast-feeding.  Orders Placed This Encounter  Procedures   CBC with Differential/Platelet    Standing Status:   Future    Expected Date:   01/19/2024    Expiration Date:   10/18/2024    Release to patient:   Immediate   Comprehensive metabolic panel with GFR    Standing Status:   Future    Expected Date:   01/19/2024    Expiration Date:   10/18/2024    Release to  patient:   Immediate   Protein electrophoresis, serum    Standing Status:   Future    Expected Date:   01/19/2024    Expiration Date:   10/18/2024    Release to patient:   Immediate   Kappa/lambda light chains    Standing Status:   Future    Expected Date:   01/19/2024    Expiration Date:   10/18/2024   Immunofixation electrophoresis    Standing Status:    Future    Expected Date:   01/19/2024    Expiration Date:   10/18/2024    Release to patient:   Immediate      I,Helena R Teague,acting as a scribe for Paulett Boros, MD.,have documented all relevant documentation on the behalf of Paulett Boros, MD,as directed by  Paulett Boros, MD while in the presence of Paulett Boros, MD.  I, Paulett Boros MD, have reviewed the above documentation for accuracy and completeness, and I agree with the above.      Paulett Boros, MD   6/5/202512:47 PM  CHIEF COMPLAINT:   Diagnosis: IgA kappa multiple myeloma    Cancer Staging  No matching staging information was found for the patient.    Prior Therapy: 1. Dara RVD 2. bone marrow transplant on 09/10/2020   Current Therapy:  Revlimid  10 mg 3 weeks on/1 week off    HISTORY OF PRESENT ILLNESS:   Oncology History  Multiple myeloma (HCC)  03/09/2020 Initial Diagnosis   Multiple myeloma (HCC)   03/31/2020 - 06/12/2020 Chemotherapy            INTERVAL HISTORY:   Diamond Robbins is a 33 y.o. female presenting to clinic today for follow up of IgA kappa multiple myeloma. She was last seen by me on 06/06/23.  Since her last visit, she gave birth on 08/10/23.   Today, she states that she is doing well overall. Her appetite level is at 100%. Her energy level is at 100%. Diamond Robbins has been off of Revlimid  for around 10 months. She denies any side effects from Revlimid  when taking it. She denies any new onset pains.   Diamond Robbins is currently breastfeeding her newborn and plans to do so for at least the next 6 months.   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   Hypertension    Type 1 diabetes mellitus (HCC) 02/23/2020   Vitamin D  deficiency 06/21/2021    Surgical  History: Past Surgical History:  Procedure Laterality Date   BONE MARROW TRANSPLANT     NO PAST SURGERIES      Social History: Social History   Socioeconomic History   Marital status: Married    Spouse name: Ygnacio Hemming   Number of children: Not on file   Years of education: 12   Highest education level: High school graduate  Occupational History   Occupation: unemployed    Comment: can go back to work Jan 2023  Tobacco Use   Smoking status: Never    Passive exposure: Never   Smokeless tobacco: Never  Vaping Use   Vaping status: Never Used  Substance and Sexual Activity   Alcohol use: Not Currently    Comment: occ- 0-1 drinks per week; maybe 1 drink every 6 months; liquor and beer   Drug use: No   Sexual activity: Not  Currently    Birth control/protection: Pill    Comment: FOB has a vasectomy scheduled in two weeks  Other Topics Concern   Not on file  Social History Narrative   Has 2 step-daughters   Social Drivers of Health   Financial Resource Strain: Low Risk  (02/28/2023)   Overall Financial Resource Strain (CARDIA)    Difficulty of Paying Living Expenses: Not hard at all  Food Insecurity: No Food Insecurity (08/09/2023)   Hunger Vital Sign    Worried About Running Out of Food in the Last Year: Never true    Ran Out of Food in the Last Year: Never true  Transportation Needs: No Transportation Needs (08/09/2023)   PRAPARE - Administrator, Civil Service (Medical): No    Lack of Transportation (Non-Medical): No  Physical Activity: Insufficiently Active (02/28/2023)   Exercise Vital Sign    Days of Exercise per Week: 2 days    Minutes of Exercise per Session: 10 min  Stress: No Stress Concern Present (02/28/2023)   [employer] of Occupational Health - Occupational Stress Questionnaire    Feeling of Stress : Not at all  Social Connections: Socially Integrated (02/28/2023)   Social Connection and Isolation Panel [NHANES]    Frequency of Communication  with Friends and Family: Three times a week    Frequency of Social Gatherings with Friends and Family: Once a week    Attends Religious Services: More than 4 times per year    Active Member of [employer] or Organizations: Yes    Attends Banker Meetings: 1 to 4 times per year    Marital Status: Married  Catering manager Violence: Not At Risk (08/09/2023)   Humiliation, Afraid, Rape, and Kick questionnaire    Fear of Current or Ex-Partner: No    Emotionally Abused: No    Physically Abused: No    Sexually Abused: No    Family History: Family History  Problem Relation Age of Onset   Diabetes Paternal Grandfather    Hypertension Paternal Grandfather    Hypertension Paternal Grandmother    Other Paternal Grandmother    Diabetes Maternal Grandmother    Stroke Maternal Grandmother    Hypertension Maternal Grandmother    Heart attack Maternal Grandfather    Diabetes Maternal Grandfather    Hypertension Maternal Grandfather    Diabetes Father    Hypertension Father    Miscarriages / Stillbirths Mother    Hypertension Mother     Current Medications:  Current Outpatient Medications:    ACCU-CHEK GUIDE test strip, Use to check blood glucose 4 times daily., Disp: 125 each, Rfl: 1   Accu-Chek Softclix Lancets lancets, Use to check blood glucose 4 times daily., Disp: 200 each, Rfl: 0   acetaminophen  (TYLENOL ) 325 MG tablet, Take 2 tablets (650 mg total) by mouth every 6 (six) hours as needed (for pain scale < 4)., Disp: , Rfl:    Blood Glucose Monitoring Suppl (ACCU-CHEK GUIDE) w/Device KIT, , Disp: , Rfl:    Blood Pressure Monitor KIT, Use to check blood pressure as instructed, Disp: 1 kit, Rfl: 0   calcium  carbonate (OS-CAL) 1250 (500 Ca) MG chewable tablet, Chew 1 tablet by mouth daily., Disp: , Rfl:    cetirizine  (ZYRTEC ) 10 MG tablet, Take 1 tablet by mouth once daily, Disp: 30 tablet, Rfl: 0   cholecalciferol  (VITAMIN D3) 25 MCG (1000 UNIT) tablet, Take 1,000 Units by mouth  daily., Disp: , Rfl:    Continuous Glucose Sensor (DEXCOM  G6 SENSOR) MISC, Change sensor every 10 days as directed, Disp: 9 each, Rfl: 3   Continuous Glucose Transmitter (DEXCOM G6 TRANSMITTER) MISC, Change transmitter every 90 days as directed., Disp: 1 each, Rfl: 3   fluticasone  (FLONASE ) 50 MCG/ACT nasal spray, Place 2 sprays into both nostrils daily., Disp: 16 g, Rfl: 6   HUMALOG  100 UNIT/ML injection, 3 (three) times daily., Disp: , Rfl:    ibuprofen  (ADVIL ) 600 MG tablet, Take 1 tablet (600 mg total) by mouth every 6 (six) hours., Disp: 30 each, Rfl: 0   Insulin  Disposable Pump (OMNIPOD 5 DEXG7G6 INTRO GEN 5) KIT, by Does not apply route., Disp: , Rfl:    Insulin  Pen Needle 32G X 4 MM MISC, Use to inject insulin  as directed, Disp: 100 each, Rfl: 1   Insulin  Syringe-Needle U-100 (INSULIN  SYRINGE .3CC/31GX5/16") 31G X 5/16" 0.3 ML MISC, 100 each by Does not apply route daily., Disp: 100 each, Rfl: 3   NIFEdipine  (PROCARDIA  XL) 30 MG 24 hr tablet, Take 1 tablet (30 mg total) by mouth daily., Disp: 30 tablet, Rfl: 2   norethindrone  (ORTHO MICRONOR ) 0.35 MG tablet, Take 1 tablet (0.35 mg total) by mouth daily., Disp: 90 tablet, Rfl: 4   Prenatal Vit-Fe Fumarate-FA (PRENATAL VITAMIN PO), Take by mouth., Disp: , Rfl:    labetalol  (NORMODYNE ) 300 MG tablet, Take 1 tablet (300 mg total) by mouth 2 (two) times daily., Disp: 60 tablet, Rfl: 0   Allergies: No Known Allergies  REVIEW OF SYSTEMS:   Review of Systems  Constitutional:  Negative for chills, fatigue and fever.  HENT:   Negative for lump/mass, mouth sores, nosebleeds, sore throat and trouble swallowing.   Eyes:  Negative for eye problems.  Respiratory:  Negative for cough and shortness of breath.   Cardiovascular:  Negative for chest pain, leg swelling and palpitations.  Gastrointestinal:  Negative for abdominal pain, constipation, diarrhea, nausea and vomiting.  Genitourinary:  Negative for bladder incontinence, difficulty urinating,  dysuria, frequency, hematuria and nocturia.   Musculoskeletal:  Negative for arthralgias, back pain, flank pain, myalgias and neck pain.  Skin:  Negative for itching and rash.  Neurological:  Negative for dizziness, headaches and numbness.  Hematological:  Does not bruise/bleed easily.  Psychiatric/Behavioral:  Negative for depression, sleep disturbance and suicidal ideas. The patient is not nervous/anxious.   All other systems reviewed and are negative.    VITALS:   Blood pressure (!) 132/103, pulse 86, temperature (!) 97.4 F (36.3 C), temperature source Oral, resp. rate 20, weight 179 lb 7.3 oz (81.4 kg), SpO2 99%, currently breastfeeding.  Wt Readings from Last 3 Encounters:  10/19/23 179 lb 7.3 oz (81.4 kg)  08/17/23 192 lb (87.1 kg)  08/09/23 197 lb 6.4 oz (89.5 kg)    Body mass index is 31.79 kg/m.  Performance status (ECOG): 1 - Symptomatic but completely ambulatory  PHYSICAL EXAM:   Physical Exam Vitals and nursing note reviewed. Exam conducted with a chaperone present.  Constitutional:      Appearance: Normal appearance.  Cardiovascular:     Rate and Rhythm: Normal rate and regular rhythm.     Pulses: Normal pulses.     Heart sounds: Normal heart sounds.  Pulmonary:     Effort: Pulmonary effort is normal.     Breath sounds: Normal breath sounds.  Abdominal:     Palpations: Abdomen is soft. There is no hepatomegaly, splenomegaly or mass.     Tenderness: There is no abdominal tenderness.  Musculoskeletal:  Right lower leg: No edema.     Left lower leg: No edema.  Lymphadenopathy:     Cervical: No cervical adenopathy.     Right cervical: No superficial, deep or posterior cervical adenopathy.    Left cervical: No superficial, deep or posterior cervical adenopathy.     Upper Body:     Right upper body: No supraclavicular or axillary adenopathy.     Left upper body: No supraclavicular or axillary adenopathy.  Neurological:     General: No focal deficit  present.     Mental Status: She is alert and oriented to person, place, and time.  Psychiatric:        Mood and Affect: Mood normal.        Behavior: Behavior normal.     LABS:      Latest Ref Rng & Units 10/12/2023    9:46 AM 08/11/2023    5:11 AM 08/10/2023    4:50 PM  CBC  WBC 4.0 - 10.5 K/uL 4.5  10.4  6.1   Hemoglobin 12.0 - 15.0 g/dL 16.1  09.6  04.5   Hematocrit 36.0 - 46.0 % 42.7  37.8  38.1   Platelets 150 - 400 K/uL 313  278  272       Latest Ref Rng & Units 10/12/2023    9:46 AM 08/09/2023    3:29 PM 05/30/2023    2:53 PM  CMP  Glucose 70 - 99 mg/dL 409  811  914   BUN 6 - 20 mg/dL 12  9  6    Creatinine 0.44 - 1.00 mg/dL 7.82  9.56  2.13   Sodium 135 - 145 mmol/L 137  134  133   Potassium 3.5 - 5.1 mmol/L 3.8  3.9  3.6   Chloride 98 - 111 mmol/L 103  104  104   CO2 22 - 32 mmol/L 22  18  19    Calcium  8.9 - 10.3 mg/dL 9.4  9.8  9.6   Total Protein 6.5 - 8.1 g/dL 7.9  7.0  7.6   Total Bilirubin 0.0 - 1.2 mg/dL 0.7  0.6  0.5   Alkaline Phos 38 - 126 U/L 66  123  59   AST 15 - 41 U/L 26  22  17    ALT 0 - 44 U/L 41  19  19      No results found for: "CEA1", "CEA" / No results found for: "CEA1", "CEA" No results found for: "PSA1" No results found for: "CAN199" No results found for: "CAN125"  Lab Results  Component Value Date   TOTALPROTELP 7.7 10/12/2023   TOTALPROTELP 7.9 10/12/2023   ALBUMINELP 4.0 10/12/2023   A1GS 0.2 10/12/2023   A2GS 0.9 10/12/2023   BETS 1.4 (H) 10/12/2023   GAMS 1.2 10/12/2023   MSPIKE Not Observed 10/12/2023   SPEI Comment 10/12/2023   No results found for: "TIBC", "FERRITIN", "IRONPCTSAT" Lab Results  Component Value Date   LDH 117 05/30/2023   LDH 114 02/14/2023   LDH 152 11/24/2021     STUDIES:   No results found.

## 2023-10-19 NOTE — Progress Notes (Signed)
 Patient presents today for MMR vaccination per providers order.  Patient seen be the MD.  Message received from [facility] LPN/Dr. Cheree Cords, patient okay for injection.  Stable during administration without incident; injection site WNL; see MAR for injection details.  Patient tolerated procedure well and without incident.  No questions or complaints noted at this time.

## 2023-10-26 DIAGNOSIS — Z419 Encounter for procedure for purposes other than remedying health state, unspecified: Secondary | ICD-10-CM | POA: Diagnosis not present

## 2023-11-25 DIAGNOSIS — Z419 Encounter for procedure for purposes other than remedying health state, unspecified: Secondary | ICD-10-CM | POA: Diagnosis not present

## 2023-12-05 ENCOUNTER — Other Ambulatory Visit: Payer: Self-pay | Admitting: Family Medicine

## 2023-12-05 DIAGNOSIS — E1059 Type 1 diabetes mellitus with other circulatory complications: Secondary | ICD-10-CM

## 2023-12-05 DIAGNOSIS — Z3490 Encounter for supervision of normal pregnancy, unspecified, unspecified trimester: Secondary | ICD-10-CM

## 2023-12-06 ENCOUNTER — Encounter: Payer: Self-pay | Admitting: Obstetrics & Gynecology

## 2023-12-06 ENCOUNTER — Encounter: Payer: Self-pay | Admitting: Family Medicine

## 2023-12-07 ENCOUNTER — Other Ambulatory Visit: Payer: Self-pay | Admitting: Obstetrics & Gynecology

## 2023-12-07 MED ORDER — LABETALOL HCL 300 MG PO TABS: 300.0000 mg | ORAL_TABLET | Freq: Two times a day (BID) | ORAL | 6 refills | Status: AC

## 2023-12-07 NOTE — Progress Notes (Signed)
 Refill for BP meds until PCP appt  Gwenivere Hiraldo, DO Attending Obstetrician & Gynecologist, [facility] for [facility], [facility]

## 2023-12-19 ENCOUNTER — Encounter: Payer: Self-pay | Admitting: Oncology

## 2023-12-23 ENCOUNTER — Other Ambulatory Visit: Payer: Self-pay | Admitting: Nurse Practitioner

## 2023-12-25 ENCOUNTER — Other Ambulatory Visit: Payer: Self-pay

## 2023-12-25 DIAGNOSIS — C9001 Multiple myeloma in remission: Secondary | ICD-10-CM

## 2023-12-26 ENCOUNTER — Other Ambulatory Visit: Payer: Self-pay

## 2023-12-26 ENCOUNTER — Inpatient Hospital Stay: Attending: Hematology

## 2023-12-26 DIAGNOSIS — C9001 Multiple myeloma in remission: Secondary | ICD-10-CM

## 2023-12-26 DIAGNOSIS — C9000 Multiple myeloma not having achieved remission: Secondary | ICD-10-CM | POA: Diagnosis not present

## 2023-12-26 DIAGNOSIS — Z419 Encounter for procedure for purposes other than remedying health state, unspecified: Secondary | ICD-10-CM | POA: Diagnosis not present

## 2023-12-26 LAB — PREGNANCY, URINE: Preg Test, Ur: NEGATIVE

## 2023-12-29 ENCOUNTER — Other Ambulatory Visit: Payer: Self-pay

## 2023-12-29 ENCOUNTER — Telehealth: Payer: Self-pay | Admitting: Pharmacy Technician

## 2023-12-29 ENCOUNTER — Other Ambulatory Visit (HOSPITAL_COMMUNITY): Payer: Self-pay

## 2023-12-29 MED ORDER — LENALIDOMIDE 10 MG PO CAPS: 10.0000 mg | ORAL_CAPSULE | Freq: Every day | ORAL | 0 refills | Status: AC

## 2023-12-29 NOTE — Telephone Encounter (Signed)
 Oral Oncology Patient Advocate Encounter  After completing a benefits investigation, prior authorization for Lenalidomide  is not required at this time through [facility].  Patient's copay is $4.     Giuliano Preece (Patty) Chet Burnet, CPhT  [facility], [place], [place] Oral Chemotherapy Patient Advocate Specialist III Phone: [phone]  Fax: [phone]

## 2023-12-29 NOTE — Telephone Encounter (Signed)
 Revlimid  prescription sent per verbal order from Dr. Davonna.

## 2024-01-01 ENCOUNTER — Other Ambulatory Visit: Payer: Self-pay | Admitting: *Deleted

## 2024-01-02 NOTE — Progress Notes (Signed)
 Call received from [facility] stating that patient's current oral contraceptive is not highly effective in order to fill Revlimid . Per the REMS program, highly effective, the patient can be on IUD, injections, implants or combined oral contraceptive. Patient states her preference would be COC. Patient's OBGYN made aware who explains that COC is not the best option for this patient due to other chronic health conditions. This was subsequently explained to the patient. Patient states that she will discuss her options with her husband and let me know what she decides. Pharmacist at [facility] will place the patient's prescription temporarily on hold until the patient calls back. Dr. Davonna made aware.

## 2024-01-18 ENCOUNTER — Inpatient Hospital Stay

## 2024-01-24 ENCOUNTER — Inpatient Hospital Stay: Admitting: Oncology

## 2024-01-25 ENCOUNTER — Inpatient Hospital Stay: Admitting: Oncology

## 2024-01-26 DIAGNOSIS — Z419 Encounter for procedure for purposes other than remedying health state, unspecified: Secondary | ICD-10-CM | POA: Diagnosis not present

## 2024-02-09 ENCOUNTER — Inpatient Hospital Stay: Attending: Hematology

## 2024-02-09 DIAGNOSIS — C9000 Multiple myeloma not having achieved remission: Secondary | ICD-10-CM | POA: Insufficient documentation

## 2024-02-09 DIAGNOSIS — C9001 Multiple myeloma in remission: Secondary | ICD-10-CM

## 2024-02-09 LAB — CBC WITH DIFFERENTIAL/PLATELET
Abs Immature Granulocytes: 0.02 K/uL (ref 0.00–0.07)
Basophils Absolute: 0 K/uL (ref 0.0–0.1)
Basophils Relative: 1 %
Eosinophils Absolute: 0.4 K/uL (ref 0.0–0.5)
Eosinophils Relative: 8 %
HCT: 42.5 % (ref 36.0–46.0)
Hemoglobin: 14.1 g/dL (ref 12.0–15.0)
Immature Granulocytes: 0 %
Lymphocytes Relative: 39 %
Lymphs Abs: 2.2 K/uL (ref 0.7–4.0)
MCH: 28.5 pg (ref 26.0–34.0)
MCHC: 33.2 g/dL (ref 30.0–36.0)
MCV: 85.9 fL (ref 80.0–100.0)
Monocytes Absolute: 0.4 K/uL (ref 0.1–1.0)
Monocytes Relative: 8 %
Neutro Abs: 2.5 K/uL (ref 1.7–7.7)
Neutrophils Relative %: 44 %
Platelets: 282 K/uL (ref 150–400)
RBC: 4.95 MIL/uL (ref 3.87–5.11)
RDW: 12.1 % (ref 11.5–15.5)
WBC: 5.7 K/uL (ref 4.0–10.5)
nRBC: 0 % (ref 0.0–0.2)

## 2024-02-09 LAB — COMPREHENSIVE METABOLIC PANEL WITH GFR
ALT: 41 U/L (ref 0–44)
AST: 30 U/L (ref 15–41)
Albumin: 4.1 g/dL (ref 3.5–5.0)
Alkaline Phosphatase: 63 U/L (ref 38–126)
Anion gap: 12 (ref 5–15)
BUN: 10 mg/dL (ref 6–20)
CO2: 24 mmol/L (ref 22–32)
Calcium: 9.2 mg/dL (ref 8.9–10.3)
Chloride: 103 mmol/L (ref 98–111)
Creatinine, Ser: 0.66 mg/dL (ref 0.44–1.00)
GFR, Estimated: >60 mL/min (ref >60)
Glucose, Bld: 161 mg/dL — ABNORMAL HIGH (ref 70–99)
Potassium: 3.7 mmol/L (ref 3.5–5.1)
Sodium: 139 mmol/L (ref 135–145)
Total Bilirubin: 0.8 mg/dL (ref 0.0–1.2)
Total Protein: 8 g/dL (ref 6.5–8.1)

## 2024-02-12 ENCOUNTER — Other Ambulatory Visit: Payer: Self-pay | Admitting: Nurse Practitioner

## 2024-02-12 LAB — KAPPA/LAMBDA LIGHT CHAINS
Kappa free light chain: 11.3 mg/L (ref 3.3–19.4)
Kappa, lambda light chain ratio: 1.53 (ref 0.26–1.65)
Lambda free light chains: 7.4 mg/L (ref 5.7–26.3)

## 2024-02-12 NOTE — Telephone Encounter (Signed)
 See message.

## 2024-02-12 NOTE — Telephone Encounter (Signed)
 Patient needs follow up appointment.  We can give a courtesy refill to last until the appt but she needs to keep it for further refills.

## 2024-02-12 NOTE — Telephone Encounter (Signed)
I sent her a my chart message.

## 2024-02-13 ENCOUNTER — Other Ambulatory Visit: Payer: Self-pay | Admitting: Nurse Practitioner

## 2024-02-13 LAB — PROTEIN ELECTROPHORESIS, SERUM
A/G Ratio: 1 (ref 0.7–1.7)
Albumin ELP: 3.7 g/dL (ref 2.9–4.4)
Alpha-1-Globulin: 0.2 g/dL (ref 0.0–0.4)
Alpha-2-Globulin: 0.9 g/dL (ref 0.4–1.0)
Beta Globulin: 1.4 g/dL — ABNORMAL HIGH (ref 0.7–1.3)
Gamma Globulin: 1.3 g/dL (ref 0.4–1.8)
Globulin, Total: 3.8 g/dL (ref 2.2–3.9)
Total Protein ELP: 7.5 g/dL (ref 6.0–8.5)

## 2024-02-13 LAB — IMMUNOFIXATION ELECTROPHORESIS
IgA: 125 mg/dL (ref 87–352)
IgG (Immunoglobin G), Serum: 1385 mg/dL (ref 586–1602)
IgM (Immunoglobulin M), Srm: 31 mg/dL (ref 26–217)
Total Protein ELP: 7.4 g/dL (ref 6.0–8.5)

## 2024-02-13 MED ORDER — INSULIN LISPRO 100 UNIT/ML IJ SOLN: INTRAMUSCULAR | 0 refills | Status: DC

## 2024-02-13 NOTE — Telephone Encounter (Signed)
 Noted

## 2024-02-13 NOTE — Progress Notes (Signed)
 Sent in temporary refill of Humalog  vials to local pharmacy to last her until her next follow up.  She is aware to keep that appt for future refills.

## 2024-02-13 NOTE — Telephone Encounter (Signed)
 I sent in a refill on humalog  for her Omnipod to [facility].

## 2024-02-18 NOTE — Progress Notes (Incomplete)
 Patient Care Team: Diamond Annabella HERO, FNP as PCP - General (Family Medicine) Diamond Diamond SQUIBB, RN as Oncology Nurse Navigator (Oncology) Diamond Benton PARAS, NP as Nurse Practitioner (Endocrinology)  Clinic Day:  02/18/2024  Referring physician: Joesph Annabella HERO, FNP   CHIEF COMPLAINT:  CC: Stage III IgA kappa multiple myeloma   Diamond Robbins 33 y.o. female was transferred to my care after her prior physician has left.   ASSESSMENT & PLAN:   Assessment & Plan: Diamond Robbins  is a 33 y.o. female with ***  Assessment & Plan     The patient understands the plans discussed today and is in agreement with them.  She knows to contact our office if she develops concerns prior to her next appointment.  *** minutes of total time was spent for this patient encounter, including preparation,review of records,  face-to-face counseling with the patient and coordination of care, physical exam, and documentation of the encounter.    Diamond Robbins,acting as a Neurosurgeon for Mickiel Dry, MD.,have documented all relevant documentation on the behalf of Mickiel Dry, MD,as directed by  Mickiel Dry, MD while in the presence of Mickiel Dry, MD.  ***  Diamond Robbins [facility] [place] - A DEPT OF Diamond Robbins [facility] [address] [place] [address] Dept: [phone] Dept Fax: [phone]   No orders of the defined types were placed in this encounter.    ONCOLOGY HISTORY:   I have reviewed her chart and materials related to her cancer extensively and collaborated history with the patient. Summary of oncologic history is as follows:   Diagnosis: Stage III IgA kappa multiple myeloma   -Presentation: confusion and weakness, diabetic ketoacidosis resulting in newly diagnosed DM type I, as well as renal failure and hypercalcemia per documentation -02/22/2020: CT chest: *** -02/23/2020: US  abdomen: *** -02/24/2020: US   renal: *** -02/25/2020: Bone Marrow Biopsy.   Pathology: ***  Chromosome Analysis: Normal  Myeloma FISH: *** -***: Myeloma labs. *** -24-hour urine immunofixation was negative.  M spike was negative.  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  -12/15/2020: Bone Marrow Biopsy.   Pathology: *** -MRD results were negative.  -12/08/2020-12/27/2020: Revlimid  10 mg 3 weeks on/1 week off, temporarily held after positive urine pregnancy. Patient gave birth in April and breast fed her son.    Current Treatment:  ***  INTERVAL HISTORY:   Diamond Robbins is here today for follow up and to establish care with me for ***. Patient is accompanied by *** .     I have reviewed the past medical history, past surgical history, social history and family history with the patient and they are unchanged from previous note.  ALLERGIES:  has no known allergies.  MEDICATIONS:  Current Outpatient Medications  Medication Sig Dispense Refill   Blood Glucose Monitoring Suppl (ACCU-CHEK GUIDE) w/Device KIT      Blood Pressure Monitor KIT Use to check blood pressure as instructed 1 kit 0   calcium  carbonate (OS-CAL) 1250 (500 Ca) MG chewable tablet Chew 1 tablet by mouth daily.     cetirizine  (ZYRTEC ) 10 MG tablet Take 1 tablet by mouth once daily 30 tablet 0   cholecalciferol  (VITAMIN D3) 25 MCG (1000 UNIT) tablet Take 1,000 Units by mouth daily.     Continuous Glucose Sensor (DEXCOM G6 SENSOR) MISC USE TO CHECK GLUCOSE CONTINUOUSLY CHANGING  SENSOR  EVERY  10  DAYS  AS DIRECTED 9 each 0   Continuous Glucose Transmitter (DEXCOM G6 TRANSMITTER) MISC Change transmitter every 90 days as directed. 1 each 3   fluticasone  (FLONASE ) 50 MCG/ACT nasal spray Place 2 sprays into both nostrils daily. 16 g 6   ibuprofen  (ADVIL ) 600 MG tablet Take 1 tablet (600 mg total) by mouth every 6 (six) hours. 30 each 0   Insulin  Disposable Pump (OMNIPOD 5  DEXG7G6 INTRO GEN 5) KIT by Does not apply route.     insulin  lispro (HUMALOG ) 100 UNIT/ML injection Use with Omnipod for TDD around 70 units daily. 60 mL 0   labetalol  (NORMODYNE ) 300 MG tablet Take 1 tablet (300 mg total) by mouth 2 (two) times daily. 60 tablet 6   lenalidomide  (REVLIMID ) 10 MG capsule Take 1 capsule (10 mg total) by mouth daily. 21 days on/7 days off 21 capsule 0   NIFEdipine  (PROCARDIA  XL) 30 MG 24 hr tablet Take 1 tablet (30 mg total) by mouth daily. 30 tablet 2   No current facility-administered medications for this visit.    REVIEW OF SYSTEMS:   Constitutional: Denies fevers, chills or abnormal weight loss Eyes: Denies blurriness of vision Ears, nose, mouth, throat, and face: Denies mucositis or sore throat Respiratory: Denies cough, dyspnea or wheezes Cardiovascular: Denies palpitation, chest discomfort or lower extremity swelling Gastrointestinal:  Denies nausea, heartburn or change in bowel habits Skin: Denies abnormal skin rashes Lymphatics: Denies new lymphadenopathy or easy bruising Neurological:Denies numbness, tingling or new weaknesses Behavioral/Psych: Mood is stable, no new changes  All other systems were reviewed with the patient and are negative.   VITALS:  currently breastfeeding.  Wt Readings from Last 3 Encounters:  10/19/23 179 lb 7.3 oz (81.4 kg)  08/17/23 192 lb (87.1 kg)  08/09/23 197 lb 6.4 oz (89.5 kg)    There is no height or weight on file to calculate BMI.  Performance status (ECOG): {CHL ONC [id number]  PHYSICAL EXAM:   GENERAL:alert, no distress and comfortable SKIN: skin color, texture, turgor are normal, no rashes or significant lesions EYES: normal, Conjunctiva are pink and non-injected, sclera clear OROPHARYNX:no exudate, no erythema and lips, buccal mucosa, and tongue normal  NECK: supple, thyroid  normal size, non-tender, without nodularity LYMPH:  no palpable lymphadenopathy in the cervical, axillary or  inguinal LUNGS: clear to auscultation and percussion with normal breathing effort HEART: regular rate & rhythm and no murmurs and no lower extremity edema ABDOMEN:abdomen soft, non-tender and normal bowel sounds Musculoskeletal:no cyanosis of digits and no clubbing  NEURO: alert & oriented x 3 with fluent speech, no focal motor/sensory deficits  LABORATORY DATA:  I have reviewed the data as listed  No results found for: SPEP, UPEP  Lab Results  Component Value Date   WBC 5.7 02/09/2024   NEUTROABS 2.5 02/09/2024   HGB 14.1 02/09/2024   HCT 42.5 02/09/2024   MCV 85.9 02/09/2024   PLT 282 02/09/2024    @LASTCHEMISTRY @   RADIOGRAPHIC STUDIES: I have personally reviewed the radiological images as listed and agreed with the findings in the report. US  UA Cord Doppler, US  Fetal BPP W/O Non Stress Table formatting from the original result was not included. Images from the original result were not included.    ..an Financial trader of Ultrasound Medicine Technical sales engineer) accredited practice  [facility] [address] [place] [address]  Ordering Provider: Ozan, Jennifer, DO  FOLLOW UP SONOGRAM  Mechelle Pates is in the office for a follow up  sonogram for BPP/UAD.  She is a 32 y.o. year old G2P0010 with Estimated Date of Delivery: 09/04/23  by LMP now at  [redacted]w[redacted]d weeks gestation. Thus far the pregnancy has been  complicated by severe FGR/ 2VC/ marg pl ci/T1DM .  GESTATION: SINGLETON  PRESENTATION: cephalic  FETAL ACTIVITY:          Heart rate         152 bpm          The fetus is active.  AMNIOTIC FLUID: The amniotic fluid volume is  low range normal, AFI = 8.7 cm, 8.9%, MVP =  2.3 cm.  PLACENTA LOCALIZATION:  Fundal posterior left with marginal pl ci seen at superior margin, Grade  3 GRADE 3  CERVIX: Limited view  ADNEXA: Limited view  GESTATIONAL AGE AND  BIOMETRICS:  Gestational criteria: Estimated Date of  Delivery: 09/04/23 by LMP now at  [redacted]w[redacted]d  Previous Scans:11  BIOPHYSICAL PROFILE:                                                                                                       COMMENTS GROSS BODY MOVEMENT                 2   TONE                2   RESPIRATIONS                2   AMNIOTIC FLUID                2                                                            SCORE:  8/8 (Note: NST was not performed as part of this antepartum testing)    DOPPLER FLOW STUDIES: UMBILICAL ARTERY RI RATIOS:   RI: 0.85, 0.83, 081  avg RI = 0.83 > 98%                                                   no evidence of absent  end diastolic flow, no evidence of reverse flow                                                  Cord dopplers performed  with patient in Left decubitus position  ANATOMICAL SURVEY  COMMENTS                                    NASAL BONE yes normal        FACIAL PROFILE yes normal   4 CHAMBERED HEART yes normal   OUTFLOW TRACTS YES normaL             SITUS YES NORMAL        DIAPHRAGM yes normal   STOMACH yes normal   RENAL REGION yes normal   BLADDER yes normal             3 VESSEL CORD  Two vessel cord   SPINE yes normal   ARMS/HANDS yes normal   LEGS/FEET yes normal   GENITALIA yes normal female        SUSPECTED ABNORMALITIES:  Severe FGR  low normal range amn fluid 8.9%  MVP = 2.3 cm  QUALITY OF SCAN: satisfactory  TECHNICIAN COMMENTS:  US : GA = 36+2 weeks Single active female fetus, cephalic, FHR = 152 bpm  AFI = 8.7 cm, 8.9%, MVP  = 2.3 cm BPP = 8/8, excellent respirations observed - RI: 0.85, 0.83, 081  avg RI =  0.83 > 98% S/D = 5.96  PI = 1.56  -  no evidence of absent end diastolic flow, no  evidence of reverse flow  A copy of this report including all images has been saved and backed up to  a second source for retrieval if needed. All measures and details  of the  anatomical scan, placentation, fluid volume and pelvic anatomy are  contained in that report.  Sharlet GORMAN Fair 08/09/2023 12:29 PM  Clinical Impression and recommendations:  I have reviewed the sonogram results above, combined with the patient's  current clinical course, below are my impressions and any appropriate  recommendations for management based on the sonographic findings.  1.  [id number] Estimated Date of Delivery: 09/04/23 by serial sonographic  evaluations   2.  Fetal sonographic surveillance findings:   a). Normal fluid volume b). Normal antepartum fetal assessment with BPP 8/8  c). Elevated fetal Doppler ratios with consistent diastolic flow:  98%  3.  Normal general sonographic findings  Recommend prenatal evaluations and care based on this sonogram and as  clinically indicated from the patient's clinical course.  Jennifer Ozan, DO Attending Obstetrician & Gynecologist, [facility] for [employer], [facility]

## 2024-02-19 ENCOUNTER — Inpatient Hospital Stay: Attending: Hematology | Admitting: Oncology

## 2024-02-20 NOTE — Progress Notes (Incomplete)
 Patient Care Team: Diamond Annabella HERO, FNP as PCP - General (Family Medicine) Diamond Diamond SQUIBB, RN as Oncology Nurse Navigator (Oncology) Diamond Benton PARAS, NP as Nurse Practitioner (Endocrinology)  Clinic Day:  02/20/2024  Referring physician: Joesph Annabella HERO, FNP   CHIEF COMPLAINT:  CC: Stage III IgA kappa multiple myeloma   Diamond Robbins 33 y.o. female was transferred to my care after her prior physician has left.   ASSESSMENT & PLAN:   Assessment & Plan: Diamond Robbins  is a 33 y.o. female with ***  Assessment & Plan     The patient understands the plans discussed today and is in agreement with them.  She knows to contact our office if she develops concerns prior to her next appointment.  *** minutes of total time was spent for this patient encounter, including preparation,review of records,  face-to-face counseling with the patient and coordination of care, physical exam, and documentation of the encounter.    Diamond Robbins,acting as a Neurosurgeon for Mickiel Dry, MD.,have documented all relevant documentation on the behalf of Mickiel Dry, MD,as directed by  Mickiel Dry, MD while in the presence of Mickiel Dry, MD.  ***  Diamond Robbins  Diamond Robbins [facility] [place] - A DEPT OF Diamond Robbins [address] [place] [address] Dept: [phone] Dept Fax: [phone]   No orders of the defined types were placed in this encounter.    ONCOLOGY HISTORY:   I have reviewed her chart and materials related to her cancer extensively and collaborated history with the patient. Summary of oncologic history is as follows:   Diagnosis: Stage III IgA kappa multiple myeloma   -Presentation: confusion, weakness, diabetic ketoacidosis resulting in newly diagnosed DM type I, as well as renal failure and hypercalcemia per documentation -02/22/2020: CT chest: *** -02/23/2020: US  abdomen: *** -02/24/2020: US   renal: *** -02/25/2020: Bone Marrow Biopsy.  Pathology: Extensive marrow involvement by kappa restricted multiple myeloma (95%). Expanded population of bright CD138+ aberrant CD56+ IgA kappa monoclonal plasma cells (12%) supportive of plasma cell neoplasm.   Chromosome Analysis: Normal  Myeloma FISH: Negative -03/11/2020:  LDH 322. Normal Beta-2  microglobulin. Normal immunofixation. Mild anemia with HGB at 11.7. UPEP with elevated total protein/day at 251 and elevated FLLC at 21.67 -03/22/2020: SPEP negative. Elevated KFLC at 36.8 with ratio at 2.85.  -03/23/2020: Initial PET: No focal FDG radiotracer activity to suggest hypermetabolic active multiple myeloma. No soft tissue plasmacytoma. Diffuse multiple small lytic lesions within the pelvis, spine and vertebral bodies is a pattern typical of multiple myeloma. -03/31/2020-06/12/2020: 4 cycles of Dara VRD  -07/20/2020: Immunofixation shows IgG monoclonal protein with kappa light chain specificity.  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  -12/15/2020: Bone Marrow Biopsy.  Pathology: Variably normocellular (30-70%) trilineage hematopoiesis with maturation. Hematogone hyperplasia (~10%). No increase in plasma cells. A distinct population of plasma cells is not identified. Hematogone hyperplasia. No monotypic B-cells present. -MM FISH: Normal -Chromosome Analysis: Normal -MRD results were negative.  -12/08/2020-12/28/2022: Revlimid  10 mg 3 weeks on/1 week off, temporarily held after positive urine pregnancy. Patient gave birth in April and breast fed her son.  -***: Patient restarted Revlimid  10 mg 3 weeks on/1 week off   Current Treatment:  ***  INTERVAL HISTORY:   [name] is here today for follow up and to establish care with me for ***. Patient is accompanied by *** .     I have reviewed the past medical history, past surgical history,  social history and family history with the patient and they are unchanged from previous  note.  ALLERGIES:  has no known allergies.  MEDICATIONS:  Current Outpatient Medications  Medication Sig Dispense Refill   Blood Glucose Monitoring Suppl (ACCU-CHEK GUIDE) w/Device KIT      Blood Pressure Monitor KIT Use to check blood pressure as instructed 1 kit 0   calcium  carbonate (OS-CAL) 1250 (500 Ca) MG chewable tablet Chew 1 tablet by mouth daily.     cetirizine  (ZYRTEC ) 10 MG tablet Take 1 tablet by mouth once daily 30 tablet 0   cholecalciferol  (VITAMIN D3) 25 MCG (1000 UNIT) tablet Take 1,000 Units by mouth daily.     Continuous Glucose Sensor (DEXCOM G6 SENSOR) MISC USE TO CHECK GLUCOSE CONTINUOUSLY CHANGING  SENSOR  EVERY  10  DAYS AS DIRECTED 9 each 0   Continuous Glucose Transmitter (DEXCOM G6 TRANSMITTER) MISC Change transmitter every 90 days as directed. 1 each 3   fluticasone  (FLONASE ) 50 MCG/ACT nasal spray Place 2 sprays into both nostrils daily. 16 g 6   ibuprofen  (ADVIL ) 600 MG tablet Take 1 tablet (600 mg total) by mouth every 6 (six) hours. 30 each 0   Insulin  Disposable Pump (OMNIPOD 5 DEXG7G6 INTRO GEN 5) KIT by Does not apply route.     insulin  lispro (HUMALOG ) 100 UNIT/ML injection Use with Omnipod for TDD around 70 units daily. 60 mL 0   labetalol  (NORMODYNE ) 300 MG tablet Take 1 tablet (300 mg total) by mouth 2 (two) times daily. 60 tablet 6   lenalidomide  (REVLIMID ) 10 MG capsule Take 1 capsule (10 mg total) by mouth daily. 21 days on/7 days off 21 capsule 0   NIFEdipine  (PROCARDIA  XL) 30 MG 24 hr tablet Take 1 tablet (30 mg total) by mouth daily. 30 tablet 2   No current facility-administered medications for this visit.    REVIEW OF SYSTEMS:   Constitutional: Denies fevers, chills or abnormal weight loss Eyes: Denies blurriness of vision Ears, nose, mouth, throat, and face: Denies mucositis or sore throat Respiratory: Denies cough, dyspnea or wheezes Cardiovascular: Denies palpitation, chest discomfort or lower extremity swelling Gastrointestinal:   Denies nausea, heartburn or change in bowel habits Skin: Denies abnormal skin rashes Lymphatics: Denies new lymphadenopathy or easy bruising Neurological:Denies numbness, tingling or new weaknesses Behavioral/Psych: Mood is stable, no new changes  All other systems were reviewed with the patient and are negative.   VITALS:  currently breastfeeding.  Wt Readings from Last 3 Encounters:  10/19/23 179 lb 7.3 oz (81.4 kg)  08/17/23 192 lb (87.1 kg)  08/09/23 197 lb 6.4 oz (89.5 kg)    There is no height or weight on file to calculate BMI.  Performance status (ECOG): {CHL ONC [id number]  PHYSICAL EXAM:   GENERAL:alert, no distress and comfortable SKIN: skin color, texture, turgor are normal, no rashes or significant lesions EYES: normal, Conjunctiva are pink and non-injected, sclera clear OROPHARYNX:no exudate, no erythema and lips, buccal mucosa, and tongue normal  NECK: supple, thyroid  normal size, non-tender, without nodularity LYMPH:  no palpable lymphadenopathy in the cervical, axillary or inguinal LUNGS: clear to auscultation and percussion with normal breathing effort HEART: regular rate & rhythm and no murmurs and no lower extremity edema ABDOMEN:abdomen soft, non-tender and normal bowel sounds Musculoskeletal:no cyanosis of digits and no clubbing  NEURO: alert & oriented x 3 with fluent speech, no focal motor/sensory deficits  LABORATORY DATA:  I have reviewed the data as listed  No results found for:  SPEP, UPEP  Lab Results  Component Value Date   WBC 5.7 02/09/2024   NEUTROABS 2.5 02/09/2024   HGB 14.1 02/09/2024   HCT 42.5 02/09/2024   MCV 85.9 02/09/2024   PLT 282 02/09/2024    @LASTCHEMISTRY @   RADIOGRAPHIC STUDIES: I have personally reviewed the radiological images as listed and agreed with the findings in the report. US  UA Cord Doppler, US  Fetal BPP W/O Non Stress Table formatting from the original result was not included. Images from the  original result were not included.    ..an Financial trader of Ultrasound Medicine Technical sales engineer) accredited practice  [facility] [place] [facility] [address] [place] [address]  Ordering Provider: Ozan, Jennifer, DO  FOLLOW UP SONOGRAM  Charm Stenner is in the office for a follow up sonogram for BPP/UAD.  She is a 33 y.o. year old G2P0010 with Estimated Date of Delivery: 09/04/23  by LMP now at  [redacted]w[redacted]d weeks gestation. Thus far the pregnancy has been  complicated by severe FGR/ 2VC/ marg pl ci/T1DM .  GESTATION: SINGLETON  PRESENTATION: cephalic  FETAL ACTIVITY:          Heart rate         152 bpm          The fetus is active.  AMNIOTIC FLUID: The amniotic fluid volume is  low range normal, AFI = 8.7 cm, 8.9%, MVP =  2.3 cm.  PLACENTA LOCALIZATION:  Fundal posterior left with marginal pl ci seen at superior margin, Grade  3 GRADE 3  CERVIX: Limited view  ADNEXA: Limited view  GESTATIONAL AGE AND  BIOMETRICS:  Gestational criteria: Estimated Date of Delivery: 09/04/23 by LMP now at  [redacted]w[redacted]d  Previous Scans:11  BIOPHYSICAL PROFILE:                                                                                                       COMMENTS GROSS BODY MOVEMENT                 2   TONE                2   RESPIRATIONS                2   AMNIOTIC FLUID                2                                                            SCORE:  8/8 (Note: NST was not performed as part of this antepartum testing)    DOPPLER FLOW STUDIES: UMBILICAL ARTERY RI RATIOS:   RI: 0.85, 0.83, 081  avg RI = 0.83 > 98%  no evidence of absent  end diastolic flow, no evidence of reverse flow                                                  Cord dopplers performed  with patient in Left decubitus position  ANATOMICAL SURVEY                                                                             COMMENTS                                     NASAL BONE yes normal        FACIAL PROFILE yes normal   4 CHAMBERED HEART yes normal   OUTFLOW TRACTS YES normaL             SITUS YES NORMAL        DIAPHRAGM yes normal   STOMACH yes normal   RENAL REGION yes normal   BLADDER yes normal             3 VESSEL CORD  Two vessel cord   SPINE yes normal   ARMS/HANDS yes normal   LEGS/FEET yes normal   GENITALIA yes normal female        SUSPECTED ABNORMALITIES:  Severe FGR  low normal range amn fluid 8.9%  MVP = 2.3 cm  QUALITY OF SCAN: satisfactory  TECHNICIAN COMMENTS:  US : GA = 36+2 weeks Single active female fetus, cephalic, FHR = 152 bpm  AFI = 8.7 cm, 8.9%, MVP  = 2.3 cm BPP = 8/8, excellent respirations observed - RI: 0.85, 0.83, 081  avg RI =  0.83 > 98% S/D = 5.96  PI = 1.56  -  no evidence of absent end diastolic flow, no  evidence of reverse flow  A copy of this report including all images has been saved and backed up to  a second source for retrieval if needed. All measures and details of the  anatomical scan, placentation, fluid volume and pelvic anatomy are  contained in that report.  Sharlet GORMAN Fair 08/09/2023 12:29 PM  Clinical Impression and recommendations:  I have reviewed the sonogram results above, combined with the patient's  current clinical course, below are my impressions and any appropriate  recommendations for management based on the sonographic findings.  1.  [id number] Estimated Date of Delivery: 09/04/23 by serial sonographic  evaluations   2.  Fetal sonographic surveillance findings:   a). Normal fluid volume b). Normal antepartum fetal assessment with BPP 8/8  c). Elevated fetal Doppler ratios with consistent diastolic flow:  98%  3.  Normal general sonographic findings  Recommend prenatal evaluations and care based on this sonogram and as  clinically indicated from the patient's clinical course.  Jennifer Ozan, DO Attending Obstetrician &  Gynecologist, [facility], [facility], [facility] for [employer], [facility]

## 2024-02-21 ENCOUNTER — Ambulatory Visit: Admitting: Oncology

## 2024-03-01 ENCOUNTER — Other Ambulatory Visit: Payer: Self-pay | Admitting: Nurse Practitioner

## 2024-03-08 ENCOUNTER — Inpatient Hospital Stay: Admitting: Oncology

## 2024-03-18 ENCOUNTER — Other Ambulatory Visit: Payer: Self-pay | Admitting: Nurse Practitioner

## 2024-03-27 ENCOUNTER — Other Ambulatory Visit: Payer: Self-pay

## 2024-03-27 DIAGNOSIS — C9001 Multiple myeloma in remission: Secondary | ICD-10-CM

## 2024-03-28 ENCOUNTER — Inpatient Hospital Stay: Attending: Hematology

## 2024-03-28 DIAGNOSIS — C9000 Multiple myeloma not having achieved remission: Secondary | ICD-10-CM | POA: Insufficient documentation

## 2024-03-28 DIAGNOSIS — C9001 Multiple myeloma in remission: Secondary | ICD-10-CM

## 2024-03-28 LAB — CBC WITH DIFFERENTIAL/PLATELET
Abs Immature Granulocytes: 0.01 K/uL (ref 0.00–0.07)
Basophils Absolute: 0 K/uL (ref 0.0–0.1)
Basophils Relative: 0 %
Eosinophils Absolute: 0.2 K/uL (ref 0.0–0.5)
Eosinophils Relative: 4 %
HCT: 41.1 % (ref 36.0–46.0)
Hemoglobin: 14.1 g/dL (ref 12.0–15.0)
Immature Granulocytes: 0 %
Lymphocytes Relative: 41 %
Lymphs Abs: 2.3 K/uL (ref 0.7–4.0)
MCH: 28.6 pg (ref 26.0–34.0)
MCHC: 34.3 g/dL (ref 30.0–36.0)
MCV: 83.4 fL (ref 80.0–100.0)
Monocytes Absolute: 0.4 K/uL (ref 0.1–1.0)
Monocytes Relative: 7 %
Neutro Abs: 2.7 K/uL (ref 1.7–7.7)
Neutrophils Relative %: 48 %
Platelets: 345 K/uL (ref 150–400)
RBC: 4.93 MIL/uL (ref 3.87–5.11)
RDW: 12.6 % (ref 11.5–15.5)
WBC: 5.6 K/uL (ref 4.0–10.5)
nRBC: 0 % (ref 0.0–0.2)

## 2024-03-28 LAB — COMPREHENSIVE METABOLIC PANEL WITH GFR
ALT: 37 U/L (ref 0–44)
AST: 26 U/L (ref 15–41)
Albumin: 4.8 g/dL (ref 3.5–5.0)
Alkaline Phosphatase: 71 U/L (ref 38–126)
Anion gap: 15 (ref 5–15)
BUN: 11 mg/dL (ref 6–20)
CO2: 22 mmol/L (ref 22–32)
Calcium: 9.7 mg/dL (ref 8.9–10.3)
Chloride: 99 mmol/L (ref 98–111)
Creatinine, Ser: 0.73 mg/dL (ref 0.44–1.00)
GFR, Estimated: >60 mL/min (ref >60)
Glucose, Bld: 295 mg/dL — ABNORMAL HIGH (ref 70–99)
Potassium: 3.8 mmol/L (ref 3.5–5.1)
Sodium: 136 mmol/L (ref 135–145)
Total Bilirubin: 0.4 mg/dL (ref 0.0–1.2)
Total Protein: 7.9 g/dL (ref 6.5–8.1)

## 2024-03-29 LAB — KAPPA/LAMBDA LIGHT CHAINS
Kappa free light chain: 11.8 mg/L (ref 3.3–19.4)
Kappa, lambda light chain ratio: 1.03 (ref 0.26–1.65)
Lambda free light chains: 11.5 mg/L (ref 5.7–26.3)

## 2024-03-30 LAB — IMMUNOFIXATION ELECTROPHORESIS
IgA: 118 mg/dL (ref 87–352)
IgG (Immunoglobin G), Serum: 1311 mg/dL (ref 586–1602)
IgM (Immunoglobulin M), Srm: 45 mg/dL (ref 26–217)
Total Protein ELP: 7.6 g/dL (ref 6.0–8.5)

## 2024-04-02 LAB — PROTEIN ELECTROPHORESIS, SERUM
A/G Ratio: 1.1 (ref 0.7–1.7)
Albumin ELP: 4 g/dL (ref 2.9–4.4)
Alpha-1-Globulin: 0.2 g/dL (ref 0.0–0.4)
Alpha-2-Globulin: 0.9 g/dL (ref 0.4–1.0)
Beta Globulin: 1.4 g/dL — ABNORMAL HIGH (ref 0.7–1.3)
Gamma Globulin: 1.2 g/dL (ref 0.4–1.8)
Globulin, Total: 3.7 g/dL (ref 2.2–3.9)
Total Protein ELP: 7.7 g/dL (ref 6.0–8.5)

## 2024-04-04 ENCOUNTER — Inpatient Hospital Stay (HOSPITAL_BASED_OUTPATIENT_CLINIC_OR_DEPARTMENT_OTHER): Admitting: Oncology

## 2024-04-04 ENCOUNTER — Encounter: Payer: Self-pay | Admitting: Family Medicine

## 2024-04-04 VITALS — BP 158/122 | HR 96 | Temp 97.6°F | Resp 19 | Ht 63.0 in | Wt 189.0 lb

## 2024-04-04 DIAGNOSIS — C9001 Multiple myeloma in remission: Secondary | ICD-10-CM | POA: Diagnosis not present

## 2024-04-04 DIAGNOSIS — C9000 Multiple myeloma not having achieved remission: Secondary | ICD-10-CM | POA: Diagnosis not present

## 2024-04-04 NOTE — Progress Notes (Signed)
 Patient Care Team: Diamond Annabella HERO, FNP as PCP - General (Family Medicine) Diamond Diamond SQUIBB, RN as Oncology Nurse Navigator (Oncology) Diamond Benton PARAS, NP as Nurse Practitioner (Endocrinology)  Clinic Day:  04/04/2024  Referring physician: Joesph Annabella HERO, FNP   CHIEF COMPLAINT:  CC:  Stage III IgA kappa multiple myeloma   Diamond Robbins 33 y.o. female was transferred to my care after her prior physician has left.   ASSESSMENT & PLAN:   Assessment & Plan: Diamond Robbins  is a 33 y.o. female with multiple myeloma  IgA kappa multiple myeloma R-ISS-stage III IgA kappa multiple myeloma s/p induction with Dara VRD followed by stem cell transplant at [facility] Extensive oncology history below Patient was on maintenance Revlimid  but was discontinued for pregnancy and breast-feeding.  - Discussed continuing patient off of Revlimid  until there is relapse of disease.  Patient should also be on 2 forms of reliable contraception for us  to restart her Revlimid  - Labs reviewed: CMP: Normal creatinine, normal LFTs.  CBC: Hemoglobin: 14.1 platelets: 345, SPEP: M spike: Not observed, free kappa light chain: 11.8, free lambda light chain: 11.5, ratio: 1.03, IFE: Unremarkable - Currently her disease is in remission.  Return to clinic in 3 months with labs.    The patient understands the plans discussed today and is in agreement with them.  She knows to contact our office if she develops concerns prior to her next appointment.  30 minutes of total time was spent for this patient encounter, including preparation,review of records,  face-to-face counseling with the patient and coordination of care, physical exam, and documentation of the encounter.    Diamond Robbins,acting as a neurosurgeon for Diamond Dry, MD.,have documented all relevant documentation on the behalf of Diamond Dry, MD,as directed by  Diamond Dry, MD while in the presence of Diamond Dry, MD.  I, Diamond Dry MD, have reviewed the above documentation for accuracy and completeness, and I agree with the above.    Diamond Robbins  [facility] [place] - A DEPT OF Diamond Robbins [facility] [address] [place] [address] Dept: [phone] Dept Fax: [phone]   No orders of the defined types were placed in this encounter.    ONCOLOGY HISTORY:   I have reviewed her chart and materials related to her cancer extensively and collaborated history with the patient. Summary of oncologic history is as follows:   Diagnosis:  Stage III IgA kappa multiple myeloma   -Presentation: Confusion and weakness in the setting of diabetic ketoacidosis, newly diagnosed DM type I, and renal failure with hypercalcemia.  -02/22/2020: CT chest: Moth-eaten appearance of the bones, best demonstrated within the sternum, ribs, and scapula.  -02/23/2020: US  abdomen: Normal spleen -02/24/2020: NM Bone Scan: Bone metastatic activity appears to be generally symmetric and normal appearing without definite focal area suggestive of neoplastic involvement.  -02/24/2020: US  Renal: Unremarkable.  -02/25/2020: Bone Marrow Biopsy.  Pathology: Extensive involvement by kappa restricted multiple myeloma (95%). Bone Marrow aspirate shows 80% plasma cells consistent with extensive involvement by multiple myeloma. Flow identified exapanded population of bright CD138+, aberrant CD56+ IgA kappa monoclonal plasma cells (12%).  Chromosome Analysis: Normal  Myeloma FISH Panel: Normal -03/11/2020: Immunofixation unremarkable. Beta-2  microglobulin normal. LDH elevated at 322. Calcium  8.0 -03/11/2020: Negative 24 hour urine immunofixation. M-spike negative -03/16/2020: SPEP: Unremarkable, M-spike not observed. KFLC 36.8 with FLC ratio of 2.85. -03/23/2020: Initial PET: No focal FDG radiotracer activity  to suggest hypermetabolic active multiple myeloma. No soft  tissue plasmacytoma. Diffuse multiple small lytic lesions within the pelvis, spine and vertebral bodies is a pattern typical of multiple myeloma. -03/31/2020 - 06/12/2020: 4 cycles of Dara VRD  -06/18/2020: SPEP: M-spike in the gamma region, estimated at 0.23. Immunofixation shows monoclonal IgG kappa. Low LFLC at 3.50 and FLC ratio of 1.85. LDH normal. -06/18/2020: Bone Marrow Biopsy.  Pathology: Hypercellular  marrow (90%) with normal trilineage hematopoiesis and 5-10% plasma cells. Blasts at 1%.  -09/10/2020: Bone Marrow Transplant at [facility]. -12/15/2020: Bone Marrow Biopsy.  Pathology:  Variably normocellular (30-70%) trilineage hematopoiesis with maturation. Hematogone hyperplasia (~10%). No increase in plasma cells. CD45 versus side scatter analysis demonstrate a predominance of maturing granulocytes (~63%), monocytes (~3%) and lymphocytes (~8%). B-cells represent about 9% of lymphocytes and have a kappa:lambda light chain ratio of 1.1:1. A distinct population of plasma cells is not identified   Chromosome Analysis: Normal  Myeloma FISH Panel: Normal MRD Negative. CR (per documentation) -12/08/2020 - 12/28/2022: Maintenance Revlimid  10 mg 3 weeks on/1 week off, held due to positive pregnancy test. Patient gave birth in April 2025 and breast fed for 3 months after.  -2024 - current: Myeloma labs remain undetectable  Current Treatment:  Surveillance  INTERVAL HISTORY:   Diamond Robbins is here today for follow up and to establish care with me for multiple myeloma. She reports doing well overall. Diamond Robbins did not have the chance to take her blood pressure medications today and denies any headaches, abdominal pain, or SOB.   Her labs since 08/2022 show her myeloma remains undetectable, and we discussed continuing to hold Revlimid  until her disease progresses. At this time, to be eligible for Revlimid  she must have 2 reliable forms birth control. She is currently  on birth control pills, though she is unsure what type, and her husband plans to have a vasectomy in January 2025.  She has no smoking history. She is doing well today with no complaints.   I have reviewed the past medical history, past surgical history, social history and family history with the patient and they are unchanged from previous note.  ALLERGIES:  has no known allergies.  MEDICATIONS:  Current Outpatient Medications  Medication Sig Dispense Refill   Blood Glucose Monitoring Suppl (ACCU-CHEK GUIDE) w/Device KIT      Blood Pressure Monitor KIT Use to check blood pressure as instructed 1 kit 0   calcium  carbonate (OS-CAL) 1250 (500 Ca) MG chewable tablet Chew 1 tablet by mouth daily.     cetirizine  (ZYRTEC ) 10 MG tablet Take 1 tablet by mouth once daily 30 tablet 0   cholecalciferol  (VITAMIN D3) 25 MCG (1000 UNIT) tablet Take 1,000 Units by mouth daily.     Continuous Glucose Sensor (DEXCOM G6 SENSOR) MISC USE TO CHECK GLUCOSE CONTINUOUSLY.  CHANGE SENSOR EVERY 10 DAYS AS DIRECTED. 9 each 0   Continuous Glucose Transmitter (DEXCOM G6 TRANSMITTER) MISC USE TO CHECK GLUCOSE CONTINOUSLY CHANGING  TRANSMITTER  EVERY  90  DAYS  AS  DIRECTED 1 each 0   fluticasone  (FLONASE ) 50 MCG/ACT nasal spray Place 2 sprays into both nostrils daily. 16 g 6   ibuprofen  (ADVIL ) 600 MG tablet Take 1 tablet (600 mg total) by mouth every 6 (six) hours. 30 each 0   Insulin  Disposable Pump (OMNIPOD 5 DEXG7G6 INTRO GEN 5) KIT by Does not apply route.     insulin  lispro (HUMALOG ) 100 UNIT/ML injection Use with Omnipod for TDD around  70 units daily. 60 mL 0   labetalol  (NORMODYNE ) 300 MG tablet Take 1 tablet (300 mg total) by mouth 2 (two) times daily. 60 tablet 6   lenalidomide  (REVLIMID ) 10 MG capsule Take 1 capsule (10 mg total) by mouth daily. 21 days on/7 days off 21 capsule 0   NIFEdipine  (PROCARDIA  XL) 30 MG 24 hr tablet Take 1 tablet (30 mg total) by mouth daily. 30 tablet 2   No current  facility-administered medications for this visit.    REVIEW OF SYSTEMS:   Constitutional: Denies fevers, chills or abnormal weight loss Eyes: Denies blurriness of vision Ears, nose, mouth, throat, and face: Denies mucositis or sore throat Respiratory: Denies cough, dyspnea or wheezes Cardiovascular: Denies palpitation, chest discomfort or lower extremity swelling Gastrointestinal:  Denies nausea, heartburn or change in bowel habits Skin: Denies abnormal skin rashes Lymphatics: Denies new lymphadenopathy or easy bruising Neurological:Denies numbness, tingling or new weaknesses Behavioral/Psych: Mood is stable, no new changes   All other systems were reviewed with the patient and are negative.   VITALS:  currently breastfeeding.  Wt Readings from Last 3 Encounters:  10/19/23 179 lb 7.3 oz (81.4 kg)  08/17/23 192 lb (87.1 kg)  08/09/23 197 lb 6.4 oz (89.5 kg)    There is no height or weight on file to calculate BMI.  Performance status (ECOG): 0 - Asymptomatic  PHYSICAL EXAM:   GENERAL:alert, no distress and comfortable SKIN: skin color, texture, turgor are normal, no rashes or significant lesions EYES: normal, Conjunctiva are pink and non-injected, sclera clear LYMPH:  no palpable lymphadenopathy in the cervical, axillary or inguinal LUNGS: clear to auscultation and percussion with normal breathing effort HEART: regular rate & rhythm and no murmurs and no lower extremity edema ABDOMEN:abdomen soft, non-tender and normal bowel sounds Musculoskeletal:no cyanosis of digits and no clubbing  NEURO: alert & oriented x 3 with fluent speech  LABORATORY DATA:  I have reviewed the data as listed   Lab Results  Component Value Date   WBC 5.6 03/28/2024   NEUTROABS 2.7 03/28/2024   HGB 14.1 03/28/2024   HCT 41.1 03/28/2024   MCV 83.4 03/28/2024   PLT 345 03/28/2024      Chemistry      Component Value Date/Time   NA 136 03/28/2024 1533   NA 138 02/18/2021 0934   K 3.8  03/28/2024 1533   CL 99 03/28/2024 1533   CO2 22 03/28/2024 1533   BUN 11 03/28/2024 1533   BUN 11 02/18/2021 0934   CREATININE 0.73 03/28/2024 1533      Component Value Date/Time   CALCIUM  9.7 03/28/2024 1533   ALKPHOS 71 03/28/2024 1533   AST 26 03/28/2024 1533   ALT 37 03/28/2024 1533   BILITOT 0.4 03/28/2024 1533   BILITOT 0.3 02/18/2021 0934       Latest Reference Range & Units 03/28/24 15:33  Total Protein ELP 6.0 - 8.5 g/dL 6.0 - 8.5 g/dL 7.7 7.6  Albumin ELP 2.9 - 4.4 g/dL 4.0  Globulin, Total 2.2 - 3.9 g/dL 3.7 (C)  A/G Ratio 0.7 - 1.7  1.1 (C)  Alpha-1-Globulin 0.0 - 0.4 g/dL 0.2  Joeyj-7-Honalopw 0.4 - 1.0 g/dL 0.9  Beta Globulin 0.7 - 1.3 g/dL 1.4 (H)  Gamma Globulin 0.4 - 1.8 g/dL 1.2  M-SPIKE, % Not Observed g/dL Not Observed  SPE Interp.  Comment  Comment  Comment  IgG (Immunoglobin G), Serum 586 - 1,602 mg/dL 8,688  IgM (Immunoglobulin M), Srm 26 - 217 mg/dL  45  IgA 87 - 352 mg/dL 881  (H): Data is abnormally high (C): Corrected   Latest Reference Range & Units 03/28/24 15:33  Kappa free light chain 3.3 - 19.4 mg/L 11.8  Lambda free light chains 5.7 - 26.3 mg/L 11.5  Kappa, lambda light chain ratio 0.26 - 1.65  1.03  Immunofixation Result, Serum  Comment (C)  (C): Corrected  RADIOGRAPHIC STUDIES: I have personally reviewed the radiological images as listed and agreed with the findings in the report.

## 2024-04-04 NOTE — Patient Instructions (Signed)

## 2024-04-15 ENCOUNTER — Telehealth: Payer: Self-pay

## 2024-04-15 ENCOUNTER — Other Ambulatory Visit (HOSPITAL_COMMUNITY): Payer: Self-pay

## 2024-04-15 ENCOUNTER — Encounter: Payer: Self-pay | Admitting: Nurse Practitioner

## 2024-04-15 NOTE — Telephone Encounter (Signed)
 Have you gotten any PA requests for her Dexcom G6 sensor and transmitter?

## 2024-04-15 NOTE — Telephone Encounter (Signed)
 Pharmacy Patient Advocate Encounter   Received notification from Pt Calls Messages that prior authorization for Dexcom G6 sensoe is required/requested.   Insurance verification completed.   The patient is insured through [facility].   Per test claim: PA required; However, NEW/RECENT labs/notes are needed to complete & submit PA request. Please see below.  Pt needs to have had an appointment in the last 6 months, along with updated A1C labs

## 2024-04-16 NOTE — Telephone Encounter (Signed)
 Noted

## 2024-04-16 NOTE — Telephone Encounter (Signed)
 She has appt on Thursday.  I told her this was a possibility.  She will just have to wait until Thursday when we have notes.  She still needs to bring readings to the appointment too.

## 2024-04-18 ENCOUNTER — Telehealth: Payer: Self-pay | Admitting: Nurse Practitioner

## 2024-04-18 ENCOUNTER — Ambulatory Visit: Admitting: Nurse Practitioner

## 2024-04-18 DIAGNOSIS — E782 Mixed hyperlipidemia: Secondary | ICD-10-CM

## 2024-04-18 DIAGNOSIS — E1065 Type 1 diabetes mellitus with hyperglycemia: Secondary | ICD-10-CM

## 2024-04-18 DIAGNOSIS — Z794 Long term (current) use of insulin: Secondary | ICD-10-CM

## 2024-04-18 DIAGNOSIS — I10 Essential (primary) hypertension: Secondary | ICD-10-CM

## 2024-04-18 MED ORDER — ACCU-CHEK GUIDE ME W/DEVICE KIT: PACK | 0 refills | Status: AC

## 2024-04-18 MED ORDER — INSULIN GLARGINE 100 UNIT/ML SOLOSTAR PEN: 20.0000 [IU] | PEN_INJECTOR | Freq: Every day | SUBCUTANEOUS | 0 refills | Status: AC

## 2024-04-18 MED ORDER — ACCU-CHEK GUIDE TEST VI STRP: ORAL_STRIP | 12 refills | Status: AC

## 2024-04-18 MED ORDER — INSULIN SYRINGE 29G X 1/2" 0.5 ML MISC: 3 refills | Status: AC

## 2024-04-18 MED ORDER — ACCU-CHEK SOFTCLIX LANCETS MISC: 12 refills | Status: AC

## 2024-04-18 MED ORDER — PEN NEEDLES 31G X 6 MM MISC: 3 refills | Status: AC

## 2024-04-18 NOTE — Telephone Encounter (Signed)
 I sent in Lantus  pens (pen needles to inject), insulin  syringes for the Lispro (just in case she didn't have any at home), a meter, strips, lancets all to [facility]

## 2024-04-18 NOTE — Telephone Encounter (Signed)
 Pt needs new meter kit sent to [facility] in [place]

## 2024-05-13 ENCOUNTER — Encounter: Payer: Self-pay | Admitting: *Deleted

## 2024-05-17 ENCOUNTER — Encounter: Payer: Self-pay | Admitting: Nurse Practitioner

## 2024-05-17 ENCOUNTER — Ambulatory Visit (INDEPENDENT_AMBULATORY_CARE_PROVIDER_SITE_OTHER): Admitting: Nurse Practitioner

## 2024-05-17 VITALS — BP 138/80 | HR 89 | Ht 63.0 in | Wt 186.6 lb

## 2024-05-17 DIAGNOSIS — E782 Mixed hyperlipidemia: Secondary | ICD-10-CM

## 2024-05-17 DIAGNOSIS — E1065 Type 1 diabetes mellitus with hyperglycemia: Secondary | ICD-10-CM

## 2024-05-17 DIAGNOSIS — Z794 Long term (current) use of insulin: Secondary | ICD-10-CM

## 2024-05-17 DIAGNOSIS — I10 Essential (primary) hypertension: Secondary | ICD-10-CM | POA: Diagnosis not present

## 2024-05-17 LAB — POCT GLYCOSYLATED HEMOGLOBIN (HGB A1C): Hemoglobin A1C: 11.3 % — AB (ref 4.0–5.6)

## 2024-05-17 MED ORDER — OMNIPOD 5 DEXG7G6 PODS GEN 5 MISC: 3 refills | Status: AC

## 2024-05-17 MED ORDER — INSULIN LISPRO 100 UNIT/ML IJ SOLN: INTRAMUSCULAR | 3 refills | Status: AC

## 2024-05-17 MED ORDER — DEXCOM G7 SENSOR MISC: 1.0000 | 3 refills | Status: AC

## 2024-05-17 NOTE — Progress Notes (Signed)
 "                                                                        Endocrinology Follow Up Note       05/17/2024, 11:14 AM   Subjective:    Patient ID: Diamond Robbins, female    DOB: Jun 29, 1990.  Diamond Robbins is being seen in follow up after being seen in consultation for management of currently uncontrolled symptomatic diabetes requested by  Joesph Annabella HERO, FNP.   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) 06/21/2021   Hypertension    Type 1 diabetes mellitus (HCC) 02/23/2020   Vitamin D  deficiency 06/21/2021    Past Surgical History:  Procedure Laterality Date   BONE MARROW TRANSPLANT     NO PAST SURGERIES      Social History   Socioeconomic History   Marital status: Married    Spouse name: [place]   Number of children: Not on file   Years of education: 12   Highest education level: High school graduate  Occupational History   Occupation: unemployed    Comment: can go back to work Jan 2023  Tobacco Use   Smoking status: Never    Passive exposure: Never   Smokeless tobacco: Never  Vaping Use   Vaping status: Never Used  Substance and Sexual Activity   Alcohol use: Not Currently    Comment: occ- 0-1 drinks per week; maybe 1 drink every 6 months; liquor and beer   Drug use: No   Sexual activity: Not Currently    Birth control/protection: Pill    Comment: FOB has a vasectomy scheduled in two weeks  Other Topics Concern   Not on file  Social History Narrative   Has 2 step-daughters   Social Drivers of Health   Tobacco Use: Low Risk (05/17/2024)   Patient History    Smoking Tobacco Use: Never    Smokeless Tobacco Use: Never    Passive Exposure: Never  Financial Resource Strain: Low Risk (02/28/2023)   Overall Financial Resource Strain (CARDIA)    Difficulty of Paying Living Expenses: Not hard at all  Food  Insecurity: No Food Insecurity (08/09/2023)   Hunger Vital Sign    Worried About Running Out of Food in the Last Year: Never true    Ran Out of Food in the Last Year: Never true  Transportation Needs: No Transportation Needs (08/09/2023)   PRAPARE - Administrator, Civil Service (Medical): No    Lack of Transportation (Non-Medical): No  Physical Activity: Insufficiently Active (02/28/2023)   Exercise Vital Sign    Days of Exercise per Week: 2 days    Minutes of Exercise per Session: 10 min  Stress: No Stress Concern Present (02/28/2023)   [employer] of Occupational Health - Occupational Stress Questionnaire    Feeling of Stress : Not at all  Social Connections: Socially Integrated (02/28/2023)   Social Connection and Isolation Panel    Frequency of Communication with Friends and Family: Three times a week    Frequency of Social Gatherings with Friends and Family: Once  a week    Attends Religious Services: More than 4 times per year    Active Member of Clubs or Organizations: Yes    Attends Banker Meetings: 1 to 4 times per year    Marital Status: Married  Depression (PHQ2-9): Low Risk (04/04/2024)   Depression (PHQ2-9)    PHQ-2 Score: 0  Alcohol Screen: Low Risk (02/28/2023)   Alcohol Screen    Last Alcohol Screening Score (AUDIT): 0  Housing: Low Risk (08/09/2023)   Housing Stability Vital Sign    Unable to Pay for Housing in the Last Year: No    Number of Times Moved in the Last Year: 0    Homeless in the Last Year: No  Utilities: Not At Risk (08/09/2023)   AHC Utilities    Threatened with loss of utilities: No  Health Literacy: Not on file    Family History  Problem Relation Age of Onset   Diabetes Paternal Grandfather    Hypertension Paternal Grandfather    Hypertension Paternal Grandmother    Other Paternal Grandmother    Diabetes Maternal Grandmother    Stroke Maternal Grandmother    Hypertension Maternal Grandmother    Heart attack  Maternal Grandfather    Diabetes Maternal Grandfather    Hypertension Maternal Grandfather    Diabetes Father    Hypertension Father    Miscarriages / Stillbirths Mother    Hypertension Mother     Outpatient Encounter Medications as of 05/17/2024  Medication Sig   Accu-Chek Softclix Lancets lancets Use as instructed to monitor glucose 4 times daily   Blood Glucose Monitoring Suppl (ACCU-CHEK GUIDE ME) w/Device KIT Use to check glucose 4 times daily   Blood Pressure Monitor KIT Use to check blood pressure as instructed   calcium  carbonate (OS-CAL) 1250 (500 Ca) MG chewable tablet Chew 1 tablet by mouth daily.   cetirizine  (ZYRTEC ) 10 MG tablet Take 1 tablet by mouth once daily   cholecalciferol  (VITAMIN D3) 25 MCG (1000 UNIT) tablet Take 1,000 Units by mouth daily.   Continuous Glucose Sensor (DEXCOM G7 SENSOR) MISC Inject 1 Application into the skin as directed. Change sensor every 10 days as directed.   fluticasone  (FLONASE ) 50 MCG/ACT nasal spray Place 2 sprays into both nostrils daily.   glucose blood (ACCU-CHEK GUIDE TEST) test strip Use as instructed to monitor glucose 4 times daily   ibuprofen  (ADVIL ) 600 MG tablet Take 1 tablet (600 mg total) by mouth every 6 (six) hours.   insulin  glargine (LANTUS ) 100 UNIT/ML Solostar Pen Inject 20 Units into the skin at bedtime. (Patient taking differently: Inject 40 Units into the skin at bedtime.)   Insulin  Pen Needle (PEN NEEDLES) 31G X 6 MM MISC Use to inject insulin  once daily   INSULIN  SYRINGE .5CC/29G 29G X 1/2 0.5 ML MISC Use to inject insulin  3 times daily   labetalol  (NORMODYNE ) 300 MG tablet Take 1 tablet (300 mg total) by mouth 2 (two) times daily. (Patient taking differently: Take 300 mg by mouth 2 (two) times daily. Taking 150mg  now that she is no longer pregnant)   lenalidomide  (REVLIMID ) 10 MG capsule Take 1 capsule (10 mg total) by mouth daily. 21 days on/7 days off   NIFEdipine  (PROCARDIA  XL) 30 MG 24 hr tablet Take 1 tablet (30  mg total) by mouth daily.   norethindrone  (MICRONOR ) 0.35 MG tablet Take 1 tablet by mouth daily.   [DISCONTINUED] insulin  lispro (HUMALOG ) 100 UNIT/ML injection Use with Omnipod for TDD around 70 units  daily. (Patient taking differently: 5-8 Units. Patient is currently  injecting 5-8 units three times a day with meals.)   Insulin  Disposable Pump (OMNIPOD 5 DEXG7G6 PODS GEN 5) MISC Change pod every 48-72 hours as directed   insulin  lispro (HUMALOG ) 100 UNIT/ML injection Use with Omnipod for TDD around 70 units daily.   [DISCONTINUED] Continuous Glucose Sensor (DEXCOM G6 SENSOR) MISC USE TO CHECK GLUCOSE CONTINUOUSLY.  CHANGE SENSOR EVERY 10 DAYS AS DIRECTED. (Patient not taking: Reported on 05/17/2024)   [DISCONTINUED] Continuous Glucose Transmitter (DEXCOM G6 TRANSMITTER) MISC USE TO CHECK GLUCOSE CONTINOUSLY CHANGING  TRANSMITTER  EVERY  90  DAYS  AS  DIRECTED (Patient not taking: Reported on 05/17/2024)   [DISCONTINUED] Insulin  Disposable Pump (OMNIPOD 5 DEXG7G6 INTRO GEN 5) KIT by Does not apply route. (Patient not taking: Reported on 05/17/2024)   [DISCONTINUED] Insulin  Disposable Pump (OMNIPOD 5 DEXG7G6 PODS GEN 5) MISC Inject into the skin. (Patient not taking: Reported on 05/17/2024)   No facility-administered encounter medications on file as of 05/17/2024.    ALLERGIES: No Known Allergies  VACCINATION STATUS: Immunization History  Administered Date(s) Administered   DTaP / HiB / IPV 03/24/2021   DTaP / IPV 07/16/2021, 09/16/2021   HIB (PRP-T) 07/16/2021, 09/16/2021   Hepatitis B 03/24/2021   Hepatitis B, ADULT 03/24/2021, 07/16/2021, 12/01/2021   Influenza, Seasonal, Injecte, Preservative Fre 05/01/2023   Influenza,inj,Quad PF,6+ Mos 03/16/2021   MMR 10/19/2023   Pneumococcal Conjugate-13 03/24/2021, 07/16/2021, 09/16/2021   Pneumococcal Polysaccharide-23 12/01/2021   Tdap 07/17/2023    Diabetes She presents for her follow-up diabetic visit. She has type 1 diabetes mellitus. Onset  time: Diagnosed at approx age of 55. Her disease course has been improving. There are no hypoglycemic associated symptoms. Associated symptoms include fatigue. Pertinent negatives for diabetes include no polydipsia, no polyuria and no weight loss. There are no hypoglycemic complications. Symptoms are improving. There are no diabetic complications. Risk factors for coronary artery disease include diabetes mellitus, dyslipidemia, hypertension and sedentary lifestyle. Current diabetic treatment includes insulin  pump (OBGYN also started on NPH). She is compliant with treatment most of the time. Her weight is fluctuating minimally. She is following a generally healthy diet. When asked about meal planning, she reported none. She has not had a previous visit with a dietitian. She participates in exercise intermittently. Her home blood glucose trend is increasing steadily. (She presents today after long absence with her meter showing fluctuating glycemic profile with above target readings overall.  Her POCT A1c today is 11.3%, increasing from last visit of 6.6%.  She has not been able to get refills of her CGM due to needing OV first.  She has been performing MDI rather than using her Omnipod for same reason.  She denies any hypoglycemia.) An ACE inhibitor/angiotensin II receptor blocker is not being taken. She does not see a podiatrist.Eye exam is current.    Review of systems  Constitutional: + Minimally fluctuating body weight,  current Body mass index is 33.05 kg/m. , no fatigue, no subjective hyperthermia, no subjective hypothermia Eyes: no blurry vision, no xerophthalmia ENT: no sore throat, no nodules palpated in throat, no dysphagia/odynophagia, no hoarseness Cardiovascular: no chest pain, no shortness of breath, no palpitations, no leg swelling Respiratory: no cough, no shortness of breath Gastrointestinal: no nausea/vomiting/diarrhea Musculoskeletal: no muscle/joint aches Skin: no rashes, no  hyperemia Neurological: no tremors, no numbness, no tingling, no dizziness Psychiatric: no depression, no anxiety  Objective:     BP 138/80 (BP Location: Left Arm, Patient  Position: Sitting, Cuff Size: Large)   Pulse 89   Ht 5' 3 (1.6 m)   Wt 186 lb 9.6 oz (84.6 kg)   Breastfeeding No   BMI 33.05 kg/m   Wt Readings from Last 3 Encounters:  05/17/24 186 lb 9.6 oz (84.6 kg)  04/04/24 189 lb (85.7 kg)  10/19/23 179 lb 7.3 oz (81.4 kg)     BP Readings from Last 3 Encounters:  05/17/24 138/80  04/04/24 (!) 158/122  10/19/23 (!) 132/103      Physical Exam- Limited  Constitutional:  Body mass index is 33.05 kg/m. , not in acute distress, normal state of mind Eyes:  EOMI, no exophthalmos Musculoskeletal: no gross deformities, strength intact in all four extremities, no gross restriction of joint movements Skin:  no rashes, no hyperemia Neurological: no tremor with outstretched hands   Diabetic Foot Exam - Simple   Simple Foot Form Diabetic Foot exam was performed with the following findings: Yes 05/17/2024 11:09 AM  Visual Inspection No deformities, no ulcerations, no other skin breakdown bilaterally: Yes Sensation Testing Intact to touch and monofilament testing bilaterally: Yes Pulse Check Posterior Tibialis and Dorsalis pulse intact bilaterally: Yes Comments     CMP ( most recent) CMP     Component Value Date/Time   NA 136 03/28/2024 1533   NA 138 02/18/2021 0934   K 3.8 03/28/2024 1533   CL 99 03/28/2024 1533   CO2 22 03/28/2024 1533   GLUCOSE 295 (H) 03/28/2024 1533   BUN 11 03/28/2024 1533   BUN 11 02/18/2021 0934   CREATININE 0.73 03/28/2024 1533   CALCIUM  9.7 03/28/2024 1533   PROT 7.9 03/28/2024 1533   PROT 7.7 02/18/2021 0934   ALBUMIN 4.8 03/28/2024 1533   ALBUMIN 5.2 (H) 02/18/2021 0934   AST 26 03/28/2024 1533   ALT 37 03/28/2024 1533   ALKPHOS 71 03/28/2024 1533   BILITOT 0.4 03/28/2024 1533   BILITOT 0.3 02/18/2021 0934   GFRNONAA >60  03/28/2024 1533     Diabetic Labs (most recent): Lab Results  Component Value Date   HGBA1C 11.3 (A) 05/17/2024   HGBA1C 6.6 (A) 06/16/2023   HGBA1C 8.5 (A) 01/25/2023   MICROALBUR 150mg /L 07/19/2022   MICROALBUR 150 06/24/2021     Lipid Panel ( most recent) Lipid Panel     Component Value Date/Time   CHOL 158 06/21/2021 1011   TRIG 125 06/21/2021 1011   HDL 62 06/21/2021 1011   CHOLHDL 2.5 06/21/2021 1011   LDLCALC 74 06/21/2021 1011   LABVLDL 22 06/21/2021 1011      Lab Results  Component Value Date   TSH 2.450 02/28/2023   TSH 2.430 02/18/2021           Assessment & Plan:   1) Type 1 diabetes mellitus without complications (HCC)  She presents today after long absence with her meter showing fluctuating glycemic profile with above target readings overall.  Her POCT A1c today is 11.3%, increasing from last visit of 6.6%.  She has not been able to get refills of her CGM due to needing OV first.  She has been performing MDI rather than using her Omnipod for same reason.  She denies any hypoglycemia.  - Nelwyn Hebdon has currently uncontrolled symptomatic type 2 DM since 34 years of age.   -Recent labs reviewed.    - I had a long discussion with her about the progressive nature of diabetes and the pathology behind its complications. -her diabetes is not currently complicated  but she remains at a high risk for more acute and chronic complications which include CAD, CVA, CKD, retinopathy, and neuropathy. These are all discussed in detail with her.  The following Lifestyle Medicine recommendations according to American College of Lifestyle Medicine [facility]) were discussed and offered to patient and she agrees to start the journey:  A. Whole Foods, Plant-based plate comprising of fruits and vegetables, plant-based proteins, whole-grain carbohydrates was discussed in detail with the patient.   A list for source of those nutrients were also provided to the patient.  Patient  will use only water or unsweetened tea for hydration. B.  The need to stay away from risky substances including alcohol, smoking; obtaining 7 to 9 hours of restorative sleep, at least 150 minutes of moderate intensity exercise weekly, the importance of healthy social connections,  and stress reduction techniques were discussed. C.  A full color page of  Calorie density of various food groups per pound showing examples of each food groups was provided to the patient.  - Nutritional counseling repeated/built upon at each appointment.  - The patient admits there is a room for improvement in their diet and drink choices. -  Suggestion is made for the patient to avoid simple carbohydrates from their diet including Cakes, Sweet Desserts / Pastries, Ice Cream, Soda (diet and regular), Sweet Tea, Candies, Chips, Cookies, Sweet Pastries, Store Bought Juices, Alcohol in Excess of 1-2 drinks a day, Artificial Sweeteners, Coffee Creamer, and Sugar-free Products. This will help patient to have stable blood glucose profile and potentially avoid unintended weight gain.   - I encouraged the patient to switch to unprocessed or minimally processed complex starch and increased protein intake (animal or plant source), fruits, and vegetables.   - Patient is advised to stick to a routine mealtimes to eat 3 meals a day and avoid unnecessary snacks (to snack only to correct hypoglycemia).  - I have approached her with the following individualized plan to manage her diabetes and patient agrees:   -Will transition patient back to using her Omnipod 5 with Dexcom G7 (upgrading from the G6).  Until those can get approved at the pharmacy, she is to continue Lantus  40 units nightly and Novolog  5-7 units TID with meals if glucose is above 90 and she is eating per sliding scale.  -she is encouraged to continue monitoring glucose 4 times daily (using her CGM or back up meter), before meals and before bed, and to call the clinic if  she has readings less than 70 or above 300 for 3 tests in a row.    - she is warned not to take insulin  without proper monitoring per orders. - Adjustment parameters are given to her for hypo and hyperglycemia in writing.  -Given her type 1 diagnosis, insulin  is the only treatment option for her.  - Specific targets for  A1c; LDL, HDL, and Triglycerides were discussed with the patient.  2) Blood Pressure /Hypertension:  her blood pressure is controlled to target.   she is advised to continue her current medications prescribed by her OBGYN.  3) Lipids/Hyperlipidemia:    Review of her recent lipid panel from 06/21/21 showed controlled LDL at 74.  she is advised to continue Crestor  10 mg daily at bedtime.  Side effects and precautions discussed with her.  Will recheck lipid panel prior to next visit.  4)  Weight/Diet:  her Body mass index is 33.05 kg/m.  -  clearly complicating her diabetes care.   she is a  candidate for some weight loss. I discussed with her the fact that loss of 5 - 10% of her  current body weight will have the most impact on her diabetes management.  Exercise, and detailed carbohydrates information provided  -  detailed on discharge instructions.  5) Chronic Care/Health Maintenance: -she is not on ACEI/ARB and or Statin medications (due to pregnancy) and is encouraged to initiate and continue to follow up with Ophthalmology, Dentist, Podiatrist at least yearly or according to recommendations, and advised to stay away from smoking. I have recommended yearly flu vaccine and pneumonia vaccine at least every 5 years; moderate intensity exercise for up to 150 minutes weekly; and sleep for at least 7 hours a day.  - she is advised to maintain close follow up with Joesph Annabella HERO, FNP for primary care needs, as well as her other providers for optimal and coordinated care.     I spent  48  minutes in the care of the patient today including review of labs from CMP, Lipids, Thyroid   Function, Hematology (current and previous including abstractions from other facilities); face-to-face time discussing  her blood glucose readings/logs, discussing hypoglycemia and hyperglycemia episodes and symptoms, medications doses, her options of short and long term treatment based on the latest standards of care / guidelines;  discussion about incorporating lifestyle medicine;  and documenting the encounter. Risk reduction counseling performed per USPSTF guidelines to reduce obesity and cardiovascular risk factors.     Please refer to Patient Instructions for Blood Glucose Monitoring and Insulin /Medications Dosing Guide  in media tab for additional information. Please  also refer to  Patient Self Inventory in the Media  tab for reviewed elements of pertinent patient history.  Diamond Robbins participated in the discussions, expressed understanding, and voiced agreement with the above plans.  All questions were answered to her satisfaction. she is encouraged to contact clinic should she have any questions or concerns prior to her return visit.    Follow up plan: - Return in about 3 months (around 08/15/2024) for Diabetes F/U with A1c in office, Bring meter and logs, Previsit labs.   Benton Rio, [facility] [address] [place], [place] [address] Phone: [phone] Fax: [phone]  05/17/2024, 11:14 AM    "

## 2024-05-20 ENCOUNTER — Telehealth: Payer: Self-pay

## 2024-05-20 ENCOUNTER — Other Ambulatory Visit (HOSPITAL_COMMUNITY): Payer: Self-pay

## 2024-05-20 NOTE — Telephone Encounter (Signed)
 Patient needs PA for Dexcom G7.

## 2024-05-20 NOTE — Telephone Encounter (Signed)
 Pharmacy Patient Advocate Encounter   Received notification from Pt Calls Messages that prior authorization for Dexcom G7 sensor is required/requested.   Insurance verification completed.   The patient is insured through [facility].   Per test claim: PA required; PA submitted to above mentioned insurance via Latent Key/confirmation #/[facility] Status is pending

## 2024-05-22 ENCOUNTER — Other Ambulatory Visit (HOSPITAL_COMMUNITY): Payer: Self-pay

## 2024-05-22 NOTE — Telephone Encounter (Signed)
 Called the patient but her voice mailbox has not been set up. Will try again later.

## 2024-05-22 NOTE — Telephone Encounter (Signed)
 Patient was called and her VM was working, so a voice message was left letting her know that the sensors had been approved.

## 2024-05-22 NOTE — Telephone Encounter (Signed)
 Pharmacy Patient Advocate Encounter  Received notification from [facility] MEDICAID that Prior Authorization for Dexcom G7 Sensor  has been APPROVED from 05/20/24 to 11/16/24. Unable to obtain price due to refill too soon rejection, last fill date 05/21/24 next available fill date 07/27/24   PA #/Case ID/Reference #: [id number]

## 2024-06-03 ENCOUNTER — Encounter: Admitting: Family Medicine

## 2024-06-27 ENCOUNTER — Inpatient Hospital Stay: Attending: Hematology

## 2024-07-05 ENCOUNTER — Inpatient Hospital Stay: Admitting: Oncology

## 2024-08-16 ENCOUNTER — Ambulatory Visit: Admitting: Nurse Practitioner

## 2024-09-27 ENCOUNTER — Encounter: Admitting: Family Medicine
